# Patient Record
Sex: Female | Born: 1970 | Race: Black or African American | Hispanic: No | Marital: Married | Smoking: Never smoker
Health system: Southern US, Community
[De-identification: ages and names within clinical notes are randomized; demographics above are authoritative.]

## PROBLEM LIST (undated history)

## (undated) DIAGNOSIS — K5909 Other constipation: Secondary | ICD-10-CM

## (undated) DIAGNOSIS — H524 Presbyopia: Secondary | ICD-10-CM

## (undated) DIAGNOSIS — E8881 Metabolic syndrome: Secondary | ICD-10-CM

## (undated) DIAGNOSIS — N6310 Unspecified lump in the right breast, unspecified quadrant: Secondary | ICD-10-CM

## (undated) DIAGNOSIS — R011 Cardiac murmur, unspecified: Secondary | ICD-10-CM

## (undated) DIAGNOSIS — M722 Plantar fascial fibromatosis: Secondary | ICD-10-CM

## (undated) DIAGNOSIS — F329 Major depressive disorder, single episode, unspecified: Secondary | ICD-10-CM

## (undated) DIAGNOSIS — M1990 Unspecified osteoarthritis, unspecified site: Secondary | ICD-10-CM

## (undated) DIAGNOSIS — I10 Essential (primary) hypertension: Secondary | ICD-10-CM

## (undated) DIAGNOSIS — H4010X1 Unspecified open-angle glaucoma, mild stage: Secondary | ICD-10-CM

## (undated) DIAGNOSIS — L732 Hidradenitis suppurativa: Secondary | ICD-10-CM

## (undated) DIAGNOSIS — K436 Other and unspecified ventral hernia with obstruction, without gangrene: Secondary | ICD-10-CM

## (undated) DIAGNOSIS — H52209 Unspecified astigmatism, unspecified eye: Secondary | ICD-10-CM

## (undated) DIAGNOSIS — T7840XA Allergy, unspecified, initial encounter: Secondary | ICD-10-CM

## (undated) DIAGNOSIS — M7120 Synovial cyst of popliteal space [Baker], unspecified knee: Secondary | ICD-10-CM

## (undated) DIAGNOSIS — L680 Hirsutism: Secondary | ICD-10-CM

## (undated) DIAGNOSIS — M797 Fibromyalgia: Secondary | ICD-10-CM

## (undated) DIAGNOSIS — H5210 Myopia, unspecified eye: Secondary | ICD-10-CM

## (undated) DIAGNOSIS — R29818 Other symptoms and signs involving the nervous system: Secondary | ICD-10-CM

## (undated) DIAGNOSIS — R7303 Prediabetes: Secondary | ICD-10-CM

## (undated) DIAGNOSIS — G4733 Obstructive sleep apnea (adult) (pediatric): Secondary | ICD-10-CM

## (undated) DIAGNOSIS — E669 Obesity, unspecified: Secondary | ICD-10-CM

## (undated) DIAGNOSIS — D649 Anemia, unspecified: Secondary | ICD-10-CM

## (undated) DIAGNOSIS — F32A Depression, unspecified: Secondary | ICD-10-CM

## (undated) DIAGNOSIS — H401231 Low-tension glaucoma, bilateral, mild stage: Secondary | ICD-10-CM

## (undated) DIAGNOSIS — I493 Ventricular premature depolarization: Secondary | ICD-10-CM

## (undated) HISTORY — DX: Other constipation: K59.09

## (undated) HISTORY — DX: Plantar fascial fibromatosis: M72.2

## (undated) HISTORY — DX: Major depressive disorder, single episode, unspecified: F32.9

## (undated) HISTORY — DX: Fibromyalgia: M79.7

## (undated) HISTORY — DX: Essential (primary) hypertension: I10

## (undated) HISTORY — DX: Metabolic syndrome: E88.810

## (undated) HISTORY — DX: Other and unspecified ventral hernia with obstruction, without gangrene: K43.6

## (undated) HISTORY — DX: Other symptoms and signs involving the nervous system: R29.818

## (undated) HISTORY — DX: Presbyopia: H52.4

## (undated) HISTORY — DX: Unspecified open-angle glaucoma, mild stage: H40.10X1

## (undated) HISTORY — DX: Ventricular premature depolarization: I49.3

## (undated) HISTORY — DX: Allergy, unspecified, initial encounter: T78.40XA

## (undated) HISTORY — DX: Hidradenitis suppurativa: L73.2

## (undated) HISTORY — DX: Obesity, unspecified: E66.9

## (undated) HISTORY — DX: Cardiac murmur, unspecified: R01.1

## (undated) HISTORY — DX: Depression, unspecified: F32.A

## (undated) HISTORY — DX: Unspecified astigmatism, unspecified eye: H52.209

## (undated) HISTORY — DX: Hirsutism: L68.0

## (undated) HISTORY — DX: Low-tension glaucoma, bilateral, mild stage: H40.1231

## (undated) HISTORY — DX: Metabolic syndrome: E88.81

## (undated) HISTORY — DX: Synovial cyst of popliteal space (Baker), unspecified knee: M71.20

## (undated) HISTORY — DX: Unspecified lump in the right breast, unspecified quadrant: N63.10

## (undated) HISTORY — DX: Myopia, unspecified eye: H52.10

## (undated) HISTORY — DX: Obstructive sleep apnea (adult) (pediatric): G47.33

## (undated) HISTORY — DX: Anemia, unspecified: D64.9

---

## 1991-02-21 HISTORY — PX: TUBAL LIGATION: SHX77

## 2004-11-07 ENCOUNTER — Other Ambulatory Visit: Payer: Self-pay

## 2004-11-07 ENCOUNTER — Emergency Department: Payer: Self-pay | Admitting: Emergency Medicine

## 2004-11-09 ENCOUNTER — Emergency Department: Payer: Self-pay | Admitting: Emergency Medicine

## 2006-11-17 ENCOUNTER — Emergency Department: Payer: Self-pay | Admitting: Emergency Medicine

## 2007-09-12 ENCOUNTER — Ambulatory Visit: Payer: Self-pay

## 2007-09-25 ENCOUNTER — Ambulatory Visit: Payer: Self-pay

## 2008-01-17 ENCOUNTER — Emergency Department: Payer: Self-pay | Admitting: Unknown Physician Specialty

## 2008-04-24 ENCOUNTER — Emergency Department (HOSPITAL_COMMUNITY): Admission: EM | Admit: 2008-04-24 | Discharge: 2008-04-24 | Payer: Self-pay | Admitting: Emergency Medicine

## 2009-05-21 ENCOUNTER — Ambulatory Visit: Payer: Self-pay | Admitting: Gynecologic Oncology

## 2009-05-25 ENCOUNTER — Ambulatory Visit: Payer: Self-pay

## 2009-06-01 ENCOUNTER — Ambulatory Visit: Payer: Self-pay | Admitting: Gynecologic Oncology

## 2009-06-20 ENCOUNTER — Ambulatory Visit: Payer: Self-pay | Admitting: Gynecologic Oncology

## 2009-06-21 ENCOUNTER — Ambulatory Visit: Payer: Self-pay

## 2009-08-18 ENCOUNTER — Ambulatory Visit: Payer: Self-pay | Admitting: Gynecologic Oncology

## 2009-08-24 ENCOUNTER — Ambulatory Visit: Payer: Self-pay | Admitting: Gynecologic Oncology

## 2009-08-24 ENCOUNTER — Inpatient Hospital Stay: Payer: Self-pay

## 2009-08-26 LAB — PATHOLOGY REPORT

## 2009-09-20 ENCOUNTER — Ambulatory Visit: Payer: Self-pay | Admitting: Gynecologic Oncology

## 2009-12-09 DIAGNOSIS — L689 Hypertrichosis, unspecified: Secondary | ICD-10-CM

## 2009-12-09 DIAGNOSIS — I10 Essential (primary) hypertension: Secondary | ICD-10-CM

## 2009-12-09 DIAGNOSIS — E668 Other obesity: Secondary | ICD-10-CM

## 2009-12-09 HISTORY — PX: OOPHORECTOMY: SHX86

## 2010-01-18 ENCOUNTER — Ambulatory Visit: Payer: Self-pay

## 2010-01-18 DIAGNOSIS — N6310 Unspecified lump in the right breast, unspecified quadrant: Secondary | ICD-10-CM

## 2010-01-18 HISTORY — DX: Unspecified lump in the right breast, unspecified quadrant: N63.10

## 2010-10-25 ENCOUNTER — Ambulatory Visit: Payer: Self-pay

## 2011-01-24 ENCOUNTER — Ambulatory Visit: Payer: Self-pay | Admitting: General Practice

## 2011-02-21 ENCOUNTER — Ambulatory Visit: Payer: Self-pay | Admitting: General Practice

## 2011-09-29 DIAGNOSIS — K436 Other and unspecified ventral hernia with obstruction, without gangrene: Secondary | ICD-10-CM

## 2011-09-29 HISTORY — DX: Other and unspecified ventral hernia with obstruction, without gangrene: K43.6

## 2011-10-05 LAB — URINALYSIS, COMPLETE
Bacteria: NONE SEEN
Bilirubin,UR: NEGATIVE
Blood: NEGATIVE
Glucose,UR: 50 mg/dL (ref 0–75)
Ketone: NEGATIVE
Leukocyte Esterase: NEGATIVE
Specific Gravity: 1.019 (ref 1.003–1.030)
Squamous Epithelial: 6
WBC UR: 3 /HPF (ref 0–5)

## 2011-10-05 LAB — LIPASE, BLOOD: Lipase: 106 U/L (ref 73–393)

## 2011-10-05 LAB — CBC
HGB: 11.2 g/dL — ABNORMAL LOW (ref 12.0–16.0)
MCH: 23.7 pg — ABNORMAL LOW (ref 26.0–34.0)
MCHC: 30.7 g/dL — ABNORMAL LOW (ref 32.0–36.0)
MCV: 77 fL — ABNORMAL LOW (ref 80–100)
Platelet: 340 10*3/uL (ref 150–440)
RDW: 17.8 % — ABNORMAL HIGH (ref 11.5–14.5)

## 2011-10-05 LAB — COMPREHENSIVE METABOLIC PANEL
Alkaline Phosphatase: 75 U/L (ref 50–136)
Anion Gap: 6 — ABNORMAL LOW (ref 7–16)
BUN: 10 mg/dL (ref 7–18)
Calcium, Total: 8.6 mg/dL (ref 8.5–10.1)
Co2: 30 mmol/L (ref 21–32)
Creatinine: 0.76 mg/dL (ref 0.60–1.30)
EGFR (African American): >60
EGFR (Non-African Amer.): >60
Glucose: 210 mg/dL — ABNORMAL HIGH (ref 65–99)
Osmolality: 288 (ref 275–301)
Potassium: 3.6 mmol/L (ref 3.5–5.1)
SGPT (ALT): 17 U/L (ref 12–78)

## 2011-10-06 ENCOUNTER — Inpatient Hospital Stay: Payer: Self-pay | Admitting: Surgery

## 2011-10-07 LAB — BASIC METABOLIC PANEL
Anion Gap: 9 (ref 7–16)
BUN: 6 mg/dL — ABNORMAL LOW (ref 7–18)
Co2: 25 mmol/L (ref 21–32)
Creatinine: 0.53 mg/dL — ABNORMAL LOW (ref 0.60–1.30)
EGFR (African American): >60
Glucose: 101 mg/dL — ABNORMAL HIGH (ref 65–99)

## 2011-10-07 LAB — CBC WITH DIFFERENTIAL/PLATELET
Basophil #: 0.1 10*3/uL (ref 0.0–0.1)
Eosinophil %: 3.6 %
Lymphocyte #: 3 10*3/uL (ref 1.0–3.6)
Monocyte %: 7 %
Neutrophil #: 7.6 10*3/uL — ABNORMAL HIGH (ref 1.4–6.5)
Neutrophil %: 63.6 %
RBC: 4.62 10*6/uL (ref 3.80–5.20)
RDW: 17.2 % — ABNORMAL HIGH (ref 11.5–14.5)
WBC: 11.9 10*3/uL — ABNORMAL HIGH (ref 3.6–11.0)

## 2011-10-09 HISTORY — PX: HERNIA REPAIR: SHX51

## 2011-10-09 HISTORY — PX: VENTRAL HERNIA REPAIR: SHX424

## 2011-10-12 LAB — PLATELET COUNT: Platelet: 363 10*3/uL (ref 150–440)

## 2012-07-26 ENCOUNTER — Ambulatory Visit: Payer: Self-pay | Admitting: Hematology and Oncology

## 2012-08-09 ENCOUNTER — Ambulatory Visit: Payer: Self-pay | Admitting: Hematology and Oncology

## 2012-08-09 LAB — CBC CANCER CENTER
Eosinophil #: 0.4 x10 3/mm (ref 0.0–0.7)
Eosinophil %: 3.3 %
HCT: 35.9 % (ref 35.0–47.0)
HGB: 11.7 g/dL — ABNORMAL LOW (ref 12.0–16.0)
MCH: 24.3 pg — ABNORMAL LOW (ref 26.0–34.0)
MCHC: 32.5 g/dL (ref 32.0–36.0)
MCV: 75 fL — ABNORMAL LOW (ref 80–100)
Monocyte #: 0.9 x10 3/mm (ref 0.2–0.9)
Neutrophil #: 6.8 x10 3/mm — ABNORMAL HIGH (ref 1.4–6.5)
Neutrophil %: 58.8 %
Platelet: 373 x10 3/mm (ref 150–440)
RBC: 4.79 10*6/uL (ref 3.80–5.20)

## 2012-08-20 ENCOUNTER — Ambulatory Visit: Payer: Self-pay | Admitting: Hematology and Oncology

## 2012-11-01 ENCOUNTER — Emergency Department: Payer: Self-pay | Admitting: Emergency Medicine

## 2012-11-01 LAB — URINALYSIS, COMPLETE
Glucose,UR: NEGATIVE mg/dL (ref 0–75)
Ketone: NEGATIVE
Nitrite: NEGATIVE
Ph: 5 (ref 4.5–8.0)
Protein: 30
RBC,UR: 2 /HPF (ref 0–5)
Squamous Epithelial: 7
WBC UR: 2 /HPF (ref 0–5)

## 2012-11-01 LAB — COMPREHENSIVE METABOLIC PANEL
Albumin: 3.5 g/dL (ref 3.4–5.0)
Alkaline Phosphatase: 83 U/L (ref 50–136)
Co2: 27 mmol/L (ref 21–32)
Creatinine: 0.62 mg/dL (ref 0.60–1.30)
Osmolality: 273 (ref 275–301)
SGOT(AST): 17 U/L (ref 15–37)
SGPT (ALT): 20 U/L (ref 12–78)
Total Protein: 7.9 g/dL (ref 6.4–8.2)

## 2012-11-01 LAB — CBC
MCH: 24.4 pg — ABNORMAL LOW (ref 26.0–34.0)
MCHC: 31.9 g/dL — ABNORMAL LOW (ref 32.0–36.0)
MCV: 76 fL — ABNORMAL LOW (ref 80–100)
Platelet: 430 10*3/uL (ref 150–440)

## 2012-11-02 LAB — TROPONIN I: Troponin-I: <0.02 ng/mL

## 2013-03-18 ENCOUNTER — Ambulatory Visit: Payer: Self-pay | Admitting: Hematology and Oncology

## 2013-04-14 ENCOUNTER — Ambulatory Visit: Payer: Self-pay | Admitting: Hematology and Oncology

## 2013-11-27 ENCOUNTER — Ambulatory Visit: Payer: Self-pay | Admitting: Family Medicine

## 2013-12-02 ENCOUNTER — Ambulatory Visit: Payer: Self-pay | Admitting: Family Medicine

## 2013-12-02 DIAGNOSIS — G4733 Obstructive sleep apnea (adult) (pediatric): Secondary | ICD-10-CM | POA: Insufficient documentation

## 2014-02-06 LAB — HEPATIC FUNCTION PANEL
ALK PHOS: 63 U/L (ref 25–125)
ALT: 8 U/L (ref 7–35)
AST: 14 U/L (ref 13–35)

## 2014-02-06 LAB — TSH: TSH: 2.07 u[IU]/mL (ref 0.41–5.90)

## 2014-02-06 LAB — BASIC METABOLIC PANEL
BUN: 12 mg/dL (ref 4–21)
Creatinine: 0.7 mg/dL (ref 0.5–1.1)
Glucose: 102 mg/dL
POTASSIUM: 3.8 mmol/L (ref 3.4–5.3)
SODIUM: 142 mmol/L (ref 137–147)

## 2014-02-06 LAB — CBC AND DIFFERENTIAL: Platelets: 327 10*3/uL (ref 150–399)

## 2014-02-27 ENCOUNTER — Ambulatory Visit: Payer: Self-pay | Admitting: Surgery

## 2014-05-14 ENCOUNTER — Ambulatory Visit: Payer: Self-pay | Admitting: Family Medicine

## 2014-05-14 LAB — CBC AND DIFFERENTIAL
HCT: 37 % (ref 36–46)
Hemoglobin: 11.8 g/dL — AB (ref 12.0–16.0)
NEUTROS ABS: 5 /uL
WBC: 8.8 10^3/mL

## 2014-05-15 ENCOUNTER — Ambulatory Visit
Admit: 2014-05-15 | Disposition: A | Discharge status: Home / Self Care | Payer: Self-pay | Attending: Hematology and Oncology | Admitting: Hematology and Oncology

## 2014-06-09 NOTE — H&P (Signed)
Subjective/Chief Complaint abd pain    History of Present Illness lower abd pain last 8 hrs after eating fatty meal. pain not in ruq but lower abd. nausea no emesis no f/c  loose bm earlier today, no flatus    Past History lt ov cyst tubal lig HTN obesity    Past Medical Health Hypertension   Past Med/Surgical Hx:  HYPERTENSION:   OVARIAN MASS:   HTN:   tubal ligation:   ALLERGIES:  No Known Allergies:   Family and Social History:   Family History Non-Contributory    Social History negative tobacco, negative ETOH, LPN    Place of Living Home   Review of Systems:   Fever/Chills No    Cough No    Abdominal Pain Yes    Diarrhea No    Constipation No    Nausea/Vomiting Yes    SOB/DOE No    Chest Pain No    Dysuria No    Tolerating Diet Yes  Nauseated   Physical Exam:   GEN no acute distress, obese    HEENT pink conjunctivae    NECK supple    RESP normal resp effort  no use of accessory muscles    CARD regular rate    ABD positive tenderness  positive hernia  soft  soft abd, min tenderness, partialyy reducible VH with bowel sounds only min tender. no peritoneal signs no overlying erythema    LYMPH negative neck    EXTR negative edema    SKIN normal to palpation    PSYCH alert, A+O to time, place, person, good insight   Lab Results: Hepatic:  15-Aug-13 21:31    Bilirubin, Total 0.2   Alkaline Phosphatase 75   SGPT (ALT) 17   SGOT (AST) 19   Total Protein, Serum 7.5   Albumin, Serum  3.3  Routine Chem:  15-Aug-13 21:31    Glucose, Serum  210   BUN 10   Creatinine (comp) 0.76   Sodium, Serum 142   Potassium, Serum 3.6   Chloride, Serum 106   CO2, Serum 30   Calcium (Total), Serum 8.6   Osmolality (calc) 288   eGFR (African American) >60   eGFR (Non-African American) >60 (eGFR values <9m/min/1.73 m2 may be an indication of chronic kidney disease (CKD). Calculated eGFR is useful in patients with stable renal function. The eGFR  calculation will not be reliable in acutely ill patients when serum creatinine is changing rapidly. It is not useful in  patients on dialysis. The eGFR calculation may not be applicable to patients at the low and high extremes of body sizes, pregnant women, and vegetarians.)   Anion Gap  6   Lipase 106 (Result(s) reported on 05 Oct 2011 at 10:03PM.)  Routine UA:  15-Aug-13 23:15    Color (UA) Yellow   Clarity (UA) Cloudy   Glucose (UA) 50 mg/dL   Bilirubin (UA) Negative   Ketones (UA) Negative   Specific Gravity (UA) 1.019   Blood (UA) Negative   pH (UA) 6.0   Protein (UA) Negative   Nitrite (UA) Negative   Leukocyte Esterase (UA) Negative (Result(s) reported on 05 Oct 2011 at 11:54PM.)   RBC (UA) 2 /HPF   WBC (UA) 3 /HPF   Bacteria (UA) NONE SEEN   Epithelial Cells (UA) 6 /HPF   Mucous (UA) PRESENT (Result(s) reported on 05 Oct 2011 at 11:54PM.)  Routine Hem:  15-Aug-13 21:31    WBC (CBC)  18.8   RBC (CBC)  4.72   Hemoglobin (CBC)  11.2   Hematocrit (CBC) 36.5   Platelet Count (CBC) 340 (Result(s) reported on 05 Oct 2011 at 09:54PM.)   MCV  77   MCH  23.7   MCHC  30.7   RDW  17.8     Assessment/Admission Diagnosis CT rev'd discussed with ED MD and pt and family partially reducible VH with some dilated bowel and full stomach rec admit, NG decompression possible repair of inc VH VTE prophy   Electronic Signatures: Florene Glen (MD)  (Signed 16-Aug-13 01:30)  Authored: CHIEF COMPLAINT and HISTORY, PAST MEDICAL/SURGIAL HISTORY, ALLERGIES, FAMILY AND SOCIAL HISTORY, REVIEW OF SYSTEMS, PHYSICAL EXAM, LABS, ASSESSMENT AND PLAN   Last Updated: 16-Aug-13 01:30 by Florene Glen (MD)

## 2014-06-09 NOTE — Op Note (Signed)
PATIENT NAME:  Kathleen Conley, Kathleen Conley MR#:  [id number] DATE OF BIRTH:  Dec 08, 1970  DATE OF PROCEDURE:  10/09/2011  PREOPERATIVE DIAGNOSIS: Ventral hernia.   POSTOPERATIVE DIAGNOSIS: Ventral hernia.   PROCEDURE: Ventral hernia repair.   SURGEON: Quentin Orealph L. [name], M.D.   ASSISTANLorenda Peck: [employer] student  ANESTHESIA: General.   DESCRIPTION OF PROCEDURE: With the patient in the supine position after the induction of appropriate general anesthesia a right internal jugular IV line was placed under ultrasound guidance. The patient did not have adequate IV access for a procedure of this magnitude. Following successful placement of the central line, the abdomen was prepped with Chlor-Prep and prepped and draped in sterile towels. A midline incision was made just above the umbilicus through the site of the previous incision down to the suprapubic area. The incision was carried down through the subcutaneous tissue with Bovie electrocautery. There was a very large hernia sac with three separate hernia defects identified. The sac was dissected back to its base along the abdominal wall. The sac was then removed in total. There were no significant adhesions to the anterior abdominal wall or to the sac from the abdomen or from the omentum. The edges were cleaned along with the fascia. The defect measured 10 x 8 cm. A piece of 12 x 12 mesh was brought to the table and placed on a angle. A C-Qur mesh was used for the repair. A vertical mattress suture of 0 Prolene allowed for underlapping of the fascia by approximately 3 cm in a circumferential fashion. Once the mesh was satisfactorily in place, the mesh was covered with fascia using interrupted figure-of-eight sutures of 0 Prolene. The repair appeared to be satisfactory. 10 mm flat Jackson-Pratt drains were placed through two separate stab wounds. The area was irrigated copiously. The drain was secured with 4-0 nylon. The umbilicus was replaced along the fascia with 0 Vicryl  and the skin was clipped. A compressive dressing and an     abdominal binder were placed. The patient was then returned to the recovery room having tolerated the procedure well. Sponge, instrument, and needle counts were correct x2 in the Operating Room.   ____________________________ Quentin Orealph L. [name] III, MD rle:slb D: 10/09/2011 15:13:13 ET T: 10/09/2011 17:11:06 ET JOB#: [id number]  cc: Carmie Endalph L. [name] III, MD, <Dictator> Quentin OreALPH L [name] MD ELECTRONICALLY SIGNED 10/11/2011 18:20

## 2014-06-09 NOTE — Discharge Summary (Signed)
PATIENT NAME:  Kathleen Conley, Kathleen Conley MR#:  [id number] DATE OF BIRTH:  01/15/1971  DATE OF ADMISSION:  10/06/2011 DATE OF DISCHARGE:  10/13/2011  BRIEF HISTORY: Ms. Kathleen Roussellizabeth Fuller is a 44 year old woman with a known ventral hernia admitted through the Emergency Room on the evening of 10/06/2011 with a large incarcerated ventral hernia. She had had a previous GYN procedure in the lower midline and multiple known hernia defects and developed an incarceration. The incarceration was reduced in the Emergency Room and there did not appear to be any evidence of strangulation. However, because she had been so symptomatic surgery was recommended. She was taken to surgery on the morning of 10/09/2011 where she underwent a ventral hernia repair. The procedure was uncomplicated. She had no significant intraoperative problems. Mesh repair was accomplished. Drains were placed. She had very slow return of bowel function but has gradually improved over the last 48 hours. Drainage has decreased from her JP drainage. Her wounds look good with no sign of any infection. She is discharged home today to be followed in the office in 3 to 5 days time for drain evaluation and possible removal.   DISCHARGE MEDICATIONS: 1. Metoprolol 50 mg p.o. twice a day. 2. Vicodin 1 to 2 every six hours p.r.n. pain.   FINAL DISCHARGE DIAGNOSIS: Incarcerated ventral hernia.   SURGERY: Ventral hernia repair. ____________________________ Carmie Endalph L. [name] III, MD rle:slb D: 10/13/2011 09:51:56 ET    T: 10/13/2011 11:23:38 ET       JOB#: [id number] cc: Quentin Orealph L. [name] III, MD, <Dictator> Quentin OreALPH L [name] MD ELECTRONICALLY SIGNED 10/15/2011 7:05

## 2014-06-09 NOTE — H&P (Signed)
PATIENT NAME:  Kathleen Conley, Kathleen Conley MR#:  [id number] DATE OF BIRTH:  1970-02-25  DATE OF ADMISSION:  10/06/2011  CHIEF COMPLAINT: Abdominal pain.   HISTORY OF PRESENT ILLNESS: This is a patient who started having abdominal pain approximately eight hours ago. It came on after she ate a fatty meal but she describes no right upper quadrant pain. She points to an area in the periumbilical area only. She has nausea but no vomiting. She had a normal to loose bowel movement earlier today on arrival to the ED. She has passed no flatus since. She has never had an episode like this before, did not know that she had a hernia.   PAST MEDICAL HISTORY: Hypertension.   PAST SURGICAL HISTORY:  1. Left ovarian cystectomy.  2. Tubal ligation.   SOCIAL HISTORY: Patient works as an Public house managerLPN. She does not smoke or drink.   FAMILY HISTORY: Noncontributory.   ALLERGIES: None.   MEDICATIONS: Metoprolol 50 mg p.o. daily.   REVIEW OF SYSTEMS: 10 system review has been performed and is negative with the exception of that mentioned in the history of present illness.   PHYSICAL EXAMINATION:  GENERAL: Morbidly obese female patient, BMI of 45, 246 pounds, 62 inches tall. She appears in no acute distress and appears quite comfortable.   HEENT: No scleral icterus.   NECK: No palpable neck nodes.   CHEST: Clear to auscultation.   CARDIAC: Regular rate and rhythm.   ABDOMEN: Soft, nondistended, minimally tender, mostly tender around a partially reducible periumbilical ventral hernia. When manipulating the hernia bowel sounds are heard and it is partially reducible, soft without overlying erythema.   EXTREMITIES: Without edema. Calves are nontender.   NEUROLOGIC: Grossly intact.   INTEGUMENT: No jaundice.   LABORATORY, DIAGNOSTIC, AND RADIOLOGICAL DATA: CT scan is personally reviewed suggesting an incarcerated ventral hernia with bowel present. Stomach is dilated and fluid filled as are proximal bowel loops.   White  blood cell count 18.8, hemoglobin and hematocrit 11 and 36, electrolytes are within normal limits, glucose elevated at 210.   ASSESSMENT AND PLAN: This is a patient with an incarcerated ventral hernia. It is partially reducible and very minimally tender. Certainly no peritoneal signs and no signs of obvious bowel compromise. My plan would be to place this patient in the hospital, start IV fluids, place a nasogastric tube to decompress the bowel proximally and then discuss surgical intervention in the next day or two based on her symptoms. This is a hernia that will likely require repair but does not require emergency repair this evening due to the fact that it is only minimally tender and shows no sign of compromise.  ____________________________ Adah Salvageichard E. Excell Seltzerooper, MD rec:cms D: 10/06/2011 01:58:39 ET T: 10/06/2011 08:23:27 ET JOB#: [id number]  cc: Adah Salvageichard E. Excell Seltzerooper, MD, <Dictator>  Lattie HawICHARD E Cleo Villamizar MD ELECTRONICALLY SIGNED 10/10/2011 15:34

## 2014-08-17 ENCOUNTER — Encounter: Payer: Self-pay | Admitting: Family Medicine

## 2014-08-17 DIAGNOSIS — F330 Major depressive disorder, recurrent, mild: Secondary | ICD-10-CM | POA: Insufficient documentation

## 2014-08-17 DIAGNOSIS — K5909 Other constipation: Secondary | ICD-10-CM | POA: Insufficient documentation

## 2014-08-17 DIAGNOSIS — R011 Cardiac murmur, unspecified: Secondary | ICD-10-CM | POA: Insufficient documentation

## 2014-08-17 DIAGNOSIS — F325 Major depressive disorder, single episode, in full remission: Secondary | ICD-10-CM | POA: Insufficient documentation

## 2014-08-17 DIAGNOSIS — I493 Ventricular premature depolarization: Secondary | ICD-10-CM | POA: Insufficient documentation

## 2014-08-17 DIAGNOSIS — Z789 Other specified health status: Secondary | ICD-10-CM | POA: Insufficient documentation

## 2014-08-17 DIAGNOSIS — R29818 Other symptoms and signs involving the nervous system: Secondary | ICD-10-CM | POA: Insufficient documentation

## 2014-08-17 DIAGNOSIS — M722 Plantar fascial fibromatosis: Secondary | ICD-10-CM | POA: Insufficient documentation

## 2014-08-17 DIAGNOSIS — E8881 Metabolic syndrome: Secondary | ICD-10-CM | POA: Insufficient documentation

## 2014-08-17 DIAGNOSIS — D509 Iron deficiency anemia, unspecified: Secondary | ICD-10-CM | POA: Insufficient documentation

## 2014-08-17 DIAGNOSIS — N920 Excessive and frequent menstruation with regular cycle: Secondary | ICD-10-CM | POA: Insufficient documentation

## 2014-08-17 DIAGNOSIS — M7120 Synovial cyst of popliteal space [Baker], unspecified knee: Secondary | ICD-10-CM | POA: Insufficient documentation

## 2014-08-17 DIAGNOSIS — L732 Hidradenitis suppurativa: Secondary | ICD-10-CM | POA: Insufficient documentation

## 2014-08-17 DIAGNOSIS — J309 Allergic rhinitis, unspecified: Secondary | ICD-10-CM | POA: Insufficient documentation

## 2014-08-17 DIAGNOSIS — M797 Fibromyalgia: Secondary | ICD-10-CM | POA: Insufficient documentation

## 2014-08-20 ENCOUNTER — Encounter: Payer: Self-pay | Admitting: Family Medicine

## 2014-08-20 ENCOUNTER — Ambulatory Visit (INDEPENDENT_AMBULATORY_CARE_PROVIDER_SITE_OTHER): Payer: Self-pay | Admitting: Family Medicine

## 2014-08-20 VITALS — BP 118/76 | HR 110 | Temp 98.9°F | Resp 16 | Ht 62.0 in | Wt 235.2 lb

## 2014-08-20 DIAGNOSIS — F329 Major depressive disorder, single episode, unspecified: Secondary | ICD-10-CM

## 2014-08-20 DIAGNOSIS — F32A Depression, unspecified: Secondary | ICD-10-CM

## 2014-08-20 DIAGNOSIS — N6452 Nipple discharge: Secondary | ICD-10-CM

## 2014-08-20 DIAGNOSIS — M797 Fibromyalgia: Secondary | ICD-10-CM

## 2014-08-20 MED ORDER — DULOXETINE HCL 60 MG PO CPEP: 60.0000 mg | ORAL_CAPSULE | Freq: Every day | ORAL | Status: DC

## 2014-08-20 NOTE — Progress Notes (Signed)
Name: Kathleen Conley   MRN: [id number]    DOB: 05-04-1970   Date:08/20/2014       Progress Note  Subjective  Chief Complaint  Chief Complaint  Patient presents with  . Follow-up    1 month  . Fibromyalgia    Patient is currently on Cymbalta and this is helping.   . Breast Problem    Bleeding from right nipple last week     HPI  Nippler Discharge: she notice some blood on her bra and shirt and also some dropped in her sink for three days last week. It was also painful. Symptoms resolved by itself. No fever, no redness on her breast, no trauma. She was seen by Dr. [name] about 6 months ago for breast cyst and had a US guided needle aspiration done at [facility]. No personal history of breast cancer, but her paternal grandmother died of breast cancer in her 59's.  FMS: she started Cymbalta one month ago. She is doing well with medication, pain level without pressure is zero. She also noticed increase in energy level and mental fogginess has improved  Depression: She still misses not having her daughter at home, but medication has improved her mood, she has more energy and motivation, no side effects of medication   Patient Active Problem List   Diagnosis Date Noted  . Allergic rhinitis 08/17/2014  . Axillary hidradenitis suppurativa 08/17/2014  . Baker cyst 08/17/2014  . Chronic constipation 08/17/2014  . Clinical depression 08/17/2014  . Engages in travel abroad 08/17/2014  . Fibromyalgia 08/17/2014  . Cardiac murmur 08/17/2014  . Anemia, iron deficiency 08/17/2014  . Excessive and frequent menstruation 08/17/2014  . Dysmetabolic syndrome 08/17/2014  . Plantar fasciitis 08/17/2014  . Beat, premature ventricular 08/17/2014  . 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 Obstructive apnea 12/02/2013  . Essential (primary) hypertension 12/09/2009  . Hairiness 12/09/2009  . Extreme obesity 12/09/2009    Past Surgical History  Procedure  Laterality Date  . Oophorectomy Left 12/09/2009  . Tubal ligation  1993  . Ventral hernia repair  10/09/2011    Family History  Problem Relation Age of Onset  . Breast cancer Paternal Grandmother   . Heart disease Father   . Hypertension Father   . Diabetes Father   . Hypertension Mother   . CVA Mother   . Kidney disease Mother   . Congenital heart disease Mother   . Colon cancer Maternal Grandfather   . Colon cancer Maternal Uncle     History   Social History  . Marital Status: Single    Spouse Name: N/A  . Number of Children: 3  . Years of Education: Associates   Occupational History  . LPN at the [employer]    Social History Main Topics  . Smoking status: Never Smoker   . Smokeless tobacco: Not on file  . Alcohol Use: No  . Drug Use: No  . Sexual Activity: Yes   Other Topics Concern  . Not on file   Social History Narrative     Current outpatient prescriptions:  .  cetirizine (ZYRTEC) 10 MG tablet, Take 1 tablet by mouth daily., Disp: , Rfl:  .  Cholecalciferol (VITAMIN D) 2000 UNITS tablet, Take 1 tablet by mouth daily., Disp: , Rfl:  .  cyclobenzaprine (FLEXERIL) 5 MG tablet, Take 1 tablet by mouth at bedtime as needed., Disp: , Rfl:  .  docusate sodium (COLACE) 100 MG capsule, Take 1 tablet by  mouth as needed., Disp: , Rfl:  .  ferrous sulfate 325 (65 FE) MG tablet, Take 1 tablet by mouth daily., Disp: , Rfl:  .  fluticasone (FLONASE) 50 MCG/ACT nasal spray, Place 2 sprays into the nose daily., Disp: , Rfl:  .  levonorgestrel (MIRENA, 52 MG,) 20 MCG/24HR IUD, 1 each by Intrauterine route., Disp: , Rfl:  .  meloxicam (MOBIC) 15 MG tablet, Take 1 tablet by mouth daily., Disp: , Rfl:  .  montelukast (SINGULAIR) 10 MG tablet, Take 1 tablet by mouth daily., Disp: , Rfl:  .  Olmesartan-Amlodipine-HCTZ (TRIBENZOR) 20-5-12.5 MG TABS, Take 1 tablet by mouth daily., Disp: , Rfl:   No Known Allergies   ROS  Constitutional: Negative for fever or  weight change.  Respiratory: Negative for cough and shortness of breath.   Cardiovascular: Negative for chest pain or palpitations.  Gastrointestinal: Negative for abdominal pain, no bowel changes.  Musculoskeletal: Negative for gait problem or joint swelling.  Skin: Negative for rash.  Neurological: Negative for dizziness or headache.  No other specific complaints in a complete review of systems (except as listed in HPI above).  Objective  Filed Vitals:   08/20/14 1344  BP: 118/76  Pulse: 110  Temp: 98.9 F (37.2 C)  TempSrc: Oral  Resp: 16  Height: 5\' 2"  (1.575 m)  Weight: 235 lb 3.2 oz (106.686 kg)  SpO2: 97%    Body mass index is 43.01 kg/(m^2).  Physical Exam  Constitutional: Patient appears well-developed and well-nourished. No distress.  Eyes:  No scleral icterus. Strabismus Neck: Normal range of motion. Neck supple. Cardiovascular: Normal rate, regular rhythm and normal heart sounds.  No murmur heard. No BLE edema. Breast: no nipple discharge, no masses or tenderness during exam Pulmonary/Chest: Effort normal and breath sounds normal. No respiratory distress. Abdominal: Soft.  There is no tenderness. Psychiatric: Patient has a normal mood and affect. behavior is normal. Judgment and thought content normal.    PHQ2/9: Depression screen PHQ 2/9 08/20/2014  Decreased Interest 1  Down, Depressed, Hopeless 0  PHQ - 2 Score 1     Fall Risk: Fall Risk  08/20/2014  Falls in the past year? No    Assessment & Plan  1. Nipple discharge, bloody  [facility]  2. Fibromyalgia Doing well, she felt too sleep with Cyclobenzaprine, advised to take half pill around 6 pm and see if she can tolerate it better, doing well on Cymbalta  3. Clinical depression Doing better, continue medication

## 2014-08-27 ENCOUNTER — Ambulatory Visit: Payer: Self-pay | Admitting: Surgery

## 2014-09-17 ENCOUNTER — Ambulatory Visit: Payer: Self-pay | Admitting: Surgery

## 2014-10-09 ENCOUNTER — Ambulatory Visit (INDEPENDENT_AMBULATORY_CARE_PROVIDER_SITE_OTHER): Payer: 59 | Admitting: Surgery

## 2014-10-09 ENCOUNTER — Encounter: Payer: Self-pay | Admitting: Surgery

## 2014-10-09 VITALS — BP 143/84 | HR 91 | Temp 98.5°F | Ht 62.0 in | Wt 236.0 lb

## 2014-10-09 DIAGNOSIS — N6452 Nipple discharge: Secondary | ICD-10-CM

## 2014-10-09 NOTE — Progress Notes (Signed)
Surgical Consultation  10/09/2014  Kathleen Conley is an 44 y.o. female.   Chief Complaint  Patient presents with  . Breast Discharge    Right Breast- x 3 months- Serosanginous     HPI: She's had 2 episodes of nipple drainage on the right breast. She noted some spots of blood on her bra and clothing several months ago and then another episode a couple of weeks ago. She's had no other significant symptoms. In January she had 2 cysts aspirated in the right breast but denies any other significant breast history. She's had no further workup since January.  Past Medical History  Diagnosis Date  . Hypertension   . PVC (premature ventricular contraction)   . Allergy   . Obstructive sleep apnea   . Chronic constipation   . Hydradenitis   . Baker cyst   . Fibromyalgia   . Hairiness   . Depression   . Plantar fasciitis   . Cardiac murmur   . Anemia     Iron Deficiency  . Dysmetabolic syndrome   . Obesity   . 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 Incarcerated ventral hernia 09/29/2011  . Breast mass, right 01/18/2010    Past Surgical History  Procedure Laterality Date  . Oophorectomy Left 12/09/2009  . Ventral hernia repair  10/09/2011  . Hernia repair  10/09/11    Ventral Incarcerated- Dr. [name]  . Oophorectomy Left   . Tubal ligation  1993    Family History  Problem Relation Age of Onset  . Breast cancer Paternal Grandmother   . Heart disease Father   . Hypertension Father   . Diabetes Father   . Hypertension Mother   . CVA Mother   . Kidney disease Mother   . Congenital heart disease Mother   . Colon cancer Maternal Grandfather   . Colon cancer Maternal Uncle     Social History:  reports that she has never smoked. She has never used smokeless tobacco. She reports that she does not drink alcohol or use illicit drugs.  Allergies: No Known Allergies  Medications reviewed.     Review of Systems  Constitutional: Negative.    HENT: Negative.   Eyes: Negative.   Respiratory: Negative.   Cardiovascular: Negative.   Gastrointestinal: Negative.   Genitourinary: Negative.   Musculoskeletal: Negative.   Skin: Negative.   Neurological: Negative.   Psychiatric/Behavioral: Negative.        BP 143/84 mmHg  Pulse 91  Temp(Src) 98.5 F (36.9 C) (Oral)  Ht  (1.575 m)  Wt 236 lb (107.049 kg)  BMI 43.15 kg/m2  Physical Exam  Constitutional: She is oriented to person, place, and time and well-developed, well-nourished, and in no distress.  HENT:  Head: Normocephalic and atraumatic.  Eyes: Conjunctivae are normal. Pupils are equal, round, and reactive to light.  Neck: Normal range of motion. Neck supple.  Cardiovascular: Regular rhythm and normal heart sounds.   Pulmonary/Chest: Effort normal and breath sounds normal.  Abdominal: Soft. Bowel sounds are normal.  Musculoskeletal: Normal range of motion. She exhibits no edema.  Neurological: She is alert and oriented to person, place, and time.  Skin: Skin is warm and dry.  Psychiatric: Mood and judgment normal.   on breast exam she has no palpable nodule in either nipple and no palpable dominant nodule in either breast. She has some mild reactive axillary abscesses similar to hidradenitis. She did have some milky drainage from the right nipple but I  cannot identify a duct with bloody drainage.    No results found for this or any previous visit (from the past 48 hour(s)). No results found.  Assessment/Plan: 1. Nipple discharge I have reviewed her most recent mammograms. There is no evidence of a significant ductal abnormality. We would expect her to likely have a papilloma causing the symptoms in view of a negative mammogram. At this point she is due for mammography in January. I think we'll simply follow her along and see how she does over the next several months. I have offered her nipple exploration she seems comfortable with the current plan since the  bleeding has been minimal. We'll see her back first of the year after her updated mammograms if she continues to have symptoms. She is in agreement   Tiney Rouge III dermatitis

## 2014-10-09 NOTE — Patient Instructions (Signed)
Please continue with your yearly mammograms. You will be due next in January 2017.  Please call our office with any questions or concerns.

## 2014-10-27 ENCOUNTER — Encounter: Payer: Self-pay | Admitting: Family Medicine

## 2014-10-27 DIAGNOSIS — Z8719 Personal history of other diseases of the digestive system: Secondary | ICD-10-CM | POA: Insufficient documentation

## 2014-10-27 DIAGNOSIS — Z9889 Other specified postprocedural states: Secondary | ICD-10-CM

## 2014-11-20 ENCOUNTER — Ambulatory Visit (INDEPENDENT_AMBULATORY_CARE_PROVIDER_SITE_OTHER): Payer: Self-pay | Admitting: Family Medicine

## 2014-11-20 ENCOUNTER — Encounter: Payer: Self-pay | Admitting: Family Medicine

## 2014-11-20 VITALS — BP 128/88 | HR 78 | Temp 98.8°F | Resp 16 | Ht 62.0 in | Wt 234.0 lb

## 2014-11-20 DIAGNOSIS — Z13220 Encounter for screening for lipoid disorders: Secondary | ICD-10-CM

## 2014-11-20 DIAGNOSIS — M797 Fibromyalgia: Secondary | ICD-10-CM

## 2014-11-20 DIAGNOSIS — F329 Major depressive disorder, single episode, unspecified: Secondary | ICD-10-CM

## 2014-11-20 DIAGNOSIS — R5383 Other fatigue: Secondary | ICD-10-CM

## 2014-11-20 DIAGNOSIS — F330 Major depressive disorder, recurrent, mild: Secondary | ICD-10-CM

## 2014-11-20 DIAGNOSIS — F32A Depression, unspecified: Secondary | ICD-10-CM

## 2014-11-20 DIAGNOSIS — E8881 Metabolic syndrome: Secondary | ICD-10-CM

## 2014-11-20 DIAGNOSIS — I10 Essential (primary) hypertension: Secondary | ICD-10-CM

## 2014-11-20 DIAGNOSIS — M1712 Unilateral primary osteoarthritis, left knee: Secondary | ICD-10-CM

## 2014-11-20 DIAGNOSIS — G4733 Obstructive sleep apnea (adult) (pediatric): Secondary | ICD-10-CM

## 2014-11-20 DIAGNOSIS — R011 Cardiac murmur, unspecified: Secondary | ICD-10-CM

## 2014-11-20 DIAGNOSIS — Z23 Encounter for immunization: Secondary | ICD-10-CM

## 2014-11-20 DIAGNOSIS — G4726 Circadian rhythm sleep disorder, shift work type: Secondary | ICD-10-CM

## 2014-11-20 DIAGNOSIS — D509 Iron deficiency anemia, unspecified: Secondary | ICD-10-CM

## 2014-11-20 MED ORDER — MELOXICAM 15 MG PO TABS: 15.0000 mg | ORAL_TABLET | Freq: Every day | ORAL | Status: DC

## 2014-11-20 MED ORDER — ARMODAFINIL 150 MG PO TABS: 150.0000 mg | ORAL_TABLET | Freq: Every day | ORAL | Status: DC

## 2014-11-20 MED ORDER — PREGABALIN 50 MG PO CAPS: 50.0000 mg | ORAL_CAPSULE | Freq: Three times a day (TID) | ORAL | Status: DC

## 2014-11-20 MED ORDER — DULOXETINE HCL 60 MG PO CPEP: 60.0000 mg | ORAL_CAPSULE | Freq: Every day | ORAL | Status: DC

## 2014-11-20 MED ORDER — METAXALONE 800 MG PO TABS: 800.0000 mg | ORAL_TABLET | Freq: Three times a day (TID) | ORAL | Status: DC

## 2014-11-20 NOTE — Progress Notes (Signed)
Name: Kathleen Conley   MRN: [id number]    DOB: Jun 17, 1970   Date:11/20/2014       Progress Note  Subjective  Chief Complaint  Chief Complaint  Patient presents with  . Medication Refill    follow-up  . Hypertension  . Fibromyalgia    worsening  . Allergic Rhinitis     HPI  FMS: she has noticed worsening of symptoms over the past couple of weeks. Feeling more tired, and also the pain at the end of her work shift she is very sore. She also has occasional mental fogginess. The pain is described as sore/aching pain worse on shoulder and lower back. Taking Cymbalta, Meloxicam and prn Cyclobenzaprine .   HTN: taking bp as prescribed, bp is at goal. 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 She feels very tired while working 3rd shift and has to drink coffee to stay alert. 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 Denies crying spells. Compliant with medicaiton   Fatigue: she is always feeling tired and sleepy  Patient Active Problem List   Diagnosis Date Noted  . Osteoarthritis of left knee 11/20/2014  . History of ventral hernia repair 10/27/2014  . Allergic rhinitis 08/17/2014  . Axillary hidradenitis suppurativa 08/17/2014  . Baker cyst 08/17/2014  . Chronic constipation 08/17/2014  . Depression, major, recurrent, mild 08/17/2014  . Engages in travel abroad 08/17/2014  . Fibromyalgia 08/17/2014  . Cardiac murmur 08/17/2014  . Anemia, iron deficiency 08/17/2014  . Excessive and frequent menstruation 08/17/2014  . Dysmetabolic syndrome 08/17/2014  . Plantar fasciitis 08/17/2014  . Beat, premature ventricular 08/17/2014  . 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 Obstructive  apnea 12/02/2013  . Essential (primary) hypertension 12/09/2009  . Hypertrichosis 12/09/2009  . Extreme obesity 12/09/2009    Past Surgical History  Procedure Laterality Date  . Oophorectomy Left 12/09/2009  . Ventral hernia repair  10/09/2011  . Hernia repair  10/09/11    Ventral Incarcerated- Dr. [name]  . Oophorectomy Left   . Tubal ligation  1993    Family History  Problem Relation Age of Onset  . Breast cancer Paternal Grandmother   . Heart disease Father   . Hypertension Father   . Diabetes Father   . Hypertension Mother   . CVA Mother   . Kidney disease Mother   . Congenital heart disease Mother   . Colon cancer Maternal Grandfather   . Colon cancer Maternal Uncle     Social History   Social History  . Marital Status: Single    Spouse Name: N/A  . Number of Children: 3  . Years of Education: Associates   Occupational History  . 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 Smoking status: Never Smoker   . Smokeless tobacco: Never Used  . Alcohol Use: No  . Drug Use: No  . Sexual Activity: Yes    Birth Control/ Protection: IUD   Other Topics Concern  . Not on file   Social History Narrative     Current outpatient prescriptions:  .  Armodafinil (NUVIGIL) 150 MG tablet, Take 1 tablet (150 mg total) by mouth daily., Disp: 30 tablet, Rfl:  2 .  cetirizine (ZYRTEC) 10 MG tablet, Take 1 tablet by mouth daily., Disp: , Rfl:  .  Cholecalciferol (VITAMIN D) 2000 UNITS tablet, Take 1 tablet by mouth daily., Disp: , Rfl:  .  cyclobenzaprine (FLEXERIL) 5 MG tablet, Take 1 tablet by mouth at bedtime as needed., Disp: , Rfl:  .  docusate sodium (COLACE) 100 MG capsule, Take 1 tablet by mouth as needed., Disp: , Rfl:  .  DULoxetine (CYMBALTA) 60 MG capsule, Take 1 capsule (60 mg total) by mouth daily., Disp: 90 capsule, Rfl: 1 .  ferrous sulfate 325 (65 FE) MG tablet, Take 1 tablet by mouth daily., Disp: , Rfl:  .  fluticasone (FLONASE) 50 MCG/ACT  nasal spray, Place 2 sprays into the nose daily., Disp: , Rfl:  .  levonorgestrel (MIRENA, 52 MG,) 20 MCG/24HR IUD, 1 each by Intrauterine route., Disp: , Rfl:  .  meloxicam (MOBIC) 15 MG tablet, Take 1 tablet (15 mg total) by mouth daily., Disp: 90 tablet, Rfl: 1 .  montelukast (SINGULAIR) 10 MG tablet, Take 1 tablet by mouth daily., Disp: , Rfl:  .  Olmesartan-Amlodipine-HCTZ (TRIBENZOR) 20-5-12.5 MG TABS, Take 1 tablet by mouth daily., Disp: , Rfl:  .  pregabalin (LYRICA) 50 MG capsule, Take 1 capsule (50 mg total) by mouth 3 (three) times daily. Titrate slowly, either increase the pm dose to 150 mg or try three times daily, Disp: 90 capsule, Rfl: 2  No Known Allergies   ROS  Constitutional: Negative for fever or significant weight change.  Respiratory: Negative for cough and shortness of breath.   Cardiovascular: Negative for chest pain or palpitations.  Gastrointestinal: Negative for abdominal pain, no bowel changes.  Musculoskeletal: Negative for gait problem or joint swelling.  Skin: Negative for rash.  Neurological: Negative for dizziness or headache.  No other specific complaints in a complete review of systems (except as listed in HPI above).  Objective  Filed Vitals:   11/20/14 1340  BP: 128/88  Pulse: 78  Temp: 98.8 F (37.1 C)  TempSrc: Oral  Resp: 16  Height:  (1.575 m)  Weight: 234 lb (106.142 kg)  SpO2: 96%    Body mass index is 42.79 kg/(m^2).  Physical Exam   Constitutional: Patient appears well-developed and well-nourished. Obese  distress.  HEENT: head atraumatic, normocephalic, pupils equal and reactive to light, neck supple, throat within normal limits Cardiovascular: Normal rate, regular rhythm and normal heart sounds.  Systolic ejection  murmur heard. No BLE edema. Pulmonary/Chest: Effort normal and breath sounds normal. No respiratory distress. Abdominal: Soft.  There is no tenderness. Psychiatric: Patient has a normal mood and affect.  behavior is normal. Judgment and thought content normal. Muscular Skeletal: positive trigger points  PHQ2/9: Depression screen [facility] 2/9 11/20/2014 08/20/2014  Decreased Interest 0 1  Down, Depressed, Hopeless 0 0  PHQ - 2 Score 0 1     Fall Risk: Fall Risk  11/20/2014 08/20/2014  Falls in the past year? No No     Functional Status Survey: Is the patient deaf or have difficulty hearing?: No Does the patient have difficulty seeing, even when wearing glasses/contacts?: Yes (glasses) Does the patient have difficulty concentrating, remembering, or making decisions?: No Does the patient have difficulty walking or climbing stairs?: No Does the patient have difficulty dressing or bathing?: No   Assessment & Plan  1. Fibromyalgia  We will add Lyrica, explained side effects - DULoxetine (CYMBALTA) 60 MG capsule; Take 1 capsule (60 mg total) by mouth  daily.  Dispense: 90 capsule; Refill: 1 - pregabalin (LYRICA) 50 MG capsule; Take 1 capsule (50 mg total) by mouth 3 (three) times daily. Titrate slowly, either increase the pm dose to 150 mg or try three times daily  Dispense: 90 capsule; Refill: 2  2. Needs flu shot  - Flu Vaccine QUAD 36+ mos PF IM (Fluarix & Fluzone Quad PF) - she will have it at work   3. Essential (primary) hypertension  - Comprehensive metabolic panel  4. Obstructive apnea  - Armodafinil (NUVIGIL) 150 MG tablet; Take 1 tablet (150 mg total) by mouth daily.  Dispense: 30 tablet; Refill: 2  5. Depression, major, recurrent, mild  Doing well, continue Duloxetine  6. Dysmetabolic syndrome  - Hemoglobin A1c  7. Anemia, iron deficiency  - CBC with Differential/Platelet - Ferritin  8. Clinical depression  - DULoxetine (CYMBALTA) 60 MG capsule; Take 1 capsule (60 mg total) by mouth daily.  Dispense: 90 capsule; Refill: 1  9. Primary osteoarthritis of left knee  - meloxicam (MOBIC) 15 MG tablet; Take 1 tablet (15 mg total) by mouth daily.  Dispense: 90  tablet; Refill: 1  10. Other fatigue  - Vit D  25 hydroxy (rtn osteoporosis monitoring) - Vitamin B12 - TSH  11. 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  Dispense: 30 tablet; Refill: 2  13. Cardiac murmur  - Echocardiogram; Future

## 2014-11-27 LAB — CBC WITH DIFFERENTIAL/PLATELET
BASOS ABS: 0 10*3/uL (ref 0.0–0.2)
Basos: 0 %
EOS (ABSOLUTE): 0.4 10*3/uL (ref 0.0–0.4)
Eos: 4 %
Hematocrit: 38 % (ref 34.0–46.6)
Hemoglobin: 11.9 g/dL (ref 11.1–15.9)
IMMATURE GRANS (ABS): 0 10*3/uL (ref 0.0–0.1)
IMMATURE GRANULOCYTES: 0 %
LYMPHS: 36 %
Lymphocytes Absolute: 4.4 10*3/uL — ABNORMAL HIGH (ref 0.7–3.1)
MCH: 26 pg — ABNORMAL LOW (ref 26.6–33.0)
MCHC: 31.3 g/dL — ABNORMAL LOW (ref 31.5–35.7)
MCV: 83 fL (ref 79–97)
Monocytes Absolute: 1 10*3/uL — ABNORMAL HIGH (ref 0.1–0.9)
Monocytes: 8 %
NEUTROS PCT: 52 %
Neutrophils Absolute: 6.4 10*3/uL (ref 1.4–7.0)
Platelets: 363 10*3/uL (ref 150–379)
RBC: 4.57 x10E6/uL (ref 3.77–5.28)
RDW: 16.9 % — ABNORMAL HIGH (ref 12.3–15.4)
WBC: 12.3 10*3/uL — ABNORMAL HIGH (ref 3.4–10.8)

## 2014-11-27 LAB — COMPREHENSIVE METABOLIC PANEL
ALT: 14 IU/L (ref 0–32)
AST: 14 IU/L (ref 0–40)
Albumin/Globulin Ratio: 1.6 (ref 1.1–2.5)
Albumin: 4.1 g/dL (ref 3.5–5.5)
Alkaline Phosphatase: 75 IU/L (ref 39–117)
BUN / CREAT RATIO: 16 (ref 9–23)
BUN: 9 mg/dL (ref 6–24)
Bilirubin Total: 0.3 mg/dL (ref 0.0–1.2)
CALCIUM: 9.1 mg/dL (ref 8.7–10.2)
CO2: 26 mmol/L (ref 18–29)
CREATININE: 0.55 mg/dL — AB (ref 0.57–1.00)
Chloride: 103 mmol/L (ref 97–108)
GFR, EST AFRICAN AMERICAN: 131 mL/min/{1.73_m2} (ref >59)
GFR, EST NON AFRICAN AMERICAN: 114 mL/min/{1.73_m2} (ref >59)
GLOBULIN, TOTAL: 2.6 g/dL (ref 1.5–4.5)
GLUCOSE: 101 mg/dL — AB (ref 65–99)
Potassium: 4.2 mmol/L (ref 3.5–5.2)
SODIUM: 143 mmol/L (ref 134–144)
TOTAL PROTEIN: 6.7 g/dL (ref 6.0–8.5)

## 2014-11-27 LAB — LIPID PANEL
CHOLESTEROL TOTAL: 183 mg/dL (ref 100–199)
Chol/HDL Ratio: 4.3 ratio units (ref 0.0–4.4)
HDL: 43 mg/dL (ref >39)
LDL Calculated: 115 mg/dL — ABNORMAL HIGH (ref 0–99)
Triglycerides: 127 mg/dL (ref 0–149)
VLDL Cholesterol Cal: 25 mg/dL (ref 5–40)

## 2014-11-27 LAB — VITAMIN D 25 HYDROXY (VIT D DEFICIENCY, FRACTURES): Vit D, 25-Hydroxy: 30.9 ng/mL (ref 30.0–100.0)

## 2014-11-27 LAB — HEMOGLOBIN A1C
Est. average glucose Bld gHb Est-mCnc: 128 mg/dL
Hgb A1c MFr Bld: 6.1 % — ABNORMAL HIGH (ref 4.8–5.6)

## 2014-11-27 LAB — FERRITIN: FERRITIN: 108 ng/mL (ref 15–150)

## 2014-11-27 LAB — TSH: TSH: 2.54 u[IU]/mL (ref 0.450–4.500)

## 2014-11-27 LAB — VITAMIN B12: VITAMIN B 12: 583 pg/mL (ref 211–946)

## 2014-11-29 ENCOUNTER — Encounter: Payer: Self-pay | Admitting: Family Medicine

## 2014-11-29 DIAGNOSIS — D72829 Elevated white blood cell count, unspecified: Secondary | ICD-10-CM | POA: Insufficient documentation

## 2014-11-30 NOTE — Progress Notes (Signed)
Patient notified

## 2014-12-10 ENCOUNTER — Ambulatory Visit (INDEPENDENT_AMBULATORY_CARE_PROVIDER_SITE_OTHER): Payer: Self-pay

## 2014-12-10 DIAGNOSIS — Z23 Encounter for immunization: Secondary | ICD-10-CM

## 2014-12-21 ENCOUNTER — Ambulatory Visit: Payer: Self-pay | Admitting: Family Medicine

## 2015-02-10 ENCOUNTER — Telehealth: Payer: Self-pay

## 2015-02-10 DIAGNOSIS — N6452 Nipple discharge: Secondary | ICD-10-CM

## 2015-02-10 NOTE — Telephone Encounter (Addendum)
Patient seen in clinic on 10/09/14 by [name] who recommended that patient follow-up with diagnostic right mammogram in January and then follow back up in office after mammogram appointment with a surgeon.  Spoke with Dr. Tonita CongWoodham in regards to this. He reviewed note by [name] and has same recommendations. Orders placed.  Called [facility] at this time to schedule mammogram. No answer. Left voicemail for return phone call.

## 2015-02-10 NOTE — Telephone Encounter (Signed)
Jamie from [facility] called back at this time.  Orders were replaced with correct image numbers by instruction.  Patient scheduled for 02/18/15 at 1pm. Arrive 10 minutes early.

## 2015-02-10 NOTE — Telephone Encounter (Signed)
Called patient at this time to give Mammogram scheduling information and to schedule follow-up appointment with Dr. Tonita CongWoodham for after mammogram.  No answer. Unable to leave voicemail at this time.

## 2015-02-11 ENCOUNTER — Telehealth: Payer: Self-pay | Admitting: General Surgery

## 2015-02-11 NOTE — Telephone Encounter (Signed)
Returned phone call to patient once again at this time. No answer. Unable to leave voicemail. Will try once again later.

## 2015-02-11 NOTE — Telephone Encounter (Signed)
Called patient's work number at this time. 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 Left message with just number to call and my name for return phone call.

## 2015-02-11 NOTE — Telephone Encounter (Signed)
Patient is returning your phone call. 

## 2015-02-11 NOTE — Telephone Encounter (Signed)
Returned phone call to patient at this time. Given appointment information and appointment for follow-up scheduled in the office.

## 2015-02-11 NOTE — Telephone Encounter (Signed)
Called patient on home phone once again at this time. No answer. Still unable to leave voicemail as voicemail is full.  Call made to Emergency contact, Adline MangoMarion Graves who took down my name and number and will have patient call our office.   When patient returns phone call, she needs to be given Mammogram information below and a follow-up needs to be scheduled for after 12/29 with Dr. Tonita CongWoodham to go over mammogram results.

## 2015-02-11 NOTE — Telephone Encounter (Signed)
Patient contacted office. Please see other notes in chart.

## 2015-02-18 ENCOUNTER — Ambulatory Visit: Admission: RE | Admit: 2015-02-18 | Payer: Self-pay | Source: Ambulatory Visit

## 2015-02-18 ENCOUNTER — Other Ambulatory Visit: Payer: Self-pay

## 2015-02-19 ENCOUNTER — Other Ambulatory Visit: Payer: Self-pay | Admitting: Family Medicine

## 2015-02-19 NOTE — Telephone Encounter (Signed)
Patient requesting refill. 

## 2015-03-04 ENCOUNTER — Telehealth: Payer: Self-pay | Admitting: Family Medicine

## 2015-03-04 MED ORDER — CEPHALEXIN 500 MG PO CAPS: 500.0000 mg | ORAL_CAPSULE | Freq: Four times a day (QID) | ORAL | Status: DC

## 2015-03-04 NOTE — Telephone Encounter (Signed)
It is not on her medication list. What for?

## 2015-03-04 NOTE — Telephone Encounter (Signed)
Patient states she takes Keflex for her boils under her left axillary and you have seen her for this in the past.

## 2015-03-04 NOTE — Telephone Encounter (Signed)
done

## 2015-03-04 NOTE — Telephone Encounter (Signed)
Pt would like refill on Keflex to be sent to [facility].

## 2015-03-05 ENCOUNTER — Ambulatory Visit: Payer: Self-pay | Admitting: Physician Assistant

## 2015-03-15 ENCOUNTER — Ambulatory Visit: Payer: Self-pay | Admitting: General Surgery

## 2015-03-22 ENCOUNTER — Ambulatory Visit (INDEPENDENT_AMBULATORY_CARE_PROVIDER_SITE_OTHER): Payer: 59 | Admitting: Family Medicine

## 2015-03-22 ENCOUNTER — Encounter: Payer: Self-pay | Admitting: Family Medicine

## 2015-03-22 VITALS — BP 118/72 | HR 81 | Temp 97.8°F | Resp 16 | Ht 62.0 in | Wt 231.4 lb

## 2015-03-22 DIAGNOSIS — G4733 Obstructive sleep apnea (adult) (pediatric): Secondary | ICD-10-CM

## 2015-03-22 DIAGNOSIS — I10 Essential (primary) hypertension: Secondary | ICD-10-CM

## 2015-03-22 DIAGNOSIS — M1712 Unilateral primary osteoarthritis, left knee: Secondary | ICD-10-CM

## 2015-03-22 DIAGNOSIS — M25572 Pain in left ankle and joints of left foot: Secondary | ICD-10-CM

## 2015-03-22 DIAGNOSIS — D72829 Elevated white blood cell count, unspecified: Secondary | ICD-10-CM

## 2015-03-22 DIAGNOSIS — F330 Major depressive disorder, recurrent, mild: Secondary | ICD-10-CM

## 2015-03-22 DIAGNOSIS — M797 Fibromyalgia: Secondary | ICD-10-CM

## 2015-03-22 DIAGNOSIS — L732 Hidradenitis suppurativa: Secondary | ICD-10-CM

## 2015-03-22 MED ORDER — METAXALONE 800 MG PO TABS: 800.0000 mg | ORAL_TABLET | Freq: Three times a day (TID) | ORAL | Status: DC

## 2015-03-22 MED ORDER — PREGABALIN 75 MG PO CAPS: 75.0000 mg | ORAL_CAPSULE | Freq: Two times a day (BID) | ORAL | Status: DC

## 2015-03-22 MED ORDER — OLMESARTAN MEDOXOMIL-HCTZ 20-12.5 MG PO TABS: 1.0000 | ORAL_TABLET | Freq: Every day | ORAL | Status: DC

## 2015-03-22 MED ORDER — MELOXICAM 15 MG PO TABS: 15.0000 mg | ORAL_TABLET | Freq: Every day | ORAL | Status: DC

## 2015-03-22 MED ORDER — DULOXETINE HCL 60 MG PO CPEP: 60.0000 mg | ORAL_CAPSULE | Freq: Every day | ORAL | Status: DC

## 2015-03-22 NOTE — Progress Notes (Signed)
Name: Kathleen FullKaryn [id number]    DOB: 28-May-1970   Date:03/22/2015       Progress Note  Subjective  Chief Complaint  Chief Complaint  Patient presents with  . Medication Refill    4 month F/U  . Shift work sleep disorder    Still having trouble waking up after sleeping from shift-works third shift.  . Hypertension    Needs refill of medication  . Fibromyalgia    Improving but still has some pain  . Depression    Improving but notices she sleeps for long periods of time.  . Allergic Rhinitis     Combination of oral and nasal medication helps symptoms    HPI  FMS: she has noticed an improvement on pain level since she started taking Lyrica, currently on 50 mg twice daily and pain is 4-5/10. Able to function. She also takes Cymbalta, Skelaxin and Meloxicam. She states her mental fogginess is okay.   HTN: out of bp medication for the past three days and bp is at goal. Denies chest pain, SOB or palpitation. We will decrease dose of medication from  Tribenzor 20/12.5/5 mg to Benicar 20/12.5  AR: taking otc medication and is doing well, occasionally uses nasal spray, symptoms are worse in Spring  SWSD and OSA: had sleep study but not severe enough to wear CPAP. She feels very tired while working 3rd shift , she has noticed an improvement with Nuvigil. No side effects   Major Depression mild : she is in partial remission,she has noticed anhedonia and sleeping more on her days off, but denies sadness or  crying spells. Compliant with medicaiton , she does not want to adjust medication   Left ankle pain: she does not recall any injury of her ankle, she noticed that over few months pain on lateral aspect of left ankle.  She states it depends on position, more frequent over the past couple of months. Discussed PT, but she would like to hold off for now.    Patient Active Problem List   Diagnosis Date Noted  . Leukocytosis 11/29/2014  . Osteoarthritis of left knee 11/20/2014  .  History of ventral hernia repair 10/27/2014  . Allergic rhinitis 08/17/2014  . Axillary hidradenitis suppurativa 08/17/2014  . Baker cyst 08/17/2014  . Chronic constipation 08/17/2014  . 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 Engages in travel abroad 08/17/2014  . Fibromyalgia 08/17/2014  . Cardiac murmur 08/17/2014  . Anemia, iron deficiency 08/17/2014  . Excessive and frequent menstruation 08/17/2014  . Dysmetabolic syndrome 08/17/2014  . Plantar fasciitis 08/17/2014  . Beat, premature ventricular 08/17/2014  . 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 Obstructive apnea 12/02/2013  . Essential (primary) hypertension 12/09/2009  . Hypertrichosis 12/09/2009  . Extreme obesity (HCC) 12/09/2009    Past Surgical History  Procedure Laterality Date  . Oophorectomy Left 12/09/2009  . Ventral hernia repair  10/09/2011  . Hernia repair  10/09/11    Ventral Incarcerated- Dr. [name]  . Oophorectomy Left   . Tubal ligation  1993    Family History  Problem Relation Age of Onset  . Breast cancer Paternal Grandmother   . Heart disease Father   . Hypertension Father   . Diabetes Father   . Hypertension Mother   . CVA Mother   . Kidney disease Mother   . Congenital heart disease Mother   . Colon cancer Maternal Grandfather   . Colon cancer Maternal Uncle  Social History   Social History  . Marital Status: Single    Spouse Name: N/A  . Number of Children: 3  . Years of Education: Associates   Occupational History  . 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 Smoking status: Never Smoker   . Smokeless tobacco: Never Used  . Alcohol Use: No  . Drug Use: No  . Sexual Activity: Yes    Birth Control/ Protection: IUD   Other Topics Concern  . Not on file   Social History Narrative     Current outpatient prescriptions:  .  Armodafinil (NUVIGIL) 150 MG tablet, Take 1 tablet (150 mg total)  by mouth daily., Disp: 30 tablet, Rfl: 2 .  cetirizine (ZYRTEC) 10 MG tablet, Take 1 tablet by mouth daily., Disp: , Rfl:  .  Cholecalciferol (VITAMIN D) 2000 UNITS tablet, Take 1 tablet by mouth daily., Disp: , Rfl:  .  docusate sodium (COLACE) 100 MG capsule, Take 1 tablet by mouth as needed., Disp: , Rfl:  .  DULoxetine (CYMBALTA) 60 MG capsule, Take 1 capsule (60 mg total) by mouth daily., Disp: 90 capsule, Rfl: 1 .  ferrous sulfate 325 (65 FE) MG tablet, Take 1 tablet by mouth daily., Disp: , Rfl:  .  fluticasone (FLONASE) 50 MCG/ACT nasal spray, Place 2 sprays into the nose daily., Disp: , Rfl:  .  levonorgestrel (MIRENA, 52 MG,) 20 MCG/24HR IUD, 1 each by Intrauterine route., Disp: , Rfl:  .  meloxicam (MOBIC) 15 MG tablet, Take 1 tablet (15 mg total) by mouth daily., Disp: 90 tablet, Rfl: 1 .  metaxalone (SKELAXIN) 800 MG tablet, Take 1 tablet (800 mg total) by mouth 3 (three) times daily., Disp: 90 tablet, Rfl: 2 .  montelukast (SINGULAIR) 10 MG tablet, Take 1 tablet by mouth daily., Disp: , Rfl:  .  olmesartan-hydrochlorothiazide (BENICAR HCT) 20-12.5 MG tablet, Take 1 tablet by mouth daily., Disp: 30 tablet, Rfl: 2 .  pregabalin (LYRICA) 75 MG capsule, Take 1 capsule (75 mg total) by mouth 2 (two) times daily., Disp: 60 capsule, Rfl: 2  No Known Allergies   ROS  Constitutional: Negative for fever or weight change.  Respiratory: Negative for cough and shortness of breath.   Cardiovascular: Negative for chest pain or palpitations.  Gastrointestinal: Negative for abdominal pain, no bowel changes.  Musculoskeletal: Negative for gait problem or joint swelling.  Skin: Negative for rash.  Neurological: Negative for dizziness or headache.  No other specific complaints in a complete review of systems (except as listed in HPI above).  Objective  Filed Vitals:   03/22/15 1529  BP: 118/72  Pulse: 81  Temp: 97.8 F (36.6 C)  TempSrc: Oral  Resp: 16  Height:  (1.575 m)   Weight: 231 lb 6.4 oz (104.962 kg)  SpO2: 98%    Body mass index is 42.31 kg/(m^2).  Physical Exam  Constitutional: Patient appears well-developed and well-nourished. Obese No distress.  HEENT: head atraumatic, normocephalic, pupils equal and reactive to light,strabismus,  neck supple, throat within normal limits Cardiovascular: Normal rate, regular rhythm and normal heart sounds.  No murmur heard. No BLE edema. Pulmonary/Chest: Effort normal and breath sounds normal. No respiratory distress. Abdominal: Soft.  There is no tenderness. Psychiatric: Patient has a normal mood and affect. behavior is normal. Judgment and thought content normal. Muscular Skeletal: no effusion or redness, mild pain on right lateral malleolus. She has flat feet, currently wearing orthotics, seen by Dr.  Hyatt  PHQ2/9: Depression screen [facility] 2/9 03/22/2015 11/20/2014 08/20/2014  Decreased Interest 0 0 1  Down, Depressed, Hopeless 0 0 0  PHQ - 2 Score 0 0 1    Fall Risk: Fall Risk  03/22/2015 11/20/2014 08/20/2014  Falls in the past year? No No No    Functional Status Survey: Is the patient deaf or have difficulty hearing?: No Does the patient have difficulty seeing, even when wearing glasses/contacts?: No Does the patient have difficulty concentrating, remembering, or making decisions?: No Does the patient have difficulty walking or climbing stairs?: No Does the patient have difficulty dressing or bathing?: No Does the patient have difficulty doing errands alone such as visiting a doctor's office or shopping?: No    Assessment & Plan  1. Leukocytosis  - CBC with Differential/Platelet - she finished antibiotics for hydradenitis - we will recheck it  2. Axillary hidradenitis suppurativa  Just finished antibiotics  3. Essential (primary) hypertension   - olmesartan-hydrochlorothiazide (BENICAR HCT) 20-12.5 MG tablet; Take 1 tablet by mouth daily.  Dispense: 30 tablet; Refill: 2  4. Obstructive  apnea  Doing well at this time  5. Fibromyalgia  Doing well - DULoxetine (CYMBALTA) 60 MG capsule; Take 1 capsule (60 mg total) by mouth daily.  Dispense: 90 capsule; Refill: 1 - metaxalone (SKELAXIN) 800 MG tablet; Take 1 tablet (800 mg total) by mouth 3 (three) times daily.  Dispense: 90 tablet; Refill: 2 - pregabalin (LYRICA) 75 MG capsule; Take 1 capsule (75 mg total) by mouth 2 (two) times daily.  Dispense: 60 capsule; Refill: 2  6. Depression, major, recurrent, mild (HCC)  Continue medication  - DULoxetine (CYMBALTA) 60 MG capsule; Take 1 capsule (60 mg total) by mouth daily.  Dispense: 90 capsule; Refill: 1  8. Primary osteoarthritis of left knee  Doing well on medication, no recent pain  - meloxicam (MOBIC) 15 MG tablet; Take 1 tablet (15 mg total) by mouth daily.  Dispense: 90 tablet; Refill: 1   8. Left ankle pain  May need PT or see Dr. Al Corpus, she wants to hold off for now

## 2015-03-23 ENCOUNTER — Other Ambulatory Visit: Payer: Self-pay | Admitting: Family Medicine

## 2015-03-23 DIAGNOSIS — D72829 Elevated white blood cell count, unspecified: Secondary | ICD-10-CM

## 2015-03-23 LAB — CBC WITH DIFFERENTIAL/PLATELET
BASOS ABS: 0 10*3/uL (ref 0.0–0.2)
BASOS: 0 %
EOS (ABSOLUTE): 0.4 10*3/uL (ref 0.0–0.4)
Eos: 3 %
Hematocrit: 36.8 % (ref 34.0–46.6)
Hemoglobin: 11.7 g/dL (ref 11.1–15.9)
Immature Grans (Abs): 0 10*3/uL (ref 0.0–0.1)
Immature Granulocytes: 0 %
LYMPHS ABS: 3.2 10*3/uL — AB (ref 0.7–3.1)
Lymphs: 25 %
MCH: 25.3 pg — AB (ref 26.6–33.0)
MCHC: 31.8 g/dL (ref 31.5–35.7)
MCV: 80 fL (ref 79–97)
Monocytes Absolute: 1.3 10*3/uL — ABNORMAL HIGH (ref 0.1–0.9)
Monocytes: 11 %
NEUTROS ABS: 7.7 10*3/uL — AB (ref 1.4–7.0)
Neutrophils: 61 %
PLATELETS: 378 10*3/uL (ref 150–379)
RBC: 4.62 x10E6/uL (ref 3.77–5.28)
RDW: 16.2 % — ABNORMAL HIGH (ref 12.3–15.4)
WBC: 12.7 10*3/uL — ABNORMAL HIGH (ref 3.4–10.8)

## 2015-03-25 ENCOUNTER — Telehealth: Payer: Self-pay | Admitting: Family Medicine

## 2015-03-25 NOTE — Telephone Encounter (Signed)
Employee pharmacy did not receive her benicar prescription please resend

## 2015-03-26 NOTE — Telephone Encounter (Signed)
RX for Benicar was called in to [facility] prescriber's line.

## 2015-03-30 ENCOUNTER — Encounter: Payer: Self-pay | Admitting: *Deleted

## 2015-03-30 ENCOUNTER — Inpatient Hospital Stay: Payer: 59 | Admitting: Family Medicine

## 2015-03-30 NOTE — Progress Notes (Signed)
No show in cancer center today with Verlon Au, NP. Message sent to cancer center scheduling to have this apt r/s.

## 2015-04-06 ENCOUNTER — Inpatient Hospital Stay: Payer: 59 | Admitting: Hematology and Oncology

## 2015-04-16 ENCOUNTER — Inpatient Hospital Stay: Payer: 59 | Attending: Hematology and Oncology | Admitting: Hematology and Oncology

## 2015-06-10 DIAGNOSIS — Q6652 Congenital pes planus, left foot: Secondary | ICD-10-CM | POA: Diagnosis not present

## 2015-06-10 DIAGNOSIS — M216X2 Other acquired deformities of left foot: Secondary | ICD-10-CM | POA: Diagnosis not present

## 2015-06-10 DIAGNOSIS — M25572 Pain in left ankle and joints of left foot: Secondary | ICD-10-CM | POA: Diagnosis not present

## 2015-06-18 ENCOUNTER — Encounter: Payer: Self-pay | Admitting: Family Medicine

## 2015-06-18 ENCOUNTER — Ambulatory Visit (INDEPENDENT_AMBULATORY_CARE_PROVIDER_SITE_OTHER): Payer: 59 | Admitting: Family Medicine

## 2015-06-18 VITALS — BP 126/80 | HR 92 | Temp 98.4°F | Resp 16 | Ht 62.0 in | Wt 227.7 lb

## 2015-06-18 DIAGNOSIS — I10 Essential (primary) hypertension: Secondary | ICD-10-CM | POA: Diagnosis not present

## 2015-06-18 DIAGNOSIS — R252 Cramp and spasm: Secondary | ICD-10-CM

## 2015-06-18 DIAGNOSIS — Z8639 Personal history of other endocrine, nutritional and metabolic disease: Secondary | ICD-10-CM

## 2015-06-18 DIAGNOSIS — F330 Major depressive disorder, recurrent, mild: Secondary | ICD-10-CM | POA: Diagnosis not present

## 2015-06-18 DIAGNOSIS — M797 Fibromyalgia: Secondary | ICD-10-CM

## 2015-06-18 DIAGNOSIS — M1712 Unilateral primary osteoarthritis, left knee: Secondary | ICD-10-CM

## 2015-06-18 DIAGNOSIS — D72829 Elevated white blood cell count, unspecified: Secondary | ICD-10-CM | POA: Diagnosis not present

## 2015-06-18 DIAGNOSIS — G4733 Obstructive sleep apnea (adult) (pediatric): Secondary | ICD-10-CM

## 2015-06-18 DIAGNOSIS — G4726 Circadian rhythm sleep disorder, shift work type: Secondary | ICD-10-CM | POA: Diagnosis not present

## 2015-06-18 MED ORDER — ARMODAFINIL 150 MG PO TABS: 150.0000 mg | ORAL_TABLET | Freq: Every day | ORAL | Status: DC

## 2015-06-18 MED ORDER — ACETAMINOPHEN 500 MG PO TABS: 500.0000 mg | ORAL_TABLET | Freq: Four times a day (QID) | ORAL | Status: AC | PRN

## 2015-06-18 MED ORDER — METAXALONE 800 MG PO TABS: 800.0000 mg | ORAL_TABLET | Freq: Three times a day (TID) | ORAL | Status: DC

## 2015-06-18 MED ORDER — OLMESARTAN MEDOXOMIL-HCTZ 20-12.5 MG PO TABS: 1.0000 | ORAL_TABLET | Freq: Every day | ORAL | Status: DC

## 2015-06-18 MED ORDER — PREGABALIN 100 MG PO CAPS: 100.0000 mg | ORAL_CAPSULE | Freq: Two times a day (BID) | ORAL | Status: DC

## 2015-06-18 MED ORDER — DULOXETINE HCL 60 MG PO CPEP: 60.0000 mg | ORAL_CAPSULE | Freq: Every day | ORAL | Status: DC

## 2015-06-18 NOTE — Progress Notes (Signed)
Name: Kathleen Conley   MRN: [id number]    DOB: 1970-11-08   Date:06/18/2015       Progress Note  Subjective  Chief Complaint  Chief Complaint  Patient presents with  . Hypertension    patient is here for her 63-month f/u. patient presents with no negative sx.  Marland Kitchen Apnea    patient stated that her sleep is always out of wack due to 3rd shift  . Fibromyalgia    patient stated that the medicine is helping but some days are worse than others  . Depression    patient stated this comes and goes and she works through it.  . primary osteoarthritis of the left knee    knee is about the same. the meloxicam has helped.  Marland Kitchen Spasms    patient stated that she has been having intermittent leg & feet cramps for about a month. she has tried eating bananas but it hasn't helped.   . Ankle Pain    patient was seen by Dr. Ether Griffins for left anke pain. he told her that it was due to her having flat feet and was given rx for prednisone, she has to f/u in May for a cortisone injection.    HPI  FMS: she has noticed an improvement on pain level since she started taking Lyrica in the Fall of 2016, currently on 75  mg twice daily and pain is 5/10. Able to function, she states pain is debilitant about 4 days of the week - usually the day after she works. She also takes Cymbalta, Skelaxin and Meloxicam. She states her mental fogginess is okay. Discussed increasing dose of Lyrica.   HTN: continue Benicar and bp is at goal, no chest pain or palpitation  AR: taking otc medication and is doing well, occasionally uses nasal spray, symptoms tend to be worse during the Spring, but is doing well on nasal spray and Zyrtec daily. She has not been taking the singulair.  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 She feels very tired while working 3rd shift , she has noticed an improvement with Nuvigil. 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 Compliant with Cymbalta,denies side effects.  She does not want to adjust medication   Left ankle pain: seeing Dr. Ether Griffins, diagnosed with flat feet, and has a strain, she is going back for steroid injection  OA knee: taking Meloxicam, but advised to try weaning self off and try Tylenol instead  Leg Cramps: she states that over the past month she has been waking up with leg cramps - like a Charlie horse, and she has to stretch to make it better. She is taking Skelaxin but is not working   History of anemia: still taking iron, it was secondary to heavy cycles, but now has an IUD and has been doing well  Leukocytosis: she missed appointment with Hematologist but will re-schedule it    Patient Active Problem List   Diagnosis Date Noted  . Leukocytosis 11/29/2014  . Osteoarthritis of left knee 11/20/2014  . History of ventral hernia repair 10/27/2014  . Allergic rhinitis 08/17/2014  . Axillary hidradenitis suppurativa 08/17/2014  . Baker cyst 08/17/2014  . Chronic constipation 08/17/2014  . 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 Engages in travel abroad 08/17/2014  . Fibromyalgia 08/17/2014  . Cardiac murmur 08/17/2014  .  Anemia, iron deficiency 08/17/2014  . Excessive and frequent menstruation 08/17/2014  . Dysmetabolic syndrome 08/17/2014  . Plantar fasciitis 08/17/2014  . Beat, premature ventricular 08/17/2014  . 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 Obstructive apnea 12/02/2013  . Essential (primary) hypertension 12/09/2009  . Hypertrichosis 12/09/2009  . Extreme obesity (HCC) 12/09/2009    Past Surgical History  Procedure Laterality Date  . Oophorectomy Left 12/09/2009  . Ventral hernia repair  10/09/2011  . Hernia repair  10/09/11    Ventral Incarcerated- Dr. [name]  . Oophorectomy Left   . Tubal ligation  1993    Family History  Problem  Relation Age of Onset  . Breast cancer Paternal Grandmother   . Heart disease Father   . Hypertension Father   . Diabetes Father   . Hypertension Mother   . CVA Mother   . Kidney disease Mother   . Congenital heart disease Mother   . Colon cancer Maternal Grandfather   . Colon cancer Maternal Uncle     Social History   Social History  . Marital Status: Single    Spouse Name: N/A  . Number of Children: 3  . Years of Education: Associates   Occupational History  . 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 Smoking status: Never Smoker   . Smokeless tobacco: Never Used  . Alcohol Use: No  . Drug Use: No  . Sexual Activity: Yes    Birth Control/ Protection: IUD   Other Topics Concern  . Not on file   Social History Narrative     Current outpatient prescriptions:  .  Armodafinil (NUVIGIL) 150 MG tablet, Take 1 tablet (150 mg total) by mouth daily., Disp: 30 tablet, Rfl: 2 .  cetirizine (ZYRTEC) 10 MG tablet, Take 1 tablet by mouth daily., Disp: , Rfl:  .  Cholecalciferol (VITAMIN D) 2000 UNITS tablet, Take 1 tablet by mouth daily., Disp: , Rfl:  .  docusate sodium (COLACE) 100 MG capsule, Take 1 tablet by mouth as needed., Disp: , Rfl:  .  DULoxetine (CYMBALTA) 60 MG capsule, Take 1 capsule (60 mg total) by mouth daily., Disp: 90 capsule, Rfl: 1 .  ferrous sulfate 325 (65 FE) MG tablet, Take 1 tablet by mouth daily., Disp: , Rfl:  .  fluticasone (FLONASE) 50 MCG/ACT nasal spray, Place 2 sprays into the nose daily., Disp: , Rfl:  .  levonorgestrel (MIRENA, 52 MG,) 20 MCG/24HR IUD, 1 each by Intrauterine route., Disp: , Rfl:  .  meloxicam (MOBIC) 15 MG tablet, Take 1 tablet (15 mg total) by mouth daily., Disp: 90 tablet, Rfl: 1 .  metaxalone (SKELAXIN) 800 MG tablet, Take 1 tablet (800 mg total) by mouth 3 (three) times daily., Disp: 90 tablet, Rfl: 2 .  olmesartan-hydrochlorothiazide (BENICAR HCT) 20-12.5 MG tablet, Take 1 tablet by mouth daily.,  Disp: 30 tablet, Rfl: 2 .  pregabalin (LYRICA) 100 MG capsule, Take 1 capsule (100 mg total) by mouth 2 (two) times daily., Disp: 60 capsule, Rfl: 2  No Known Allergies   ROS  Constitutional: Negative for fever or weight change.  Respiratory: Negative for cough and shortness of breath.   Cardiovascular: Negative for chest pain or palpitations.  Gastrointestinal: Negative for abdominal pain, no bowel changes.  Musculoskeletal: Positive  for gait problem ( limps sometimes because of left ankle problems ) or joint swelling.  Skin: Negative for rash.  Neurological: Negative for dizziness or headache.  No other specific complaints in a complete review of systems (except as listed in HPI above).  Objective  Filed Vitals:   06/18/15 1027  BP: 126/80  Pulse: 92  Temp: 98.4 F (36.9 C)  TempSrc: Oral  Resp: 16  Height: 5\' 2"  (1.575 m)  Weight: 227 lb 11.2 oz (103.284 kg)  SpO2: 99%    Body mass index is 41.64 kg/(m^2).  Physical Exam  Constitutional: Patient appears well-developed and well-nourished. Obese  No distress.  HEENT: head atraumatic, normocephalic, pupils equal and reactive to light, , neck supple, throat within normal limits Cardiovascular: Normal rate, regular rhythm and normal heart sounds.  No murmur heard. No BLE edema. Pulmonary/Chest: Effort normal and breath sounds normal. No respiratory distress. Abdominal: Soft.  There is no tenderness. Psychiatric: Patient has a normal mood and affect. behavior is normal. Judgment and thought content normal. Muscular Skeletal: pain on left lateral malleolus, mild crepitus on right knee, normal left knee ( even though pain usually on left ) , Trigger points positive but less intense pain  Recent Results (from the past 2160 hour(s))  CBC with Differential/Platelet     Status: Abnormal   Collection Time: 03/22/15  4:20 PM  Result Value Ref Range   WBC 12.7 (H) 3.4 - 10.8 x10E3/uL   RBC 4.62 3.77 - 5.28 x10E6/uL   Hemoglobin  11.7 11.1 - 15.9 g/dL   Hematocrit 16.136.8 09.634.0 - 46.6 %   MCV 80 79 - 97 fL   MCH 25.3 (L) 26.6 - 33.0 pg   MCHC 31.8 31.5 - 35.7 g/dL   RDW 04.516.2 (H) 40.912.3 - 81.115.4 %   Platelets 378 150 - 379 x10E3/uL   Neutrophils 61 %   Lymphs 25 %   Monocytes 11 %   Eos 3 %   Basos 0 %   Neutrophils Absolute 7.7 (H) 1.4 - 7.0 x10E3/uL   Lymphocytes Absolute 3.2 (H) 0.7 - 3.1 x10E3/uL   Monocytes Absolute 1.3 (H) 0.1 - 0.9 x10E3/uL   EOS (ABSOLUTE) 0.4 0.0 - 0.4 x10E3/uL   Basophils Absolute 0.0 0.0 - 0.2 x10E3/uL   Immature Granulocytes 0 %   Immature Grans (Abs) 0.0 0.0 - 0.1 x10E3/uL     PHQ2/9: [facility] 06/18/2015 03/22/2015 11/20/2014 08/20/2014  Decreased Interest 0 0 0 1  Down, Depressed, Hopeless 0 0 0 0  PHQ - 2 Score 0 0 0 1    Fall Risk: Fall Risk  06/18/2015 03/22/2015 11/20/2014 08/20/2014  Falls in the past year? No No No No     Functional Status Survey: Is the patient deaf or have difficulty hearing?: No Does the patient have difficulty seeing, even when wearing glasses/contacts?: No Does the patient have difficulty concentrating, remembering, or making decisions?: No Does the patient have difficulty walking or climbing stairs?: Yes (due to knee and ankle pain) Does the patient have difficulty dressing or bathing?: No Does the patient have difficulty doing errands alone such as visiting a doctor's office or shopping?: No    Assessment & Plan  1. Leukocytosis  - CBC with Differential/Platelet  2. Essential (primary) hypertension  - olmesartan-hydrochlorothiazide (BENICAR HCT) 20-12.5 MG tablet; Take 1 tablet by mouth daily.  Dispense: 30 tablet; Refill: 2 - Comprehensive metabolic panel  3. Obstructive apnea  - Armodafinil (NUVIGIL) 150 MG tablet; Take 1 tablet (150 mg total) by mouth daily.  Dispense: 30 tablet; Refill: 2  4. Fibromyalgia  - DULoxetine (CYMBALTA) 60 MG capsule; Take 1 capsule (60 mg  total) by mouth daily.  Dispense: 90 capsule;  Refill: 1 - metaxalone (SKELAXIN) 800 MG tablet; Take 1 tablet (800 mg total) by mouth 3 (three) times daily.  Dispense: 90 tablet; Refill: 2 - pregabalin (LYRICA) 100 MG capsule; Take 1 capsule (100 mg total) by mouth 2 (two) times daily.  Dispense: 60 capsule; Refill: 2  5. Depression, major, recurrent, mild (HCC)  Continue Cymbalta, stable  6. Shift work sleep disorder  - Armodafinil (NUVIGIL) 150 MG tablet; Take 1 tablet (150 mg total) by mouth daily.  Dispense: 30 tablet; Refill: 2  7. Primary osteoarthritis of left knee  Try Tylenol - up to 3 gm   8. History of iron deficiency  - Ferritin  9. Cramp of both lower extremities  - Comprehensive metabolic panel - Magnesium

## 2015-06-19 LAB — COMPREHENSIVE METABOLIC PANEL
ALK PHOS: 73 IU/L (ref 39–117)
ALT: 13 IU/L (ref 0–32)
AST: 11 IU/L (ref 0–40)
Albumin/Globulin Ratio: 1.6 (ref 1.2–2.2)
Albumin: 4.1 g/dL (ref 3.5–5.5)
BUN/Creatinine Ratio: 18 (ref 9–23)
BUN: 11 mg/dL (ref 6–24)
Bilirubin Total: <0.2 mg/dL (ref 0.0–1.2)
CO2: 25 mmol/L (ref 18–29)
CREATININE: 0.61 mg/dL (ref 0.57–1.00)
Calcium: 9.2 mg/dL (ref 8.7–10.2)
Chloride: 101 mmol/L (ref 96–106)
GFR calc Af Amer: 126 mL/min/{1.73_m2} (ref >59)
GFR calc non Af Amer: 109 mL/min/{1.73_m2} (ref >59)
GLOBULIN, TOTAL: 2.6 g/dL (ref 1.5–4.5)
GLUCOSE: 83 mg/dL (ref 65–99)
Potassium: 4.2 mmol/L (ref 3.5–5.2)
SODIUM: 141 mmol/L (ref 134–144)
Total Protein: 6.7 g/dL (ref 6.0–8.5)

## 2015-06-19 LAB — CBC WITH DIFFERENTIAL/PLATELET
BASOS ABS: 0 10*3/uL (ref 0.0–0.2)
Basos: 0 %
EOS (ABSOLUTE): 0.3 10*3/uL (ref 0.0–0.4)
EOS: 2 %
Hematocrit: 38.8 % (ref 34.0–46.6)
Hemoglobin: 12.6 g/dL (ref 11.1–15.9)
IMMATURE GRANULOCYTES: 0 %
Immature Grans (Abs): 0 10*3/uL (ref 0.0–0.1)
Lymphocytes Absolute: 4.5 10*3/uL — ABNORMAL HIGH (ref 0.7–3.1)
Lymphs: 35 %
MCH: 26.6 pg (ref 26.6–33.0)
MCHC: 32.5 g/dL (ref 31.5–35.7)
MCV: 82 fL (ref 79–97)
MONOS ABS: 1 10*3/uL — AB (ref 0.1–0.9)
Monocytes: 8 %
NEUTROS PCT: 55 %
Neutrophils Absolute: 6.9 10*3/uL (ref 1.4–7.0)
PLATELETS: 343 10*3/uL (ref 150–379)
RBC: 4.73 x10E6/uL (ref 3.77–5.28)
RDW: 16.7 % — AB (ref 12.3–15.4)
WBC: 12.8 10*3/uL — AB (ref 3.4–10.8)

## 2015-06-19 LAB — FERRITIN: FERRITIN: 113 ng/mL (ref 15–150)

## 2015-06-19 LAB — MAGNESIUM: MAGNESIUM: 2.1 mg/dL (ref 1.6–2.3)

## 2015-06-26 ENCOUNTER — Encounter: Payer: Self-pay | Admitting: Emergency Medicine

## 2015-06-26 ENCOUNTER — Emergency Department
Admission: EM | Admit: 2015-06-26 | Discharge: 2015-06-26 | Disposition: A | Discharge status: Home / Self Care | Payer: 59 | Attending: Emergency Medicine | Admitting: Emergency Medicine

## 2015-06-26 DIAGNOSIS — F330 Major depressive disorder, recurrent, mild: Secondary | ICD-10-CM | POA: Diagnosis not present

## 2015-06-26 DIAGNOSIS — Z79899 Other long term (current) drug therapy: Secondary | ICD-10-CM | POA: Insufficient documentation

## 2015-06-26 DIAGNOSIS — Z791 Long term (current) use of non-steroidal anti-inflammatories (NSAID): Secondary | ICD-10-CM | POA: Insufficient documentation

## 2015-06-26 DIAGNOSIS — R42 Dizziness and giddiness: Secondary | ICD-10-CM | POA: Insufficient documentation

## 2015-06-26 DIAGNOSIS — Z7951 Long term (current) use of inhaled steroids: Secondary | ICD-10-CM | POA: Diagnosis not present

## 2015-06-26 DIAGNOSIS — I10 Essential (primary) hypertension: Secondary | ICD-10-CM | POA: Diagnosis not present

## 2015-06-26 DIAGNOSIS — E669 Obesity, unspecified: Secondary | ICD-10-CM | POA: Diagnosis not present

## 2015-06-26 HISTORY — DX: Unspecified osteoarthritis, unspecified site: M19.90

## 2015-06-26 LAB — BASIC METABOLIC PANEL
ANION GAP: 9 (ref 5–15)
BUN: 12 mg/dL (ref 6–20)
CHLORIDE: 108 mmol/L (ref 101–111)
CO2: 24 mmol/L (ref 22–32)
Calcium: 9.7 mg/dL (ref 8.9–10.3)
Creatinine, Ser: 0.69 mg/dL (ref 0.44–1.00)
GFR calc Af Amer: >60 mL/min (ref >60)
GFR calc non Af Amer: >60 mL/min (ref >60)
GLUCOSE: 127 mg/dL — AB (ref 65–99)
POTASSIUM: 3.6 mmol/L (ref 3.5–5.1)
Sodium: 141 mmol/L (ref 135–145)

## 2015-06-26 LAB — CBC
HEMATOCRIT: 38.8 % (ref 35.0–47.0)
HEMOGLOBIN: 12.4 g/dL (ref 12.0–16.0)
MCH: 26.2 pg (ref 26.0–34.0)
MCHC: 31.9 g/dL — AB (ref 32.0–36.0)
MCV: 82 fL (ref 80.0–100.0)
Platelets: 312 10*3/uL (ref 150–440)
RBC: 4.73 MIL/uL (ref 3.80–5.20)
RDW: 17.8 % — AB (ref 11.5–14.5)
WBC: 14.8 10*3/uL — ABNORMAL HIGH (ref 3.6–11.0)

## 2015-06-26 LAB — URINALYSIS COMPLETE WITH MICROSCOPIC (ARMC ONLY)
BILIRUBIN URINE: NEGATIVE
Glucose, UA: NEGATIVE mg/dL
Leukocytes, UA: NEGATIVE
NITRITE: NEGATIVE
PH: 5 (ref 5.0–8.0)
PROTEIN: 100 mg/dL — AB
Specific Gravity, Urine: 1.031 — ABNORMAL HIGH (ref 1.005–1.030)

## 2015-06-26 LAB — TROPONIN I: Troponin I: <0.03 ng/mL (ref <0.031)

## 2015-06-26 LAB — POCT PREGNANCY, URINE: Preg Test, Ur: NEGATIVE

## 2015-06-26 NOTE — ED Notes (Signed)
Pt states was at work when she began to feel dizzy, lightheaded, had right arm "squeezing" and nausea. Pt states "i just didn't feel right". Pt states the feeling lasted "a couple of hours". Pt states sensation has improved. Pt states she occasionally has squeezing in her right arm. Pt denies chest pain, back pain, jaw pain, neck pain. Pt denies shob. Pt denies diaphoresis, fever, chills.

## 2015-06-26 NOTE — ED Notes (Signed)
Pt sleeping. 

## 2015-06-26 NOTE — ED Notes (Signed)
Patient reports that she was at work and started feeling dizzy. Patient states that she checked her blood pressure and it was 159/113. Patient reports that her heart rate was also 114.

## 2015-06-26 NOTE — ED Provider Notes (Signed)
[facility] Emergency Department Provider Note    ____________________________________________  Time seen: ~0445  I have reviewed the triage vital signs and the nursing notes.   HISTORY  Chief Complaint Dizziness and Hypertension   History limited by: Not Limited   HPI Kathleen Conley is a 45 y.o. female who presented to the emergency department today because of an episode of lightheadedness and dizziness. The patient stated that this started when she was at work earlier this night. 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 She felt like she was about to pass out. She did have some associated right arm squeezing. She states this is intermittent and she has had it in the past. She denies this feeling of dizziness and lightheaded ever before. She states the time of my examination she has started to feel better. She denies any recent fevers or illness. Denies any change in oral intake.     Past Medical History  Diagnosis Date  . Hypertension   . PVC (premature ventricular contraction)   . Allergy   . Obstructive sleep apnea   . Chronic constipation   . Hydradenitis   . Baker cyst   . Fibromyalgia   . Hairiness   . Depression   . Plantar fasciitis   . Cardiac murmur   . Anemia     Iron Deficiency  . Dysmetabolic syndrome   . Obesity   . 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 Incarcerated ventral hernia 09/29/2011  . Breast mass, right 01/18/2010  . Arthritis     Patient Active Problem List   Diagnosis Date Noted  . Leukocytosis 11/29/2014  . Osteoarthritis of left knee 11/20/2014  . History of ventral hernia repair 10/27/2014  . Allergic rhinitis 08/17/2014  . Axillary hidradenitis suppurativa 08/17/2014  . Baker cyst 08/17/2014  . Chronic constipation 08/17/2014  . Depression, major, recurrent, mild (HCC) 08/17/2014   . Engages in travel abroad 08/17/2014  . Fibromyalgia 08/17/2014  . Cardiac murmur 08/17/2014  . Anemia, iron deficiency 08/17/2014  . Excessive and frequent menstruation 08/17/2014  . Dysmetabolic syndrome 08/17/2014  . Plantar fasciitis 08/17/2014  . Beat, premature ventricular 08/17/2014  . 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 Obstructive apnea 12/02/2013  . Essential (primary) hypertension 12/09/2009  . Hypertrichosis 12/09/2009  . Extreme obesity (HCC) 12/09/2009    Past Surgical History  Procedure Laterality Date  . Oophorectomy Left 12/09/2009  . Ventral hernia repair  10/09/2011  . Hernia repair  10/09/11    Ventral Incarcerated- Dr. [name]  . Oophorectomy Left   . Tubal ligation  1993    Current Outpatient Rx  Name  Route  Sig  Dispense  Refill  . acetaminophen (TYLENOL) 500 MG tablet   Oral   Take 1 tablet (500 mg total) by mouth every 6 (six) hours as needed. Max of 3 grams daily or 6 pills dialy   30 tablet   0   . Armodafinil (NUVIGIL) 150 MG tablet   Oral   Take 1 tablet (150 mg total) by mouth daily.   30 tablet   2   . cetirizine (ZYRTEC) 10 MG tablet   Oral   Take 1 tablet by mouth daily.         . Cholecalciferol (VITAMIN D) 2000 UNITS tablet   Oral   Take 1 tablet by mouth daily.         Marland Kitchen  docusate sodium (COLACE) 100 MG capsule   Oral   Take 1 tablet by mouth as needed.         . DULoxetine (CYMBALTA) 60 MG capsule   Oral   Take 1 capsule (60 mg total) by mouth daily.   90 capsule   1   . ferrous sulfate 325 (65 FE) MG tablet   Oral   Take 1 tablet by mouth daily.         . fluticasone (FLONASE) 50 MCG/ACT nasal spray   Nasal   Place 2 sprays into the nose daily.         Marland Kitchen. levonorgestrel (MIRENA, 52 MG,) 20 MCG/24HR IUD   Intrauterine   1 each by Intrauterine route.         . meloxicam (MOBIC) 15 MG tablet   Oral   Take 1 tablet (15 mg total) by mouth daily.   90  tablet   1   . metaxalone (SKELAXIN) 800 MG tablet   Oral   Take 1 tablet (800 mg total) by mouth 3 (three) times daily.   90 tablet   2   . olmesartan-hydrochlorothiazide (BENICAR HCT) 20-12.5 MG tablet   Oral   Take 1 tablet by mouth daily.   30 tablet   2   . pregabalin (LYRICA) 100 MG capsule   Oral   Take 1 capsule (100 mg total) by mouth 2 (two) times daily.   60 capsule   2     Allergies Review of patient's allergies indicates no known allergies.  Family History  Problem Relation Age of Onset  . Breast cancer Paternal Grandmother   . Heart disease Father   . Hypertension Father   . Diabetes Father   . Hypertension Mother   . CVA Mother   . Kidney disease Mother   . Congenital heart disease Mother   . Colon cancer Maternal Grandfather   . Colon cancer Maternal Uncle     Social History Social History  Substance Use Topics  . Smoking status: Never Smoker   . Smokeless tobacco: Never Used  . Alcohol Use: No    Review of Systems  Constitutional: Negative for fever. Cardiovascular: Negative for chest pain. Respiratory: Negative for shortness of breath. Gastrointestinal: Negative for abdominal pain, vomiting and diarrhea. Neurological: Negative for headaches, focal weakness or numbness.   10-point ROS otherwise negative.  ____________________________________________   PHYSICAL EXAM:  VITAL SIGNS: ED Triage Vitals  Enc Vitals Group     BP 06/26/15 0208 156/85 mmHg     Pulse Rate 06/26/15 0208 102     Resp 06/26/15 0208 18     Temp 06/26/15 0208 98.1 F (36.7 C)     Temp Source 06/26/15 0208 Oral     SpO2 06/26/15 0208 95 %     Weight 06/26/15 0213 227 lb (102.967 kg)     Height 06/26/15 0213 5\' 2"  (1.575 m)   Constitutional: Alert and oriented. Well appearing and in no distress. Eyes: Conjunctivae are normal. PERRL. Normal extraocular movements. ENT   Head: Normocephalic and atraumatic.   Nose: No congestion/rhinnorhea.    Mouth/Throat: Mucous membranes are moist.   Neck: No stridor. Hematological/Lymphatic/Immunilogical: No cervical lymphadenopathy. Cardiovascular: Normal rate, regular rhythm.  No murmurs, rubs, or gallops. Respiratory: Normal respiratory effort without tachypnea nor retractions. Breath sounds are clear and equal bilaterally. No wheezes/rales/rhonchi. Gastrointestinal: Soft and nontender. No distention.  Genitourinary: Deferred Musculoskeletal: Normal range of motion in all extremities. No joint effusions.  No lower extremity tenderness nor edema. Neurologic:  Normal speech and language. No gross focal neurologic deficits are appreciated.  Skin:  Skin is warm, dry and intact. No rash noted. Psychiatric: Mood and affect are normal. Speech and behavior are normal. Patient exhibits appropriate insight and judgment.  ____________________________________________    LABS (pertinent positives/negatives)  Labs Reviewed  BASIC METABOLIC PANEL - Abnormal; Notable for the following:    Glucose, Bld 127 (*)    All other components within normal limits  CBC - Abnormal; Notable for the following:    WBC 14.8 (*)    MCHC 31.9 (*)    RDW 17.8 (*)    All other components within normal limits  URINALYSIS COMPLETEWITH MICROSCOPIC ([facility] ONLY) - Abnormal; Notable for the following:    Color, Urine YELLOW (*)    APPearance HAZY (*)    Ketones, ur TRACE (*)    Specific Gravity, Urine 1.031 (*)    Hgb urine dipstick 1+ (*)    Protein, ur 100 (*)    Bacteria, UA RARE (*)    Squamous Epithelial / LPF 0-5 (*)    All other components within normal limits  POCT PREGNANCY, URINE  CBG MONITORING, ED     ____________________________________________   EKG  I, Phineas Semen, attending physician, personally viewed and interpreted this EKG  EKG Time: 0214 Rate: 100 Rhythm: sinus tachycardia Axis: normal Intervals: qtc 454 QRS: LVH, narrow ST changes: no st elevation Impression: abnormal  ekg   ____________________________________________    RADIOLOGY  None  ____________________________________________   PROCEDURES  Procedure(s) performed: None  Critical Care performed: No  ____________________________________________   INITIAL IMPRESSION / ASSESSMENT AND PLAN / ED COURSE  Pertinent labs & imaging results that were available during my care of the patient were reviewed by me and considered in my medical decision making (see chart for details).  Patient presented to the emergency department today after an episode of lightheadedness and dizziness. Patient has been having some right sided arm squeezing but it does not sound like this was isolated just to tonight's episode. On my exam there is no concerning focal findings. Initial blood work without concerning findings troponin was added.  ----------------------------------------- 6:56 AM on 06/26/2015 -----------------------------------------  2 sets of troponin now negative. Patient continues to feel better here in the emergency Department. This point unclear etiology of the patient's lightheadedness however does not appear to be ACS. I did have a discussion with the patient. I did recommend she follow up with her primary care pediatrician. She does have a mild leukocytosis but she apparently has baseline leukocytosis. Will discharge home with primary care follow up.   ____________________________________________   FINAL CLINICAL IMPRESSION(S) / ED DIAGNOSES  Final diagnoses:  Dizziness     Phineas Semen, MD 06/26/15 [phone]

## 2015-06-26 NOTE — Discharge Instructions (Signed)
Please seek medical attention for any high fevers, chest pain, shortness of breath, change in behavior, persistent vomiting, bloody stool or any other new or concerning symptoms. ° ° °Dizziness °Dizziness is a common problem. It makes you feel unsteady or lightheaded. You may feel like you are about to pass out (faint). Dizziness can lead to injury if you stumble or fall. Anyone can get dizzy, but dizziness is more common in older adults. This condition can be caused by a number of things, including: °· Medicines. °· Dehydration. °· Illness. °HOME CARE °Following these instructions may help with your condition: °Eating and Drinking °· Drink enough fluid to keep your pee (urine) clear or pale yellow. This helps to keep you from getting dehydrated. Try to drink more clear fluids, such as water. °· Do not drink alcohol. °· Limit how much caffeine you drink or eat if told by your doctor. °· Limit how much salt you drink or eat if told by your doctor. °Activity °· Avoid making quick movements. °¨ When you stand up from sitting in a chair, steady yourself until you feel okay. °¨ In the morning, first sit up on the side of the bed. When you feel okay, stand slowly while you hold onto something. Do this until you know that your balance is fine. °· Move your legs often if you need to stand in one place for a long time. Tighten and relax your muscles in your legs while you are standing. °· Do not drive or use heavy machinery if you feel dizzy. °· Avoid bending down if you feel dizzy. Place items in your home so that they are easy for you to reach without leaning over. °Lifestyle °· Do not use any tobacco products, including cigarettes, chewing tobacco, or electronic cigarettes. If you need help quitting, ask your doctor. °· Try to lower your stress level, such as with yoga or meditation. Talk with your doctor if you need help. °General Instructions °· Watch your dizziness for any changes. °· Take medicines only as told by  your doctor. Talk with your doctor if you think that your dizziness is caused by a medicine that you are taking. °· Tell a friend or a family member that you are feeling dizzy. If he or she notices any changes in your behavior, have this person call your doctor. °· Keep all follow-up visits as told by your doctor. This is important. °GET HELP IF: °· Your dizziness does not go away. °· Your dizziness or light-headedness gets worse. °· You feel sick to your stomach (nauseous). °· You have trouble hearing. °· You have new symptoms. °· You are unsteady on your feet or you feel like the room is spinning. °GET HELP RIGHT AWAY IF: °· You throw up (vomit) or have diarrhea and are unable to eat or drink anything. °· You have trouble: °¨ Talking. °¨ Walking. °¨ Swallowing. °¨ Using your arms, hands, or legs. °· You feel generally weak. °· You are not thinking clearly or you have trouble forming sentences. It may take a friend or family member to notice this. °· You have: °¨ Chest pain. °¨ Pain in your belly (abdomen). °¨ Shortness of breath. °¨ Sweating. °· Your vision changes. °· You are bleeding. °· You have a headache. °· You have neck pain or a stiff neck. °· You have a fever. °  °This information is not intended to replace advice given to you by your health care provider. Make sure you discuss any questions you   have with your health care provider. °  °Document Released: 01/26/2011 Document Revised: 06/23/2014 Document Reviewed: 02/02/2014 °Elsevier Interactive Patient Education ©2016 [employer] ° °

## 2015-06-30 ENCOUNTER — Ambulatory Visit: Payer: 59 | Admitting: Family Medicine

## 2015-07-01 DIAGNOSIS — Q6652 Congenital pes planus, left foot: Secondary | ICD-10-CM | POA: Diagnosis not present

## 2015-07-01 DIAGNOSIS — M25572 Pain in left ankle and joints of left foot: Secondary | ICD-10-CM | POA: Diagnosis not present

## 2015-08-11 ENCOUNTER — Ambulatory Visit: Payer: Self-pay | Admitting: Physician Assistant

## 2015-08-19 ENCOUNTER — Ambulatory Visit: Payer: Self-pay | Admitting: Physician Assistant

## 2015-08-19 ENCOUNTER — Encounter: Payer: Self-pay | Admitting: Physician Assistant

## 2015-08-19 VITALS — BP 110/84 | HR 76 | Temp 98.6°F

## 2015-08-19 DIAGNOSIS — L0390 Cellulitis, unspecified: Principal | ICD-10-CM

## 2015-08-19 DIAGNOSIS — L0291 Cutaneous abscess, unspecified: Secondary | ICD-10-CM

## 2015-08-19 MED ORDER — CLINDAMYCIN HCL 150 MG PO CAPS: 150.0000 mg | ORAL_CAPSULE | Freq: Four times a day (QID) | ORAL | Status: DC

## 2015-08-19 NOTE — Progress Notes (Signed)
S: c/o boils under both arms, hx of same, gets them every summer when she starts sweating more, no fever/chills, no drainage from sites  O: vitals wnl, nad, skin with several boils in axillas b/l, no pus or drainage, areas do not appear "ripe", n/v intact  A: abscess x 2  P: clindamycin 150mg  qid, if worsening return for I and D

## 2015-09-02 DIAGNOSIS — B078 Other viral warts: Secondary | ICD-10-CM | POA: Diagnosis not present

## 2015-09-02 DIAGNOSIS — L300 Nummular dermatitis: Secondary | ICD-10-CM | POA: Diagnosis not present

## 2015-09-20 ENCOUNTER — Ambulatory Visit (INDEPENDENT_AMBULATORY_CARE_PROVIDER_SITE_OTHER): Payer: 59 | Admitting: Family Medicine

## 2015-09-20 ENCOUNTER — Encounter: Payer: Self-pay | Admitting: Family Medicine

## 2015-09-20 VITALS — BP 138/74 | HR 89 | Temp 98.3°F | Resp 18 | Ht 62.0 in | Wt 226.9 lb

## 2015-09-20 DIAGNOSIS — M797 Fibromyalgia: Secondary | ICD-10-CM

## 2015-09-20 DIAGNOSIS — G4726 Circadian rhythm sleep disorder, shift work type: Secondary | ICD-10-CM

## 2015-09-20 DIAGNOSIS — I10 Essential (primary) hypertension: Secondary | ICD-10-CM

## 2015-09-20 DIAGNOSIS — D72829 Elevated white blood cell count, unspecified: Secondary | ICD-10-CM

## 2015-09-20 DIAGNOSIS — F330 Major depressive disorder, recurrent, mild: Secondary | ICD-10-CM | POA: Diagnosis not present

## 2015-09-20 DIAGNOSIS — M545 Low back pain, unspecified: Secondary | ICD-10-CM

## 2015-09-20 DIAGNOSIS — R011 Cardiac murmur, unspecified: Secondary | ICD-10-CM | POA: Diagnosis not present

## 2015-09-20 DIAGNOSIS — G4733 Obstructive sleep apnea (adult) (pediatric): Secondary | ICD-10-CM

## 2015-09-20 MED ORDER — METAXALONE 800 MG PO TABS: 800.0000 mg | ORAL_TABLET | Freq: Three times a day (TID) | ORAL | 2 refills | Status: DC

## 2015-09-20 MED ORDER — DULOXETINE HCL 30 MG PO CPEP: 90.0000 mg | ORAL_CAPSULE | Freq: Every day | ORAL | 1 refills | Status: DC

## 2015-09-20 MED ORDER — OLMESARTAN MEDOXOMIL-HCTZ 20-12.5 MG PO TABS: 1.0000 | ORAL_TABLET | Freq: Every day | ORAL | 1 refills | Status: DC

## 2015-09-20 MED ORDER — PREGABALIN 150 MG PO CAPS: 150.0000 mg | ORAL_CAPSULE | Freq: Two times a day (BID) | ORAL | 2 refills | Status: DC

## 2015-09-20 MED ORDER — ARMODAFINIL 150 MG PO TABS: 150.0000 mg | ORAL_TABLET | Freq: Every day | ORAL | 2 refills | Status: DC

## 2015-09-20 NOTE — Progress Notes (Signed)
Name: Kathleen Conley   MRN: [id number]    DOB: 1970-11-18   Date:09/20/2015       Progress Note  Subjective  Chief Complaint  Chief Complaint  Patient presents with  . Medication Refill    3 month F/U   . Allergic Rhinitis     Well controlled with medications  . Back Pain    starts in middle of back and radiates to tail bone area. Patient states it is more intense at night when at work and pain is constant and does not know if it is her Fibromyalgia or not. Hurts worst when trying to get back up from sitting down.  . Depression    Well controlled   . Fibromyalgia    A little worst with back pain, stiffness, achiness, and soreness.   . Hypertension    Patient states Middle of May she was at work and started feeling bad, co-workers checked BP and it was 170/113 and Pulse was 115. Patient went to [facility] and was told EKG showed PVC's and to follow up with PCP. Patient has not experienced anymore symptoms since.     HPI  FMS: she has noticed an improvement on pain level since she started taking Lyrica in the Fall of 2016, currently on 100  mg twice daily and pain is 5/10, she denies side effects. Able to function, she states pain is debilitant about 3 days of the week - usually the day after she works. She also takes Cymbalta, Skelaxin and Meloxicam. She states her mental fogginess is okay.   HTN: continue Benicar and bp is at goal, no chest pain, SOB or palpitation.  AR: taking otc medication and is doing well, occasionally uses nasal spray, symptoms tend to be worse during the Spring, but is doing well on nasal spray and Zyrtec daily.   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 She feels very tired while working 3rd shift , she has noticed an improvement with Nuvigil. No side effects. Able to stay alert at work.   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 Compliant  with Cymbalta,denies side effects.  She is willing on going up on Cymblata to 90 mg daily   Leukocytosis: she missed appointment with Hematologist but will re-schedule it  Low back pain: she states that about one year ago she helped an elderly man to get up after he fell from his electric scooter. She noticed pain one day later, and pain has been getting progressively worse. No symptoms of radiculitis. She states pain improves when mild spinal flexion, worse with extension.   Patient Active Problem List   Diagnosis Date Noted  . Midline low back pain without sciatica 09/20/2015  . Leukocytosis 11/29/2014  . Osteoarthritis of left knee 11/20/2014  . History of ventral hernia repair 10/27/2014  . Allergic rhinitis 08/17/2014  . Axillary hidradenitis suppurativa 08/17/2014  . Baker cyst 08/17/2014  . Chronic constipation 08/17/2014  . Depression, major, recurrent, mild ([place]) 08/17/2014  . Engages in travel abroad 08/17/2014  . Fibromyalgia 08/17/2014  . Cardiac murmur 08/17/2014  . Anemia, iron deficiency 08/17/2014  . Excessive and frequent menstruation 08/17/2014  . Dysmetabolic syndrome 59/29/2446  . Plantar fasciitis 08/17/2014  . Beat, premature ventricular 08/17/2014  . 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 Obstructive apnea 12/02/2013  . Essential (primary) hypertension 12/09/2009  .  Hypertrichosis 12/09/2009  . Extreme obesity ([place]) 12/09/2009    Past Surgical History:  Procedure Laterality Date  . HERNIA REPAIR  10/09/11   Ventral Incarcerated- Dr. Pat Patrick  . OOPHORECTOMY Left 12/09/2009  . OOPHORECTOMY Left   . TUBAL LIGATION  1993  . VENTRAL HERNIA REPAIR  10/09/2011    Family History  Problem Relation Age of Onset  . Breast cancer Paternal Grandmother   . Heart disease Father   . Hypertension Father   . Diabetes Father   . Hypertension Mother   . CVA Mother   . Kidney disease Mother   . Congenital heart disease  Mother   . Colon cancer Maternal Grandfather   . Colon cancer Maternal Uncle     Social History   Social History  . Marital status: Single    Spouse name: N/A  . Number of children: 3  . Years of education: Associates   Occupational History  . 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 Smoking status: Never Smoker  . Smokeless tobacco: Never Used  . Alcohol use No  . Drug use: No  . Sexual activity: Not on file   Other Topics Concern  . Not on file   Social History Narrative  . No narrative on file     Current Outpatient Prescriptions:  .  acetaminophen (TYLENOL) 500 MG tablet, Take 1 tablet (500 mg total) by mouth every 6 (six) hours as needed. Max of 3 grams daily or 6 pills dialy, Disp: 30 tablet, Rfl: 0 .  Armodafinil (NUVIGIL) 150 MG tablet, Take 1 tablet (150 mg total) by mouth daily., Disp: 30 tablet, Rfl: 2 .  cetirizine (ZYRTEC) 10 MG tablet, Take 1 tablet by mouth daily., Disp: , Rfl:  .  Cholecalciferol (VITAMIN D) 2000 UNITS tablet, Take 1 tablet by mouth daily., Disp: , Rfl:  .  docusate sodium (COLACE) 100 MG capsule, Take 1 tablet by mouth as needed., Disp: , Rfl:  .  DULoxetine (CYMBALTA) 30 MG capsule, Take 3 capsules (90 mg total) by mouth daily., Disp: 270 capsule, Rfl: 1 .  ferrous sulfate 325 (65 FE) MG tablet, Take 1 tablet by mouth daily., Disp: , Rfl:  .  fluticasone (FLONASE) 50 MCG/ACT nasal spray, Place 2 sprays into the nose daily., Disp: , Rfl:  .  levonorgestrel (MIRENA, 52 MG,) 20 MCG/24HR IUD, 1 each by Intrauterine route., Disp: , Rfl:  .  metaxalone (SKELAXIN) 800 MG tablet, Take 1 tablet (800 mg total) by mouth 3 (three) times daily., Disp: 90 tablet, Rfl: 2 .  olmesartan-hydrochlorothiazide (BENICAR HCT) 20-12.5 MG tablet, Take 1 tablet by mouth daily., Disp: 90 tablet, Rfl: 1 .  pregabalin (LYRICA) 150 MG capsule, Take 1 capsule (150 mg total) by mouth 2 (two) times daily., Disp: 60 capsule, Rfl: 2  No Known  Allergies   ROS  Constitutional: Negative for fever or weight change.  Respiratory: Negative for cough and shortness of breath.   Cardiovascular: Negative for chest pain or palpitations.  Gastrointestinal: Negative for abdominal pain, no bowel changes.  Musculoskeletal: Negative for gait problem or joint swelling.  Skin: Positive  for rash. Seen by [facility] dermatologist and is using a cream Neurological: Negative for dizziness or headache.  No other specific complaints in a complete review of systems (except as listed in HPI above).  Objective  Vitals:   09/20/15 1343 09/20/15 1417 09/20/15 1424  BP: (!) 142/76  138/74  Pulse: (!) 110  86 89  Resp: 18    Temp: 98.3 F (36.8 C)    TempSrc: Oral    SpO2: 97%    Weight: 226 lb 14.4 oz (102.9 kg)    Height: '5\' 2"'  (1.575 m)      Body mass index is 41.5 kg/m.  Physical Exam  Constitutional: Patient appears well-developed and well-nourished. Obese No distress.  HEENT: head atraumatic, normocephalic, pupils equal and reactive to light,neck supple, throat within normal limits Cardiovascular: Normal rate, regular rhythm and normal heart sounds.  Heart murmur 2-3/6 on right 2nd intercostal space, musical, holosystolic. No BLE edema. Pulmonary/Chest: Effort normal and breath sounds normal. No respiratory distress. Abdominal: Soft.  There is no tenderness. Psychiatric: Patient has a normal mood and affect. behavior is normal. Judgment and thought content normal. Muscular Skeletal: pain during palpation of lumbar spine, decrease rom, worse on extension, trigger points positive   Recent Results (from the past 2160 hour(s))  Basic metabolic panel     Status: Abnormal   Collection Time: 06/26/15  2:17 AM  Result Value Ref Range   Sodium 141 135 - 145 mmol/L   Potassium 3.6 3.5 - 5.1 mmol/L   Chloride 108 101 - 111 mmol/L   CO2 24 22 - 32 mmol/L   Glucose, Bld 127 (H) 65 - 99 mg/dL   BUN 12 6 - 20 mg/dL   Creatinine, Ser 0.69 0.44 -  1.00 mg/dL   Calcium 9.7 8.9 - 10.3 mg/dL   GFR calc non Af Amer >60 >60 mL/min   GFR calc Af Amer >60 >60 mL/min    Comment: (NOTE) The eGFR has been calculated using the CKD EPI equation. This calculation has not been validated in all clinical situations. eGFR's persistently <60 mL/min signify possible Chronic Kidney Disease.    Anion gap 9 5 - 15  CBC     Status: Abnormal   Collection Time: 06/26/15  2:17 AM  Result Value Ref Range   WBC 14.8 (H) 3.6 - 11.0 K/uL   RBC 4.73 3.80 - 5.20 MIL/uL   Hemoglobin 12.4 12.0 - 16.0 g/dL   HCT 38.8 35.0 - 47.0 %   MCV 82.0 80.0 - 100.0 fL   MCH 26.2 26.0 - 34.0 pg   MCHC 31.9 (L) 32.0 - 36.0 g/dL   RDW 17.8 (H) 11.5 - 14.5 %   Platelets 312 150 - 440 K/uL  Urinalysis complete, with microscopic ([facility] only)     Status: Abnormal   Collection Time: 06/26/15  2:17 AM  Result Value Ref Range   Color, Urine YELLOW (A) YELLOW   APPearance HAZY (A) CLEAR   Glucose, UA NEGATIVE NEGATIVE mg/dL   Bilirubin Urine NEGATIVE NEGATIVE   Ketones, ur TRACE (A) NEGATIVE mg/dL   Specific Gravity, Urine 1.031 (H) 1.005 - 1.030   Hgb urine dipstick 1+ (A) NEGATIVE   pH 5.0 5.0 - 8.0   Protein, ur 100 (A) NEGATIVE mg/dL   Nitrite NEGATIVE NEGATIVE   Leukocytes, UA NEGATIVE NEGATIVE   RBC / HPF 6-30 0 - 5 RBC/hpf   WBC, UA 0-5 0 - 5 WBC/hpf   Bacteria, UA RARE (A) NONE SEEN   Squamous Epithelial / LPF 0-5 (A) NONE SEEN   Mucous PRESENT    Hyaline Casts, UA PRESENT    Ca Oxalate Crys, UA PRESENT   Troponin I     Status: None   Collection Time: 06/26/15  2:17 AM  Result Value Ref Range   Troponin I <0.03 <0.031  ng/mL    Comment:        NO INDICATION OF MYOCARDIAL INJURY.   Pregnancy, urine POC     Status: None   Collection Time: 06/26/15  2:36 AM  Result Value Ref Range   Preg Test, Ur NEGATIVE NEGATIVE    Comment:        THE SENSITIVITY OF THIS METHODOLOGY IS >24 mIU/mL   Troponin I     Status: None   Collection Time: 06/26/15  5:53 AM   Result Value Ref Range   Troponin I <0.03 <0.031 ng/mL    Comment:        NO INDICATION OF MYOCARDIAL INJURY.      PHQ2/9: Depression screen [facility] 2/9 09/20/2015 06/18/2015 03/22/2015 11/20/2014 08/20/2014  Decreased Interest 0 0 0 0 1  Down, Depressed, Hopeless 0 0 0 0 0  PHQ - 2 Score 0 0 0 0 1     Fall Risk: Fall Risk  09/20/2015 06/18/2015 03/22/2015 11/20/2014 08/20/2014  Falls in the past year? No No No No No      Assessment & Plan  1. Essential (primary) hypertension  BP elevated initially but improved before she left - olmesartan-hydrochlorothiazide (BENICAR HCT) 20-12.5 MG tablet; Take 1 tablet by mouth daily.  Dispense: 90 tablet; Refill: 1  2. Obstructive apnea  - Armodafinil (NUVIGIL) 150 MG tablet; Take 1 tablet (150 mg total) by mouth daily.  Dispense: 30 tablet; Refill: 2  3. Fibromyalgia  - pregabalin (LYRICA) 150 MG capsule; Take 1 capsule (150 mg total) by mouth 2 (two) times daily.  Dispense: 60 capsule; Refill: 2 - metaxalone (SKELAXIN) 800 MG tablet; Take 1 tablet (800 mg total) by mouth 3 (three) times daily.  Dispense: 90 tablet; Refill: 2 - Ambulatory referral to Chiropractic - DULoxetine (CYMBALTA) 30 MG capsule; Take 3 capsules (90 mg total) by mouth daily.  Dispense: 270 capsule; Refill: 1  4. Depression, major, recurrent, mild (HCC)  - DULoxetine (CYMBALTA) 30 MG capsule; Take 3 capsules (90 mg total) by mouth daily.  Dispense: 270 capsule; Refill: 1  5. Shift work sleep disorder  - Armodafinil (NUVIGIL) 150 MG tablet; Take 1 tablet (150 mg total) by mouth daily.  Dispense: 30 tablet; Refill: 2  6. Leukocytosis  Explained importance of seeing hematologist  7. Midline low back pain without sciatica  She would like to have X-ray at [place] office to decrease cost. Prefers than seeing PT. - Ambulatory referral to Chiropractic   8. Cardiac murmur  - ECHOCARDIOGRAM LIMITED; Future

## 2015-09-30 DIAGNOSIS — M5416 Radiculopathy, lumbar region: Secondary | ICD-10-CM | POA: Diagnosis not present

## 2015-09-30 DIAGNOSIS — M9903 Segmental and somatic dysfunction of lumbar region: Secondary | ICD-10-CM | POA: Diagnosis not present

## 2015-09-30 DIAGNOSIS — M955 Acquired deformity of pelvis: Secondary | ICD-10-CM | POA: Diagnosis not present

## 2015-09-30 DIAGNOSIS — M9905 Segmental and somatic dysfunction of pelvic region: Secondary | ICD-10-CM | POA: Diagnosis not present

## 2015-10-01 DIAGNOSIS — M955 Acquired deformity of pelvis: Secondary | ICD-10-CM | POA: Diagnosis not present

## 2015-10-01 DIAGNOSIS — M9903 Segmental and somatic dysfunction of lumbar region: Secondary | ICD-10-CM | POA: Diagnosis not present

## 2015-10-01 DIAGNOSIS — M9905 Segmental and somatic dysfunction of pelvic region: Secondary | ICD-10-CM | POA: Diagnosis not present

## 2015-10-01 DIAGNOSIS — M5416 Radiculopathy, lumbar region: Secondary | ICD-10-CM | POA: Diagnosis not present

## 2015-10-05 DIAGNOSIS — M955 Acquired deformity of pelvis: Secondary | ICD-10-CM | POA: Diagnosis not present

## 2015-10-05 DIAGNOSIS — M9905 Segmental and somatic dysfunction of pelvic region: Secondary | ICD-10-CM | POA: Diagnosis not present

## 2015-10-05 DIAGNOSIS — M9903 Segmental and somatic dysfunction of lumbar region: Secondary | ICD-10-CM | POA: Diagnosis not present

## 2015-10-05 DIAGNOSIS — M5416 Radiculopathy, lumbar region: Secondary | ICD-10-CM | POA: Diagnosis not present

## 2015-10-06 DIAGNOSIS — M9903 Segmental and somatic dysfunction of lumbar region: Secondary | ICD-10-CM | POA: Diagnosis not present

## 2015-10-06 DIAGNOSIS — M955 Acquired deformity of pelvis: Secondary | ICD-10-CM | POA: Diagnosis not present

## 2015-10-06 DIAGNOSIS — M5416 Radiculopathy, lumbar region: Secondary | ICD-10-CM | POA: Diagnosis not present

## 2015-10-06 DIAGNOSIS — M9905 Segmental and somatic dysfunction of pelvic region: Secondary | ICD-10-CM | POA: Diagnosis not present

## 2015-10-07 DIAGNOSIS — M9905 Segmental and somatic dysfunction of pelvic region: Secondary | ICD-10-CM | POA: Diagnosis not present

## 2015-10-07 DIAGNOSIS — M9903 Segmental and somatic dysfunction of lumbar region: Secondary | ICD-10-CM | POA: Diagnosis not present

## 2015-10-07 DIAGNOSIS — M955 Acquired deformity of pelvis: Secondary | ICD-10-CM | POA: Diagnosis not present

## 2015-10-07 DIAGNOSIS — M5416 Radiculopathy, lumbar region: Secondary | ICD-10-CM | POA: Diagnosis not present

## 2015-10-08 DIAGNOSIS — Z01419 Encounter for gynecological examination (general) (routine) without abnormal findings: Secondary | ICD-10-CM | POA: Diagnosis not present

## 2015-10-08 DIAGNOSIS — Z6841 Body Mass Index (BMI) 40.0 and over, adult: Secondary | ICD-10-CM | POA: Diagnosis not present

## 2015-10-08 DIAGNOSIS — E669 Obesity, unspecified: Secondary | ICD-10-CM | POA: Diagnosis not present

## 2015-10-12 DIAGNOSIS — M9903 Segmental and somatic dysfunction of lumbar region: Secondary | ICD-10-CM | POA: Diagnosis not present

## 2015-10-12 DIAGNOSIS — M9905 Segmental and somatic dysfunction of pelvic region: Secondary | ICD-10-CM | POA: Diagnosis not present

## 2015-10-12 DIAGNOSIS — M5416 Radiculopathy, lumbar region: Secondary | ICD-10-CM | POA: Diagnosis not present

## 2015-10-12 DIAGNOSIS — M955 Acquired deformity of pelvis: Secondary | ICD-10-CM | POA: Diagnosis not present

## 2015-10-13 ENCOUNTER — Other Ambulatory Visit: Payer: Self-pay

## 2015-10-13 ENCOUNTER — Ambulatory Visit (INDEPENDENT_AMBULATORY_CARE_PROVIDER_SITE_OTHER): Payer: 59

## 2015-10-13 DIAGNOSIS — R011 Cardiac murmur, unspecified: Secondary | ICD-10-CM | POA: Diagnosis not present

## 2015-10-13 DIAGNOSIS — M9905 Segmental and somatic dysfunction of pelvic region: Secondary | ICD-10-CM | POA: Diagnosis not present

## 2015-10-13 DIAGNOSIS — M9903 Segmental and somatic dysfunction of lumbar region: Secondary | ICD-10-CM | POA: Diagnosis not present

## 2015-10-13 DIAGNOSIS — M955 Acquired deformity of pelvis: Secondary | ICD-10-CM | POA: Diagnosis not present

## 2015-10-13 DIAGNOSIS — M5416 Radiculopathy, lumbar region: Secondary | ICD-10-CM | POA: Diagnosis not present

## 2015-10-14 ENCOUNTER — Encounter: Payer: Self-pay | Admitting: Family Medicine

## 2015-10-14 DIAGNOSIS — M5416 Radiculopathy, lumbar region: Secondary | ICD-10-CM | POA: Diagnosis not present

## 2015-10-14 DIAGNOSIS — M9903 Segmental and somatic dysfunction of lumbar region: Secondary | ICD-10-CM | POA: Diagnosis not present

## 2015-10-14 DIAGNOSIS — M9905 Segmental and somatic dysfunction of pelvic region: Secondary | ICD-10-CM | POA: Diagnosis not present

## 2015-10-14 DIAGNOSIS — IMO0001 Reserved for inherently not codable concepts without codable children: Secondary | ICD-10-CM | POA: Insufficient documentation

## 2015-10-14 DIAGNOSIS — M955 Acquired deformity of pelvis: Secondary | ICD-10-CM | POA: Diagnosis not present

## 2015-10-18 DIAGNOSIS — M955 Acquired deformity of pelvis: Secondary | ICD-10-CM | POA: Diagnosis not present

## 2015-10-18 DIAGNOSIS — M9903 Segmental and somatic dysfunction of lumbar region: Secondary | ICD-10-CM | POA: Diagnosis not present

## 2015-10-18 DIAGNOSIS — M5416 Radiculopathy, lumbar region: Secondary | ICD-10-CM | POA: Diagnosis not present

## 2015-10-18 DIAGNOSIS — M9905 Segmental and somatic dysfunction of pelvic region: Secondary | ICD-10-CM | POA: Diagnosis not present

## 2015-10-18 DIAGNOSIS — H524 Presbyopia: Secondary | ICD-10-CM | POA: Diagnosis not present

## 2015-10-19 DIAGNOSIS — M5416 Radiculopathy, lumbar region: Secondary | ICD-10-CM | POA: Diagnosis not present

## 2015-10-19 DIAGNOSIS — M9905 Segmental and somatic dysfunction of pelvic region: Secondary | ICD-10-CM | POA: Diagnosis not present

## 2015-10-19 DIAGNOSIS — M9903 Segmental and somatic dysfunction of lumbar region: Secondary | ICD-10-CM | POA: Diagnosis not present

## 2015-10-19 DIAGNOSIS — M955 Acquired deformity of pelvis: Secondary | ICD-10-CM | POA: Diagnosis not present

## 2015-10-22 DIAGNOSIS — M955 Acquired deformity of pelvis: Secondary | ICD-10-CM | POA: Diagnosis not present

## 2015-10-22 DIAGNOSIS — M9905 Segmental and somatic dysfunction of pelvic region: Secondary | ICD-10-CM | POA: Diagnosis not present

## 2015-10-22 DIAGNOSIS — M9903 Segmental and somatic dysfunction of lumbar region: Secondary | ICD-10-CM | POA: Diagnosis not present

## 2015-10-22 DIAGNOSIS — M5416 Radiculopathy, lumbar region: Secondary | ICD-10-CM | POA: Diagnosis not present

## 2015-10-27 DIAGNOSIS — M955 Acquired deformity of pelvis: Secondary | ICD-10-CM | POA: Diagnosis not present

## 2015-10-27 DIAGNOSIS — M9905 Segmental and somatic dysfunction of pelvic region: Secondary | ICD-10-CM | POA: Diagnosis not present

## 2015-10-27 DIAGNOSIS — M9903 Segmental and somatic dysfunction of lumbar region: Secondary | ICD-10-CM | POA: Diagnosis not present

## 2015-10-27 DIAGNOSIS — M5416 Radiculopathy, lumbar region: Secondary | ICD-10-CM | POA: Diagnosis not present

## 2015-10-28 DIAGNOSIS — M5416 Radiculopathy, lumbar region: Secondary | ICD-10-CM | POA: Diagnosis not present

## 2015-10-28 DIAGNOSIS — M955 Acquired deformity of pelvis: Secondary | ICD-10-CM | POA: Diagnosis not present

## 2015-10-28 DIAGNOSIS — M9905 Segmental and somatic dysfunction of pelvic region: Secondary | ICD-10-CM | POA: Diagnosis not present

## 2015-10-28 DIAGNOSIS — M9903 Segmental and somatic dysfunction of lumbar region: Secondary | ICD-10-CM | POA: Diagnosis not present

## 2015-11-04 DIAGNOSIS — M955 Acquired deformity of pelvis: Secondary | ICD-10-CM | POA: Diagnosis not present

## 2015-11-04 DIAGNOSIS — M9903 Segmental and somatic dysfunction of lumbar region: Secondary | ICD-10-CM | POA: Diagnosis not present

## 2015-11-04 DIAGNOSIS — M9905 Segmental and somatic dysfunction of pelvic region: Secondary | ICD-10-CM | POA: Diagnosis not present

## 2015-11-04 DIAGNOSIS — M5416 Radiculopathy, lumbar region: Secondary | ICD-10-CM | POA: Diagnosis not present

## 2015-11-05 DIAGNOSIS — M5416 Radiculopathy, lumbar region: Secondary | ICD-10-CM | POA: Diagnosis not present

## 2015-11-05 DIAGNOSIS — M9905 Segmental and somatic dysfunction of pelvic region: Secondary | ICD-10-CM | POA: Diagnosis not present

## 2015-11-05 DIAGNOSIS — M955 Acquired deformity of pelvis: Secondary | ICD-10-CM | POA: Diagnosis not present

## 2015-11-05 DIAGNOSIS — M9903 Segmental and somatic dysfunction of lumbar region: Secondary | ICD-10-CM | POA: Diagnosis not present

## 2015-11-09 DIAGNOSIS — M9903 Segmental and somatic dysfunction of lumbar region: Secondary | ICD-10-CM | POA: Diagnosis not present

## 2015-11-09 DIAGNOSIS — M5416 Radiculopathy, lumbar region: Secondary | ICD-10-CM | POA: Diagnosis not present

## 2015-11-09 DIAGNOSIS — M9905 Segmental and somatic dysfunction of pelvic region: Secondary | ICD-10-CM | POA: Diagnosis not present

## 2015-11-09 DIAGNOSIS — M955 Acquired deformity of pelvis: Secondary | ICD-10-CM | POA: Diagnosis not present

## 2015-11-11 DIAGNOSIS — M955 Acquired deformity of pelvis: Secondary | ICD-10-CM | POA: Diagnosis not present

## 2015-11-11 DIAGNOSIS — M5416 Radiculopathy, lumbar region: Secondary | ICD-10-CM | POA: Diagnosis not present

## 2015-11-11 DIAGNOSIS — M9903 Segmental and somatic dysfunction of lumbar region: Secondary | ICD-10-CM | POA: Diagnosis not present

## 2015-11-11 DIAGNOSIS — M9905 Segmental and somatic dysfunction of pelvic region: Secondary | ICD-10-CM | POA: Diagnosis not present

## 2015-11-16 DIAGNOSIS — M5416 Radiculopathy, lumbar region: Secondary | ICD-10-CM | POA: Diagnosis not present

## 2015-11-16 DIAGNOSIS — M9905 Segmental and somatic dysfunction of pelvic region: Secondary | ICD-10-CM | POA: Diagnosis not present

## 2015-11-16 DIAGNOSIS — M9903 Segmental and somatic dysfunction of lumbar region: Secondary | ICD-10-CM | POA: Diagnosis not present

## 2015-11-16 DIAGNOSIS — M955 Acquired deformity of pelvis: Secondary | ICD-10-CM | POA: Diagnosis not present

## 2015-11-19 DIAGNOSIS — M9905 Segmental and somatic dysfunction of pelvic region: Secondary | ICD-10-CM | POA: Diagnosis not present

## 2015-11-19 DIAGNOSIS — M9903 Segmental and somatic dysfunction of lumbar region: Secondary | ICD-10-CM | POA: Diagnosis not present

## 2015-11-19 DIAGNOSIS — M955 Acquired deformity of pelvis: Secondary | ICD-10-CM | POA: Diagnosis not present

## 2015-11-19 DIAGNOSIS — M5416 Radiculopathy, lumbar region: Secondary | ICD-10-CM | POA: Diagnosis not present

## 2015-11-23 DIAGNOSIS — M9903 Segmental and somatic dysfunction of lumbar region: Secondary | ICD-10-CM | POA: Diagnosis not present

## 2015-11-23 DIAGNOSIS — M5416 Radiculopathy, lumbar region: Secondary | ICD-10-CM | POA: Diagnosis not present

## 2015-11-23 DIAGNOSIS — M9905 Segmental and somatic dysfunction of pelvic region: Secondary | ICD-10-CM | POA: Diagnosis not present

## 2015-11-23 DIAGNOSIS — M955 Acquired deformity of pelvis: Secondary | ICD-10-CM | POA: Diagnosis not present

## 2015-11-25 ENCOUNTER — Other Ambulatory Visit: Payer: Self-pay | Admitting: Obstetrics and Gynecology

## 2015-11-25 DIAGNOSIS — M955 Acquired deformity of pelvis: Secondary | ICD-10-CM | POA: Diagnosis not present

## 2015-11-25 DIAGNOSIS — M9905 Segmental and somatic dysfunction of pelvic region: Secondary | ICD-10-CM | POA: Diagnosis not present

## 2015-11-25 DIAGNOSIS — Z1231 Encounter for screening mammogram for malignant neoplasm of breast: Secondary | ICD-10-CM

## 2015-11-25 DIAGNOSIS — M5416 Radiculopathy, lumbar region: Secondary | ICD-10-CM | POA: Diagnosis not present

## 2015-11-25 DIAGNOSIS — M9903 Segmental and somatic dysfunction of lumbar region: Secondary | ICD-10-CM | POA: Diagnosis not present

## 2015-12-02 DIAGNOSIS — M955 Acquired deformity of pelvis: Secondary | ICD-10-CM | POA: Diagnosis not present

## 2015-12-02 DIAGNOSIS — M9903 Segmental and somatic dysfunction of lumbar region: Secondary | ICD-10-CM | POA: Diagnosis not present

## 2015-12-02 DIAGNOSIS — M5416 Radiculopathy, lumbar region: Secondary | ICD-10-CM | POA: Diagnosis not present

## 2015-12-02 DIAGNOSIS — M9905 Segmental and somatic dysfunction of pelvic region: Secondary | ICD-10-CM | POA: Diagnosis not present

## 2015-12-07 DIAGNOSIS — M5416 Radiculopathy, lumbar region: Secondary | ICD-10-CM | POA: Diagnosis not present

## 2015-12-07 DIAGNOSIS — M9903 Segmental and somatic dysfunction of lumbar region: Secondary | ICD-10-CM | POA: Diagnosis not present

## 2015-12-07 DIAGNOSIS — M955 Acquired deformity of pelvis: Secondary | ICD-10-CM | POA: Diagnosis not present

## 2015-12-07 DIAGNOSIS — M9905 Segmental and somatic dysfunction of pelvic region: Secondary | ICD-10-CM | POA: Diagnosis not present

## 2015-12-09 DIAGNOSIS — M955 Acquired deformity of pelvis: Secondary | ICD-10-CM | POA: Diagnosis not present

## 2015-12-09 DIAGNOSIS — M5416 Radiculopathy, lumbar region: Secondary | ICD-10-CM | POA: Diagnosis not present

## 2015-12-09 DIAGNOSIS — M9903 Segmental and somatic dysfunction of lumbar region: Secondary | ICD-10-CM | POA: Diagnosis not present

## 2015-12-09 DIAGNOSIS — M9905 Segmental and somatic dysfunction of pelvic region: Secondary | ICD-10-CM | POA: Diagnosis not present

## 2015-12-16 ENCOUNTER — Encounter: Payer: Self-pay | Admitting: Physician Assistant

## 2015-12-16 ENCOUNTER — Ambulatory Visit: Payer: Self-pay | Admitting: Physician Assistant

## 2015-12-16 VITALS — BP 160/90 | HR 78 | Temp 98.5°F

## 2015-12-16 DIAGNOSIS — L02412 Cutaneous abscess of left axilla: Secondary | ICD-10-CM

## 2015-12-16 MED ORDER — CLINDAMYCIN HCL 300 MG PO CAPS: 300.0000 mg | ORAL_CAPSULE | Freq: Three times a day (TID) | ORAL | 0 refills | Status: DC

## 2015-12-16 NOTE — Progress Notes (Signed)
S: c/o abscess in left axilla, states she gets these often but has been a while since the last, area is red and painful, no fever/chills, no known injury  O: vitals wnl, nad, skin in axilla with quarter sized abscess, nonfluctuant, no drainage, n/v intact  A: abscess  P: clindamycin 300mg  tid x 7d, warm compress, return if worsening

## 2015-12-20 ENCOUNTER — Ambulatory Visit: Payer: 59 | Attending: Obstetrics and Gynecology

## 2015-12-24 ENCOUNTER — Ambulatory Visit: Payer: 59 | Admitting: Family Medicine

## 2016-01-11 DIAGNOSIS — M25571 Pain in right ankle and joints of right foot: Secondary | ICD-10-CM | POA: Diagnosis not present

## 2016-01-11 DIAGNOSIS — M216X2 Other acquired deformities of left foot: Secondary | ICD-10-CM | POA: Diagnosis not present

## 2016-01-11 DIAGNOSIS — Q6652 Congenital pes planus, left foot: Secondary | ICD-10-CM | POA: Diagnosis not present

## 2016-01-13 IMAGING — CR DG CHEST 2V
1 series · 2 of 2 positions shown · non-contrast
Comparison: 10/09/2011

CLINICAL DATA: Nonproductive cough, possible sleep apnea

EXAM:
CHEST  2 VIEW

[Series 1: kdxr chest pa (or ap) and lat · 0.14mm/px · 2 of 2 slices shown]
[im 1/2]
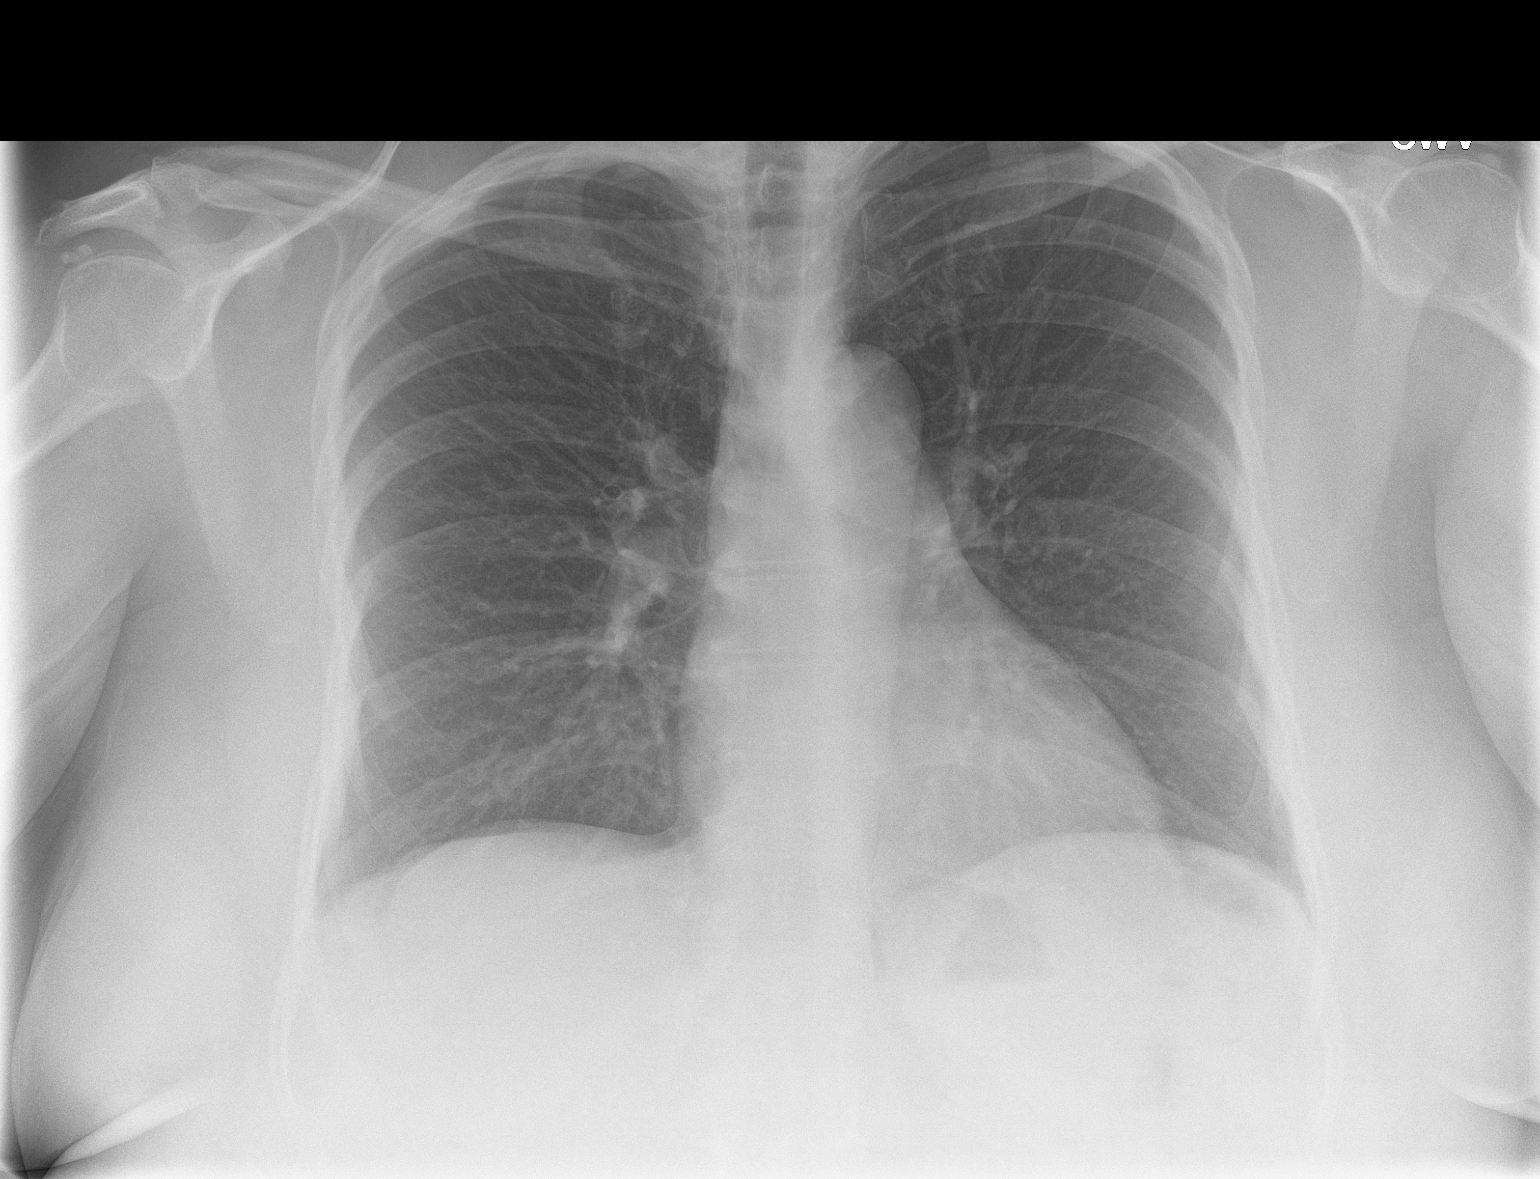
[im 2/2]
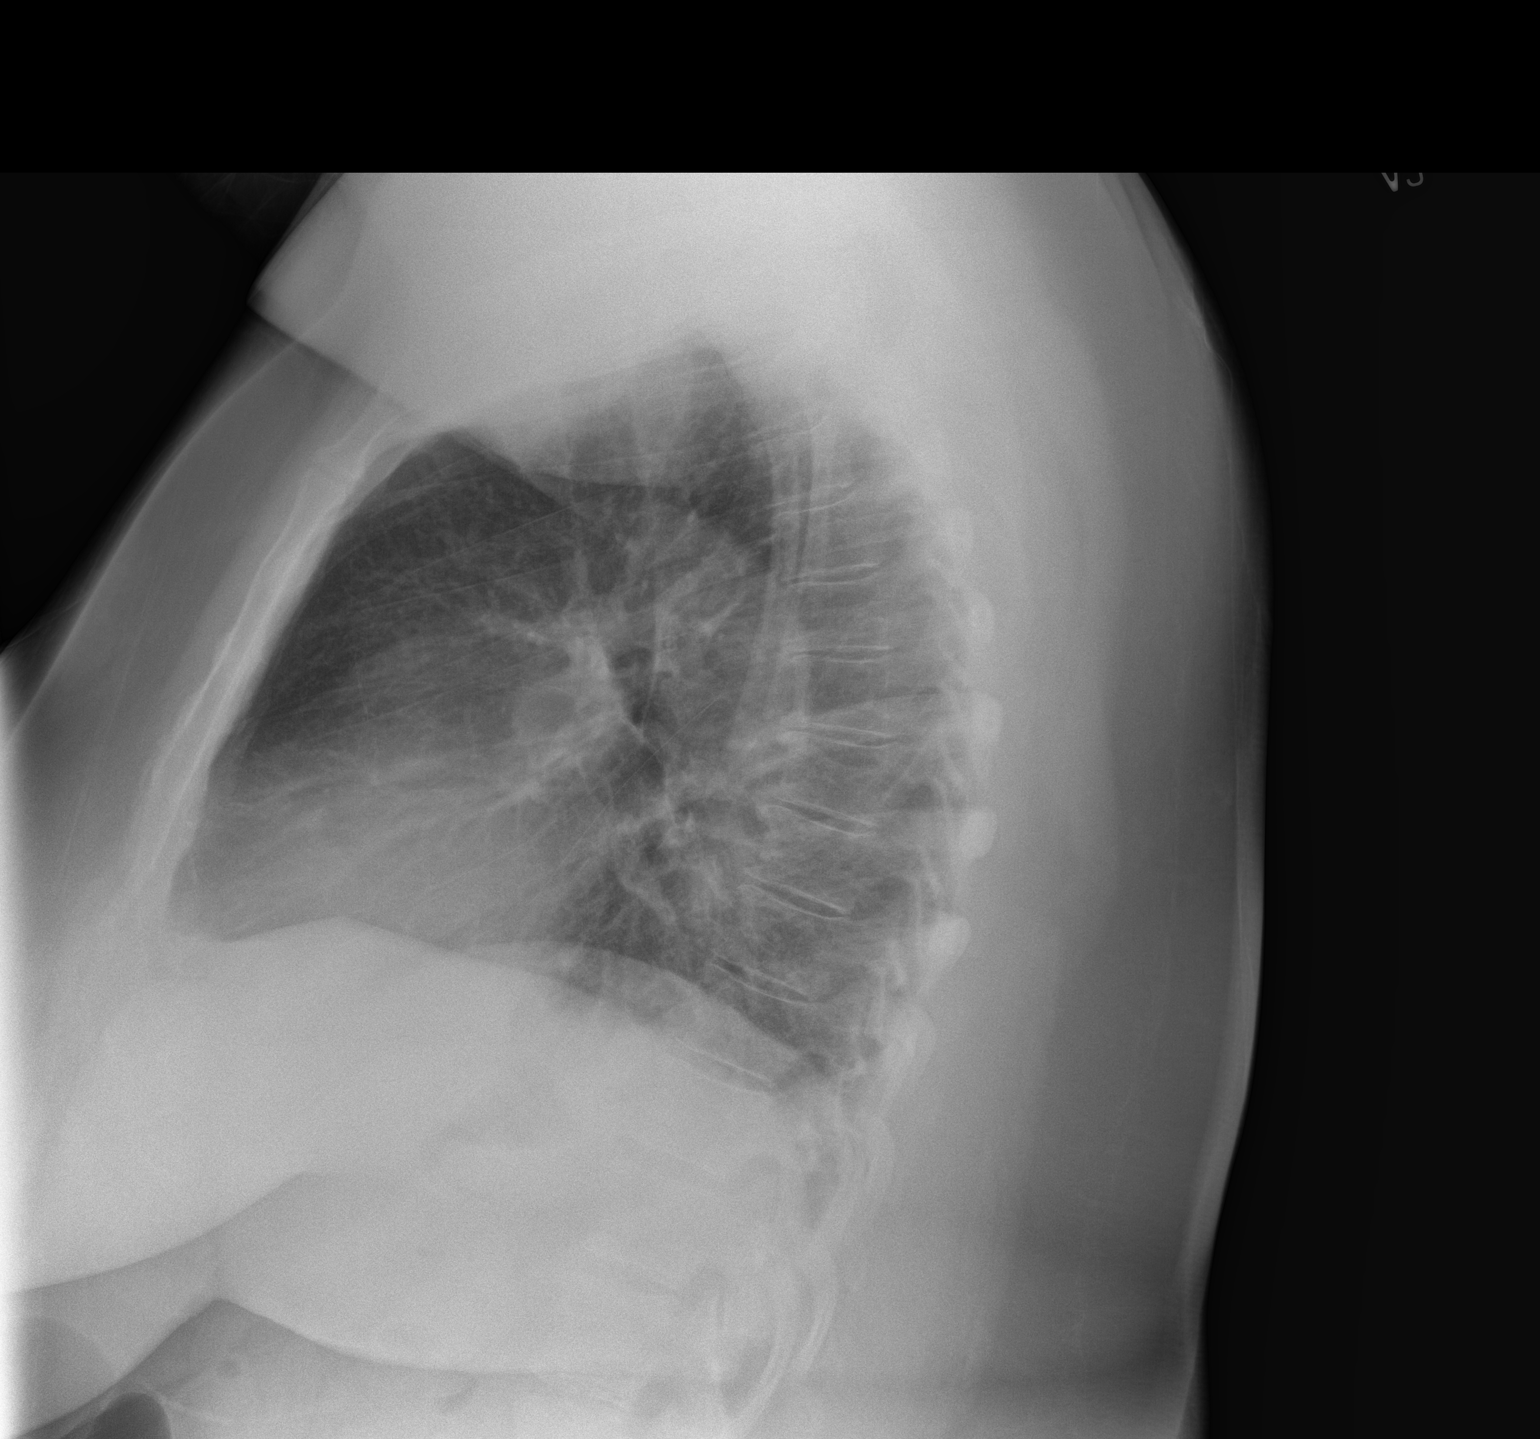

[2 of 2 positions shown; findings below may reference images not displayed]

FINDINGS: Cardiomediastinal silhouette is stable. No acute infiltrate or
pleural effusion. No pulmonary edema. Mild degenerative changes mid
thoracic spine.
IMPRESSION: No active cardiopulmonary disease.

## 2016-01-28 ENCOUNTER — Ambulatory Visit (INDEPENDENT_AMBULATORY_CARE_PROVIDER_SITE_OTHER): Payer: 59 | Admitting: Family Medicine

## 2016-01-28 ENCOUNTER — Encounter: Payer: Self-pay | Admitting: Family Medicine

## 2016-01-28 VITALS — BP 138/88 | HR 95 | Temp 98.8°F | Resp 16 | Ht 62.0 in | Wt 238.8 lb

## 2016-01-28 DIAGNOSIS — Z13220 Encounter for screening for lipoid disorders: Secondary | ICD-10-CM | POA: Diagnosis not present

## 2016-01-28 DIAGNOSIS — L732 Hidradenitis suppurativa: Secondary | ICD-10-CM

## 2016-01-28 DIAGNOSIS — M2142 Flat foot [pes planus] (acquired), left foot: Secondary | ICD-10-CM

## 2016-01-28 DIAGNOSIS — M545 Low back pain, unspecified: Secondary | ICD-10-CM

## 2016-01-28 DIAGNOSIS — G4733 Obstructive sleep apnea (adult) (pediatric): Secondary | ICD-10-CM | POA: Diagnosis not present

## 2016-01-28 DIAGNOSIS — M25571 Pain in right ankle and joints of right foot: Secondary | ICD-10-CM | POA: Diagnosis not present

## 2016-01-28 DIAGNOSIS — E8881 Metabolic syndrome: Secondary | ICD-10-CM

## 2016-01-28 DIAGNOSIS — G4726 Circadian rhythm sleep disorder, shift work type: Secondary | ICD-10-CM | POA: Diagnosis not present

## 2016-01-28 DIAGNOSIS — D72829 Elevated white blood cell count, unspecified: Secondary | ICD-10-CM

## 2016-01-28 DIAGNOSIS — F330 Major depressive disorder, recurrent, mild: Secondary | ICD-10-CM | POA: Diagnosis not present

## 2016-01-28 DIAGNOSIS — M2141 Flat foot [pes planus] (acquired), right foot: Secondary | ICD-10-CM

## 2016-01-28 DIAGNOSIS — M2140 Flat foot [pes planus] (acquired), unspecified foot: Secondary | ICD-10-CM | POA: Insufficient documentation

## 2016-01-28 DIAGNOSIS — Z8639 Personal history of other endocrine, nutritional and metabolic disease: Secondary | ICD-10-CM | POA: Diagnosis not present

## 2016-01-28 DIAGNOSIS — M797 Fibromyalgia: Secondary | ICD-10-CM

## 2016-01-28 DIAGNOSIS — I10 Essential (primary) hypertension: Secondary | ICD-10-CM

## 2016-01-28 MED ORDER — ASPIRIN EC 81 MG PO TBEC: 81.0000 mg | DELAYED_RELEASE_TABLET | Freq: Every day | ORAL | 0 refills | Status: DC

## 2016-01-28 MED ORDER — OLMESARTAN MEDOXOMIL-HCTZ 20-12.5 MG PO TABS: 1.0000 | ORAL_TABLET | Freq: Every day | ORAL | 1 refills | Status: DC

## 2016-01-28 MED ORDER — DULOXETINE HCL 30 MG PO CPEP: 90.0000 mg | ORAL_CAPSULE | Freq: Every day | ORAL | 1 refills | Status: DC

## 2016-01-28 MED ORDER — ARMODAFINIL 150 MG PO TABS: 150.0000 mg | ORAL_TABLET | Freq: Every day | ORAL | 2 refills | Status: DC

## 2016-01-28 NOTE — Progress Notes (Signed)
Name: Kathleen Conley   MRN: [id number]    DOB: [birth year]   Date:01/28/2016       Progress Note  Subjective  Chief Complaint  Chief Complaint  Patient presents with  . Medication Refill    3 month F/U  . Hypertension    Denies any symptoms  . Back Pain    Improving since seeing Chiropractor for 2 months.   . Allergic Rhinitis     Well controlled with medication  . Fibromyalgia    Takes it is better has her good and bad days. Quit taking Lyrica because of horriable nightmares and could not tolerate medcation.  . Depression    Stable    HPI  FMS: she has noticed an improvement on pain level since she started taking Lyrica in the Fall of 2016 however stopped medication a few months ago when she went up to 150 mg she developed horrible nightmares, she wants to stay off medication at this time. She is taking  Cymbalta, Skelaxin and is off Meloxicam now. She states her mental fogginess is okay.   HTN: continue Benicar and bp is at goal, no chest pain, SOB or palpitation.  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 She feels very tired while working 3rd shift , she has noticed an improvement with Nuvigil. No side effects. Able to stay alert at work. She states also sleeping well, and no longer for gasping for air. 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 She denies crying spells or suicidal thoughts.   Metabolic syndrome: she denies polyphagia, polydipsia or polyuria.   Leukocytosis: she missed appointment with Hematologist , she has seen hematologist in the past, she has lymphadenitis suppurativa, we will recheck labs  Low back pain: she is doing well since she had manipulation   Foot problems: seeing Podiatrist, [name], she is in the process of getting  insoles. Had steroid injection with some improvement, she is afraid of having surgery   Patient Active Problem List   Diagnosis Date Noted  . Aortic sclerosis 10/14/2015  . Midline low back pain without sciatica 09/20/2015  . Leukocytosis 11/29/2014  . Osteoarthritis of left knee 11/20/2014  . History of ventral hernia repair 10/27/2014  . Allergic rhinitis 08/17/2014  . Axillary hidradenitis suppurativa 08/17/2014  . Baker cyst 08/17/2014  . Chronic constipation 08/17/2014  . 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 Engages in travel abroad 08/17/2014  . Fibromyalgia 08/17/2014  . Cardiac murmur 08/17/2014  . Anemia, iron deficiency 08/17/2014  . Excessive and frequent menstruation 08/17/2014  . Dysmetabolic syndrome 08/17/2014  . Plantar fasciitis 08/17/2014  . Beat, premature ventricular 08/17/2014  . 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 Obstructive apnea 12/02/2013  . Essential (primary) hypertension 12/09/2009  . Hypertrichosis 12/09/2009  . Extreme obesity (HCC) 12/09/2009    Past Surgical History:  Procedure Laterality Date  . HERNIA REPAIR  10/09/11   Ventral Incarcerated- [name]  . OOPHORECTOMY Left 12/09/2009  . OOPHORECTOMY Left   . TUBAL LIGATION  1993  . VENTRAL HERNIA REPAIR  10/09/2011    Family History  Problem Relation Age of Onset  . Breast cancer Paternal Grandmother   . Heart disease Father   . Hypertension Father   .  Diabetes Father   . Hypertension Mother   . CVA Mother   . Kidney disease Mother   . Congenital heart disease Mother   . Colon cancer Maternal Grandfather   . Colon cancer Maternal Uncle     Social History   Social History  . Marital status: Single    Spouse name: N/A  . Number of children: 3  . Years of education: Associates   Occupational History  . 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 Smoking status: Never Smoker  .  Smokeless tobacco: Never Used  . Alcohol use No  . Drug use: No  . Sexual activity: No   Other Topics Concern  . Not on file   Social History Narrative  . No narrative on file     Current Outpatient Prescriptions:  .  acetaminophen (TYLENOL) 500 MG tablet, Take 1 tablet (500 mg total) by mouth every 6 (six) hours as needed. Max of 3 grams daily or 6 pills dialy, Disp: 30 tablet, Rfl: 0 .  Armodafinil (NUVIGIL) 150 MG tablet, Take 1 tablet (150 mg total) by mouth daily., Disp: 30 tablet, Rfl: 2 .  cetirizine (ZYRTEC) 10 MG tablet, Take 1 tablet by mouth daily., Disp: , Rfl:  .  Cholecalciferol (VITAMIN D) 2000 UNITS tablet, Take 1 tablet by mouth daily., Disp: , Rfl:  .  docusate sodium (COLACE) 100 MG capsule, Take 1 tablet by mouth as needed., Disp: , Rfl:  .  DULoxetine (CYMBALTA) 30 MG capsule, Take 3 capsules (90 mg total) by mouth daily., Disp: 270 capsule, Rfl: 1 .  ferrous sulfate 325 (65 FE) MG tablet, Take 1 tablet by mouth daily., Disp: , Rfl:  .  fluticasone (FLONASE) 50 MCG/ACT nasal spray, Place 2 sprays into the nose daily., Disp: , Rfl:  .  levonorgestrel (MIRENA, 52 MG,) 20 MCG/24HR IUD, 1 each by Intrauterine route., Disp: , Rfl:  .  metaxalone (SKELAXIN) 800 MG tablet, Take 1 tablet (800 mg total) by mouth 3 (three) times daily., Disp: 90 tablet, Rfl: 2 .  olmesartan-hydrochlorothiazide (BENICAR HCT) 20-12.5 MG tablet, Take 1 tablet by mouth daily., Disp: 90 tablet, Rfl: 1  No Known Allergies   ROS  Constitutional: Negative for fever, positive for weight change.  Respiratory: Negative for cough and shortness of breath.   Cardiovascular: Negative for chest pain or palpitations.  Gastrointestinal: Negative for abdominal pain, no bowel changes.  Musculoskeletal: Negative for gait problem or joint swelling.  Skin: Negative for rash.  Neurological: Negative for dizziness or headache.  No other specific complaints in a complete review of systems (except as listed in  HPI above).  Objective  Vitals:   01/28/16 1526  BP: 138/88  Pulse: 95  Resp: 16  Temp: 98.8 F (37.1 C)  TempSrc: Oral  SpO2: 95%  Weight: 238 lb 12.8 oz (108.3 kg)  Height: 5\' 2"  (1.575 m)    Body mass index is 43.68 kg/m.  Physical Exam  Constitutional: Patient appears well-developed and well-nourished. Obese  No distress.  HEENT: head atraumatic, normocephalic, pupils equal and reactive to light,  neck supple, throat within normal limits Cardiovascular: Normal rate, regular rhythm and normal heart sounds.  Positive for SEM. No BLE edema. Pulmonary/Chest: Effort normal and breath sounds normal. No respiratory distress. Abdominal: Soft.  There is no tenderness. Psychiatric: Patient has a normal mood and affect. behavior is normal. Judgment and thought content normal. Muscular Skeletal: trigger point negative  PHQ2/9: [facility] screen [facility] 2/9 01/28/2016 09/20/2015 06/18/2015 03/22/2015 11/20/2014  Decreased Interest 0 0 0 0 0  Down, Depressed, Hopeless 0 0 0 0 0  PHQ - 2 Score 0 0 0 0 0     Fall Risk: Fall Risk  01/28/2016 09/20/2015 06/18/2015 03/22/2015 11/20/2014  Falls in the past year? No No No No No     Functional Status Survey: Is the patient deaf or have difficulty hearing?: No Does the patient have difficulty seeing, even when wearing glasses/contacts?: No Does the patient have difficulty concentrating, remembering, or making decisions?: No Does the patient have difficulty walking or climbing stairs?: No Does the patient have difficulty dressing or bathing?: No Does the patient have difficulty doing errands alone such as visiting a doctor's office or shopping?: No    Assessment & Plan  1. Essential (primary) hypertension  - olmesartan-hydrochlorothiazide (BENICAR HCT) 20-12.5 MG tablet; Take 1 tablet by mouth daily.  Dispense: 90 tablet; Refill: 1 - COMPLETE METABOLIC PANEL WITH GFR  2. Obstructive apnea  - Armodafinil (NUVIGIL) 150 MG tablet; Take 1  tablet (150 mg total) by mouth daily.  Dispense: 30 tablet; Refill: 2 Did not qualify for CPAP sleeping on her side   3. Fibromyalgia  - DULoxetine (CYMBALTA) 30 MG capsule; Take 3 capsules (90 mg total) by mouth daily.  Dispense: 270 capsule; Refill: 1  4. Depression, major, recurrent, mild (HCC)  - DULoxetine (CYMBALTA) 30 MG capsule; Take 3 capsules (90 mg total) by mouth daily.  Dispense: 270 capsule; Refill: 1  5. Shift work sleep disorder  Still working 12 hours shift - Armodafinil (NUVIGIL) 150 MG tablet; Take 1 tablet (150 mg total) by mouth daily.  Dispense: 30 tablet; Refill: 2  6. Midline low back pain without sciatica, unspecified chronicity  Doing better since seen by Dr. Jeannetta EllisBeschel   7. Dysmetabolic syndrome  - Hemoglobin A1c  8. Axillary hidradenitis suppurativa  Recently took antibiotics  9. Leukocytosis, unspecified type  - CBC with Differential/Platelet  10. History of iron deficiency  -CBC  11. Lipid screening  - Lipid panel

## 2016-01-31 ENCOUNTER — Other Ambulatory Visit
Admission: RE | Admit: 2016-01-31 | Discharge: 2016-01-31 | Disposition: A | Discharge status: Home / Self Care | Payer: 59 | Source: Ambulatory Visit | Attending: Family Medicine | Admitting: Family Medicine

## 2016-01-31 DIAGNOSIS — D72829 Elevated white blood cell count, unspecified: Secondary | ICD-10-CM | POA: Diagnosis not present

## 2016-01-31 DIAGNOSIS — Z13220 Encounter for screening for lipoid disorders: Secondary | ICD-10-CM | POA: Insufficient documentation

## 2016-01-31 DIAGNOSIS — E8881 Metabolic syndrome: Secondary | ICD-10-CM | POA: Insufficient documentation

## 2016-01-31 DIAGNOSIS — I10 Essential (primary) hypertension: Secondary | ICD-10-CM | POA: Insufficient documentation

## 2016-01-31 LAB — COMPREHENSIVE METABOLIC PANEL
ALK PHOS: 58 U/L (ref 38–126)
ALT: 15 U/L (ref 14–54)
ANION GAP: 7 (ref 5–15)
AST: 16 U/L (ref 15–41)
Albumin: 3.7 g/dL (ref 3.5–5.0)
BUN: 10 mg/dL (ref 6–20)
CALCIUM: 9 mg/dL (ref 8.9–10.3)
CO2: 27 mmol/L (ref 22–32)
CREATININE: 0.61 mg/dL (ref 0.44–1.00)
Chloride: 104 mmol/L (ref 101–111)
Glucose, Bld: 100 mg/dL — ABNORMAL HIGH (ref 65–99)
Potassium: 3.7 mmol/L (ref 3.5–5.1)
Sodium: 138 mmol/L (ref 135–145)
Total Bilirubin: 0.5 mg/dL (ref 0.3–1.2)
Total Protein: 7.4 g/dL (ref 6.5–8.1)

## 2016-01-31 LAB — CBC WITH DIFFERENTIAL/PLATELET
Basophils Absolute: 0 10*3/uL (ref 0–0.1)
Basophils Relative: 0 %
EOS ABS: 0.3 10*3/uL (ref 0–0.7)
EOS PCT: 3 %
HCT: 37.1 % (ref 35.0–47.0)
HEMOGLOBIN: 12.1 g/dL (ref 12.0–16.0)
LYMPHS ABS: 2.9 10*3/uL (ref 1.0–3.6)
LYMPHS PCT: 25 %
MCH: 26.7 pg (ref 26.0–34.0)
MCHC: 32.7 g/dL (ref 32.0–36.0)
MCV: 81.6 fL (ref 80.0–100.0)
MONOS PCT: 7 %
Monocytes Absolute: 0.8 10*3/uL (ref 0.2–0.9)
Neutro Abs: 7.6 10*3/uL — ABNORMAL HIGH (ref 1.4–6.5)
Neutrophils Relative %: 65 %
PLATELETS: 349 10*3/uL (ref 150–440)
RBC: 4.55 MIL/uL (ref 3.80–5.20)
RDW: 17 % — ABNORMAL HIGH (ref 11.5–14.5)
WBC: 11.7 10*3/uL — AB (ref 3.6–11.0)

## 2016-01-31 LAB — LIPID PANEL
CHOL/HDL RATIO: 3.2 ratio
CHOLESTEROL: 186 mg/dL (ref 0–200)
HDL: 58 mg/dL (ref >40)
LDL CALC: 107 mg/dL — AB (ref 0–99)
TRIGLYCERIDES: 106 mg/dL (ref <150)
VLDL: 21 mg/dL (ref 0–40)

## 2016-02-01 LAB — HEMOGLOBIN A1C
HEMOGLOBIN A1C: 5.7 % — AB (ref 4.8–5.6)
MEAN PLASMA GLUCOSE: 117 mg/dL

## 2016-04-24 ENCOUNTER — Ambulatory Visit: Payer: 59 | Admitting: Family Medicine

## 2016-05-31 ENCOUNTER — Ambulatory Visit (INDEPENDENT_AMBULATORY_CARE_PROVIDER_SITE_OTHER): Payer: 59 | Admitting: Family Medicine

## 2016-05-31 VITALS — BP 132/74 | HR 94 | Temp 97.6°F | Resp 16 | Ht 62.0 in | Wt 241.5 lb

## 2016-05-31 DIAGNOSIS — R5383 Other fatigue: Secondary | ICD-10-CM

## 2016-05-31 DIAGNOSIS — E8881 Metabolic syndrome: Secondary | ICD-10-CM

## 2016-05-31 DIAGNOSIS — Z8639 Personal history of other endocrine, nutritional and metabolic disease: Secondary | ICD-10-CM | POA: Diagnosis not present

## 2016-05-31 DIAGNOSIS — L732 Hidradenitis suppurativa: Secondary | ICD-10-CM

## 2016-05-31 DIAGNOSIS — E669 Obesity, unspecified: Secondary | ICD-10-CM | POA: Diagnosis not present

## 2016-05-31 DIAGNOSIS — G4726 Circadian rhythm sleep disorder, shift work type: Secondary | ICD-10-CM | POA: Diagnosis not present

## 2016-05-31 DIAGNOSIS — G4733 Obstructive sleep apnea (adult) (pediatric): Secondary | ICD-10-CM

## 2016-05-31 DIAGNOSIS — I10 Essential (primary) hypertension: Secondary | ICD-10-CM | POA: Diagnosis not present

## 2016-05-31 DIAGNOSIS — D72829 Elevated white blood cell count, unspecified: Secondary | ICD-10-CM | POA: Diagnosis not present

## 2016-05-31 DIAGNOSIS — M797 Fibromyalgia: Secondary | ICD-10-CM

## 2016-05-31 DIAGNOSIS — M25579 Pain in unspecified ankle and joints of unspecified foot: Secondary | ICD-10-CM | POA: Diagnosis not present

## 2016-05-31 DIAGNOSIS — F330 Major depressive disorder, recurrent, mild: Secondary | ICD-10-CM

## 2016-05-31 LAB — CBC WITH DIFFERENTIAL/PLATELET
BASOS PCT: 0 %
Basophils Absolute: 0 cells/uL (ref 0–200)
EOS PCT: 3 %
Eosinophils Absolute: 357 cells/uL (ref 15–500)
HEMATOCRIT: 39.3 % (ref 35.0–45.0)
HEMOGLOBIN: 12.7 g/dL (ref 11.7–15.5)
LYMPHS ABS: 3451 {cells}/uL (ref 850–3900)
Lymphocytes Relative: 29 %
MCH: 26.8 pg — ABNORMAL LOW (ref 27.0–33.0)
MCHC: 32.3 g/dL (ref 32.0–36.0)
MCV: 82.9 fL (ref 80.0–100.0)
MONO ABS: 952 {cells}/uL — AB (ref 200–950)
MPV: 8.5 fL (ref 7.5–12.5)
Monocytes Relative: 8 %
Neutro Abs: 7140 cells/uL (ref 1500–7800)
Neutrophils Relative %: 60 %
Platelets: 334 10*3/uL (ref 140–400)
RBC: 4.74 MIL/uL (ref 3.80–5.10)
RDW: 15.9 % — AB (ref 11.0–15.0)
WBC: 11.9 10*3/uL — AB (ref 3.8–10.8)

## 2016-05-31 MED ORDER — BUPROPION HCL ER (XL) 150 MG PO TB24: 150.0000 mg | ORAL_TABLET | Freq: Every day | ORAL | 0 refills | Status: DC

## 2016-05-31 MED ORDER — ARMODAFINIL 150 MG PO TABS: 150.0000 mg | ORAL_TABLET | Freq: Every day | ORAL | 2 refills | Status: DC

## 2016-05-31 NOTE — Progress Notes (Signed)
Name: Kathleen Conley   MRN: [id number]    DOB: 01/24/1971   Date:06/01/2016       Progress Note  Subjective  Chief Complaint  Chief Complaint  Patient presents with  . Hypertension    follow up  . Numbness    left foot last 2 toes started when the floors were replaced at work about a week ago  . Depression    has gotten worse past 2 weeks just does not feel like doing anything cymbalta not really helping    HPI  FMS: she has noticed an improvement on pain level since she started taking Lyrica in the Fall of 2016 however stopped medication months ago when she went up to 150 mg she developed horrible nightmares, she wants to stay off medication at this time. She is taking  Cymbalta, Skelaxin and is off Meloxicam now. She states her mental fogginess is getting worse since Depression has gotten worse   HTN: continue Benicar and bp is at goal, no chest pain, SOBor palpitation.  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 She feels very tired while working 3rd shift , she has noticed an improvement with Nuvigil. No side effects. Able to stay alert at work. She states also sleeping well, and no longer for gasping for air. Sleeping on lateral decubitus has also helped  Major Depression moderate : she was doing well on Cymbalta 90 mg daily since Summer 2017 and was  responding well, however over the past few weeks symptoms have been getting worse again, very tired, anhedonia, sleeping all the time, craving comfort foods, she is isolating herself, spending a lot of time in her room. Still able to get up and go to work but otherwise does not want to do anything. She is only showering when necessary but no longer daily, not cooking since Christmas - only eating out. She denies suicidal thoughts or ideation, but would like to feel better  Metabolic syndrome: she denies polyphagia, polydipsia or polyuria.   Leukocytosis: she missed appointment with Hematologist , she has  seen hematologist in the past, she has lymphadenitis suppurativa, we will recheck labs  Low back pain: she is doing well since she had manipulation   Foot problems: seeing Podiatrist, Dr. Ether Griffins, she is in the process of getting insoles. Had steroid injection with some initial  Improvement, however having symptoms on left foot now, she is afraid of having surgery   Patient Active Problem List   Diagnosis Date Noted  . 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 Flat foot 01/28/2016  . Aortic sclerosis 10/14/2015  . Midline low back pain without sciatica 09/20/2015  . Leukocytosis 11/29/2014  . Osteoarthritis of left knee 11/20/2014  . History of ventral hernia repair 10/27/2014  . Allergic rhinitis 08/17/2014  . Axillary hidradenitis suppurativa 08/17/2014  . Baker cyst 08/17/2014  . Chronic constipation 08/17/2014  . 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 Engages in travel abroad 08/17/2014  . Fibromyalgia 08/17/2014  . Cardiac murmur 08/17/2014  . Anemia, iron deficiency 08/17/2014  . Excessive and frequent menstruation 08/17/2014  . Dysmetabolic syndrome 08/17/2014  . Plantar fasciitis 08/17/2014  . Beat, premature ventricular 08/17/2014  . 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 Obstructive apnea 12/02/2013  . Essential (primary) hypertension 12/09/2009  . Hypertrichosis 12/09/2009  . Extreme obesity (HCC) 12/09/2009    Past Surgical History:  Procedure Laterality Date  . HERNIA REPAIR  10/09/11   Ventral Incarcerated- Dr. [name]  . OOPHORECTOMY Left 12/09/2009  . OOPHORECTOMY Left   . TUBAL LIGATION  1993  . VENTRAL HERNIA REPAIR  10/09/2011    Family History  Problem Relation Age of Onset  . Breast cancer Paternal Grandmother   . Heart disease Father   . Hypertension Father   . Diabetes Father   . Hypertension Mother   . CVA Mother   . Kidney disease Mother   . Congenital heart disease Mother    . Colon cancer Maternal Grandfather   . Colon cancer Maternal Uncle     Social History   Social History  . Marital status: Single    Spouse name: N/A  . Number of children: 3  . Years of education: Associates   Occupational History  . 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 Smoking status: Never Smoker  . Smokeless tobacco: Never Used  . Alcohol use No  . Drug use: No  . Sexual activity: No   Other Topics Concern  . Not on file   Social History Narrative  . No narrative on file     Current Outpatient Prescriptions:  .  acetaminophen (TYLENOL) 500 MG tablet, Take 1 tablet (500 mg total) by mouth every 6 (six) hours as needed. Max of 3 grams daily or 6 pills dialy, Disp: 30 tablet, Rfl: 0 .  Armodafinil (NUVIGIL) 150 MG tablet, Take 1 tablet (150 mg total) by mouth daily., Disp: 30 tablet, Rfl: 2 .  aspirin EC 81 MG tablet, Take 1 tablet (81 mg total) by mouth daily., Disp: 30 tablet, Rfl: 0 .  buPROPion (WELLBUTRIN XL) 150 MG 24 hr tablet, Take 1 tablet (150 mg total) by mouth daily., Disp: 90 tablet, Rfl: 0 .  cetirizine (ZYRTEC) 10 MG tablet, Take 1 tablet by mouth daily., Disp: , Rfl:  .  Cholecalciferol (VITAMIN D) 2000 UNITS tablet, Take 1 tablet by mouth daily., Disp: , Rfl:  .  docusate sodium (COLACE) 100 MG capsule, Take 1 tablet by mouth as needed., Disp: , Rfl:  .  DULoxetine (CYMBALTA) 30 MG capsule, Take 3 capsules (90 mg total) by mouth daily., Disp: 270 capsule, Rfl: 1 .  ferrous sulfate 325 (65 FE) MG tablet, Take 1 tablet by mouth daily., Disp: , Rfl:  .  fluticasone (FLONASE) 50 MCG/ACT nasal spray, Place 2 sprays into the nose daily., Disp: , Rfl:  .  levonorgestrel (MIRENA, 52 MG,) 20 MCG/24HR IUD, 1 each by Intrauterine route., Disp: , Rfl:  .  metaxalone (SKELAXIN) 800 MG tablet, Take 1 tablet (800 mg total) by mouth 3 (three) times daily., Disp: 90 tablet, Rfl: 2 .  olmesartan-hydrochlorothiazide (BENICAR HCT) 20-12.5 MG  tablet, Take 1 tablet by mouth daily., Disp: 90 tablet, Rfl: 1  No Known Allergies   ROS  Constitutional: Negative for fever or weight change.  Respiratory: Negative for cough and shortness of breath.   Cardiovascular: Negative for chest pain or palpitations.  Gastrointestinal: Negative for abdominal pain, no bowel changes.  Musculoskeletal: Positive for gait problem but no  joint swelling.  Skin: Negative for rash.  Neurological: Negative for dizziness or headache.  No other specific complaints in a complete review of systems (except as listed in HPI above).  Objective   Vitals:   06/01/16 0845  BP: 132/74  Pulse: 94  Resp: 16  Temp: 97.6 F (36.4 C)  SpO2: 95%  Weight: 241 lb 8 oz (  109.5 kg)  Height:  (1.575 m)    Body mass index is 44.17 kg/m.  Physical Exam  Constitutional: Patient appears well-developed and well-nourished. Obese  No distress.  HEENT: head atraumatic, normocephalic, pupils equal and reactive to light, neck supple, throat within normal limits Cardiovascular: Normal rate, regular rhythm and normal heart sounds.  No murmur heard. No BLE edema. Pulmonary/Chest: Effort normal and breath sounds normal. No respiratory distress. Muscular Skeletal: trigger points positive Abdominal: Soft.  There is no tenderness. Psychiatric: Patient has a normal mood and affect. behavior is normal. Judgment and thought  content normal.  PHQ2/9: Depression screen [facility] 2/9 05/31/2016 01/28/2016 09/20/2015 06/18/2015 03/22/2015  Decreased Interest 1 0 0 0 0  Down, Depressed, Hopeless 1 0 0 0 0  PHQ - 2 Score 2 0 0 0 0  Altered sleeping 1 - - - -  Tired, decreased energy 1 - - - -  Change in appetite 0 - - - -  Feeling bad or failure about yourself  0 - - - -  Trouble concentrating 0 - - - -  Moving slowly or fidgety/restless 0 - - - -  Suicidal thoughts 0 - - - -  PHQ-9 Score 4 - - - -  Difficult doing work/chores Somewhat difficult - - - -     Fall Risk: Fall  Risk  05/31/2016 01/28/2016 09/20/2015 06/18/2015 03/22/2015  Falls in the past year? No No No No No    Assessment & Plan  1. Essential (primary) hypertension  - COMPLETE METABOLIC PANEL WITH GFR  2. Depression, major, recurrent, mild (HCC)  - buPROPion (WELLBUTRIN XL) 150 MG 24 hr tablet; Take 1 tablet (150 mg total) by mouth daily.  Dispense: 90 tablet; Refill: 0  3. Fibromyalgia  Feeling aching all the time  4. Obstructive apnea  - Armodafinil (NUVIGIL) 150 MG tablet; Take 1 tablet (150 mg total) by mouth daily.  Dispense: 30 tablet; Refill: 2  5. Dysmetabolic syndrome  Recheck labs  6. Leukocytosis, unspecified type  - CBC with Differential/Platelet  7. Axillary hidradenitis suppurativa  stable  8. History of iron deficiency  Recheck CBC  9. 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  Dispense: 30 tablet; Refill: 2  11. Sinus tarsi syndrome, unspecified laterality  Likely the cause of numbness of lateral left foot, keep follow up with Dr. Ether Griffins and wearing brace  12. Obesity (BMI 30-39.9)  - Insulin, fasting

## 2016-06-01 ENCOUNTER — Encounter: Payer: Self-pay | Admitting: Family Medicine

## 2016-06-01 LAB — COMPLETE METABOLIC PANEL WITH GFR
ALBUMIN: 4 g/dL (ref 3.6–5.1)
ALT: 13 U/L (ref 6–29)
AST: 13 U/L (ref 10–35)
Alkaline Phosphatase: 74 U/L (ref 33–115)
BUN: 11 mg/dL (ref 7–25)
CALCIUM: 9.4 mg/dL (ref 8.6–10.2)
CHLORIDE: 104 mmol/L (ref 98–110)
CO2: 27 mmol/L (ref 20–31)
CREATININE: 0.63 mg/dL (ref 0.50–1.10)
GFR, Est African American: >89 mL/min (ref >=60)
GFR, Est Non African American: >89 mL/min (ref >=60)
GLUCOSE: 105 mg/dL — AB (ref 65–99)
POTASSIUM: 4.4 mmol/L (ref 3.5–5.3)
SODIUM: 141 mmol/L (ref 135–146)
Total Bilirubin: 0.3 mg/dL (ref 0.2–1.2)
Total Protein: 7.2 g/dL (ref 6.1–8.1)

## 2016-06-01 LAB — TSH: TSH: 3.36 m[IU]/L

## 2016-06-01 LAB — INSULIN, FASTING: INSULIN FASTING, SERUM: 30.4 u[IU]/mL — AB (ref 2.0–19.6)

## 2016-06-01 LAB — VITAMIN B12: Vitamin B-12: 590 pg/mL (ref 200–1100)

## 2016-06-01 LAB — VITAMIN D 25 HYDROXY (VIT D DEFICIENCY, FRACTURES): Vit D, 25-Hydroxy: 38 ng/mL (ref 30–100)

## 2016-06-08 DIAGNOSIS — Q6652 Congenital pes planus, left foot: Secondary | ICD-10-CM | POA: Diagnosis not present

## 2016-06-08 DIAGNOSIS — M216X2 Other acquired deformities of left foot: Secondary | ICD-10-CM | POA: Diagnosis not present

## 2016-06-08 DIAGNOSIS — M76829 Posterior tibial tendinitis, unspecified leg: Secondary | ICD-10-CM | POA: Diagnosis not present

## 2016-06-08 DIAGNOSIS — M25571 Pain in right ankle and joints of right foot: Secondary | ICD-10-CM | POA: Diagnosis not present

## 2016-07-03 ENCOUNTER — Encounter: Payer: Self-pay | Admitting: Family Medicine

## 2016-07-03 ENCOUNTER — Ambulatory Visit (INDEPENDENT_AMBULATORY_CARE_PROVIDER_SITE_OTHER): Payer: 59 | Admitting: Family Medicine

## 2016-07-03 VITALS — BP 122/70 | HR 96 | Temp 98.7°F | Resp 16 | Ht 62.0 in | Wt 238.4 lb

## 2016-07-03 DIAGNOSIS — E8881 Metabolic syndrome: Secondary | ICD-10-CM

## 2016-07-03 DIAGNOSIS — F330 Major depressive disorder, recurrent, mild: Secondary | ICD-10-CM

## 2016-07-03 DIAGNOSIS — E668 Other obesity: Secondary | ICD-10-CM

## 2016-07-03 MED ORDER — LIRAGLUTIDE -WEIGHT MANAGEMENT 18 MG/3ML ~~LOC~~ SOPN: 0.6000 mg | PEN_INJECTOR | Freq: Every day | SUBCUTANEOUS | 1 refills | Status: DC

## 2016-07-03 MED ORDER — DULOXETINE HCL 60 MG PO CPEP: 60.0000 mg | ORAL_CAPSULE | Freq: Every day | ORAL | 0 refills | Status: DC

## 2016-07-03 MED ORDER — INSULIN PEN NEEDLE 30G X 8 MM MISC: 1.0000 | 0 refills | Status: DC | PRN

## 2016-07-03 NOTE — Progress Notes (Signed)
Name: Kathleen Conley   MRN: [id number]    DOB: 15-Jun-1970   Date:07/03/2016       Progress Note  Subjective  Chief Complaint  Chief Complaint  Patient presents with  . Follow-up    1 month F/U  . Depression    Started Wellbutrin last visit and doing better with medication.    HPI  Major Depression moderate : she was doing well on Cymbalta 90 mg daily since Summer 2017 and was  responding well, however since Dec 2017 she noticed worsening of anhedonia, no motivation to even cook ( something she likes), feeling sad and hopeless, she felt like there was no purpose in living. We added Wellbutrin April 2018 and she is feeling much better, more energy , started to cook again, feeling less tired, but third shift and has to sleep during the day. Willing to go out with her family. She however has noticed that she has been chewing on her tongue lately. No other side effects. She is taking Cymbalta 90 and Wellbutrin 150, we will decrease Cymbalta to 60 mg and monitor for now  Metabolic syndrome: she denies polyphagia, polydipsia or polyuria. She has insulin resistance with level above 30, discussed long term risk of DM and she is willing to try medication to help her lose weight.   Obesity: she has a long history of obesity, started after her divorce when she was in her 45's. She tried weight watchers but was unsuccessful. She states at the time she used food to comfort herself. She still does that at times. She has decided to eat better, she is drinking sodas less often and only diet type. Drinking more water, she is not exercising yet.   Patient Active Problem List   Diagnosis Date Noted  . 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 Flat foot 01/28/2016  . Aortic sclerosis 10/14/2015  . Midline low back pain without sciatica 09/20/2015  . Leukocytosis 11/29/2014  . Osteoarthritis of left knee 11/20/2014  . History of ventral hernia repair 10/27/2014  . Allergic rhinitis 08/17/2014  .  Axillary hidradenitis suppurativa 08/17/2014  . Baker cyst 08/17/2014  . Chronic constipation 08/17/2014  . Depression, major, recurrent, mild ([place]) 08/17/2014  . Engages in travel abroad 08/17/2014  . Fibromyalgia 08/17/2014  . Cardiac murmur 08/17/2014  . Anemia, iron deficiency 08/17/2014  . Excessive and frequent menstruation 08/17/2014  . Dysmetabolic syndrome 99/37/1696  . Plantar fasciitis 08/17/2014  . Beat, premature ventricular 08/17/2014  . 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 Obstructive apnea 12/02/2013  . Essential (primary) hypertension 12/09/2009  . Hypertrichosis 12/09/2009  . Extreme obesity 12/09/2009    Past Surgical History:  Procedure Laterality Date  . HERNIA REPAIR  10/09/11   Ventral Incarcerated- Dr. Pat Patrick  . OOPHORECTOMY Left 12/09/2009  . OOPHORECTOMY Left   . TUBAL LIGATION  1993  . VENTRAL HERNIA REPAIR  10/09/2011    Family History  Problem Relation Age of Onset  . Breast cancer Paternal Grandmother   . Heart disease Father   . Hypertension Father   . Diabetes Father   . Hypertension Mother   . CVA Mother   . Kidney disease Mother   . Congenital heart disease Mother   . Colon cancer Maternal Grandfather   . Colon cancer Maternal Uncle     Social History   Social History  . Marital status: Single    Spouse name: N/A  . Number of children: 3  .  Years of education: Associates   Occupational History  . 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 Smoking status: Never Smoker  . Smokeless tobacco: Never Used  . Alcohol use No  . Drug use: No  . Sexual activity: No   Other Topics Concern  . Not on file   Social History Narrative  . No narrative on file     Current Outpatient Prescriptions:  .  acetaminophen (TYLENOL) 500 MG tablet, Take 1 tablet (500 mg total) by mouth every 6 (six) hours as needed. Max of 3 grams daily or 6 pills dialy, Disp: 30 tablet,  Rfl: 0 .  Armodafinil (NUVIGIL) 150 MG tablet, Take 1 tablet (150 mg total) by mouth daily., Disp: 30 tablet, Rfl: 2 .  aspirin EC 81 MG tablet, Take 1 tablet (81 mg total) by mouth daily., Disp: 30 tablet, Rfl: 0 .  buPROPion (WELLBUTRIN XL) 150 MG 24 hr tablet, Take 1 tablet (150 mg total) by mouth daily., Disp: 90 tablet, Rfl: 0 .  cetirizine (ZYRTEC) 10 MG tablet, Take 1 tablet by mouth daily., Disp: , Rfl:  .  Cholecalciferol (VITAMIN D) 2000 UNITS tablet, Take 1 tablet by mouth daily., Disp: , Rfl:  .  docusate sodium (COLACE) 100 MG capsule, Take 1 tablet by mouth as needed., Disp: , Rfl:  .  DULoxetine (CYMBALTA) 30 MG capsule, Take 3 capsules (90 mg total) by mouth daily., Disp: 270 capsule, Rfl: 1 .  ferrous sulfate 325 (65 FE) MG tablet, Take 1 tablet by mouth daily., Disp: , Rfl:  .  fluticasone (FLONASE) 50 MCG/ACT nasal spray, Place 2 sprays into the nose daily., Disp: , Rfl:  .  levonorgestrel (MIRENA, 52 MG,) 20 MCG/24HR IUD, 1 each by Intrauterine route., Disp: , Rfl:  .  metaxalone (SKELAXIN) 800 MG tablet, Take 1 tablet (800 mg total) by mouth 3 (three) times daily., Disp: 90 tablet, Rfl: 2 .  olmesartan-hydrochlorothiazide (BENICAR HCT) 20-12.5 MG tablet, Take 1 tablet by mouth daily., Disp: 90 tablet, Rfl: 1  No Known Allergies   ROS  Constitutional: Negative for fever or significant weight change.  Respiratory: Negative for cough and shortness of breath.   Cardiovascular: Negative for chest pain or palpitations.  Gastrointestinal: Negative for abdominal pain, no bowel changes.  Musculoskeletal: Negative for gait problem or joint swelling.  Skin: Negative for rash.  Neurological: Negative for dizziness or headache.  No other specific complaints in a complete review of systems (except as listed in HPI above).  Objective  Vitals:   07/03/16 1513  BP: 122/70  Pulse: 96  Resp: 16  Temp: 98.7 F (37.1 C)  TempSrc: Oral  SpO2: 94%  Weight: 238 lb 6.4 oz (108.1  kg)  Height: 5' 2" (1.575 m)    Body mass index is 43.6 kg/m.  Physical Exam  Constitutional: Patient appears well-developed and well-nourished. Obese No distress.  HEENT: head atraumatic, normocephalic, pupils equal and reactive to light, strabismus ,  neck supple, throat within normal limits Cardiovascular: Normal rate, regular rhythm and normal heart sounds.  No murmur heard. No BLE edema. Pulmonary/Chest: Effort normal and breath sounds normal. No respiratory distress. Abdominal: Soft.  There is no tenderness. Psychiatric: Patient has a normal mood and affect. behavior is normal. Judgment and thought content normal.  Recent Results (from the past 2160 hour(s))  CBC with Differential/Platelet     Status: Abnormal   Collection Time: 05/31/16  3:46 PM  Result Value  Ref Range   WBC 11.9 (H) 3.8 - 10.8 K/uL   RBC 4.74 3.80 - 5.10 MIL/uL   Hemoglobin 12.7 11.7 - 15.5 g/dL   HCT 39.3 35.0 - 45.0 %   MCV 82.9 80.0 - 100.0 fL   MCH 26.8 (L) 27.0 - 33.0 pg   MCHC 32.3 32.0 - 36.0 g/dL   RDW 15.9 (H) 11.0 - 15.0 %   Platelets 334 140 - 400 K/uL   MPV 8.5 7.5 - 12.5 fL   Neutro Abs 7,140 1,500 - 7,800 cells/uL   Lymphs Abs 3,451 850 - 3,900 cells/uL   Monocytes Absolute 952 (H) 200 - 950 cells/uL   Eosinophils Absolute 357 15 - 500 cells/uL   Basophils Absolute 0 0 - 200 cells/uL   Neutrophils Relative % 60 %   Lymphocytes Relative 29 %   Monocytes Relative 8 %   Eosinophils Relative 3 %   Basophils Relative 0 %   Smear Review Criteria for review not met   COMPLETE METABOLIC PANEL WITH GFR     Status: Abnormal   Collection Time: 05/31/16  3:46 PM  Result Value Ref Range   Sodium 141 135 - 146 mmol/L   Potassium 4.4 3.5 - 5.3 mmol/L   Chloride 104 98 - 110 mmol/L   CO2 27 20 - 31 mmol/L   Glucose, Bld 105 (H) 65 - 99 mg/dL   BUN 11 7 - 25 mg/dL   Creat 0.63 0.50 - 1.10 mg/dL   Total Bilirubin 0.3 0.2 - 1.2 mg/dL   Alkaline Phosphatase 74 33 - 115 U/L   AST 13 10 - 35 U/L    ALT 13 6 - 29 U/L   Total Protein 7.2 6.1 - 8.1 g/dL   Albumin 4.0 3.6 - 5.1 g/dL   Calcium 9.4 8.6 - 10.2 mg/dL   GFR, Est African American >89 >=60 mL/min   GFR, Est Non African American >89 >=60 mL/min  TSH     Status: None   Collection Time: 05/31/16  3:46 PM  Result Value Ref Range   TSH 3.36 mIU/L    Comment:   Reference Range   > or = 20 Years  0.40-4.50   Pregnancy Range First trimester  0.26-2.66 Second trimester 0.55-2.73 Third trimester  0.43-2.91     Vitamin B12     Status: None   Collection Time: 05/31/16  3:46 PM  Result Value Ref Range   Vitamin B-12 590 200 - 1,100 pg/mL  VITAMIN D 25 Hydroxy (Vit-D Deficiency, Fractures)     Status: None   Collection Time: 05/31/16  3:46 PM  Result Value Ref Range   Vit D, 25-Hydroxy 38 30 - 100 ng/mL    Comment: Vitamin D Status           25-OH Vitamin D        Deficiency                <20 ng/mL        Insufficiency         20 - 29 ng/mL        Optimal             > or = 30 ng/mL   For 25-OH Vitamin D testing on patients on D2-supplementation and patients for whom quantitation of D2 and D3 fractions is required, the QuestAssureD 25-OH VIT D, (D2,D3), LC/MS/MS is recommended: order code [id number] (patients > 2 yrs).   Insulin, fasting  Status: Abnormal   Collection Time: 05/31/16  3:46 PM  Result Value Ref Range   Insulin fasting, serum 30.4 (H) 2.0 - 19.6 uIU/mL    Comment:   This insulin assay shows strong cross-reactivity for some insulin analogs (lispro, aspart, and glargine) and much lower cross-reactivity with others (detemir, glulisine).   Stimulated Insulin reference intervals were established using the Siemens Immulite assay. These values are provided for general guidance only.      PHQ2/9: Depression screen [facility] 2/9 07/03/2016 05/31/2016 01/28/2016 09/20/2015 06/18/2015  Decreased Interest 1 1 0 0 0  Down, Depressed, Hopeless 1 1 0 0 0  PHQ - 2 Score 2 2 0 0 0  Altered sleeping 0 1 - - -  Tired,  decreased energy 0 1 - - -  Change in appetite 1 0 - - -  Feeling bad or failure about yourself  1 0 - - -  Trouble concentrating 0 0 - - -  Moving slowly or fidgety/restless 0 0 - - -  Suicidal thoughts 0 0 - - -  PHQ-9 Score 4 4 - - -  Difficult doing work/chores Somewhat difficult Somewhat difficult - - -     Fall Risk: Fall Risk  07/03/2016 05/31/2016 01/28/2016 09/20/2015 06/18/2015  Falls in the past year? _0       Functional Status Survey: Is the patient deaf or have difficulty hearing?: No Does the patient have difficulty seeing, even when wearing glasses/contacts?: No Does the patient have difficulty concentrating, remembering, or making decisions?: No Does the patient have difficulty walking or climbing stairs?: No Does the patient have difficulty dressing or bathing?: No Does the patient have difficulty doing errands alone such as visiting a doctor's office or shopping?: No   Assessment & Plan  1. Depression, major, recurrent, mild ([place])  She has noticed that since added Wellbutrin to Cymbalta 90 mg she has been more energetic and less depressed but seems to be having extra pyramidal effects ( Such as biting her tongue ), chewing without noticing.  We will decrease dose of Duloxetine to 60 mg daily and she will let me know how it works, if symptoms improve but she gets more depressed we will increase Wellbutrin dose to 300 mg  - DULoxetine (CYMBALTA) 60 MG capsule; Take 1 capsule (60 mg total) by mouth daily.  Dispense: 90 capsule; Refill: 0 ( did not print )   2. Extreme obesity  Discussed with the patient the risk posed by an increased BMI. Discussed importance of portion control, calorie counting and at least 150 minutes of physical activity weekly. Avoid sweet beverages and drink more water. Eat at least 6 servings of fruit and vegetables daily  Discussed all optiosns for weight loss medications including Belviq, Qsymia, Saxenda ( GLP-1 agonist )  and  Contrave. Discussed risk and benefits of each of them. She would like to try Saxenda  - Liraglutide -Weight Management (SAXENDA) 18 MG/3ML SOPN; Inject 0.6-3 mg into the skin daily.  Dispense: 9 mL; Refill: 1 - Insulin Pen Needle (NOVOFINE) 30G X 8 MM MISC; Inject 10 each into the skin as needed.  Dispense: 100 each; Refill: 0  3. Dysmetabolic syndrome  High insulin level, discussed Metformin or GLP-1 agonist and she wants to try diet and exercise first Discussed dietician to help her eat healthier.   - Liraglutide -Weight Management (SAXENDA) 18 MG/3ML SOPN; Inject 0.6-3 mg into the skin daily.  Dispense: 9 mL; Refill: 1 - Insulin Pen  Needle (NOVOFINE) 30G X 8 MM MISC; Inject 10 each into the skin as needed.  Dispense: 100 each; Refill: 0

## 2016-08-14 ENCOUNTER — Ambulatory Visit (INDEPENDENT_AMBULATORY_CARE_PROVIDER_SITE_OTHER): Payer: 59 | Admitting: Family Medicine

## 2016-08-14 ENCOUNTER — Encounter: Payer: Self-pay | Admitting: Family Medicine

## 2016-08-14 VITALS — BP 128/64 | HR 106 | Temp 98.5°F | Resp 18 | Wt 236.6 lb

## 2016-08-14 DIAGNOSIS — I10 Essential (primary) hypertension: Secondary | ICD-10-CM | POA: Diagnosis not present

## 2016-08-14 DIAGNOSIS — M797 Fibromyalgia: Secondary | ICD-10-CM

## 2016-08-14 DIAGNOSIS — F330 Major depressive disorder, recurrent, mild: Secondary | ICD-10-CM

## 2016-08-14 DIAGNOSIS — E668 Other obesity: Secondary | ICD-10-CM | POA: Diagnosis not present

## 2016-08-14 DIAGNOSIS — J302 Other seasonal allergic rhinitis: Secondary | ICD-10-CM

## 2016-08-14 MED ORDER — BUPROPION HCL ER (XL) 150 MG PO TB24: 150.0000 mg | ORAL_TABLET | Freq: Every day | ORAL | 0 refills | Status: DC

## 2016-08-14 MED ORDER — FLUTICASONE PROPIONATE 50 MCG/ACT NA SUSP: 2.0000 | Freq: Every day | NASAL | 1 refills | Status: DC

## 2016-08-14 MED ORDER — OLMESARTAN MEDOXOMIL-HCTZ 20-12.5 MG PO TABS: 1.0000 | ORAL_TABLET | Freq: Every day | ORAL | 1 refills | Status: DC

## 2016-08-14 NOTE — Progress Notes (Signed)
Name: Kathleen Conley   MRN: [id number]    DOB: 1970-06-29   Date:08/14/2016       Progress Note  Subjective  Chief Complaint  Chief Complaint  Patient presents with  . Follow-up    6 weeks  . Depression    Last visit decreased dosage due to her chewing on her back tongue, it has improved but still has it.  . Obesity    Was scared to start the Saxenda and never did  . Allergic Reaction    Needs refill of medication    HPI  Major Depression moderate: she was doing well on Cymbalta 90 mg daily since Summer 2017 and was responding well, however since Dec 2017 she noticed worsening of anhedonia, no motivation to even cook ( something she likes), feeling sad and hopeless, she felt like there was no purpose in living. We added Wellbutrin April 2018 and she was feeling much better, more energy , started to cook again, feeling less tired, but had to change to  third shift and has to sleep during the day. She is feeling very tired since we decreased dose of Cymbalta ( she was chewing her tongue), no motivation.  No other side effects. She is taking Cymbalta 69m  Wellbutrin 150. Depression is not as controlled, Discussed importance of seeing psychiatrist and therapist, but she refuses.   Metabolic syndrome: she denies polyphagia, polydipsia or polyuria. She has insulin resistance with level above 30, discussed long term risk of DM and she is willing to try medication to help her lose weight. She was given rx of Saxenda, but afraid to fill it.   Obesity: she has a long history of obesity, started after her divorce when she was in her 89's She tried weight watchers but was unsuccessful. She states at the time she used food to comfort herself. She still does that at times. She has decided to eat better, she is drinking sodas less often and only diet type. Drinking more water, she is not exercising yet, [place]   FMS: she states pain is better with Cymbalta, could not  tolerate Lyrica ( caused nightmares and vivid dreams), pain level on Cymbalta is 4/10. Some days worse than others. Symptoms are worse when she is sedentary for a long time, feels more stiff, she has episodes of mental fogginess.   OSA: she has mild symptoms, not on CPAP, sleeping on her side, she is not waking up with headaches  HTN: well controlled, taking Benicar/hctz and denies side effects, no chest pain but HR goes up while walking, and occasionally has a forceful beat, not associated with chest pain or SOB. Discussed adding beta-blocker but she would like to hold off for now.    Patient Active Problem List   Diagnosis Date Noted  . 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 Flat foot 01/28/2016  . Aortic sclerosis 10/14/2015  . Midline low back pain without sciatica 09/20/2015  . Leukocytosis 11/29/2014  . Osteoarthritis of left knee 11/20/2014  . History of ventral hernia repair 10/27/2014  . Allergic rhinitis 08/17/2014  . Axillary hidradenitis suppurativa 08/17/2014  . Baker cyst 08/17/2014  . Chronic constipation 08/17/2014  . Depression, major, recurrent, mild ([place] 08/17/2014  . Engages in travel abroad 08/17/2014  . Fibromyalgia 08/17/2014  . Cardiac murmur 08/17/2014  . Anemia, iron deficiency 08/17/2014  . Excessive and frequent menstruation 08/17/2014  . Dysmetabolic syndrome 042/[id number] . Plantar fasciitis 08/17/2014  . Beat,  premature ventricular 08/17/2014  . 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 Obstructive apnea 12/02/2013  . Essential (primary) hypertension 12/09/2009  . Hypertrichosis 12/09/2009  . Extreme obesity 12/09/2009    Past Surgical History:  Procedure Laterality Date  . HERNIA REPAIR  10/09/11   Ventral Incarcerated- Dr. Pat Patrick  . OOPHORECTOMY Left 12/09/2009  . OOPHORECTOMY Left   . TUBAL LIGATION  1993  . VENTRAL HERNIA REPAIR  10/09/2011    Family History  Problem Relation Age of  Onset  . Breast cancer Paternal Grandmother   . Heart disease Father   . Hypertension Father   . Diabetes Father   . Hypertension Mother   . CVA Mother   . Kidney disease Mother   . Congenital heart disease Mother   . Colon cancer Maternal Grandfather   . Colon cancer Maternal Uncle     Social History   Social History  . Marital status: Single    Spouse name: N/A  . Number of children: 3  . Years of education: Associates   Occupational History  . LPN at the [employer]  . Smoking status: Never Smoker  . Smokeless tobacco: Never Used  . Alcohol use No  . Drug use: No  . Sexual activity: No   Other Topics Concern  . Not on file   Social History Narrative  . No narrative on file     Current Outpatient Prescriptions:  .  acetaminophen (TYLENOL) 500 MG tablet, Take 1 tablet (500 mg total) by mouth every 6 (six) hours as needed. Max of 3 grams daily or 6 pills dialy, Disp: 30 tablet, Rfl: 0 .  Armodafinil (NUVIGIL) 150 MG tablet, Take 1 tablet (150 mg total) by mouth daily., Disp: 30 tablet, Rfl: 2 .  aspirin EC 81 MG tablet, Take 1 tablet (81 mg total) by mouth daily., Disp: 30 tablet, Rfl: 0 .  buPROPion (WELLBUTRIN XL) 150 MG 24 hr tablet, Take 1 tablet (150 mg total) by mouth daily., Disp: 90 tablet, Rfl: 0 .  cetirizine (ZYRTEC) 10 MG tablet, Take 1 tablet by mouth daily., Disp: , Rfl:  .  Cholecalciferol (VITAMIN D) 2000 UNITS tablet, Take 1 tablet by mouth daily., Disp: , Rfl:  .  docusate sodium (COLACE) 100 MG capsule, Take 1 tablet by mouth as needed., Disp: , Rfl:  .  DULoxetine (CYMBALTA) 60 MG capsule, Take 1 capsule (60 mg total) by mouth daily., Disp: 90 capsule, Rfl: 0 .  ferrous sulfate 325 (65 FE) MG tablet, Take 1 tablet by mouth daily., Disp: , Rfl:  .  fluticasone (FLONASE) 50 MCG/ACT nasal spray, Place 2 sprays into both nostrils daily., Disp: 16 g, Rfl: 1 .  Insulin Pen Needle (NOVOFINE) 30G X 8 MM MISC, Inject  10 each into the skin as needed., Disp: 100 each, Rfl: 0 .  levonorgestrel (MIRENA, 52 MG,) 20 MCG/24HR IUD, 1 each by Intrauterine route., Disp: , Rfl:  .  Liraglutide -Weight Management (SAXENDA) 18 MG/3ML SOPN, Inject 0.6-3 mg into the skin daily., Disp: 9 mL, Rfl: 1 .  metaxalone (SKELAXIN) 800 MG tablet, Take 1 tablet (800 mg total) by mouth 3 (three) times daily., Disp: 90 tablet, Rfl: 2 .  olmesartan-hydrochlorothiazide (BENICAR HCT) 20-12.5 MG tablet, Take 1 tablet by mouth daily., Disp: 90 tablet, Rfl: 1  No Known Allergies   ROS  Constitutional: Negative for fever or weight change.  Respiratory: Negative for cough  and shortness of breath.   Cardiovascular: Negative for chest pain or palpitations.  Gastrointestinal: Negative for abdominal pain, no bowel changes.  Musculoskeletal: Negative for gait problem or joint swelling.  Skin: Negative for rash.  Neurological: Negative for dizziness or headache.  No other specific complaints in a complete review of systems (except as listed in HPI above).  Objective  Vitals:   08/14/16 1544  BP: 128/64  Pulse: (!) 106  Resp: 18  Temp: 98.5 F (36.9 C)  TempSrc: Oral  SpO2: 97%  Weight: 236 lb 9.6 oz (107.3 kg)    Body mass index is 43.27 kg/m.  Physical Exam  Constitutional: Patient appears well-developed and well-nourished. Obese  No distress.  HEENT: head atraumatic, normocephalic, pupils equal and reactive to light, boggy turbinates,neck supple, throat within normal limits Cardiovascular: Normal rate, regular rhythm and normal heart sounds.  No murmur heard. No BLE edema. Pulmonary/Chest: Effort normal and breath sounds normal. No respiratory distress. Abdominal: Soft.  There is no tenderness. Psychiatric: Patient has a normal mood and affect. behavior is normal. Judgment and thought content normal.  Recent Results (from the past 2160 hour(s))  CBC with Differential/Platelet     Status: Abnormal   Collection Time:  05/31/16  3:46 PM  Result Value Ref Range   WBC 11.9 (H) 3.8 - 10.8 K/uL   RBC 4.74 3.80 - 5.10 MIL/uL   Hemoglobin 12.7 11.7 - 15.5 g/dL   HCT 39.3 35.0 - 45.0 %   MCV 82.9 80.0 - 100.0 fL   MCH 26.8 (L) 27.0 - 33.0 pg   MCHC 32.3 32.0 - 36.0 g/dL   RDW 15.9 (H) 11.0 - 15.0 %   Platelets 334 140 - 400 K/uL   MPV 8.5 7.5 - 12.5 fL   Neutro Abs 7,140 1,500 - 7,800 cells/uL   Lymphs Abs 3,451 850 - 3,900 cells/uL   Monocytes Absolute 952 (H) 200 - 950 cells/uL   Eosinophils Absolute 357 15 - 500 cells/uL   Basophils Absolute 0 0 - 200 cells/uL   Neutrophils Relative % 60 %   Lymphocytes Relative 29 %   Monocytes Relative 8 %   Eosinophils Relative 3 %   Basophils Relative 0 %   Smear Review Criteria for review not met   COMPLETE METABOLIC PANEL WITH GFR     Status: Abnormal   Collection Time: 05/31/16  3:46 PM  Result Value Ref Range   Sodium 141 135 - 146 mmol/L   Potassium 4.4 3.5 - 5.3 mmol/L   Chloride 104 98 - 110 mmol/L   CO2 27 20 - 31 mmol/L   Glucose, Bld 105 (H) 65 - 99 mg/dL   BUN 11 7 - 25 mg/dL   Creat 0.63 0.50 - 1.10 mg/dL   Total Bilirubin 0.3 0.2 - 1.2 mg/dL   Alkaline Phosphatase 74 33 - 115 U/L   AST 13 10 - 35 U/L   ALT 13 6 - 29 U/L   Total Protein 7.2 6.1 - 8.1 g/dL   Albumin 4.0 3.6 - 5.1 g/dL   Calcium 9.4 8.6 - 10.2 mg/dL   GFR, Est African American >89 >=60 mL/min   GFR, Est Non African American >89 >=60 mL/min  TSH     Status: None   Collection Time: 05/31/16  3:46 PM  Result Value Ref Range   TSH 3.36 mIU/L    Comment:   Reference Range   > or = 20 Years  0.40-4.50   Pregnancy Range First trimester  0.26-2.66 Second trimester 0.55-2.73 Third trimester  0.43-2.91     Vitamin B12     Status: None   Collection Time: 05/31/16  3:46 PM  Result Value Ref Range   Vitamin B-12 590 200 - 1,100 pg/mL  VITAMIN D 25 Hydroxy (Vit-D Deficiency, Fractures)     Status: None   Collection Time: 05/31/16  3:46 PM  Result Value Ref Range   Vit D,  25-Hydroxy 38 30 - 100 ng/mL    Comment: Vitamin D Status           25-OH Vitamin D        Deficiency                <20 ng/mL        Insufficiency         20 - 29 ng/mL        Optimal             > or = 30 ng/mL   For 25-OH Vitamin D testing on patients on D2-supplementation and patients for whom quantitation of D2 and D3 fractions is required, the QuestAssureD 25-OH VIT D, (D2,D3), LC/MS/MS is recommended: order code [id number] (patients > 2 yrs).   Insulin, fasting     Status: Abnormal   Collection Time: 05/31/16  3:46 PM  Result Value Ref Range   Insulin fasting, serum 30.4 (H) 2.0 - 19.6 uIU/mL    Comment:   This insulin assay shows strong cross-reactivity for some insulin analogs (lispro, aspart, and glargine) and much lower cross-reactivity with others (detemir, glulisine).   Stimulated Insulin reference intervals were established using the Siemens Immulite assay. These values are provided for general guidance only.     PHQ2/9: Depression screen [facility] 2/9 07/03/2016 05/31/2016 01/28/2016 09/20/2015 06/18/2015  Decreased Interest 1 1 0 0 0  Down, Depressed, Hopeless 1 1 0 0 0  PHQ - 2 Score 2 2 0 0 0  Altered sleeping 0 1 - - -  Tired, decreased energy 0 1 - - -  Change in appetite 1 0 - - -  Feeling bad or failure about yourself  1 0 - - -  Trouble concentrating 0 0 - - -  Moving slowly or fidgety/restless 0 0 - - -  Suicidal thoughts 0 0 - - -  PHQ-9 Score 4 4 - - -  Difficult doing work/chores Somewhat difficult Somewhat difficult - - -     Fall Risk: Fall Risk  07/03/2016 05/31/2016 01/28/2016 09/20/2015 06/18/2015  Falls in the past year? No No No No No     Assessment & Plan  1. Depression, major, recurrent, mild ([place])  She is on lower dose of Cymbalta and noticed improvement of tongue chewing, but still has some symptoms. Discussed stopping it , but she is afraid that her pain from [address] will increase, explained that we can leave like it is for now  2. Extreme  obesity  She is afraid of losing weight, worried about not fitting on her clothe and having to buy new clothes. She thinks she is making excuses. Explained that she may not be ready for it.   3. Fibromyalgia  She cannot tolerate Lyrica and afraid to stop Cymbalta.   4. Seasonal allergic rhinitis, unspecified trigger  Resume medication, having more rhinorrhea and nasal congestion lately - fluticasone (FLONASE) 50 MCG/ACT nasal spray; Place 2 sprays into both nostrils daily.  Dispense: 16 g; Refill: 1  5. Essential (primary) hypertension  At goal,  continue Benicar/hctz

## 2016-08-14 NOTE — Patient Instructions (Signed)
Great courses - mindfulness.

## 2016-08-21 ENCOUNTER — Encounter: Payer: Self-pay | Admitting: Family Medicine

## 2016-08-21 ENCOUNTER — Ambulatory Visit (INDEPENDENT_AMBULATORY_CARE_PROVIDER_SITE_OTHER): Payer: 59 | Admitting: Family Medicine

## 2016-08-21 VITALS — BP 124/82 | HR 100 | Temp 98.6°F | Resp 16 | Ht 62.0 in | Wt 232.0 lb

## 2016-08-21 DIAGNOSIS — R059 Cough, unspecified: Secondary | ICD-10-CM

## 2016-08-21 DIAGNOSIS — J029 Acute pharyngitis, unspecified: Secondary | ICD-10-CM

## 2016-08-21 DIAGNOSIS — J9801 Acute bronchospasm: Secondary | ICD-10-CM

## 2016-08-21 DIAGNOSIS — J302 Other seasonal allergic rhinitis: Secondary | ICD-10-CM

## 2016-08-21 DIAGNOSIS — R05 Cough: Secondary | ICD-10-CM | POA: Diagnosis not present

## 2016-08-21 LAB — POCT RAPID STREP A (OFFICE): Rapid Strep A Screen: NEGATIVE

## 2016-08-21 MED ORDER — ALBUTEROL SULFATE HFA 108 (90 BASE) MCG/ACT IN AERS: 2.0000 | INHALATION_SPRAY | Freq: Four times a day (QID) | RESPIRATORY_TRACT | 0 refills | Status: DC | PRN

## 2016-08-21 MED ORDER — LORATADINE 10 MG PO TABS: 10.0000 mg | ORAL_TABLET | Freq: Every day | ORAL | 11 refills | Status: DC

## 2016-08-21 MED ORDER — HYDROCOD POLST-CPM POLST ER 10-8 MG/5ML PO SUER: 5.0000 mL | Freq: Two times a day (BID) | ORAL | 0 refills | Status: DC | PRN

## 2016-08-21 NOTE — Patient Instructions (Addendum)
Cough, Adult A cough helps to clear your throat and lungs. A cough may last only 2-3 weeks (acute), or it may last longer than 8 weeks (chronic). Many different things can cause a cough. A cough may be a sign of an illness or another medical condition. Follow these instructions at home:  Pay attention to any changes in your cough.  Take medicines only as told by your doctor. ? If you were prescribed an antibiotic medicine, take it as told by your doctor. Do not stop taking it even if you start to feel better. ? Talk with your doctor before you try using a cough medicine.  Drink enough fluid to keep your pee (urine) clear or pale yellow.  If the air is dry, use a cold steam vaporizer or humidifier in your home.  Stay away from things that make you cough at work or at home.  If your cough is worse at night, try using extra pillows to raise your head up higher while you sleep.  Do not smoke, and try not to be around smoke. If you need help quitting, ask your doctor.  Do not have caffeine.  Do not drink alcohol.  Rest as needed. Contact a doctor if:  You have new problems (symptoms).  You cough up yellow fluid (pus).  Your cough does not get better after 2-3 weeks, or your cough gets worse.  Medicine does not help your cough and you are not sleeping well.  You have pain that gets worse or pain that is not helped with medicine.  You have a fever.  You are losing weight and you do not know why.  You have night sweats. Get help right away if:  You cough up blood.  You have trouble breathing.  Your heartbeat is very fast. This information is not intended to replace advice given to you by your health care provider. Make sure you discuss any questions you have with your health care provider. Document Released: 10/20/2010 Document Revised: 07/15/2015 Document Reviewed: 04/15/2014 Elsevier Interactive Patient Education  2018 [employer] Upper Respiratory Infection,  Adult Most upper respiratory infections (URIs) are caused by a virus. A URI affects the nose, throat, and upper air passages. The most common type of URI is often called "the common cold." Follow these instructions at home:  Take medicines only as told by your doctor.  Gargle warm saltwater or take cough drops to comfort your throat as told by your doctor.  Use a warm mist humidifier or inhale steam from a shower to increase air moisture. This may make it easier to breathe.  Drink enough fluid to keep your pee (urine) clear or pale yellow.  Eat soups and other clear broths.  Have a healthy diet.  Rest as needed.  Go back to work when your fever is gone or your doctor says it is okay. ? You may need to stay home longer to avoid giving your URI to others. ? You can also wear a face mask and wash your hands often to prevent spread of the virus.  Use your inhaler more if you have asthma.  Do not use any tobacco products, including cigarettes, chewing tobacco, or electronic cigarettes. If you need help quitting, ask your doctor. Contact a doctor if:  You are getting worse, not better.  Your symptoms are not helped by medicine.  You have chills.  You are getting more short of breath.  You have brown or red mucus.  You have yellow or   brown discharge from your nose.  You have pain in your face, especially when you bend forward.  You have a fever.  You have puffy (swollen) neck glands.  You have pain while swallowing.  You have white areas in the back of your throat. Get help right away if:  You have very bad or constant: ? Headache. ? Ear pain. ? Pain in your forehead, behind your eyes, and over your cheekbones (sinus pain). ? Chest pain.  You have long-lasting (chronic) lung disease and any of the following: ? Wheezing. ? Long-lasting cough. ? Coughing up blood. ? A change in your usual mucus.  You have a stiff neck.  You have changes in  your: ? Vision. ? Hearing. ? Thinking. ? Mood. This information is not intended to replace advice given to you by your health care provider. Make sure you discuss any questions you have with your health care provider. Document Released: 07/26/2007 Document Revised: 10/10/2015 Document Reviewed: 05/14/2013 Elsevier Interactive Patient Education  2018 [employer] Inc.  

## 2016-08-21 NOTE — Progress Notes (Addendum)
Name: Kathleen Conley   MRN: [id number]    DOB: Apr 03, 1970   Date:08/21/2016       Progress Note  Subjective  Chief Complaint  Chief Complaint  Patient presents with  . Sore Throat    fever, congested, cough for 3 days    HPI  Pt presents with 3 day history of lowgrade fever, nasal congestion, and cough. She endorses sinus pressure/pain, headache, mild body aches "like I have the flu" No nausea/vomiting/diarrhea, chest pain, shortness of breath, or wheezing, no ear fullness. Has used tessalon in the past and it did not help her at all.    Has also been taking Tylenol Q4-6 hours (discussed proper dosing of Tylenol and only taking <3067m in 24 hours) with some relief of pain and taking Mucinex OTC PRN without relief of cough.  Uses claritin now and flonase both PRN.  Takes Skelaxin PRN - willing to pause while taking Tussionex to avoid SE's of drowsiness.   Patient Active Problem List   Diagnosis Date Noted  . 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 Flat foot 01/28/2016  . Aortic sclerosis 10/14/2015  . Midline low back pain without sciatica 09/20/2015  . Leukocytosis 11/29/2014  . Osteoarthritis of left knee 11/20/2014  . History of ventral hernia repair 10/27/2014  . Allergic rhinitis 08/17/2014  . Axillary hidradenitis suppurativa 08/17/2014  . Baker cyst 08/17/2014  . Chronic constipation 08/17/2014  . [place] major[place] recurrent[place] 08/17/2014  . Engages in travel abroad 08/17/2014  . Fibromyalgia 08/17/2014  . Cardiac murmur 08/17/2014  . Anemia, iron deficiency 08/17/2014  . Excessive and frequent menstruation 08/17/2014  . Dysmetabolic syndrome 085/63/1497 . Plantar fasciitis 08/17/2014  . Beat, premature ventricular 08/17/2014  . 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 Obstructive apnea 12/02/2013  . Essential (primary) hypertension 12/09/2009  . Hypertrichosis 12/09/2009  . Extreme obesity  12/09/2009    Social History  Substance Use Topics  . Smoking status: Never Smoker  . Smokeless tobacco: Never Used  . Alcohol use No     Current Outpatient Prescriptions:  .  acetaminophen (TYLENOL) 500 MG tablet, Take 1 tablet (500 mg total) by mouth every 6 (six) hours as needed. Max of 3 grams daily or 6 pills dialy, Disp: 30 tablet, Rfl: 0 .  Armodafinil (NUVIGIL) 150 MG tablet, Take 1 tablet (150 mg total) by mouth daily., Disp: 30 tablet, Rfl: 2 .  aspirin EC 81 MG tablet, Take 1 tablet (81 mg total) by mouth daily., Disp: 30 tablet, Rfl: 0 .  buPROPion (WELLBUTRIN XL) 150 MG 24 hr tablet, Take 1 tablet (150 mg total) by mouth daily., Disp: 90 tablet, Rfl: 0 .  cetirizine (ZYRTEC) 10 MG tablet, Take 1 tablet by mouth daily., Disp: , Rfl:  .  Cholecalciferol (VITAMIN D) 2000 UNITS tablet, Take 1 tablet by mouth daily., Disp: , Rfl:  .  docusate sodium (COLACE) 100 MG capsule, Take 1 tablet by mouth as needed., Disp: , Rfl:  .  DULoxetine (CYMBALTA) 60 MG capsule, Take 1 capsule (60 mg total) by mouth daily., Disp: 90 capsule, Rfl: 0 .  ferrous sulfate 325 (65 FE) MG tablet, Take 1 tablet by mouth daily., Disp: , Rfl:  .  fluticasone (FLONASE) 50 MCG/ACT nasal spray, Place 2 sprays into both nostrils daily., Disp: 16 g, Rfl: 1 .  Insulin Pen Needle (NOVOFINE) 30G X 8 MM MISC, Inject 10 each into the skin as needed., Disp: 100 each,  Rfl: 0 .  levonorgestrel (MIRENA, 52 MG,) 20 MCG/24HR IUD, 1 each by Intrauterine route., Disp: , Rfl:  .  Liraglutide -Weight Management (SAXENDA) 18 MG/3ML SOPN, Inject 0.6-3 mg into the skin daily., Disp: 9 mL, Rfl: 1 .  metaxalone (SKELAXIN) 800 MG tablet, Take 1 tablet (800 mg total) by mouth 3 (three) times daily., Disp: 90 tablet, Rfl: 2 .  olmesartan-hydrochlorothiazide (BENICAR HCT) 20-12.5 MG tablet, Take 1 tablet by mouth daily., Disp: 90 tablet, Rfl: 1  No Known Allergies  ROS  Ten systems reviewed and is negative except as mentioned in  HPI  Objective  Vitals:   08/21/16 1115  BP: 124/82  Pulse: 100  Resp: 16  Temp: 98.6 F (37 C)  TempSrc: Oral  SpO2: 95%  Weight: 232 lb (105.2 kg)  Height: _0  (1.575 m)    Body mass index is 42.43 kg/m.  Nursing Note and Vital Signs reviewed.  Physical Exam  Constitutional: Patient appears well-developed and well-nourished. Obese No distress.  HEENT: head atraumatic, normocephalic, pupils equal and reactive to light, EOM's intact, TM's without erythema or bulging, mild to moderate maxillary or frontal sinus pain on palpation, neck supple with mild bilateral submandibular lymphadenopathy, oropharynx pink and moist without exudate Cardiovascular: Normal rate, regular rhythm, S1/S2 present.  No murmur or rub heard. No BLE edema. Pulmonary/Chest: Effort normal and breath sounds clear. No respiratory distress or retractions.  Coughing with bronchospasm during exam. Psychiatric: Patient has a normal mood and affect. behavior is normal. Judgment and thought content normal.  Recent Results (from the past 2160 hour(s))  CBC with Differential/Platelet     Status: Abnormal   Collection Time: 05/31/16  3:46 PM  Result Value Ref Range   WBC 11.9 (H) 3.8 - 10.8 K/uL   RBC 4.74 3.80 - 5.10 MIL/uL   Hemoglobin 12.7 11.7 - 15.5 g/dL   HCT 39.3 35.0 - 45.0 %   MCV 82.9 80.0 - 100.0 fL   MCH 26.8 (L) 27.0 - 33.0 pg   MCHC 32.3 32.0 - 36.0 g/dL   RDW 15.9 (H) 11.0 - 15.0 %   Platelets 334 140 - 400 K/uL   MPV 8.5 7.5 - 12.5 fL   Neutro Abs 7,140 1,500 - 7,800 cells/uL   Lymphs Abs 3,451 850 - 3,900 cells/uL   Monocytes Absolute 952 (H) 200 - 950 cells/uL   Eosinophils Absolute 357 15 - 500 cells/uL   Basophils Absolute 0 0 - 200 cells/uL   Neutrophils Relative % 60 %   Lymphocytes Relative 29 %   Monocytes Relative 8 %   Eosinophils Relative 3 %   Basophils Relative 0 %   Smear Review Criteria for review not met   COMPLETE METABOLIC PANEL WITH GFR     Status: Abnormal    Collection Time: 05/31/16  3:46 PM  Result Value Ref Range   Sodium 141 135 - 146 mmol/L   Potassium 4.4 3.5 - 5.3 mmol/L   Chloride 104 98 - 110 mmol/L   CO2 27 20 - 31 mmol/L   Glucose, Bld 105 (H) 65 - 99 mg/dL   BUN 11 7 - 25 mg/dL   Creat 0.63 0.50 - 1.10 mg/dL   Total Bilirubin 0.3 0.2 - 1.2 mg/dL   Alkaline Phosphatase 74 33 - 115 U/L   AST 13 10 - 35 U/L   ALT 13 6 - 29 U/L   Total Protein 7.2 6.1 - 8.1 g/dL   Albumin 4.0 3.6 - 5.1 g/dL  Calcium 9.4 8.6 - 10.2 mg/dL   GFR, Est African American >89 >=60 mL/min   GFR, Est Non African American >89 >=60 mL/min  TSH     Status: None   Collection Time: 05/31/16  3:46 PM  Result Value Ref Range   TSH 3.36 mIU/L    Comment:   Reference Range   > or = 20 Years  0.40-4.50   Pregnancy Range First trimester  0.26-2.66 Second trimester 0.55-2.73 Third trimester  0.43-2.91     Vitamin B12     Status: None   Collection Time: 05/31/16  3:46 PM  Result Value Ref Range   Vitamin B-12 590 200 - 1,100 pg/mL  VITAMIN D 25 Hydroxy (Vit-D Deficiency, Fractures)     Status: None   Collection Time: 05/31/16  3:46 PM  Result Value Ref Range   Vit D, 25-Hydroxy 38 30 - 100 ng/mL    Comment: Vitamin D Status           25-OH Vitamin D        Deficiency                <20 ng/mL        Insufficiency         20 - 29 ng/mL        Optimal             > or = 30 ng/mL   For 25-OH Vitamin D testing on patients on D2-supplementation and patients for whom quantitation of D2 and D3 fractions is required, the QuestAssureD 25-OH VIT D, (D2,D3), LC/MS/MS is recommended: order code [id number] (patients > 2 yrs).   Insulin, fasting     Status: Abnormal   Collection Time: 05/31/16  3:46 PM  Result Value Ref Range   Insulin fasting, serum 30.4 (H) 2.0 - 19.6 uIU/mL    Comment:   This insulin assay shows strong cross-reactivity for some insulin analogs (lispro, aspart, and glargine) and much lower cross-reactivity with others (detemir, glulisine).    Stimulated Insulin reference intervals were established using the Siemens Immulite assay. These values are provided for general guidance only.   POCT rapid strep A     Status: None   Collection Time: 08/21/16 11:18 AM  Result Value Ref Range   Rapid Strep A Screen Negative Negative     Assessment & Plan 1. Cough - chlorpheniramine-HYDROcodone (TUSSIONEX PENNKINETIC ER) 10-8 MG/5ML SUER; Take 5 mLs by mouth every 12 (twelve) hours as needed for cough (for cough or bronchospasm).  Dispense: 115 mL; Refill: 0  2. Sore throat - POCT rapid strep A - Negative   3. Seasonal allergic rhinitis, unspecified trigger - loratadine (CLARITIN) 10 MG tablet; Take 1 tablet (10 mg total) by mouth daily.  Dispense: 30 tablet; Refill: 11  4. Bronchospasm - albuterol (PROVENTIL HFA;VENTOLIN HFA) 108 (90 Base) MCG/ACT inhaler; Inhale 2 puffs into the lungs every 6 (six) hours as needed for wheezing or shortness of breath.  Dispense: 1 Inhaler; Refill: 0 - chlorpheniramine-HYDROcodone (TUSSIONEX PENNKINETIC ER) 10-8 MG/5ML SUER; Take 5 mLs by mouth every 12 (twelve) hours as needed for cough (for cough or bronchospasm).  Dispense: 115 mL; Refill: 0  -Red flags and when to present for emergency care or RTC including fever >101.71F, chest pain, shortness of breath, new/worsening/un-resolving symptoms, eye pain with movements, eye swelling, increased sinus pain reviewed with patient at time of visit. Follow up and care instructions discussed and provided in AVS.  I have reviewed this  encounter including the documentation in this note and/or discussed this patient with the Johney Maine, FNP, NP-C. I am certifying that I agree with the content of this note as supervising physician.  Steele Sizer, MD [facility] 08/28/2016, 7:54 AM

## 2016-09-11 ENCOUNTER — Other Ambulatory Visit: Payer: Self-pay

## 2016-09-11 DIAGNOSIS — F330 Major depressive disorder, recurrent, mild: Secondary | ICD-10-CM

## 2016-09-11 NOTE — Telephone Encounter (Signed)
Patient requesting refill of Cymbalta states she has missed some doses. I called the pharmacy last time she had this prescription filled was January 28, 2016 for Cymbalta 30 mg taking 3 a day. Also would like us to perform a PA on Saxenda.

## 2016-09-13 NOTE — Telephone Encounter (Signed)
Pt called back and stated that she has a cough and that HCTZ caused a cough for her in the past, I explained to pt that it was not on her allergy list so you would have no way of being aware of that. HCTZ has now been added to  Her allergy list and pt would like to be put on another medication

## 2016-09-14 ENCOUNTER — Ambulatory Visit
Admission: RE | Admit: 2016-09-14 | Discharge: 2016-09-14 | Disposition: A | Discharge status: Home / Self Care | Payer: 59 | Source: Ambulatory Visit | Attending: Family Medicine | Admitting: Family Medicine

## 2016-09-14 ENCOUNTER — Ambulatory Visit (INDEPENDENT_AMBULATORY_CARE_PROVIDER_SITE_OTHER): Payer: 59 | Admitting: Family Medicine

## 2016-09-14 ENCOUNTER — Encounter: Payer: Self-pay | Admitting: Family Medicine

## 2016-09-14 VITALS — BP 158/82 | HR 122 | Temp 100.0°F | Resp 18 | Ht 62.0 in | Wt 238.4 lb

## 2016-09-14 DIAGNOSIS — I10 Essential (primary) hypertension: Secondary | ICD-10-CM

## 2016-09-14 DIAGNOSIS — F330 Major depressive disorder, recurrent, mild: Secondary | ICD-10-CM

## 2016-09-14 DIAGNOSIS — R05 Cough: Secondary | ICD-10-CM | POA: Diagnosis not present

## 2016-09-14 DIAGNOSIS — R059 Cough, unspecified: Secondary | ICD-10-CM

## 2016-09-14 MED ORDER — NEBIVOLOL HCL 10 MG PO TABS: 10.0000 mg | ORAL_TABLET | Freq: Every day | ORAL | 0 refills | Status: DC

## 2016-09-14 MED ORDER — DULOXETINE HCL 60 MG PO CPEP: 60.0000 mg | ORAL_CAPSULE | Freq: Every day | ORAL | 0 refills | Status: DC

## 2016-09-14 NOTE — Progress Notes (Signed)
Name: Kathleen Conley   MRN: [id number]    DOB: 03-13-1970   Date:09/14/2016       Progress Note  Subjective  Chief Complaint  Chief Complaint  Patient presents with  . Hypertension    pt has had cough and thinks it is due to the HCTZ in medication and would like to change medication      . Medication Refill    cymbalta     HPI  Cough: she was seen a few weeks ago with URI symptoms, she states she is still cough, dry, waking her up at night, she has episode of wheezing, but no SOB. She states she has a history of recurrent dry cough. She had cough with ace and is concerned that is Benicar HCTZ that is causing symptoms. She stopped taking bp medication two days ago because when she coughs it causes her to have incontinence. No fever, chills or change in appetite. No history of asthma, but has episodes of wheezing in the past. No calves pain. She has noticed some palpitation when having coughing spells. She denies orthopnea or lower extremity edema   Patient Active Problem List   Diagnosis Date Noted  . 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 Flat foot 01/28/2016  . Aortic sclerosis 10/14/2015  . Midline low back pain without sciatica 09/20/2015  . Leukocytosis 11/29/2014  . Osteoarthritis of left knee 11/20/2014  . History of ventral hernia repair 10/27/2014  . Allergic rhinitis 08/17/2014  . Axillary hidradenitis suppurativa 08/17/2014  . Baker cyst 08/17/2014  . Chronic constipation 08/17/2014  . 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 Engages in travel abroad 08/17/2014  . Fibromyalgia 08/17/2014  . Cardiac murmur 08/17/2014  . Anemia, iron deficiency 08/17/2014  . Excessive and frequent menstruation 08/17/2014  . Dysmetabolic syndrome 08/17/2014  . Plantar fasciitis 08/17/2014  . Beat, premature ventricular 08/17/2014  . 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 Obstructive apnea 12/02/2013  .  Essential (primary) hypertension 12/09/2009  . Hypertrichosis 12/09/2009  . Extreme obesity 12/09/2009    Past Surgical History:  Procedure Laterality Date  . HERNIA REPAIR  10/09/11   Ventral Incarcerated- Dr. [name]  . OOPHORECTOMY Left 12/09/2009  . OOPHORECTOMY Left   . TUBAL LIGATION  1993  . VENTRAL HERNIA REPAIR  10/09/2011    Family History  Problem Relation Age of Onset  . Breast cancer Paternal Grandmother   . Heart disease Father   . Hypertension Father   . Diabetes Father   . Hypertension Mother   . CVA Mother   . Kidney disease Mother   . Congenital heart disease Mother   . Colon cancer Maternal Grandfather   . Colon cancer Maternal Uncle     Social History   Social History  . Marital status: Single    Spouse name: N/A  . Number of children: 3  . Years of education: Associates   Occupational History  . 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 Smoking status: Never Smoker  . Smokeless tobacco: Never Used  . Alcohol use No  . Drug use: No  . Sexual activity: No   Other Topics Concern  . Not on file   Social History Narrative  . No narrative on file     Current Outpatient Prescriptions:  .  acetaminophen (TYLENOL) 500 MG tablet, Take 1 tablet (500 mg total) by mouth every 6 (six) hours as needed.  Max of 3 grams daily or 6 pills dialy, Disp: 30 tablet, Rfl: 0 .  albuterol (PROVENTIL HFA;VENTOLIN HFA) 108 (90 Base) MCG/ACT inhaler, Inhale 2 puffs into the lungs every 6 (six) hours as needed for wheezing or shortness of breath., Disp: 1 Inhaler, Rfl: 0 .  Armodafinil (NUVIGIL) 150 MG tablet, Take 1 tablet (150 mg total) by mouth daily., Disp: 30 tablet, Rfl: 2 .  aspirin EC 81 MG tablet, Take 1 tablet (81 mg total) by mouth daily., Disp: 30 tablet, Rfl: 0 .  buPROPion (WELLBUTRIN XL) 150 MG 24 hr tablet, Take 1 tablet (150 mg total) by mouth daily., Disp: 90 tablet, Rfl: 0 .  Cholecalciferol (VITAMIN D) 2000 UNITS tablet, Take 1  tablet by mouth daily., Disp: , Rfl:  .  docusate sodium (COLACE) 100 MG capsule, Take 1 tablet by mouth as needed., Disp: , Rfl:  .  DULoxetine (CYMBALTA) 60 MG capsule, Take 1 capsule (60 mg total) by mouth daily., Disp: 90 capsule, Rfl: 0 .  ferrous sulfate 325 (65 FE) MG tablet, Take 1 tablet by mouth daily., Disp: , Rfl:  .  fluticasone (FLONASE) 50 MCG/ACT nasal spray, Place 2 sprays into both nostrils daily., Disp: 16 g, Rfl: 1 .  Insulin Pen Needle (NOVOFINE) 30G X 8 MM MISC, Inject 10 each into the skin as needed., Disp: 100 each, Rfl: 0 .  levonorgestrel (MIRENA, 52 MG,) 20 MCG/24HR IUD, 1 each by Intrauterine route., Disp: , Rfl:  .  Liraglutide -Weight Management (SAXENDA) 18 MG/3ML SOPN, Inject 0.6-3 mg into the skin daily., Disp: 9 mL, Rfl: 1 .  loratadine (CLARITIN) 10 MG tablet, Take 1 tablet (10 mg total) by mouth daily., Disp: 30 tablet, Rfl: 11 .  metaxalone (SKELAXIN) 800 MG tablet, Take 1 tablet (800 mg total) by mouth 3 (three) times daily., Disp: 90 tablet, Rfl: 2 .  nebivolol (BYSTOLIC) 10 MG tablet, Take 1 tablet (10 mg total) by mouth daily., Disp: 30 tablet, Rfl: 0  Allergies  Allergen Reactions  . Hctz [Hydrochlorothiazide] Cough  . Benicar [Olmesartan]     cough  . Lisinopril     Cough      ROS  Constitutional: Negative for fever or weight change.  Respiratory: Positive for cough but no shortness of breath.   Cardiovascular: Negative for chest pain, but intermittent  palpitations.  Gastrointestinal: Negative for abdominal pain, no bowel changes.  Musculoskeletal: Negative for gait problem or joint swelling.  Skin: Negative for rash.  Neurological: Negative for dizziness or headache.  No other specific complaints in a complete review of systems (except as listed in HPI above).  Objective  Vitals:   09/14/16 1149  BP: (!) 158/82  Pulse: (!) 122  Resp: 18  Temp: 100 F (37.8 C)  SpO2: 94%  Weight: 238 lb 6 oz (108.1 kg)  Height: 5\' 2"  (1.575 m)     Body mass index is 43.6 kg/m.  Physical Exam  Constitutional: Patient appears well-developed and well-nourished. Obese  No distress.  HEENT: head atraumatic, normocephalic, pupils equal and reactive to light, strabismus,  neck supple, throat within normal limits Cardiovascular: Normal rate, regular rhythm and normal heart sounds.  No murmur heard. No BLE edema. Pulmonary/Chest: Effort normal and breath sounds normal. No respiratory distress. Abdominal: Soft.  There is no tenderness. Psychiatric: Patient has a normal mood and affect. behavior is normal. Judgment and thought content normal.  Recent Results (from the past 2160 hour(s))  POCT rapid strep A  Status: None   Collection Time: 08/21/16 11:18 AM  Result Value Ref Range   Rapid Strep A Screen Negative Negative     PHQ2/9: Depression screen [facility] 07/03/2016 05/31/2016 01/28/2016 09/20/2015 06/18/2015  Decreased Interest 1 1 0 0 0  Down, Depressed, Hopeless 1 1 0 0 0  PHQ - 2 Score 2 2 0 0 0  Altered sleeping 0 1 - - -  Tired, decreased energy 0 1 - - -  Change in appetite 1 0 - - -  Feeling bad or failure about yourself  1 0 - - -  Trouble concentrating 0 0 - - -  Moving slowly or fidgety/restless 0 0 - - -  Suicidal thoughts 0 0 - - -  PHQ-9 Score 4 4 - - -  Difficult doing work/chores Somewhat difficult Somewhat difficult - - -     Fall Risk: Fall Risk  07/03/2016 05/31/2016 01/28/2016 09/20/2015 06/18/2015  Falls in the past year? No No No No No      Assessment & Plan  1. Cough  Going on for a few weeks, dry, we will stop ARB but also because wheezing we will check CXR and spirometry  - Spirometry: Pre - normal  - DG Chest 2 View; Future  Explained it may be post-bronchial cough, she does not want to try prednisone po, willing to take a sample of Advair 250/50  2. Essential (primary) hypertension  She is worried about side effects of ARB and HCTZ and wants to change medication, she stopped bp  medications on her own 2 days ago and bp is high today, we will try Bystolic, she wants to avoid Norvasc because of possible edema - nebivolol (BYSTOLIC) 10 MG tablet; Take 1 tablet (10 mg total) by mouth daily.  Dispense: 30 tablet; Refill: 0  3. Depression, major, recurrent, mild (HCC)  - DULoxetine (CYMBALTA) 60 MG capsule; Take 1 capsule (60 mg total) by mouth daily.  Dispense: 90 capsule; Refill: 0  She is doing well, needs a refill

## 2016-09-25 ENCOUNTER — Other Ambulatory Visit: Payer: Self-pay | Admitting: Family Medicine

## 2016-09-25 ENCOUNTER — Telehealth: Payer: Self-pay | Admitting: Family Medicine

## 2016-09-25 MED ORDER — OLMESARTAN MEDOXOMIL 40 MG PO TABS: 40.0000 mg | ORAL_TABLET | Freq: Every day | ORAL | 2 refills | Status: DC

## 2016-09-25 NOTE — Telephone Encounter (Signed)
Changed, but she will need to monitor for dry cough, if it happens we will need to change the class

## 2016-09-25 NOTE — Telephone Encounter (Signed)
Pt was put on Bystolic however it is making her ankle and feet swell and her legs feel heavy. It also makes her feel really tired. Was told that if the Bystolic did not work that she could go back on the [employer]. Pt is [facility] [phone]

## 2016-09-26 NOTE — Telephone Encounter (Signed)
Pt informed

## 2016-10-03 ENCOUNTER — Other Ambulatory Visit: Payer: Self-pay | Admitting: Family Medicine

## 2016-10-03 ENCOUNTER — Telehealth: Payer: Self-pay | Admitting: Family Medicine

## 2016-10-03 MED ORDER — METFORMIN HCL ER 500 MG PO TB24: 500.0000 mg | ORAL_TABLET | Freq: Every day | ORAL | 1 refills | Status: DC

## 2016-10-03 NOTE — Telephone Encounter (Signed)
Called and left message to notify patient of new prescription.

## 2016-10-03 NOTE — Telephone Encounter (Signed)
Pt said that insurance has denied her for the saxenda and told her that she would need to go on a oral medication first to try. Please advise the patient as to what she will be placed on. Learned this from the pharm.

## 2016-10-03 NOTE — Telephone Encounter (Signed)
Sent Metformin 500 mg XR. It can cause indigestion and diarrhea. Hold if having severe diarrhea or dehydration

## 2016-11-06 DIAGNOSIS — M25571 Pain in right ankle and joints of right foot: Secondary | ICD-10-CM | POA: Diagnosis not present

## 2016-11-14 DIAGNOSIS — M216X2 Other acquired deformities of left foot: Secondary | ICD-10-CM | POA: Diagnosis not present

## 2016-11-14 DIAGNOSIS — M79671 Pain in right foot: Secondary | ICD-10-CM | POA: Diagnosis not present

## 2016-11-14 DIAGNOSIS — Q6652 Congenital pes planus, left foot: Secondary | ICD-10-CM | POA: Diagnosis not present

## 2016-11-14 DIAGNOSIS — M76829 Posterior tibial tendinitis, unspecified leg: Secondary | ICD-10-CM | POA: Diagnosis not present

## 2016-11-15 ENCOUNTER — Ambulatory Visit (INDEPENDENT_AMBULATORY_CARE_PROVIDER_SITE_OTHER): Payer: 59 | Admitting: Family Medicine

## 2016-11-15 ENCOUNTER — Encounter: Payer: Self-pay | Admitting: Family Medicine

## 2016-11-15 VITALS — BP 140/90 | HR 95 | Resp 16 | Wt 241.5 lb

## 2016-11-15 DIAGNOSIS — F330 Major depressive disorder, recurrent, mild: Secondary | ICD-10-CM | POA: Diagnosis not present

## 2016-11-15 DIAGNOSIS — M79671 Pain in right foot: Secondary | ICD-10-CM

## 2016-11-15 DIAGNOSIS — Z23 Encounter for immunization: Secondary | ICD-10-CM

## 2016-11-15 DIAGNOSIS — E668 Other obesity: Secondary | ICD-10-CM

## 2016-11-15 DIAGNOSIS — I10 Essential (primary) hypertension: Secondary | ICD-10-CM | POA: Diagnosis not present

## 2016-11-15 DIAGNOSIS — G4733 Obstructive sleep apnea (adult) (pediatric): Secondary | ICD-10-CM

## 2016-11-15 DIAGNOSIS — M1712 Unilateral primary osteoarthritis, left knee: Secondary | ICD-10-CM

## 2016-11-15 DIAGNOSIS — D72829 Elevated white blood cell count, unspecified: Secondary | ICD-10-CM

## 2016-11-15 DIAGNOSIS — G4726 Circadian rhythm sleep disorder, shift work type: Secondary | ICD-10-CM

## 2016-11-15 DIAGNOSIS — E8881 Metabolic syndrome: Secondary | ICD-10-CM | POA: Diagnosis not present

## 2016-11-15 DIAGNOSIS — M797 Fibromyalgia: Secondary | ICD-10-CM

## 2016-11-15 MED ORDER — METAXALONE 800 MG PO TABS: 800.0000 mg | ORAL_TABLET | Freq: Three times a day (TID) | ORAL | 2 refills | Status: DC

## 2016-11-15 MED ORDER — BUPROPION HCL ER (XL) 150 MG PO TB24: 150.0000 mg | ORAL_TABLET | Freq: Every day | ORAL | 0 refills | Status: DC

## 2016-11-15 MED ORDER — DULOXETINE HCL 60 MG PO CPEP: 60.0000 mg | ORAL_CAPSULE | Freq: Every day | ORAL | 0 refills | Status: DC

## 2016-11-15 MED ORDER — LIRAGLUTIDE -WEIGHT MANAGEMENT 18 MG/3ML ~~LOC~~ SOPN: 3.0000 mg | PEN_INJECTOR | Freq: Every day | SUBCUTANEOUS | 2 refills | Status: DC

## 2016-11-15 MED ORDER — ARMODAFINIL 150 MG PO TABS
150.0000 mg | ORAL_TABLET | Freq: Every day | ORAL | 2 refills | Status: DC
Start: 2016-11-15 — End: 2017-02-23

## 2016-11-15 NOTE — Progress Notes (Signed)
Name: Kathleen Conley   MRN: [id number]    DOB: February 18, 1971   Date:11/15/2016       Progress Note  Subjective  Chief Complaint  Chief Complaint  Patient presents with  . Hypertension  . Obesity  . Depression    HPI  Major Depression moderate: she was on  Cymbalta 90 mg daily since Summer 2017 and was responding well, however since Dec 2017 she noticed worsening of anhedonia, no motivation to even cook ( something she likes), feeling sad and hopeless, she felt like there was no purpose in living. We added Wellbutrin April 2018 and she was feeling much better, more energy , started to cook again, feeling less tired. She developed some tardive dyskinesia and we decreased dose of Cymbalta and started to feel tired again, but is doing well now, energy level is back to normal and no anhedonia.  No other side effects. She is taking Cymbalta   Wellbutrin 150.   Metabolic syndrome: she denies polyphagia, polydipsia or polyuria. She has insulin resistance with level above 30, discussed long term risk of DM and she is willing to try medication to help her lose weight. She was given rx of Saxenda, but initially not approved, we gave her metformin but is causing diarrhea, we will try Saxenda again  Obesity: she has a long history of obesity, started after her divorce when she was in her 50's. She tried weight watchers but was unsuccessful. She states at the time she used food to comfort herself. She still does that at times. She has decided to eat better, she is drinking sodas less often, but is drinking fruit juices so advised to try Crystal Light.  Drinking more water, she is not exercising yet, and now has pain on right foot and is not even walking much now  FMS: she states pain is better with Cymbalta, could not tolerate Lyrica ( caused nightmares and vivid dreams), pain level on Cymbalta is 3/10. Some days worse than others. Symptoms are worse when she is sedentary for a long time, feels more  stiff, she has episodes of mental fogginess.   Right foot pain: acute on chronic Constant pain right foot, worse when weight bearing, and walking, seen by Dr. Ether Griffins and is going to have surgery soon  OSA: she has mild symptoms, not on CPAP, sleeping on her side, she is not waking up with headaches  HTN: well controlled, taking Benicar and denies side effects, no  chest pain or SOB. Palpitation resolved   Patient Active Problem List   Diagnosis Date Noted  . 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 Flat foot 01/28/2016  . Aortic sclerosis 10/14/2015  . Midline low back pain without sciatica 09/20/2015  . Leukocytosis 11/29/2014  . Osteoarthritis of left knee 11/20/2014  . History of ventral hernia repair 10/27/2014  . Allergic rhinitis 08/17/2014  . Axillary hidradenitis suppurativa 08/17/2014  . Baker cyst 08/17/2014  . Chronic constipation 08/17/2014  . 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 Engages in travel abroad 08/17/2014  . Fibromyalgia 08/17/2014  . Cardiac murmur 08/17/2014  . Anemia, iron deficiency 08/17/2014  . Excessive and frequent menstruation 08/17/2014  . Dysmetabolic syndrome 08/17/2014  . Plantar fasciitis 08/17/2014  . Beat, premature ventricular 08/17/2014  . 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 Obstructive apnea 12/02/2013  . Essential (primary) hypertension 12/09/2009  . Hypertrichosis 12/09/2009  . Extreme obesity 12/09/2009    Past Surgical History:  Procedure  Laterality Date  . HERNIA REPAIR  10/09/11   Ventral Incarcerated- Dr. [name]  . OOPHORECTOMY Left 12/09/2009  . OOPHORECTOMY Left   . TUBAL LIGATION  1993  . VENTRAL HERNIA REPAIR  10/09/2011    Family History  Problem Relation Age of Onset  . Breast cancer Paternal Grandmother   . Heart disease Father   . Hypertension Father   . Diabetes Father   . Hypertension Mother   . CVA Mother   . Kidney disease  Mother   . Congenital heart disease Mother   . Colon cancer Maternal Grandfather   . Colon cancer Maternal Uncle     Social History   Social History  . Marital status: Single    Spouse name: N/A  . Number of children: 3  . Years of education: Associates   Occupational History  . 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 Smoking status: Never Smoker  . Smokeless tobacco: Never Used  . Alcohol use No  . Drug use: No  . Sexual activity: No   Other Topics Concern  . Not on file   Social History Narrative  . No narrative on file     Current Outpatient Prescriptions:  .  acetaminophen (TYLENOL) 500 MG tablet, Take 1 tablet (500 mg total) by mouth every 6 (six) hours as needed. Max of 3 grams daily or 6 pills dialy, Disp: 30 tablet, Rfl: 0 .  albuterol (PROVENTIL HFA;VENTOLIN HFA) 108 (90 Base) MCG/ACT inhaler, Inhale 2 puffs into the lungs every 6 (six) hours as needed for wheezing or shortness of breath., Disp: 1 Inhaler, Rfl: 0 .  Armodafinil (NUVIGIL) 150 MG tablet, Take 1 tablet (150 mg total) by mouth daily., Disp: 30 tablet, Rfl: 2 .  aspirin EC 81 MG tablet, Take 1 tablet (81 mg total) by mouth daily., Disp: 30 tablet, Rfl: 0 .  buPROPion (WELLBUTRIN XL) 150 MG 24 hr tablet, Take 1 tablet (150 mg total) by mouth daily., Disp: 90 tablet, Rfl: 0 .  Cholecalciferol (VITAMIN D) 2000 UNITS tablet, Take 1 tablet by mouth daily., Disp: , Rfl:  .  docusate sodium (COLACE) 100 MG capsule, Take 1 tablet by mouth as needed., Disp: , Rfl:  .  DULoxetine (CYMBALTA) 60 MG capsule, Take 1 capsule (60 mg total) by mouth daily., Disp: 90 capsule, Rfl: 0 .  ferrous sulfate 325 (65 FE) MG tablet, Take 1 tablet by mouth daily., Disp: , Rfl:  .  fluticasone (FLONASE) 50 MCG/ACT nasal spray, Place 2 sprays into both nostrils daily., Disp: 16 g, Rfl: 1 .  Insulin Pen Needle (NOVOFINE) 30G X 8 MM MISC, Inject 10 each into the skin as needed., Disp: 100 each, Rfl: 0 .   levonorgestrel (MIRENA, 52 MG,) 20 MCG/24HR IUD, 1 each by Intrauterine route., Disp: , Rfl:  .  loratadine (CLARITIN) 10 MG tablet, Take 1 tablet (10 mg total) by mouth daily., Disp: 30 tablet, Rfl: 11 .  metaxalone (SKELAXIN) 800 MG tablet, Take 1 tablet (800 mg total) by mouth 3 (three) times daily., Disp: 90 tablet, Rfl: 2 .  olmesartan (BENICAR) 40 MG tablet, Take 1 tablet (40 mg total) by mouth daily., Disp: 30 tablet, Rfl: 2 .  Liraglutide -Weight Management (SAXENDA) 18 MG/3ML SOPN, Inject 3 mg into the skin daily., Disp: 9 mL, Rfl: 2  Allergies  Allergen Reactions  . Hctz [Hydrochlorothiazide] Cough  . Lisinopril     Cough  ROS  Constitutional: Negative for fever or weight change.  Respiratory: Negative for cough and shortness of breath.   Cardiovascular: Negative for chest pain or palpitations.  Gastrointestinal: Negative for abdominal pain, no bowel changes.  Musculoskeletal: Negative for gait problem or joint swelling.  Skin: Negative for rash.  Neurological: Negative for dizziness or headache.  No other specific complaints in a complete review of systems (except as listed in HPI above).  Objective  Vitals:   11/15/16 1412  BP: 140/90  Pulse: 95  Resp: 16  SpO2: 97%  Weight: 241 lb 8 oz (109.5 kg)    Body mass index is 44.17 kg/m.  Physical Exam  Constitutional: Patient appears well-developed and well-nourished. Obese  No distress.  HEENT: head atraumatic, normocephalic, pupils equal and reactive to light,  neck supple, throat within normal limits Cardiovascular: Normal rate, regular rhythm and normal heart sounds.  No murmur heard. No BLE edema. Pulmonary/Chest: Effort normal and breath sounds normal. No respiratory distress. Abdominal: Soft.  There is no tenderness. Psychiatric: Patient has a normal mood and affect. behavior is normal. Judgment and thought content normal. Muscular Skeletal: she is limping, favoring left side  Recent Results (from  the past 2160 hour(s))  POCT rapid strep A     Status: None   Collection Time: 08/21/16 11:18 AM  Result Value Ref Range   Rapid Strep A Screen Negative Negative      PHQ2/9: Depression screen [facility] 2/9 11/15/2016 07/03/2016 05/31/2016 01/28/2016 09/20/2015  Decreased Interest 0 1 1 0 0  Down, Depressed, Hopeless 0 1 1 0 0  PHQ - 2 Score 0 2 2 0 0  Altered sleeping - 0 1 - -  Tired, decreased energy - 0 1 - -  Change in appetite - 1 0 - -  Feeling bad or failure about yourself  - 1 0 - -  Trouble concentrating - 0 0 - -  Moving slowly or fidgety/restless - 0 0 - -  Suicidal thoughts - 0 0 - -  PHQ-9 Score - 4 4 - -  Difficult doing work/chores - Somewhat difficult Somewhat difficult - -     Fall Risk: Fall Risk  11/15/2016 07/03/2016 05/31/2016 01/28/2016 09/20/2015  Falls in the past year? No No No No No     Assessment & Plan  1. Essential (primary) hypertension  At goal   2. Depression, major, recurrent, mild (HCC)  - DULoxetine (CYMBALTA) 60 MG capsule; Take 1 capsule (60 mg total) by mouth daily.  Dispense: 90 capsule; Refill: 0 - buPROPion (WELLBUTRIN XL) 150 MG 24 hr tablet; Take 1 tablet (150 mg total) by mouth daily.  Dispense: 90 tablet; Refill: 0  3. Flu vaccine need  - Flu Vaccine QUAD 36+ mos IM  4. Dysmetabolic syndrome  - Liraglutide -Weight Management (SAXENDA) 18 MG/3ML SOPN; Inject 3 mg into the skin daily.  Dispense: 9 mL; Refill: 2 She cannot tolerate Metformin , causing diarrhea, gained some weight since last visit we will try PA for Saxenda and if not approved we will send Victoza  5. Obstructive apnea  - Armodafinil (NUVIGIL) 150 MG tablet; Take 1 tablet (150 mg total) by mouth daily.  Dispense: 30 tablet; Refill: 2  6. Leukocytosis, unspecified type  Recheck next visit   7. Extreme obesity  - Liraglutide -Weight Management (SAXENDA) 18 MG/3ML SOPN; Inject 3 mg into the skin daily.  Dispense: 9 mL; Refill: 2  8. Primary osteoarthritis of left  knee  Stable  9.  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  Dispense: 30 tablet; Refill: 2  11. Fibromyalgia  - DULoxetine (CYMBALTA) 60 MG capsule; Take 1 capsule (60 mg total) by mouth daily.  Dispense: 90 capsule; Refill: 0 - metaxalone (SKELAXIN) 800 MG tablet; Take 1 tablet (800 mg total) by mouth 3 (three) times daily.  Dispense: 90 tablet; Refill: 2

## 2016-11-28 ENCOUNTER — Ambulatory Visit (INDEPENDENT_AMBULATORY_CARE_PROVIDER_SITE_OTHER): Payer: 59 | Admitting: Family Medicine

## 2016-11-28 ENCOUNTER — Encounter: Payer: Self-pay | Admitting: Family Medicine

## 2016-11-28 ENCOUNTER — Other Ambulatory Visit: Payer: Self-pay | Admitting: Podiatry

## 2016-11-28 ENCOUNTER — Encounter
Admission: RE | Admit: 2016-11-28 | Discharge: 2016-11-28 | Disposition: A | Discharge status: Home / Self Care | Payer: 59 | Source: Ambulatory Visit | Attending: Podiatry | Admitting: Podiatry

## 2016-11-28 VITALS — BP 138/64 | HR 100 | Temp 97.6°F | Resp 18 | Ht 62.0 in | Wt 245.9 lb

## 2016-11-28 DIAGNOSIS — Z01419 Encounter for gynecological examination (general) (routine) without abnormal findings: Secondary | ICD-10-CM

## 2016-11-28 DIAGNOSIS — Z0000 Encounter for general adult medical examination without abnormal findings: Secondary | ICD-10-CM | POA: Diagnosis not present

## 2016-11-28 DIAGNOSIS — M79671 Pain in right foot: Secondary | ICD-10-CM | POA: Diagnosis not present

## 2016-11-28 DIAGNOSIS — Z01818 Encounter for other preprocedural examination: Secondary | ICD-10-CM | POA: Diagnosis not present

## 2016-11-28 DIAGNOSIS — I10 Essential (primary) hypertension: Secondary | ICD-10-CM | POA: Diagnosis not present

## 2016-11-28 DIAGNOSIS — D72829 Elevated white blood cell count, unspecified: Secondary | ICD-10-CM

## 2016-11-28 DIAGNOSIS — Q666 Other congenital valgus deformities of feet: Secondary | ICD-10-CM | POA: Diagnosis not present

## 2016-11-28 DIAGNOSIS — M1990 Unspecified osteoarthritis, unspecified site: Secondary | ICD-10-CM | POA: Diagnosis not present

## 2016-11-28 DIAGNOSIS — G4733 Obstructive sleep apnea (adult) (pediatric): Secondary | ICD-10-CM | POA: Diagnosis not present

## 2016-11-28 DIAGNOSIS — Z1239 Encounter for other screening for malignant neoplasm of breast: Secondary | ICD-10-CM

## 2016-11-28 DIAGNOSIS — F329 Major depressive disorder, single episode, unspecified: Secondary | ICD-10-CM | POA: Diagnosis not present

## 2016-11-28 DIAGNOSIS — M797 Fibromyalgia: Secondary | ICD-10-CM | POA: Diagnosis not present

## 2016-11-28 DIAGNOSIS — Z124 Encounter for screening for malignant neoplasm of cervix: Secondary | ICD-10-CM

## 2016-11-28 DIAGNOSIS — E8881 Metabolic syndrome: Secondary | ICD-10-CM

## 2016-11-28 DIAGNOSIS — G8929 Other chronic pain: Secondary | ICD-10-CM | POA: Diagnosis not present

## 2016-11-28 DIAGNOSIS — E669 Obesity, unspecified: Secondary | ICD-10-CM | POA: Diagnosis not present

## 2016-11-28 DIAGNOSIS — Z888 Allergy status to other drugs, medicaments and biological substances status: Secondary | ICD-10-CM | POA: Diagnosis not present

## 2016-11-28 DIAGNOSIS — M76829 Posterior tibial tendinitis, unspecified leg: Secondary | ICD-10-CM | POA: Diagnosis not present

## 2016-11-28 DIAGNOSIS — M216X2 Other acquired deformities of left foot: Secondary | ICD-10-CM | POA: Diagnosis not present
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
## 2016-11-28 NOTE — Addendum Note (Signed)
Addended by: Cynda Familia on: 11/28/2016 10:17 AM   Modules accepted: Orders

## 2016-11-28 NOTE — Patient Instructions (Signed)
Your procedure is scheduled on: Friday 12/01/16 Report to Day Surgery. To find out your arrival time please call [phone] between 1PM - 3PM on Thurs. 11/30/16  Remember: Instructions that are not followed completely may result in serious medical risk, up to and including death, or upon the discretion of your surgeon and anesthesiologist your surgery may need to be rescheduled.     _X__ 1. Do not eat food after midnight the night before your procedure.                 No gum chewing or hard candies. You may drink clear liquids up to 2 hours                 before you are scheduled to arrive for your surgery- DO not drink clear                 liquids within 2 hours of the start of your surgery.                 Clear Liquids include:  water, apple juice without pulp, clear carbohydrate                 drink such as Clearfast of [place]art[place]ade, Black Coffee or Tea (Do not add                 anything to coffee or tea).     _X__ 2.  No Alcohol for 24 hours before or after surgery.   __ 3.  Do Not Smoke or use e-cigarettes For 24 Hours Prior to Your Surgery.                 Do not use any chewable tobacco products for at least 6 hours prior to                 surgery.  ____  4.  Bring all medications with you on the day of surgery if instructed.   ____  5.  Notify your doctor if there is any change in your medical condition      (cold, fever, infections).     Do not wear jewelry, make-up, hairpins, clips or nail polish. Do not wear lotions, powders, or perfumes. You may wear deodorant. Do not shave 48 hours prior to surgery. Men may shave face and neck. Do not bring valuables to the hospital.    [facility] is not responsible for any belongings or valuables.  Contacts, dentures or bridgework may not be worn into surgery. Leave your suitcase in the car. After surgery it may be brought to your room. For patients admitted to the hospital, discharge time is determined  by your treatment team.   Patients discharged the day of surgery will not be allowed to drive home.   Please read over the following fact sheets that you were given:    _x___ Take these medicines the morning of surgery with A SIP OF WATER:    1.acetaminophen (TYLENOL) 500 MG tablet if needed  2. albuterol (PROVENTIL HFA;VENTOLIN HFA) 108 (90 Base) MCG/ACT inhaler and bring with you  3. buPROPion (WELLBUTRIN XL) 150 MG 24 hr tablet  4.DULoxetine (CYMBALTA) 60 MG capsule  5.olmesartan (BENICAR) 40 MG tablet  6.  ____ Fleet Enema (as directed)   __x__ Use CHG Soap as directed  __x__ Use inhalers on the day of surgery  ____ Stop metformin 2 days prior to surgery    ____ Take 1/2 of usual  insulin dose the night before surgery. No insulin the morning          of surgery.   __x__ Stop aspirin stopped on 11/24/16   _x___ Stop Anti-inflammatories ibuprofen (ADVIL,MOTRIN) 200 MG tablet today    ____ Stop supplements until after surgery.    ____ Bring C-Pap to the hospital.

## 2016-11-28 NOTE — Patient Instructions (Signed)
Preventive Care 40-64 Years, Female Preventive care refers to lifestyle choices and visits with your health care provider that can promote health and wellness. What does preventive care include?  A yearly physical exam. This is also called an annual well check.  Dental exams once or twice a year.  Routine eye exams. Ask your health care provider how often you should have your eyes checked.  Personal lifestyle choices, including: ? Daily care of your teeth and gums. ? Regular physical activity. ? Eating a healthy diet. ? Avoiding tobacco and drug use. ? Limiting alcohol use. ? Practicing safe sex. ? Taking low-dose aspirin daily starting at age 58. ? Taking vitamin and mineral supplements as recommended by your health care provider. What happens during an annual well check? The services and screenings done by your health care provider during your annual well check will depend on your age, overall health, lifestyle risk factors, and family history of disease. Counseling Your health care provider may ask you questions about your:  Alcohol use.  Tobacco use.  Drug use.  Emotional well-being.  Home and relationship well-being.  Sexual activity.  Eating habits.  Work and work Statistician.  Method of birth control.  Menstrual cycle.  Pregnancy history.  Screening You may have the following tests or measurements:  Height, weight, and BMI.  Blood pressure.  Lipid and cholesterol levels. These may be checked every 5 years, or more frequently if you are over 81 years old.  Skin check.  Lung cancer screening. You may have this screening every year starting at age 78 if you have a 30-pack-year history of smoking and currently smoke or have quit within the past 15 years.  Fecal occult blood test (FOBT) of the stool. You may have this test every year starting at age 65.  Flexible sigmoidoscopy or colonoscopy. You may have a sigmoidoscopy every 5 years or a colonoscopy  every 10 years starting at age 30.  Hepatitis C blood test.  Hepatitis B blood test.  Sexually transmitted disease (STD) testing.  Diabetes screening. This is done by checking your blood sugar (glucose) after you have not eaten for a while (fasting). You may have this done every 1-3 years.  Mammogram. This may be done every 1-2 years. Talk to your health care provider about when you should start having regular mammograms. This may depend on whether you have a family history of breast cancer.  BRCA-related cancer screening. This may be done if you have a family history of breast, ovarian, tubal, or peritoneal cancers.  Pelvic exam and Pap test. This may be done every 3 years starting at age 80. Starting at age 36, this may be done every 5 years if you have a Pap test in combination with an HPV test.  Bone density scan. This is done to screen for osteoporosis. You may have this scan if you are at high risk for osteoporosis.  Discuss your test results, treatment options, and if necessary, the need for more tests with your health care provider. Vaccines Your health care provider may recommend certain vaccines, such as:  Influenza vaccine. This is recommended every year.  Tetanus, diphtheria, and acellular pertussis (Tdap, Td) vaccine. You may need a Td booster every 10 years.  Varicella vaccine. You may need this if you have not been vaccinated.  Zoster vaccine. You may need this after age 5.  Measles, mumps, and rubella (MMR) vaccine. You may need at least one dose of MMR if you were born in  1957 or later. You may also need a second dose.  Pneumococcal 13-valent conjugate (PCV13) vaccine. You may need this if you have certain conditions and were not previously vaccinated.  Pneumococcal polysaccharide (PPSV23) vaccine. You may need one or two doses if you smoke cigarettes or if you have certain conditions.  Meningococcal vaccine. You may need this if you have certain  conditions.  Hepatitis A vaccine. You may need this if you have certain conditions or if you travel or work in places where you may be exposed to hepatitis A.  Hepatitis B vaccine. You may need this if you have certain conditions or if you travel or work in places where you may be exposed to hepatitis B.  Haemophilus influenzae type b (Hib) vaccine. You may need this if you have certain conditions.  Talk to your health care provider about which screenings and vaccines you need and how often you need them. This information is not intended to replace advice given to you by your health care provider. Make sure you discuss any questions you have with your health care provider. Document Released: 03/05/2015 Document Revised: 10/27/2015 Document Reviewed: 12/08/2014 Elsevier Interactive Patient Education  2017 [employer].

## 2016-11-29 DIAGNOSIS — E8881 Metabolic syndrome: Secondary | ICD-10-CM | POA: Diagnosis not present

## 2016-11-29 DIAGNOSIS — Z01419 Encounter for gynecological examination (general) (routine) without abnormal findings: Secondary | ICD-10-CM | POA: Diagnosis not present

## 2016-11-29 DIAGNOSIS — Z124 Encounter for screening for malignant neoplasm of cervix: Secondary | ICD-10-CM | POA: Diagnosis not present

## 2016-11-30 DIAGNOSIS — M216X2 Other acquired deformities of left foot: Secondary | ICD-10-CM | POA: Diagnosis not present

## 2016-11-30 DIAGNOSIS — M76829 Posterior tibial tendinitis, unspecified leg: Secondary | ICD-10-CM | POA: Diagnosis not present

## 2016-11-30 LAB — PAP IG AND HPV HIGH-RISK: HPV DNA HIGH RISK: NOT DETECTED

## 2016-11-30 LAB — CBC WITH DIFFERENTIAL/PLATELET
BASOS ABS: 45 {cells}/uL (ref 0–200)
Basophils Relative: 0.4 %
EOS ABS: 328 {cells}/uL (ref 15–500)
EOS PCT: 2.9 %
HCT: 36.7 % (ref 35.0–45.0)
HEMOGLOBIN: 12 g/dL (ref 11.7–15.5)
Lymphs Abs: 3209 cells/uL (ref 850–3900)
MCH: 26.7 pg — AB (ref 27.0–33.0)
MCHC: 32.7 g/dL (ref 32.0–36.0)
MCV: 81.6 fL (ref 80.0–100.0)
MPV: 8.8 fL (ref 7.5–12.5)
Monocytes Relative: 6.4 %
NEUTROS ABS: 6995 {cells}/uL (ref 1500–7800)
Neutrophils Relative %: 61.9 %
PLATELETS: 399 10*3/uL (ref 140–400)
RBC: 4.5 10*6/uL (ref 3.80–5.10)
RDW: 15.4 % — AB (ref 11.0–15.0)
TOTAL LYMPHOCYTE: 28.4 %
WBC mixed population: 723 cells/uL (ref 200–950)
WBC: 11.3 10*3/uL — ABNORMAL HIGH (ref 3.8–10.8)

## 2016-11-30 LAB — HEMOGLOBIN A1C
EAG (MMOL/L): 6.5 (calc)
HEMOGLOBIN A1C: 5.7 %{Hb} — AB (ref <5.7)
MEAN PLASMA GLUCOSE: 117 (calc)

## 2016-11-30 LAB — COMPLETE METABOLIC PANEL WITH GFR
AG Ratio: 1.4 (calc) (ref 1.0–2.5)
ALKALINE PHOSPHATASE (APISO): 76 U/L (ref 33–115)
ALT: 12 U/L (ref 6–29)
AST: 12 U/L (ref 10–35)
Albumin: 4.1 g/dL (ref 3.6–5.1)
BILIRUBIN TOTAL: 0.3 mg/dL (ref 0.2–1.2)
BUN: 12 mg/dL (ref 7–25)
CHLORIDE: 106 mmol/L (ref 98–110)
CO2: 31 mmol/L (ref 20–32)
CREATININE: 0.6 mg/dL (ref 0.50–1.10)
Calcium: 9.2 mg/dL (ref 8.6–10.2)
GFR, Est African American: 126 mL/min/{1.73_m2} (ref >=60)
GFR, Est Non African American: 109 mL/min/{1.73_m2} (ref >=60)
GLUCOSE: 83 mg/dL (ref 65–99)
Globulin: 3 g/dL (calc) (ref 1.9–3.7)
Potassium: 4.3 mmol/L (ref 3.5–5.3)
Sodium: 142 mmol/L (ref 135–146)
Total Protein: 7.1 g/dL (ref 6.1–8.1)

## 2016-11-30 LAB — LIPID PANEL
CHOL/HDL RATIO: 4 (calc) (ref <5.0)
CHOLESTEROL: 220 mg/dL — AB (ref <200)
HDL: 55 mg/dL (ref >50)
LDL CHOLESTEROL (CALC): 138 mg/dL — AB
NON-HDL CHOLESTEROL (CALC): 165 mg/dL — AB (ref <130)
Triglycerides: 138 mg/dL (ref <150)

## 2016-11-30 LAB — INSULIN, RANDOM: INSULIN: 28.7 u[IU]/mL — AB (ref 2.0–19.6)

## 2016-12-01 ENCOUNTER — Ambulatory Visit: Payer: 59

## 2016-12-01 ENCOUNTER — Encounter: Payer: Self-pay | Admitting: Anesthesiology

## 2016-12-01 ENCOUNTER — Encounter
Admission: RE | Disposition: A | Discharge status: Home / Self Care | Payer: Self-pay | Source: Ambulatory Visit | Attending: Podiatry

## 2016-12-01 ENCOUNTER — Ambulatory Visit: Payer: 59 | Admitting: Anesthesiology

## 2016-12-01 ENCOUNTER — Observation Stay
Admission: RE | Admit: 2016-12-01 | Discharge: 2016-12-03 | Disposition: A | Discharge status: Home / Self Care | Payer: 59 | Source: Ambulatory Visit | Attending: Podiatry | Admitting: Podiatry

## 2016-12-01 DIAGNOSIS — M1990 Unspecified osteoarthritis, unspecified site: Secondary | ICD-10-CM | POA: Insufficient documentation

## 2016-12-01 DIAGNOSIS — M7989 Other specified soft tissue disorders: Secondary | ICD-10-CM | POA: Diagnosis not present

## 2016-12-01 DIAGNOSIS — Z888 Allergy status to other drugs, medicaments and biological substances status: Secondary | ICD-10-CM | POA: Insufficient documentation

## 2016-12-01 DIAGNOSIS — Z419 Encounter for procedure for purposes other than remedying health state, unspecified: Secondary | ICD-10-CM

## 2016-12-01 DIAGNOSIS — M67971 Unspecified disorder of synovium and tendon, right ankle and foot: Secondary | ICD-10-CM | POA: Diagnosis not present

## 2016-12-01 DIAGNOSIS — G4733 Obstructive sleep apnea (adult) (pediatric): Secondary | ICD-10-CM | POA: Diagnosis not present

## 2016-12-01 DIAGNOSIS — M216X1 Other acquired deformities of right foot: Secondary | ICD-10-CM | POA: Diagnosis not present

## 2016-12-01 DIAGNOSIS — F329 Major depressive disorder, single episode, unspecified: Secondary | ICD-10-CM | POA: Insufficient documentation

## 2016-12-01 DIAGNOSIS — E669 Obesity, unspecified: Secondary | ICD-10-CM | POA: Diagnosis not present

## 2016-12-01 DIAGNOSIS — I10 Essential (primary) hypertension: Secondary | ICD-10-CM | POA: Diagnosis not present

## 2016-12-01 DIAGNOSIS — G4730 Sleep apnea, unspecified: Secondary | ICD-10-CM | POA: Diagnosis not present

## 2016-12-01 DIAGNOSIS — M2141 Flat foot [pes planus] (acquired), right foot: Secondary | ICD-10-CM | POA: Diagnosis not present

## 2016-12-01 DIAGNOSIS — G8918 Other acute postprocedural pain: Secondary | ICD-10-CM | POA: Diagnosis present

## 2016-12-01 DIAGNOSIS — M797 Fibromyalgia: Secondary | ICD-10-CM | POA: Insufficient documentation

## 2016-12-01 DIAGNOSIS — M79671 Pain in right foot: Secondary | ICD-10-CM | POA: Diagnosis not present

## 2016-12-01 DIAGNOSIS — Q666 Other congenital valgus deformities of feet: Secondary | ICD-10-CM | POA: Diagnosis not present

## 2016-12-01 DIAGNOSIS — M25571 Pain in right ankle and joints of right foot: Secondary | ICD-10-CM | POA: Diagnosis not present

## 2016-12-01 DIAGNOSIS — M76821 Posterior tibial tendinitis, right leg: Secondary | ICD-10-CM | POA: Diagnosis not present

## 2016-12-01 HISTORY — PX: FOOT ARTHRODESIS: SHX1655

## 2016-12-01 LAB — CBC
HCT: 35.2 % (ref 35.0–47.0)
Hemoglobin: 11.3 g/dL — ABNORMAL LOW (ref 12.0–16.0)
MCH: 26.8 pg (ref 26.0–34.0)
MCHC: 32.1 g/dL (ref 32.0–36.0)
MCV: 83.5 fL (ref 80.0–100.0)
Platelets: 382 10*3/uL (ref 150–440)
RBC: 4.21 MIL/uL (ref 3.80–5.20)
RDW: 16.8 % — ABNORMAL HIGH (ref 11.5–14.5)
WBC: 14.1 10*3/uL — ABNORMAL HIGH (ref 3.6–11.0)

## 2016-12-01 LAB — CREATININE, SERUM
Creatinine, Ser: 0.6 mg/dL (ref 0.44–1.00)
GFR calc Af Amer: >60 mL/min (ref >60)
GFR calc non Af Amer: >60 mL/min (ref >60)

## 2016-12-01 SURGERY — LENGTHENING, TENDON
Anesthesia: General | Site: Leg Lower | Laterality: Right | Wound class: Clean

## 2016-12-01 MED ORDER — VITAMIN D 1000 UNITS PO TABS
2000.0000 [IU] | ORAL_TABLET | Freq: Every day | ORAL | Status: DC
  Administered 2016-12-02 – 2016-12-03 (×2): 2000 [IU] via ORAL
  Filled 2016-12-01 (×2): qty 2

## 2016-12-01 MED ORDER — ROCURONIUM BROMIDE 100 MG/10ML IV SOLN
INTRAVENOUS | Status: DC | PRN
  Administered 2016-12-01 (×4): 10 mg via INTRAVENOUS
  Administered 2016-12-01: 40 mg via INTRAVENOUS

## 2016-12-01 MED ORDER — MODAFINIL 100 MG PO TABS
100.0000 mg | ORAL_TABLET | Freq: Every day | ORAL | Status: DC
  Administered 2016-12-02 – 2016-12-03 (×2): 100 mg via ORAL
  Filled 2016-12-01 (×2): qty 1

## 2016-12-01 MED ORDER — FENTANYL CITRATE (PF) 100 MCG/2ML IJ SOLN: 25.0000 ug | INTRAMUSCULAR | Status: DC | PRN

## 2016-12-01 MED ORDER — BUPIVACAINE HCL (PF) 0.25 % IJ SOLN
INTRAMUSCULAR | Status: DC | PRN
  Administered 2016-12-01: 20 mL

## 2016-12-01 MED ORDER — PROPOFOL 10 MG/ML IV BOLUS
INTRAVENOUS | Status: AC
  Filled 2016-12-01: qty 20

## 2016-12-01 MED ORDER — DULOXETINE HCL 60 MG PO CPEP
60.0000 mg | ORAL_CAPSULE | Freq: Every day | ORAL | Status: DC
  Filled 2016-12-01 (×2): qty 1

## 2016-12-01 MED ORDER — MIDAZOLAM HCL 2 MG/2ML IJ SOLN
INTRAMUSCULAR | Status: DC | PRN
  Administered 2016-12-01: 2 mg via INTRAVENOUS

## 2016-12-01 MED ORDER — ENOXAPARIN SODIUM 40 MG/0.4ML ~~LOC~~ SOLN
40.0000 mg | Freq: Two times a day (BID) | SUBCUTANEOUS | Status: DC
Start: 2016-12-01 — End: 2016-12-03
  Administered 2016-12-01 – 2016-12-03 (×4): 40 mg via SUBCUTANEOUS
  Filled 2016-12-01 (×4): qty 0.4

## 2016-12-01 MED ORDER — FAMOTIDINE 20 MG PO TABS
20.0000 mg | ORAL_TABLET | Freq: Once | ORAL | Status: AC
  Administered 2016-12-01: 20 mg via ORAL

## 2016-12-01 MED ORDER — LEVONORGESTREL 20 MCG/24HR IU IUD: 1.0000 | INTRAUTERINE_SYSTEM | Freq: Once | INTRAUTERINE | Status: DC

## 2016-12-01 MED ORDER — LIDOCAINE-EPINEPHRINE (PF) 1 %-1:200000 IJ SOLN
INTRAMUSCULAR | Status: AC
  Filled 2016-12-01: qty 30

## 2016-12-01 MED ORDER — BUPIVACAINE LIPOSOME 1.3 % IJ SUSP
INTRAMUSCULAR | Status: DC | PRN
  Administered 2016-12-01: 20 mL

## 2016-12-01 MED ORDER — ROPIVACAINE HCL 5 MG/ML IJ SOLN
INTRAMUSCULAR | Status: AC
  Filled 2016-12-01: qty 30

## 2016-12-01 MED ORDER — FENTANYL CITRATE (PF) 100 MCG/2ML IJ SOLN
25.0000 ug | INTRAMUSCULAR | Status: DC | PRN
  Administered 2016-12-01 (×3): 25 ug via INTRAVENOUS

## 2016-12-01 MED ORDER — ASPIRIN EC 81 MG PO TBEC
81.0000 mg | DELAYED_RELEASE_TABLET | Freq: Every day | ORAL | Status: DC
  Administered 2016-12-03: 81 mg via ORAL
  Filled 2016-12-01 (×2): qty 1

## 2016-12-01 MED ORDER — SUCCINYLCHOLINE CHLORIDE 20 MG/ML IJ SOLN
INTRAMUSCULAR | Status: DC | PRN
  Administered 2016-12-01: 100 mg via INTRAVENOUS

## 2016-12-01 MED ORDER — MIDAZOLAM HCL 2 MG/2ML IJ SOLN
1.0000 mg | Freq: Once | INTRAMUSCULAR | Status: AC
  Administered 2016-12-01: 1 mg via INTRAVENOUS

## 2016-12-01 MED ORDER — BUPIVACAINE HCL (PF) 0.25 % IJ SOLN
INTRAMUSCULAR | Status: AC
  Filled 2016-12-01: qty 10

## 2016-12-01 MED ORDER — ACETAMINOPHEN 10 MG/ML IV SOLN
INTRAVENOUS | Status: DC | PRN
  Administered 2016-12-01: 1000 mg via INTRAVENOUS

## 2016-12-01 MED ORDER — ONDANSETRON HCL 4 MG/2ML IJ SOLN
INTRAMUSCULAR | Status: DC | PRN
  Administered 2016-12-01: 4 mg via INTRAVENOUS

## 2016-12-01 MED ORDER — GLYCOPYRROLATE 0.2 MG/ML IJ SOLN
INTRAMUSCULAR | Status: DC | PRN
  Administered 2016-12-01: 0.2 mg via INTRAVENOUS

## 2016-12-01 MED ORDER — HYDROMORPHONE HCL 1 MG/ML IJ SOLN
1.0000 mg | INTRAMUSCULAR | Status: DC | PRN
  Administered 2016-12-01 – 2016-12-02 (×4): 1 mg via INTRAVENOUS
  Filled 2016-12-01 (×4): qty 1

## 2016-12-01 MED ORDER — FENTANYL CITRATE (PF) 100 MCG/2ML IJ SOLN
INTRAMUSCULAR | Status: AC
  Filled 2016-12-01: qty 2

## 2016-12-01 MED ORDER — FENTANYL CITRATE (PF) 100 MCG/2ML IJ SOLN
INTRAMUSCULAR | Status: AC
Start: 2016-12-01 — End: 2016-12-02
  Filled 2016-12-01: qty 2

## 2016-12-01 MED ORDER — LIRAGLUTIDE -WEIGHT MANAGEMENT 18 MG/3ML ~~LOC~~ SOPN: 3.0000 mg | PEN_INJECTOR | Freq: Every day | SUBCUTANEOUS | Status: DC

## 2016-12-01 MED ORDER — FAMOTIDINE 20 MG PO TABS
ORAL_TABLET | ORAL | Status: AC
  Administered 2016-12-01: 20 mg via ORAL
  Filled 2016-12-01: qty 1

## 2016-12-01 MED ORDER — DOCUSATE SODIUM 100 MG PO CAPS
100.0000 mg | ORAL_CAPSULE | Freq: Every day | ORAL | Status: DC | PRN
  Administered 2016-12-03: 100 mg via ORAL
  Filled 2016-12-01: qty 1

## 2016-12-01 MED ORDER — ROCURONIUM BROMIDE 50 MG/5ML IV SOLN
INTRAVENOUS | Status: AC
Start: 2016-12-01 — End: 2016-12-01
  Filled 2016-12-01: qty 1

## 2016-12-01 MED ORDER — METAXALONE 800 MG PO TABS
800.0000 mg | ORAL_TABLET | Freq: Three times a day (TID) | ORAL | Status: DC | PRN
  Filled 2016-12-01: qty 1

## 2016-12-01 MED ORDER — HYDROMORPHONE HCL 1 MG/ML IJ SOLN
INTRAMUSCULAR | Status: AC
  Administered 2016-12-01: 1 mg
  Filled 2016-12-01: qty 1

## 2016-12-01 MED ORDER — ROCURONIUM BROMIDE 50 MG/5ML IV SOLN
INTRAVENOUS | Status: AC
  Filled 2016-12-01: qty 1

## 2016-12-01 MED ORDER — IRBESARTAN 150 MG PO TABS
300.0000 mg | ORAL_TABLET | Freq: Every day | ORAL | Status: DC
  Administered 2016-12-02 – 2016-12-03 (×2): 300 mg via ORAL
  Filled 2016-12-01 (×2): qty 2

## 2016-12-01 MED ORDER — SUGAMMADEX SODIUM 500 MG/5ML IV SOLN
INTRAVENOUS | Status: AC
  Filled 2016-12-01: qty 5

## 2016-12-01 MED ORDER — FENTANYL CITRATE (PF) 250 MCG/5ML IJ SOLN
INTRAMUSCULAR | Status: AC
  Filled 2016-12-01: qty 5

## 2016-12-01 MED ORDER — CEFAZOLIN SODIUM-DEXTROSE 2-4 GM/100ML-% IV SOLN: 2.0000 g | INTRAVENOUS | Status: DC

## 2016-12-01 MED ORDER — SEVOFLURANE IN SOLN
RESPIRATORY_TRACT | Status: AC
Start: 2016-12-01 — End: 2016-12-01
  Filled 2016-12-01: qty 250

## 2016-12-01 MED ORDER — LACTATED RINGERS IV SOLN
INTRAVENOUS | Status: DC
  Administered 2016-12-01 (×2): via INTRAVENOUS

## 2016-12-01 MED ORDER — BUPIVACAINE LIPOSOME 1.3 % IJ SUSP
INTRAMUSCULAR | Status: AC
Start: 2016-12-01 — End: 2016-12-01
  Filled 2016-12-01: qty 20

## 2016-12-01 MED ORDER — FLUTICASONE PROPIONATE 50 MCG/ACT NA SUSP
2.0000 | Freq: Every day | NASAL | Status: DC | PRN
  Filled 2016-12-01: qty 16

## 2016-12-01 MED ORDER — ALBUTEROL SULFATE (2.5 MG/3ML) 0.083% IN NEBU
2.5000 mg | INHALATION_SOLUTION | Freq: Four times a day (QID) | RESPIRATORY_TRACT | Status: DC | PRN
Start: 2016-12-01 — End: 2016-12-03

## 2016-12-01 MED ORDER — FENTANYL CITRATE (PF) 100 MCG/2ML IJ SOLN
INTRAMUSCULAR | Status: AC
  Administered 2016-12-01: 50 ug via INTRAVENOUS
  Filled 2016-12-01: qty 2

## 2016-12-01 MED ORDER — SUGAMMADEX SODIUM 200 MG/2ML IV SOLN
INTRAVENOUS | Status: DC | PRN
  Administered 2016-12-01: 250 mg via INTRAVENOUS

## 2016-12-01 MED ORDER — PHENYLEPHRINE HCL 10 MG/ML IJ SOLN
INTRAMUSCULAR | Status: DC | PRN
  Administered 2016-12-01: 75 ug via INTRAVENOUS
  Administered 2016-12-01: 25 ug via INTRAVENOUS

## 2016-12-01 MED ORDER — BUPROPION HCL ER (XL) 150 MG PO TB24
150.0000 mg | ORAL_TABLET | Freq: Every day | ORAL | Status: DC
  Administered 2016-12-02 – 2016-12-03 (×2): 150 mg via ORAL
  Filled 2016-12-01 (×2): qty 1

## 2016-12-01 MED ORDER — LIDOCAINE HCL (PF) 1 % IJ SOLN
INTRAMUSCULAR | Status: AC
  Filled 2016-12-01: qty 5

## 2016-12-01 MED ORDER — POVIDONE-IODINE 7.5 % EX SOLN
Freq: Once | CUTANEOUS | Status: DC
  Filled 2016-12-01: qty 118

## 2016-12-01 MED ORDER — ACETAMINOPHEN 10 MG/ML IV SOLN
INTRAVENOUS | Status: AC
  Filled 2016-12-01: qty 100

## 2016-12-01 MED ORDER — FENTANYL CITRATE (PF) 100 MCG/2ML IJ SOLN
INTRAMUSCULAR | Status: DC | PRN
  Administered 2016-12-01 (×3): 50 ug via INTRAVENOUS
  Administered 2016-12-01: 25 ug via INTRAVENOUS
  Administered 2016-12-01 (×3): 50 ug via INTRAVENOUS

## 2016-12-01 MED ORDER — LIDOCAINE HCL (CARDIAC) 20 MG/ML IV SOLN
INTRAVENOUS | Status: DC | PRN
  Administered 2016-12-01: 60 mg via INTRAVENOUS

## 2016-12-01 MED ORDER — ROPIVACAINE HCL 5 MG/ML IJ SOLN
INTRAMUSCULAR | Status: DC | PRN
  Administered 2016-12-01: 30 mL via EPIDURAL

## 2016-12-01 MED ORDER — FENTANYL CITRATE (PF) 100 MCG/2ML IJ SOLN
50.0000 ug | Freq: Once | INTRAMUSCULAR | Status: AC
  Administered 2016-12-01: 50 ug via INTRAVENOUS

## 2016-12-01 MED ORDER — LIDOCAINE HCL (PF) 1 % IJ SOLN
INTRAMUSCULAR | Status: AC
  Filled 2016-12-01: qty 30

## 2016-12-01 MED ORDER — FERROUS SULFATE 325 (65 FE) MG PO TABS
325.0000 mg | ORAL_TABLET | Freq: Every day | ORAL | Status: DC
  Administered 2016-12-02: 325 mg via ORAL
  Filled 2016-12-01 (×2): qty 1

## 2016-12-01 MED ORDER — LIDOCAINE HCL (PF) 2 % IJ SOLN
INTRAMUSCULAR | Status: AC
  Filled 2016-12-01: qty 10

## 2016-12-01 MED ORDER — SUCCINYLCHOLINE CHLORIDE 20 MG/ML IJ SOLN
INTRAMUSCULAR | Status: AC
  Filled 2016-12-01: qty 1

## 2016-12-01 MED ORDER — PROPOFOL 10 MG/ML IV BOLUS
INTRAVENOUS | Status: DC | PRN
  Administered 2016-12-01: 50 mg via INTRAVENOUS
  Administered 2016-12-01: 150 mg via INTRAVENOUS

## 2016-12-01 MED ORDER — MIDAZOLAM HCL 2 MG/2ML IJ SOLN
INTRAMUSCULAR | Status: AC
  Administered 2016-12-01: 1 mg via INTRAVENOUS
  Filled 2016-12-01: qty 2

## 2016-12-01 MED ORDER — LORATADINE 10 MG PO TABS
10.0000 mg | ORAL_TABLET | Freq: Every day | ORAL | Status: DC | PRN
Start: 2016-12-01 — End: 2016-12-03

## 2016-12-01 MED ORDER — CEFAZOLIN SODIUM-DEXTROSE 2-4 GM/100ML-% IV SOLN
INTRAVENOUS | Status: AC
  Filled 2016-12-01: qty 100

## 2016-12-01 MED ORDER — ONDANSETRON HCL 4 MG/2ML IJ SOLN: 4.0000 mg | Freq: Once | INTRAMUSCULAR | Status: DC | PRN

## 2016-12-01 MED ORDER — MIDAZOLAM HCL 2 MG/2ML IJ SOLN
INTRAMUSCULAR | Status: AC
  Filled 2016-12-01: qty 2

## 2016-12-01 SURGICAL SUPPLY — 72 items
ASMB FX 20X17 STPL INSRTR (Staple) ×4 IMPLANT
BANDAGE ELASTIC 4 LF NS (GAUZE/BANDAGES/DRESSINGS) ×6 IMPLANT
BANDAGE STRETCH 3X4.1 STRL (GAUZE/BANDAGES/DRESSINGS) ×3 IMPLANT
BIT DRILL 2 FENESTRATED (MISCELLANEOUS) ×1 IMPLANT
BIT DRILL CANNULATED 4.6 (BIT) ×1 IMPLANT
BIT DRILLL 2 FENESTRATED (MISCELLANEOUS) ×1
BLADE SURG 15 STRL LF DISP TIS (BLADE) ×4 IMPLANT
BLADE SURG 15 STRL SS (BLADE) ×6
BNDG CMPR 75X21 PLY HI ABS (MISCELLANEOUS) ×2
BNDG CMPR MED 5X4 ELC HKLP NS (GAUZE/BANDAGES/DRESSINGS) ×4
BNDG COHESIVE 4X5 TAN STRL (GAUZE/BANDAGES/DRESSINGS) ×4 IMPLANT
BNDG ESMARK 4X12 TAN STRL LF (GAUZE/BANDAGES/DRESSINGS) ×3 IMPLANT
BNDG GAUZE 4.5X4.1 6PLY STRL (MISCELLANEOUS) ×3 IMPLANT
BUR 4X45 EGG (BURR) ×3 IMPLANT
CANISTER SUCT 1200ML W/VALVE (MISCELLANEOUS) ×3 IMPLANT
COUNTERSINK 7.0 (MISCELLANEOUS) ×3
COVER PIN YLW 0.028-062 (MISCELLANEOUS) ×6 IMPLANT
CUFF TOURN 18 STER (MISCELLANEOUS) ×3 IMPLANT
CUFF TOURN SGL QUICK 24 (TOURNIQUET CUFF) ×3
CUFF TRNQT CYL 24X4X40X1 (TOURNIQUET CUFF) ×2 IMPLANT
DRAPE C-ARM XRAY 36X54 (DRAPES) ×3 IMPLANT
DRAPE C-ARMOR (DRAPES) ×3 IMPLANT
DURAPREP 26ML APPLICATOR (WOUND CARE) ×3 IMPLANT
ELECT REM PT RETURN 9FT ADLT (ELECTROSURGICAL) ×3
ELECTRODE REM PT RTRN 9FT ADLT (ELECTROSURGICAL) ×2 IMPLANT
GAUZE PETRO XEROFOAM 1X8 (MISCELLANEOUS) ×3 IMPLANT
GAUZE SPONGE 4X4 12PLY STRL (GAUZE/BANDAGES/DRESSINGS) ×3 IMPLANT
GAUZE STRETCH 2X75IN STRL (MISCELLANEOUS) ×3 IMPLANT
GLOVE BIO SURGEON STRL SZ7.5 (GLOVE) ×3 IMPLANT
GLOVE INDICATOR 8.0 STRL GRN (GLOVE) ×3 IMPLANT
GOWN STRL REUS W/ TWL LRG LVL3 (GOWN DISPOSABLE) ×4 IMPLANT
GOWN STRL REUS W/TWL LRG LVL3 (GOWN DISPOSABLE) ×6
GRAFT TRIN ELITE MED MUSC TRAN (Graft) ×2 IMPLANT
K-WIRE SINGLE TROCAR 2.3X230 (WIRE) ×3
KIT ARCUS SIZING TEMPLATE STRL (KITS) ×1 IMPLANT
KIT RM TURNOVER STRD PROC AR (KITS) ×3 IMPLANT
KIT STAPLE ARCUS 20X17 STRL (Staple) ×6 IMPLANT
KWIRE SINGLE TROCAR 2.3X230 (WIRE) IMPLANT
NDL FILTER BLUNT 18X1 1/2 (NEEDLE) ×2 IMPLANT
NDL HYPO 25X1 1.5 SAFETY (NEEDLE) ×4 IMPLANT
NEEDLE FILTER BLUNT 18X 1/2SAF (NEEDLE) ×1
NEEDLE FILTER BLUNT 18X1 1/2 (NEEDLE) ×2 IMPLANT
NEEDLE HYPO 25X1 1.5 SAFETY (NEEDLE) ×6 IMPLANT
NS IRRIG 1000ML POUR BTL (IV SOLUTION) ×3 IMPLANT
PACK EXTREMITY ARMC (MISCELLANEOUS) ×3 IMPLANT
PADDING CAST BLEND 4X4 NS (MISCELLANEOUS) ×3 IMPLANT
SCREW 7.0X60 ST (Screw) ×1 IMPLANT
SCREW CAN SHORT THREAD 7.0X65 (Screw) ×1 IMPLANT
SCREW COUNTERSINK 7.0 (MISCELLANEOUS) IMPLANT
SLEEVE PROTECTION STRL DISP (MISCELLANEOUS) ×1 IMPLANT
SPLINT CAST 1 STEP 4X30 (MISCELLANEOUS) ×3 IMPLANT
SPLINT FAST PLASTER 5X30 (CAST SUPPLIES) ×1
SPLINT PLASTER CAST FAST 5X30 (CAST SUPPLIES) ×2 IMPLANT
SPONGE XRAY 4X4 16PLY STRL (MISCELLANEOUS) ×1 IMPLANT
STAPLE ASSEMBLY ARCUS 20X17 (Staple) ×4 IMPLANT
STIMULATOR BONE (ORTHOPEDIC SUPPLIES) ×1
STIMULATOR BONE GROWTH EMG EXT (ORTHOPEDIC SUPPLIES) IMPLANT
STOCKINETTE M/LG 89821 (MISCELLANEOUS) ×3 IMPLANT
STRIP CLOSURE SKIN 1/4X4 (GAUZE/BANDAGES/DRESSINGS) ×3 IMPLANT
SUT ETHILON 3-0 FS-10 30 BLK (SUTURE) ×6
SUT ETHILON 4-0 (SUTURE) ×3
SUT ETHILON 4-0 FS2 18XMFL BLK (SUTURE) ×2
SUT VIC AB 2-0 SH 27 (SUTURE) ×6
SUT VIC AB 2-0 SH 27XBRD (SUTURE) ×2 IMPLANT
SUT VIC AB 3-0 SH 27 (SUTURE) ×6
SUT VIC AB 3-0 SH 27X BRD (SUTURE) ×3 IMPLANT
SUT VIC AB 4-0 FS2 27 (SUTURE) ×3 IMPLANT
SUTURE EHLN 3-0 FS-10 30 BLK (SUTURE) ×3 IMPLANT
SUTURE ETHLN 4-0 FS2 18XMF BLK (SUTURE) ×2 IMPLANT
SYRINGE 10CC LL (SYRINGE) ×3 IMPLANT
TOWEL OR 17X26 4PK STRL BLUE (TOWEL DISPOSABLE) ×3 IMPLANT
WIRE Z .062 C-WIRE SPADE TIP (WIRE) ×9 IMPLANT

## 2016-12-01 NOTE — Progress Notes (Signed)
Bone stimulator on right foot

## 2016-12-01 NOTE — Op Note (Signed)
Operative note   Surgeon:Amayrany Cafaro [employer] logistics/support/administrative officer: Dr. Linus Galas    Preop diagnosis: 1. 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 Equinus deformity right lower leg    Postop diagnosis: Same    Procedure:1. Triple arthrodesis right lower extremity 2. Percutaneous tendo Achilles lengthening right leg    EBL: 50 ML's    Anesthesia:regional and general patient underwent a popliteal block of the right lower leg preoperatively.    Hemostasis: Pneumatic thigh tourniquet inflated to 250 mmHg for 120 minutes    Specimen: Inflammatory tissue right subtalar joint    Complications: None    Operative indications:Kathleen Conley is an 46 y.o. that presents today for surgical intervention.  The risks/benefits/alternatives/complications have been discussed and consent has been given.    Procedure:  Patient was brought into the OR and placed on the operating table in thesupine position. After anesthesia was obtained theright lower extremity was prepped and draped in usual sterile fashion.  Attention was directed to the posterior aspect of the Achilles at 13 and 5 cm small hemisections were performed of the Achilles tendon. The foot was placed in a neutral position and dorsiflexed and good excursion of the Achilles tendon was palpated and audible. The incision sites were then closed with a 3-0 nylon. The tourniquet was then inflated and a Ollier incision was made overlying the subtalar joint and calcaneocuboid joint. Sharp and blunt dissection was carried down to the extensor digitorum muscle belly. This was reflected distally. The subtalar joint was entered and the calcaneocuboid joint was exposed. At this time all articular cartilage was removed with a combination of a bur and curet and osteotome. This was taken down to good healthy bleeding bone from the subtalar and calcaneocuboid joints. Attention was then directed to the dorsal aspect on the anterior aspect of the ankle where a  longitudinal incision was made just lateral to the anterior tibial tendon. Sharp and blunt dissection down to the subcutaneous tissue and subcutaneous also dissection was undertaken exposing the talonavicular joint. Care was taken to retract the neurovascular structures in this region. The head of the talus and base of the navicular was exposed and all articular cartilage was removed with a combination of a curette osteotome and bur. All areas were then penetrated with a 2 mm punch drill bit. This was in preparation for the final fusion. The posterior facet was initially fused the area was initially packed with a small amount of Trinity bone graft. 2 large 7 mm screws were placed from the posterior calcaneus crossing the subtalar joint into the talus. These were noted be very compressed and the calcaneus was held in a neutral position during compression. Next the talonavicular joint was fused with 3 large 2 cm staples. The calcaneocuboid joint was fused with 2 large compression staples. All joints were filled with Trinity bone graft prior to final fusion. At this time all areas were noted to be very stable and solid. Good alignment was noted in all planes. Wounds were flushed with copious amounts of irrigation. Layered closure was then performed with 203 0 Vicryl and 3-0 nylon to all areas. All incision sites were infiltrated with a 50-50 mixture of 0.25% bupivacaine and Exparel long-acting Marcaine. A total of 20 cc of each were used. Patient was then placed in a compressive sterile dressing. Posterior splint was then fabricated. She will be admitted to 23 hour observations.    Patient tolerated the procedure and anesthesia well.  Was transported from the  OR to the PACU with all vital signs stable and vascular status intact. To be discharged per routine protocol.  Will follow up in approximately 1 week in the outpatient clinic.

## 2016-12-01 NOTE — Anesthesia Procedure Notes (Signed)
Anesthesia Regional Block: Popliteal block   Pre-Anesthetic Checklist: ,, timeout performed, Correct Patient, Correct Site, Correct Laterality, Correct Procedure, Correct Position, site marked, Risks and benefits discussed,  Surgical consent,  Pre-op evaluation,  At surgeon's request and post-op pain management  Laterality: Lower  Prep: chloraprep       Needles:  Injection technique: Single-shot  Needle Type: Echogenic Needle     Needle Length: 9cm  Needle Gauge: 21     Additional Needles:   Procedures:,,,, ultrasound used (permanent image in chart),,,,  Narrative:  Injection made incrementally with aspirations every 5 mL.  Performed by: Personally  Anesthesiologist: Margorie John K  Additional Notes: Functioning IV was confirmed and monitors were applied.  A echogenic needle was used. Sterile prep,hand hygiene and sterile gloves were used. Minimal sedation used for procedure.   No paresthesia endorsed by patient during the procedure.  Negative aspiration and negative test dose prior to incremental administration of local anesthetic. The patient tolerated the procedure well with no immediate complications.

## 2016-12-01 NOTE — Progress Notes (Signed)
Pharmacy Anticoagulation Note  45 y/o F ordered Lovenox 40 mg daily for DVT prophylaxis.   Wt= 111 kg, Ht= 62 in, BMI= 44  Estimated Creatinine Clearance: 102.2 mL/min (by C-G formula based on SCr of 0.6 mg/dL).  There were no vitals filed for this visit.  There is no height or weight on file to calculate BMI.  Will increase Lovenox dosing to 40 mg bid for weight > 100 kg and BMI > 40.   Luisa Hart, PharmD Clinical Pharmacist

## 2016-12-01 NOTE — OR Nursing (Signed)
Urine pregnancy test ordered. Patient unable to provide urine sample. Discussed with Dr Maisie Fus. Patient states she cannot possibly be pregnant. Is not sexually active and has an IUD in place.

## 2016-12-01 NOTE — Anesthesia Postprocedure Evaluation (Signed)
Anesthesia Post Note  Patient: Kathleen Conley  Procedure(s) Performed: TENDON LENGTHENING-PTAL (Right Leg Lower) ARTHRODESIS FOOT; TRIPLE (Right Foot)  Patient location during evaluation: PACU Anesthesia Type: General Level of consciousness: awake and alert Pain management: pain level controlled Vital Signs Assessment: post-procedure vital signs reviewed and stable Respiratory status: spontaneous breathing, nonlabored ventilation, respiratory function stable and patient connected to nasal cannula oxygen Cardiovascular status: blood pressure returned to baseline and stable Postop Assessment: no apparent nausea or vomiting Anesthetic complications: no     Last Vitals:  Vitals:   12/01/16 1423 12/01/16 1458  BP: (!) 141/83   Pulse: 89   Resp: 15 18  Temp:  36.4 C  SpO2: 97% 98%    Last Pain:  Vitals:   12/01/16 1458  TempSrc: Axillary  PainSc: Asleep                 Daylani Deblois S

## 2016-12-01 NOTE — Anesthesia Postprocedure Evaluation (Signed)
Anesthesia Post Note  Patient: Tujuana Heyden  Procedure(s) Performed: TENDON LENGTHENING-PTAL (Right Leg Lower) ARTHRODESIS FOOT; TRIPLE (Right Foot)  Patient location during evaluation: PACU Anesthesia Type: General Level of consciousness: awake and alert Pain management: pain level controlled Vital Signs Assessment: post-procedure vital signs reviewed and stable Respiratory status: spontaneous breathing, nonlabored ventilation, respiratory function stable and patient connected to nasal cannula oxygen Cardiovascular status: blood pressure returned to baseline and stable Postop Assessment: no apparent nausea or vomiting Anesthetic complications: no     Last Vitals:  Vitals:   12/01/16 1423 12/01/16 1458  BP: (!) 141/83   Pulse: 89   Resp: 15 18  Temp:  36.4 C  SpO2: 97% 98%    Last Pain:  Vitals:   12/01/16 1458  TempSrc: Axillary  PainSc: Asleep                 Levonia Wolfley S     

## 2016-12-01 NOTE — Anesthesia Post-op Follow-up Note (Signed)
Anesthesia QCDR form completed.        

## 2016-12-01 NOTE — Transfer of Care (Signed)
Immediate Anesthesia Transfer of Care Note  Patient: Kathleen Conley  Procedure(s) Performed: TENDON LENGTHENING-PTAL (Right Leg Lower) ARTHRODESIS FOOT; TRIPLE (Right Foot)  Patient Location: PACU  Anesthesia Type:General  Level of Consciousness: sedated  Airway & Oxygen Therapy: Patient Spontanous Breathing and Patient connected to face mask oxygen  Post-op Assessment: Report given to RN and Post -op Vital signs reviewed and stable  Post vital signs: Reviewed and stable  Last Vitals:  Vitals:   12/01/16 0916 12/01/16 0921  BP: (!) 153/98   Pulse: 90 89  Resp: 20 18  Temp:    SpO2: 99% 98%    Last Pain:  Vitals:   12/01/16 0851  TempSrc:   PainSc: 0-No pain         Complications: No apparent anesthesia complications

## 2016-12-01 NOTE — Anesthesia Preprocedure Evaluation (Addendum)
Anesthesia Evaluation  Patient identified by MRN, date of birth, ID band Patient awake    Reviewed: Allergy & Precautions, NPO status , Patient's Chart, lab work & pertinent test results, reviewed documented beta blocker date and time   Airway Mallampati: III  TM Distance: >3 FB     Dental  (+) Chipped   Pulmonary sleep apnea ,           Cardiovascular hypertension, Pt. on medications      Neuro/Psych PSYCHIATRIC DISORDERS Depression  Neuromuscular disease    GI/Hepatic   Endo/Other    Renal/GU      Musculoskeletal  (+) Arthritis , Fibromyalgia -  Abdominal   Peds  Hematology  (+) anemia ,   Anesthesia Other Findings Hx of PVCs. Echo in 2017 normal. Does not use CPAP. She cannot give [place] a urine sample for U-preg. She states she is not sexually active, with an IUD. She states she cannot be pregnant.   Reproductive/Obstetrics                           Anesthesia Physical Anesthesia Plan  ASA: III  Anesthesia Plan: General   Post-op Pain Management:    Induction: Intravenous  PONV Risk Score and Plan:   Airway Management Planned: Oral ETT and LMA  Additional Equipment:   Intra-op Plan:   Post-operative Plan:   Informed Consent: I have reviewed the patients History and Physical, chart, labs and discussed the procedure including the risks, benefits and alternatives for the proposed anesthesia with the patient or authorized representative who has indicated his/her understanding and acceptance.     Plan Discussed with: CRNA  Anesthesia Plan Comments:         Anesthesia Quick Evaluation

## 2016-12-01 NOTE — Addendum Note (Signed)
Addendum  created 12/01/16 1527 by Berdine Addison, MD   Anesthesia Intra Blocks edited, Anesthesia Intra Meds edited, Child order released for a procedure order, Sign clinical note

## 2016-12-01 NOTE — Progress Notes (Signed)
Called patient left vm of lab results.

## 2016-12-01 NOTE — Anesthesia Procedure Notes (Addendum)
Procedure Name: Intubation Date/Time: 12/01/2016 9:50 AM Performed by: Allean Found Pre-anesthesia Checklist: Patient identified, Emergency Drugs available, Suction available, Patient being monitored and Timeout performed Patient Re-evaluated:Patient Re-evaluated prior to induction Oxygen Delivery Method: Circle system utilized Preoxygenation: Pre-oxygenation with 100% oxygen Induction Type: IV induction Ventilation: Mask ventilation without difficulty Laryngoscope Size: Mac, 3 and McGraph Grade View: Grade III Tube type: Oral Tube size: 7.5 mm Number of attempts: 2 Airway Equipment and Method: Stylet Placement Confirmation: ETT inserted through vocal cords under direct vision,  positive ETCO2 and breath sounds checked- equal and bilateral Secured at: 21 cm Tube secured with: Tape Dental Injury: Teeth and Oropharynx as per pre-operative assessment  Difficulty Due To: Difficulty was anticipated, Difficult Airway- due to reduced neck mobility, Difficult Airway- due to large tongue and Difficult Airway- due to anterior larynx Comments: DL x 1 with Mac 3, visualized epiglottis only, elevated head, lifted midback.  Immediately switched to [place] #3.  Success

## 2016-12-01 NOTE — H&P (Signed)
HISTORY AND PHYSICAL INTERVAL NOTE:  12/01/2016  8:49 AM  Kathleen Conley  has presented today for surgery, with the diagnosis of Foot pain-Right Tibialis posterior dysfunction.  The various methods of treatment have been discussed with the patient.  No guarantees were given.  After consideration of risks, benefits and other options for treatment, the patient has consented to surgery.  I have reviewed the patients' chart and labs.    Patient Vitals for the past 24 hrs:  BP Temp Temp src Pulse Resp SpO2  12/01/16 0841 - - - (!) 104 (!) 39 97 %  12/01/16 0836 (!) 159/104 - - (!) 101 17 100 %  12/01/16 0831 - - - (!) 101 (!) 22 100 %  12/01/16 0826 (!) 140/102 - - (!) 112 (!) 27 100 %  12/01/16 0821 (!) 153/98 - - (!) 103 (!) 21 98 %  12/01/16 0748 (!) 178/98 98.2 F (36.8 C) Oral (!) 106 20 97 %    A history and physical examination was performed in my office.  The patient was reexamined.  There have been no changes to this history and physical examination.  [name] A

## 2016-12-02 DIAGNOSIS — M797 Fibromyalgia: Secondary | ICD-10-CM | POA: Diagnosis not present

## 2016-12-02 DIAGNOSIS — I10 Essential (primary) hypertension: Secondary | ICD-10-CM | POA: Diagnosis not present

## 2016-12-02 DIAGNOSIS — Z888 Allergy status to other drugs, medicaments and biological substances status: Secondary | ICD-10-CM | POA: Diagnosis not present

## 2016-12-02 DIAGNOSIS — M1990 Unspecified osteoarthritis, unspecified site: Secondary | ICD-10-CM | POA: Diagnosis not present

## 2016-12-02 DIAGNOSIS — G4733 Obstructive sleep apnea (adult) (pediatric): Secondary | ICD-10-CM | POA: Diagnosis not present

## 2016-12-02 DIAGNOSIS — F329 Major depressive disorder, single episode, unspecified: Secondary | ICD-10-CM | POA: Diagnosis not present

## 2016-12-02 DIAGNOSIS — E669 Obesity, unspecified: Secondary | ICD-10-CM | POA: Diagnosis not present

## 2016-12-02 DIAGNOSIS — Q666 Other congenital valgus deformities of feet: Secondary | ICD-10-CM | POA: Diagnosis not present

## 2016-12-02 LAB — HIV ANTIBODY (ROUTINE TESTING W REFLEX): HIV Screen 4th Generation wRfx: NONREACTIVE

## 2016-12-02 MED ORDER — OXYCODONE-ACETAMINOPHEN 7.5-325 MG PO TABS: ORAL_TABLET | ORAL | 0 refills | Status: DC

## 2016-12-02 MED ORDER — KETOROLAC TROMETHAMINE 30 MG/ML IJ SOLN
30.0000 mg | Freq: Three times a day (TID) | INTRAMUSCULAR | Status: AC | PRN
  Administered 2016-12-02 (×2): 30 mg via INTRAVENOUS
  Filled 2016-12-02 (×2): qty 1

## 2016-12-02 MED ORDER — HYDROMORPHONE HCL 1 MG/ML IJ SOLN
1.0000 mg | INTRAMUSCULAR | Status: DC | PRN
Start: 2016-12-02 — End: 2016-12-03

## 2016-12-02 MED ORDER — ASPIRIN EC 81 MG PO TBEC: 81.0000 mg | DELAYED_RELEASE_TABLET | Freq: Every day | ORAL | Status: DC

## 2016-12-02 MED ORDER — OXYCODONE-ACETAMINOPHEN 5-325 MG PO TABS
2.0000 | ORAL_TABLET | ORAL | Status: DC | PRN
  Administered 2016-12-02 – 2016-12-03 (×5): 2 via ORAL
  Filled 2016-12-02 (×5): qty 2

## 2016-12-02 NOTE — Discharge Summary (Signed)
Physician Discharge Summary  Patient ID: Kathleen Conley MRN: [id number] DOB/AGE: Dec 25, 1970 46 y.o.  Admit date: 12/01/2016 Discharge date: 12/02/2016  Admission Diagnoses:  Post operative pain Discharge Diagnoses:  Active Problems:   Post-op pain   Past Medical History:  Diagnosis Date  . 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 Allergy   . Anemia    Iron Deficiency  . Arthritis   . Baker cyst   . Breast mass, right 01/18/2010  . Cardiac murmur   . Chronic constipation   . Depression   . Dysmetabolic syndrome   . Fibromyalgia   . Hairiness   . Hydradenitis   . Hypertension   . Incarcerated ventral hernia 09/29/2011  . Obesity   . Obstructive sleep apnea    no CPAP  . Plantar fasciitis   . PVC (premature ventricular contraction)     Surgeries: Procedure(s): TENDON LENGTHENING-PTAL ARTHRODESIS FOOT; TRIPLE on 12/01/2016   Consultants (if any):   Discharged Condition: Improved  Hospital Course: Kathleen Conley is an 46 y.o. female who was admitted 12/01/2016 with a diagnosis of <principal problem not specified> and went to the operating room on 12/01/2016 and underwent the above named procedures.    She was given perioperative antibiotics:  Anti-infectives    Start     Dose/Rate Route Frequency Ordered Stop   12/01/16 0749  ceFAZolin (ANCEF) 2-4 GM/100ML-% IVPB    Comments:  [place]   : cabinet override      12/01/16 0749 12/01/16 1959   12/01/16 0124  ceFAZolin (ANCEF) IVPB 2g/100 mL premix  Status:  Discontinued     2 g 200 mL/hr over 30 Minutes Intravenous On call to O.R. 12/01/16 0124 12/01/16 1432    .  She was given sequential compression devices, early ambulation, and lovenox for DVT prophylaxis.  She benefited maximally from the hospital stay and there were no complications.    Recent vital signs:  Vitals:   12/01/16 2357 12/02/16 0442  BP: 128/68 (!) 146/73  Pulse: 95 92  Resp: 20 20  Temp:  97.7 F (36.5 C) 98.6 F (37 C)  SpO2: 96%     Recent laboratory studies:  Lab Results  Component Value Date   HGB 11.3 (L) 12/01/2016   HGB 12.0 11/29/2016   HGB 12.7 05/31/2016   Lab Results  Component Value Date   WBC 14.1 (H) 12/01/2016   PLT 382 12/01/2016   No results found for: INR Lab Results  Component Value Date   NA 142 11/29/2016   K 4.3 11/29/2016   CL 106 11/29/2016   CO2 31 11/29/2016   BUN 12 11/29/2016   CREATININE 0.60 12/01/2016   GLUCOSE 83 11/29/2016    Discharge Medications:   Allergies as of 12/02/2016      Reactions   Hctz [hydrochlorothiazide] Cough   Lisinopril Cough         Medication List    TAKE these medications   acetaminophen 500 MG tablet Commonly known as:  TYLENOL Take 1 tablet (500 mg total) by mouth every 6 (six) hours as needed. Max of 3 grams daily or 6 pills dialy What changed:  how much to take  reasons to take this  additional instructions   albuterol 108 (90 Base) MCG/ACT inhaler Commonly known as:  PROVENTIL HFA;VENTOLIN HFA Inhale 2 puffs into the lungs every 6 (six) hours as needed for wheezing or shortness of breath.   Armodafinil 150 MG tablet Commonly known as:  NUVIGIL Take 1 tablet (150 mg total) by mouth daily.   aspirin EC 81 MG tablet Take 1 tablet (81 mg total) by mouth daily. What changed:  Another medication with the same name was added. Make sure you understand how and when to take each.   aspirin EC 81 MG tablet Take 1 tablet (81 mg total) by mouth daily. What changed:  You were already taking a medication with the same name, and this prescription was added. Make sure you understand how and when to take each.   buPROPion 150 MG 24 hr tablet Commonly known as:  WELLBUTRIN XL Take 1 tablet (150 mg total) by mouth daily.   COLACE 100 MG capsule Generic drug:  docusate sodium Take 100 mg by mouth daily as needed for moderate constipation.   DULoxetine 60 MG capsule Commonly known as:   CYMBALTA Take 1 capsule (60 mg total) by mouth daily.   ferrous sulfate 325 (65 FE) MG tablet Take 325 mg by mouth daily.   fluticasone 50 MCG/ACT nasal spray Commonly known as:  FLONASE Place 2 sprays into both nostrils daily. What changed:  when to take this  reasons to take this   ibuprofen 200 MG tablet Commonly known as:  ADVIL,MOTRIN Take 400 mg by mouth every 6 (six) hours as needed for headache or moderate pain.   Insulin Pen Needle 30G X 8 MM Misc Commonly known as:  NOVOFINE Inject 10 each into the skin as needed.   Liraglutide -Weight Management 18 MG/3ML Sopn Commonly known as:  SAXENDA Inject 3 mg into the skin daily.   loratadine 10 MG tablet Commonly known as:  CLARITIN Take 1 tablet (10 mg total) by mouth daily. What changed:  when to take this  reasons to take this   metaxalone 800 MG tablet Commonly known as:  SKELAXIN Take 1 tablet (800 mg total) by mouth 3 (three) times daily. What changed:  when to take this  reasons to take this   MIRENA (52 MG) 20 MCG/24HR IUD Generic drug:  levonorgestrel 1 each by Intrauterine route once.   MULTIVITAMIN PO Take 1 tablet by mouth daily.   olmesartan 40 MG tablet Commonly known as:  BENICAR Take 1 tablet (40 mg total) by mouth daily.   oxyCODONE-acetaminophen 7.5-325 MG tablet Commonly known as:  PERCOCET 1-2 tablets q 6 hours prn pain   Vitamin D 2000 units tablet Take 2,000 Units by mouth daily.       Diagnostic Studies: Dg Foot 2 Views Right  Result Date: 12/01/2016 CLINICAL DATA:  Right foot surgery. EXAM: DG C-ARM GT 120 MIN; RIGHT FOOT - 2 VIEW CONTRAST:  No prior. FLUOROSCOPY TIME:  Fluoroscopy Time:  0 minutes 46 seconds. Number of Acquired Spot Images: 11 COMPARISON:  No recent prior. FINDINGS: Postsurgical changes noted of the calcaneus and forefoot. Hardware intact. Anatomic alignment. IMPRESSION: Postsurgical changes right calcaneus and forefoot. Electronically Signed   By: Maisie Fus   Register   On: 12/01/2016 14:29   Dg C-arm Gt 120 Min  Result Date: 12/01/2016 CLINICAL DATA:  Right foot surgery. EXAM: DG C-ARM GT 120 MIN; RIGHT FOOT - 2 VIEW CONTRAST:  No prior. FLUOROSCOPY TIME:  Fluoroscopy Time:  0 minutes 46 seconds. Number of Acquired Spot Images: 11 COMPARISON:  No recent prior. FINDINGS: Postsurgical changes noted of the calcaneus and forefoot. Hardware intact. Anatomic alignment. IMPRESSION: Postsurgical changes right calcaneus and forefoot. Electronically Signed   By: Maisie Fus  Register   On: 12/01/2016 14:29  Disposition: 01-Home or Self Care  Discharge Instructions    Diet - low sodium heart healthy    Complete by:  As directed    Discharge instructions    Complete by:  As directed    [facility]  POST OPERATIVE INSTRUCTIONS FOR DR. TROXLER AND DR[facility]   Take your medication as prescribed.  Pain medication should be taken only as needed.  Keep the dressing clean, dry and intact.  Keep your foot elevated above the heart level for the first 48 hours.  You have been instructed to be non-weight bearing.  You should remain sedentary only to get up to move as necessary.    Keep your foot elevated above the level of your heart when not ambulating.  Always wear your post-op shoe when walking.  Always use your crutches if you are to be non-weight bearing.  Do not take a shower. Baths are permissible as long as the foot is kept out of the water.   Every hour you are awake:  Bend your knee 15 times.   Call [facility] [phone]) if any of the following problems occur: You develop a temperature or fever. The bandage becomes saturated with blood. Medication does not stop your pain. Injury of the foot occurs. Any symptoms of infection including redness, odor, or red streaks running from wound.   Increase activity slowly    Complete by:  As directed    You  should remain non weight bearing at all times. Use your crutched, walker, scooter or wheelchair as necessary to keep all weight off of your surgical foot.         Signed: [name] A 12/02/2016, 8:28 AM

## 2016-12-02 NOTE — Plan of Care (Signed)
Problem: Acute Rehab PT Goals(only PT should resolve) Goal: Pt Will Ambulate With Knee Scooter Goal: Pt Will Go Up/Down Stairs Pt will verbalize understanding of stair training via scooting up on buttocks and give instructions to persons assisting with standing transfer at top of steps.

## 2016-12-02 NOTE — Progress Notes (Signed)
Pt unable to tolerate pain on PO meds alone. Discharge for today cancelled, Dr. Ether Griffins to reassess tomorrow AM.

## 2016-12-02 NOTE — Progress Notes (Signed)
Physical Therapy Treatment Patient Details Name: Kathleen Conley MRN: [id number] DOB: 1970/08/29 Today's Date: 12/02/2016    History of Present Illness 1. 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 Equinus deformity right lower leg; s/p :1. Triple arthrodesis right lower extremity 2. Percutaneous tendo Achilles lengthening right leg    PT Comments    Pt continues to struggle with ambulation using RW and with safety during sit<>stand transfers with frequent cues for hand placement and sequencing.  Pt becomes anxious and self limiting, stating she is unable to complete task.  Pt required extensive stair training with +2 assist from PT's and +2 assist from family for encouragement and safety.  Pt needed rest break prior to attempting steps due to anxiety.  Pt was able to complete scooting up one step and standing up from steps with assist.  Pt has 14 steps to enter apartment and will have minimum 2 sons present to assist with this task.  Educated pt and family on safety techniques for steps including use of belt and positioning to prevent falls and injury.  Pt reassured at end of session and wants to continue pursuing home discharge.     Follow Up Recommendations  Home health PT     Equipment Recommendations  None recommended by PT    Recommendations for Other Services       Precautions / Restrictions Precautions Precautions: Fall Required Braces or Orthoses: Other Brace/Splint Other Brace/Splint: soft splint R ankle Restrictions Weight Bearing Restrictions: Yes RLE Weight Bearing: Non weight bearing Other Position/Activity Restrictions: elevate    Mobility  Bed Mobility Overal bed mobility: Modified Independent             General bed mobility comments: Supine<>sit wiht Mod I.  Transfers Overall transfer level: Needs assistance Equipment used: Rolling walker (2 wheeled) Transfers: Sit to/from Stand Sit to Stand: Supervision         General transfer  comment: Continues to need cues for hand placement on seated surface to push up and to back up to bed/chair (touching with leg) prior to sitting.  Ambulation/Gait Ambulation/Gait assistance: Min guard Ambulation Distance (Feet): 5 Feet Assistive device: Rolling walker (2 wheeled) Gait Pattern/deviations: Step-to pattern;Shuffle     General Gait Details: Pt amb about 3-5 feet and pivoted from bed<>BSC with RW, improved balance from AM session, but still cannot tolerate distances greater than 5 feet.   Stairs Stairs: Yes   Stair Management: Backwards (seated) Number of Stairs: 4 General stair comments: Pt asssited into seated position on steps and instructed on scooting up/down steps using UE's with +1 person holding R LE to ensure NWB'ing and for pain relief.  Mod A to rise from 3rd step using rail and hand held assist.  Wheelchair Mobility    Modified Rankin (Stroke Patients Only)       Balance                                            Cognition Arousal/Alertness: Awake/alert Behavior During Therapy: Anxious Overall Cognitive Status: Within Functional Limits for tasks assessed                                 General Comments: Increased anxiety as PT session progressed, especially when challenged by stair training.      Exercises  General Comments        Pertinent Vitals/Pain Pain Assessment: 0-10 Pain Score: 8  Pain Location: R ankle Pain Descriptors / Indicators: Aching;Throbbing Pain Intervention(s): Limited activity within patient's tolerance;Monitored during session    Home Living                      Prior Function            PT Goals (current goals can now be found in the care plan section) Acute Rehab PT Goals Patient Stated Goal: To go home. PT Goal Formulation: With patient Time For Goal Achievement: 12/09/16 Potential to Achieve Goals: Good Progress towards PT goals: Progressing toward goals     Frequency    BID      PT Plan Current plan remains appropriate    Co-evaluation              AM-PAC PT "6 Clicks" Daily Activity  Outcome Measure  Difficulty turning over in bed (including adjusting bedclothes, sheets and blankets)?: None Difficulty moving from lying on back to sitting on the side of the bed? : None Difficulty sitting down on and standing up from a chair with arms (e.g., wheelchair, bedside commode, etc,.)?: A Little Help needed moving to and from a bed to chair (including a wheelchair)?: A Little Help needed walking in hospital room?: A Lot Help needed climbing 3-5 steps with a railing? : A Lot 6 Click Score: 18    End of Session Equipment Utilized During Treatment: Gait belt Activity Tolerance: Patient limited by pain Patient left: in bed;with call bell/phone within reach;with SCD's reapplied Nurse Communication: Mobility status PT Visit Diagnosis: Other abnormalities of gait and mobility (R26.89);Pain Pain - Right/Left: Right Pain - part of body: Ankle and joints of foot     Time: 9604-5409 PT Time Calculation (min) (ACUTE ONLY): 50 min  Charges:  $Gait Training: 23-37 mins $Therapeutic Activity: 8-22 mins                    G Codes:       Anjela Cassara A Reyn Faivre, PT 12/02/2016, 4:54 PM

## 2016-12-02 NOTE — Progress Notes (Signed)
Daily Progress Note   Subjective  - 1 Day Post-Op  POD #1 s/p triple arthrodesis.  Still c/o pain.  Given po percocet and IV dilaudid at this time.  Will add toradol if needed.  Objective Vitals:   12/01/16 1802 12/01/16 2054 12/01/16 2357 12/02/16 0442  BP: 135/89 136/71 128/68 (!) 146/73  Pulse: 90 91 95 92  Resp:  Temp:   97.7 F (36.5 C) 98.6 F (37 C)  TempSrc:   Oral Oral  SpO2: 94% 98% 96%   Weight:      Height:        Physical Exam: Dressing intact.   Pt alert.  No distress C/O 10/10 pain. Sensation intact to digits.  Laboratory CBC    Component Value Date/Time   WBC 14.1 (H) 12/01/2016 1533   HGB 11.3 (L) 12/01/2016 1533   HGB 12.6 06/18/2015 1122   HCT 35.2 12/01/2016 1533   HCT 38.8 06/18/2015 1122   PLT 382 12/01/2016 1533   PLT 343 06/18/2015 1122    BMET    Component Value Date/Time   NA 142 11/29/2016 0948   NA 141 06/18/2015 1122   NA 138 11/01/2012 1833   K 4.3 11/29/2016 0948   K 4.0 11/01/2012 1833   CL 106 11/29/2016 0948   CL 104 11/01/2012 1833   CO2 31 11/29/2016 0948   CO2 27 11/01/2012 1833   GLUCOSE 83 11/29/2016 0948   GLUCOSE 92 11/01/2012 1833   BUN 12 11/29/2016 0948   BUN 11 06/18/2015 1122   BUN 7 11/01/2012 1833   CREATININE 0.60 12/01/2016 1533   CREATININE 0.60 11/29/2016 0948   CALCIUM 9.2 11/29/2016 0948   CALCIUM 10.2 (H) 11/01/2012 1833   GFRNONAA >60 12/01/2016 1533   GFRNONAA 109 11/29/2016 0948   GFRAA >60 12/01/2016 1533   GFRAA 126 11/29/2016 0948    Assessment/Planning: S/P triple arthrodesis.   Still c/o pain.  Will add toradol prn.  Plan to d/c this pm if pain is controlled with po meds.  PT to see and evaluate.  If pain not controlled will keep till tomorrow.    [name] A  12/02/2016, 8:17 AM

## 2016-12-02 NOTE — Care Management Note (Signed)
Case Management Note  Patient Details  Name: Kathleen Conley MRN: [id number] Date of Birth: [birth year]  Subjective/Objective:    Discharge planning was discussed with [name] who chose [facility] to be her Home Health Services provider. A referral for HH=PT was called to Shaune Leeks at [facility]. [name] was notified that someone from [facility] would be in touch with her either tomorrow or Monday to schedule an appointment.                 Action/Plan:   Expected Discharge Date:  12/02/16               Expected Discharge Plan:  Home w Home Health Services  In-House Referral:  NA  Discharge planning Services  CM Consult  Post Acute Care Choice:  NA Choice offered to:  Patient  DME Arranged:  N/A DME Agency:  NA  HH Arranged:  PT HH Agency:  [facility]  Status of Service:  Completed, signed off  If discussed at Long Length of Stay Meetings, dates discussed:    Additional Comments:  [name], RN 12/02/2016, 12:50 PM

## 2016-12-02 NOTE — Evaluation (Signed)
Physical Therapy Evaluation Patient Details Name: Kathleen Conley MRN: [id number] DOB: [birth year] Today's Date: 12/02/2016   History of Present Illness  1. 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 Equinus deformity right lower leg; s/p :1. Triple arthrodesis right lower extremity 2. Percutaneous tendo Achilles lengthening right leg  Clinical Impression  Pt presents to PT with R ankle pain, diminished dynamic standing balance, and difficulty walking and would benefit from acute PT services to address objective findings.  Pt able to move to EOB fairly easily, but begins to have increased difficulty with standing and ambulation due to pain and fear.  Pt generally deconditioned and obese adding to her difficulty with mobilization post surgery. Will continue with transfers and gait training to ensure pt safety upon return home.    Follow Up Recommendations Home health PT    Equipment Recommendations  None recommended by PT    Recommendations for Other Services       Precautions / Restrictions Precautions Precautions: Fall Required Braces or Orthoses: Other Brace/Splint Other Brace/Splint: soft splint R ankle Restrictions Weight Bearing Restrictions: Yes RLE Weight Bearing: Non weight bearing Other Position/Activity Restrictions: elevate      Mobility  Bed Mobility Overal bed mobility: Modified Independent             General bed mobility comments: Extra effort to elevate trunk and get elbows under shoulders.  Transfers Overall transfer level: Needs assistance Equipment used: Rolling walker (2 wheeled) Transfers: Sit to/from Stand Sit to Stand: Min guard         General transfer comment: Difficulty rising using L hand pushing on bed and R on RW but able to complete with repeated trials; pt preferring to using B UE's on RW with a downward push; instructed pt not to grab front bar of walker.  Ambulation/Gait Ambulation/Gait assistance: Min assist Ambulation  Distance (Feet): 3 Feet Assistive device: Rolling walker (2 wheeled) Gait Pattern/deviations: Step-to pattern;Shuffle     General Gait Details: Poor balance with RW for forward ambulation 3 feet with LOB and needing Min A to maintain; increased pain and poor strength contributing to difficulty; Pt then transfered onto knee walker with Min A from standing position and ambulated 200' with CGA.  Pt instructed on braking and safety with turns.  Stairs Stairs: Yes       General stair comments: Verbally reviewed stair training options for pt's 14 steps; pt wanting to scoot up on buttocks with assist from her sons.  Will continue to educate pt on options.  Wheelchair Mobility    Modified Rankin (Stroke Patients Only)       Balance Overall balance assessment: Needs assistance Sitting-balance support: Bilateral upper extremity supported;Single extremity supported   Sitting balance - Comments: Good, maintains midline with no lean   Standing balance support: Bilateral upper extremity supported;During functional activity Standing balance-Leahy Scale: Fair Standing balance comment: LOB with ambulation using RW needing MinA to correct.                             Pertinent Vitals/Pain Pain Assessment: 0-10 Pain Score: 8  Pain Location: "The whole foot." Pain Descriptors / Indicators: Aching;Throbbing Pain Intervention(s): Limited activity within patient's tolerance;Monitored during session;Premedicated before session    Home Living Family/patient expects to be discharged to:: Private residence Living Arrangements: Children (2 sons) Available Help at Discharge: Family;Available PRN/intermittently Type of Home: Apartment Home Access: Stairs to enter Entrance Stairs-Rails: Right;Left;Can reach both Entrance [employer]  of Steps: 14 Home Layout: One level Home Equipment: Crutches;Grab bars - tub/shower (knee scooter)      Prior Function Level of Independence:  Independent         Comments: Works as Charity fundraiser at [employer]     Higher education careers adviser   Dominant Hand: Right    Extremity/Trunk Assessment   Upper Extremity Assessment Upper Extremity Assessment: Overall WFL for tasks assessed    Lower Extremity Assessment Lower Extremity Assessment: RLE deficits/detail RLE Deficits / Details: Pt able to lift leg off bed RLE: Unable to fully assess due to pain;Unable to fully assess due to immobilization    Cervical / Trunk Assessment Cervical / Trunk Assessment: Normal  Communication   Communication: No difficulties  Cognition Arousal/Alertness: Awake/alert Behavior During Therapy: WFL for tasks assessed/performed Overall Cognitive Status: Within Functional Limits for tasks assessed                                        General Comments General comments (skin integrity, edema, etc.): surgical dressing intact    Exercises Other Exercises Other Exercises: Education: stair training, use of assistive device; elevation of R LE when resting and avoiding dependent positions for Antonisha Waskey periods of time. Other Exercises: Assisted pt on/off BSC with CGA for balance; pt independent with sittign hygeine.   Assessment/Plan    PT Assessment Patient needs continued PT services  PT Problem List Decreased strength;Decreased activity tolerance;Decreased balance;Decreased knowledge of use of DME;Decreased safety awareness;Obesity;Pain       PT Treatment Interventions DME instruction;Gait training;Stair training;Functional mobility training;Therapeutic activities;Therapeutic exercise;Balance training;Patient/family education    PT Goals (Current goals can be found in the Care Plan section)  Acute Rehab PT Goals Patient Stated Goal: To go home. PT Goal Formulation: With patient Time For Goal Achievement: 12/09/16 Potential to Achieve Goals: Good    Frequency BID   Barriers to discharge Inaccessible home environment 14 steps to  enter apartment    Co-evaluation               AM-PAC PT "6 Clicks" Daily Activity  Outcome Measure Difficulty turning over in bed (including adjusting bedclothes, sheets and blankets)?: A Little Difficulty moving from lying on back to sitting on the side of the bed? : A Little Difficulty sitting down on and standing up from a chair with arms (e.g., wheelchair, bedside commode, etc,.)?: A Little Help needed moving to and from a bed to chair (including a wheelchair)?: A Little Help needed walking in hospital room?: A Lot Help needed climbing 3-5 steps with a railing? : A Lot 6 Click Score: 16    End of Session Equipment Utilized During Treatment: Gait belt Activity Tolerance: Patient limited by pain Patient left: in bed;with call bell/phone within reach;with SCD's reapplied Nurse Communication: Mobility status PT Visit Diagnosis: Other abnormalities of gait and mobility (R26.89);Pain Pain - Right/Left: Right Pain - part of body: Ankle and joints of foot    Time: 1015-1055 PT Time Calculation (min) (ACUTE ONLY): 40 min   Charges:   PT Evaluation $PT Eval Moderate Complexity: 1 Mod PT Treatments $Gait Training: 8-22 mins   PT G Codes:   PT G-Codes **NOT FOR INPATIENT CLASS** Functional Assessment Tool Used: AM-PAC 6 Clicks Basic Mobility Functional Limitation: Mobility: Walking and moving around Mobility: Walking and Moving Around Current Status ([id number]): At least 40 percent but less than 60 percent impaired, limited  or restricted Mobility: Walking and Moving Around Goal Status [id number]): At least 20 percent but less than 40 percent impaired, limited or restricted    [employer], PT 12/02/2016, 12:08 PM

## 2016-12-03 DIAGNOSIS — I10 Essential (primary) hypertension: Secondary | ICD-10-CM | POA: Diagnosis not present

## 2016-12-03 DIAGNOSIS — M1990 Unspecified osteoarthritis, unspecified site: Secondary | ICD-10-CM | POA: Diagnosis not present

## 2016-12-03 DIAGNOSIS — Q666 Other congenital valgus deformities of feet: Secondary | ICD-10-CM | POA: Diagnosis not present

## 2016-12-03 DIAGNOSIS — Z888 Allergy status to other drugs, medicaments and biological substances status: Secondary | ICD-10-CM | POA: Diagnosis not present

## 2016-12-03 DIAGNOSIS — G4733 Obstructive sleep apnea (adult) (pediatric): Secondary | ICD-10-CM | POA: Diagnosis not present

## 2016-12-03 DIAGNOSIS — F329 Major depressive disorder, single episode, unspecified: Secondary | ICD-10-CM | POA: Diagnosis not present

## 2016-12-03 DIAGNOSIS — E669 Obesity, unspecified: Secondary | ICD-10-CM | POA: Diagnosis not present

## 2016-12-03 DIAGNOSIS — M797 Fibromyalgia: Secondary | ICD-10-CM | POA: Diagnosis not present

## 2016-12-03 NOTE — Progress Notes (Signed)
Daily Progress Note   Subjective  - 2 Days Post-Op  F/u POD #2.  Doing much better.  Worked with PT today.  Home health PT recommended.  Objective Vitals:   12/01/16 2357 12/02/16 0442 12/02/16 1442 12/02/16 1958  BP: 128/68 (!) 146/73 (!) 160/84 (!) 166/83  Pulse: 95 92 93 100  Resp: Temp: 97.7 F (36.5 C) 98.6 F (37 C) 98.2 F (36.8 C) 98.8 F (37.1 C)  TempSrc: Oral Oral Oral Oral  SpO2: 96%  92% 94%  Weight:      Height:        Physical Exam: Dressing intact.  Godd sensation to digits.  Good CFT  Laboratory CBC    Component Value Date/Time   WBC 14.1 (H) 12/01/2016 1533   HGB 11.3 (L) 12/01/2016 1533   HGB 12.6 06/18/2015 1122   HCT 35.2 12/01/2016 1533   HCT 38.8 06/18/2015 1122   PLT 382 12/01/2016 1533   PLT 343 06/18/2015 1122    BMET    Component Value Date/Time   NA 142 11/29/2016 0948   NA 141 06/18/2015 1122   NA 138 11/01/2012 1833   K 4.3 11/29/2016 0948   K 4.0 11/01/2012 1833   CL 106 11/29/2016 0948   CL 104 11/01/2012 1833   CO2 31 11/29/2016 0948   CO2 27 11/01/2012 1833   GLUCOSE 83 11/29/2016 0948   GLUCOSE 92 11/01/2012 1833   BUN 12 11/29/2016 0948   BUN 11 06/18/2015 1122   BUN 7 11/01/2012 1833   CREATININE 0.60 12/01/2016 1533   CREATININE 0.60 11/29/2016 0948   CALCIUM 9.2 11/29/2016 0948   CALCIUM 10.2 (H) 11/01/2012 1833   GFRNONAA >60 12/01/2016 1533   GFRNONAA 109 11/29/2016 0948   GFRAA >60 12/01/2016 1533   GFRAA 126 11/29/2016 0948    Assessment/Planning: S/p right foot triple arthrodesis   Ok to d/c home today with home health.  NWB right foot.  Keep elevated.  Daily aspirin  Rx for Percocet 7.5/325 provided.  F/U scheduled for Thursday.    [name] A  12/03/2016, 9:57 AM

## 2016-12-03 NOTE — Progress Notes (Signed)
Physical Therapy Treatment Patient Details Name: Kathleen Conley MRN: [id number] DOB: [birth year] Today's Date: 12/03/2016    History of Present Illness 1. 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 Equinus deformity right lower leg; s/p :1. Triple arthrodesis right lower extremity 2. Percutaneous tendo Achilles lengthening right leg    PT Comments    Pt in bed, anticipating discharge home today.  Discussed mobility barriers.  Pt stated she continues to fatigue quickly with NWB RW and is limited in distance.  Initially she stated she planned to use RW to get from car to stairs but after discussion encouraged her to use knee walker.  She was able to transition to knee walker with min a x 2 for safety.  Once up, she was able to ambulate 120' in hallways with good control with emphasis on turns.  While they still are challenging for her, she is able to do them without assist going forward and backwards to eventually turn walker around.  Upon return to room, she used commode to void.  Transitions still remain challenging due to anxiety/fear but does well with +1-2 assist for safety.  Pt has 2 sons that will assist her into home and up/down stairs in a seated position.    Pt will benefit from a 3in 1 commode upon discharge along with knee walker.   Follow Up Recommendations  Home health PT     Equipment Recommendations  3in1 (PT)    Recommendations for Other Services       Precautions / Restrictions Precautions Precautions: Fall Required Braces or Orthoses: Other Brace/Splint Other Brace/Splint: soft splint R ankle Restrictions Weight Bearing Restrictions: Yes RLE Weight Bearing: Non weight bearing Other Position/Activity Restrictions: elevate    Mobility  Bed Mobility Overal bed mobility: Modified Independent             General bed mobility comments: Supine<>sit wiht Mod I.  Transfers Overall transfer level: Needs assistance Equipment used: Rolling walker (2  wheeled) Transfers: Sit to/from Stand Sit to Stand: Min assist;+2 safety/equipment         General transfer comment: to knee walker  Ambulation/Gait Ambulation/Gait assistance: Min guard Ambulation Distance (Feet): 120 Feet   Gait Pattern/deviations: Step-to pattern   Gait velocity interpretation: <1.8 ft/sec, indicative of risk for recurrent falls General Gait Details: knee walker used this session for gait training   Stairs         General stair comments: verbal review of seated stair management  Wheelchair Mobility    Modified Rankin (Stroke Patients Only)       Balance Overall balance assessment: Needs assistance Sitting-balance support: Bilateral upper extremity supported;Single extremity supported Sitting balance-Leahy Scale: Good Sitting balance - Comments: Good, maintains midline with no lean   Standing balance support: Bilateral upper extremity supported;During functional activity Standing balance-Leahy Scale: Fair Standing balance comment: Does well with knee walker                            Cognition Arousal/Alertness: Awake/alert Behavior During Therapy: WFL for tasks assessed/performed;Anxious Overall Cognitive Status: Within Functional Limits for tasks assessed                                 General Comments: hesitant with transitions and transfers      Exercises      General Comments        Pertinent Vitals/Pain Pain  Assessment: 0-10 Pain Score: 5  Pain Location: R ankle Pain Descriptors / Indicators: Aching;Throbbing Pain Intervention(s): Limited activity within patient's tolerance    Home Living                      Prior Function            PT Goals (current goals can now be found in the care plan section) Progress towards PT goals: Progressing toward goals    Frequency    BID      PT Plan Current plan remains appropriate    Co-evaluation              AM-PAC PT "6  Clicks" Daily Activity  Outcome Measure  Difficulty turning over in bed (including adjusting bedclothes, sheets and blankets)?: None Difficulty moving from lying on back to sitting on the side of the bed? : None   Help needed moving to and from a bed to chair (including a wheelchair)?: A Little Help needed walking in hospital room?: A Lot Help needed climbing 3-5 steps with a railing? : A Lot 6 Click Score: 15    End of Session Equipment Utilized During Treatment: Gait belt Activity Tolerance: Patient tolerated treatment well Patient left: in bed;with bed alarm set;with call bell/phone within reach   Pain - Right/Left: Right Pain - part of body: Ankle and joints of foot     Time: 4098-1191 PT Time Calculation (min) (ACUTE ONLY): 30 min  Charges:  $Gait Training: 8-22 mins $Therapeutic Activity: 8-22 mins                    G Codes:       Danielle Dess, PTA 12/03/16, 8:56 AM

## 2016-12-04 DIAGNOSIS — K5904 Chronic idiopathic constipation: Secondary | ICD-10-CM | POA: Diagnosis not present

## 2016-12-04 DIAGNOSIS — I10 Essential (primary) hypertension: Secondary | ICD-10-CM | POA: Diagnosis not present

## 2016-12-04 DIAGNOSIS — R011 Cardiac murmur, unspecified: Secondary | ICD-10-CM | POA: Diagnosis not present

## 2016-12-04 DIAGNOSIS — M2141 Flat foot [pes planus] (acquired), right foot: Secondary | ICD-10-CM | POA: Diagnosis not present

## 2016-12-04 DIAGNOSIS — G4733 Obstructive sleep apnea (adult) (pediatric): Secondary | ICD-10-CM | POA: Diagnosis not present

## 2016-12-04 DIAGNOSIS — Z4789 Encounter for other orthopedic aftercare: Secondary | ICD-10-CM | POA: Diagnosis not present

## 2016-12-04 DIAGNOSIS — M797 Fibromyalgia: Secondary | ICD-10-CM | POA: Diagnosis not present

## 2016-12-04 DIAGNOSIS — M21961 Unspecified acquired deformity of right lower leg: Secondary | ICD-10-CM | POA: Diagnosis not present

## 2016-12-04 DIAGNOSIS — D509 Iron deficiency anemia, unspecified: Secondary | ICD-10-CM | POA: Diagnosis not present

## 2016-12-05 LAB — SURGICAL PATHOLOGY

## 2016-12-06 DIAGNOSIS — G4733 Obstructive sleep apnea (adult) (pediatric): Secondary | ICD-10-CM | POA: Diagnosis not present

## 2016-12-06 DIAGNOSIS — Z4789 Encounter for other orthopedic aftercare: Secondary | ICD-10-CM | POA: Diagnosis not present

## 2016-12-06 DIAGNOSIS — R011 Cardiac murmur, unspecified: Secondary | ICD-10-CM | POA: Diagnosis not present

## 2016-12-06 DIAGNOSIS — K5904 Chronic idiopathic constipation: Secondary | ICD-10-CM | POA: Diagnosis not present

## 2016-12-06 DIAGNOSIS — M2141 Flat foot [pes planus] (acquired), right foot: Secondary | ICD-10-CM | POA: Diagnosis not present

## 2016-12-06 DIAGNOSIS — M21961 Unspecified acquired deformity of right lower leg: Secondary | ICD-10-CM | POA: Diagnosis not present

## 2016-12-06 DIAGNOSIS — M797 Fibromyalgia: Secondary | ICD-10-CM | POA: Diagnosis not present

## 2016-12-06 DIAGNOSIS — D509 Iron deficiency anemia, unspecified: Secondary | ICD-10-CM | POA: Diagnosis not present

## 2016-12-06 DIAGNOSIS — I10 Essential (primary) hypertension: Secondary | ICD-10-CM | POA: Diagnosis not present

## 2016-12-07 DIAGNOSIS — M79671 Pain in right foot: Secondary | ICD-10-CM | POA: Diagnosis not present

## 2016-12-07 DIAGNOSIS — M76829 Posterior tibial tendinitis, unspecified leg: Secondary | ICD-10-CM | POA: Diagnosis not present

## 2016-12-21 DIAGNOSIS — M79671 Pain in right foot: Secondary | ICD-10-CM | POA: Diagnosis not present

## 2016-12-21 DIAGNOSIS — M76829 Posterior tibial tendinitis, unspecified leg: Secondary | ICD-10-CM | POA: Diagnosis not present

## 2017-01-09 DIAGNOSIS — M79671 Pain in right foot: Secondary | ICD-10-CM | POA: Diagnosis not present

## 2017-01-09 DIAGNOSIS — M76829 Posterior tibial tendinitis, unspecified leg: Secondary | ICD-10-CM | POA: Diagnosis not present

## 2017-01-23 DIAGNOSIS — M79671 Pain in right foot: Secondary | ICD-10-CM | POA: Diagnosis not present

## 2017-01-31 ENCOUNTER — Ambulatory Visit: Payer: 59 | Admitting: Physical Therapy

## 2017-02-06 ENCOUNTER — Encounter: Payer: 59 | Admitting: Physical Therapy

## 2017-02-06 ENCOUNTER — Encounter: Payer: Self-pay | Admitting: Physical Therapy

## 2017-02-06 ENCOUNTER — Other Ambulatory Visit: Payer: Self-pay

## 2017-02-06 ENCOUNTER — Ambulatory Visit: Payer: 59 | Attending: Podiatry | Admitting: Physical Therapy

## 2017-02-06 DIAGNOSIS — R2681 Unsteadiness on feet: Secondary | ICD-10-CM | POA: Insufficient documentation

## 2017-02-06 DIAGNOSIS — R2689 Other abnormalities of gait and mobility: Secondary | ICD-10-CM | POA: Insufficient documentation

## 2017-02-06 DIAGNOSIS — M25571 Pain in right ankle and joints of right foot: Secondary | ICD-10-CM | POA: Insufficient documentation

## 2017-02-06 DIAGNOSIS — M6281 Muscle weakness (generalized): Secondary | ICD-10-CM | POA: Diagnosis not present

## 2017-02-06 NOTE — Therapy (Signed)
[place] [facility] [address] [place], [place], [address] Phone: [phone]   Fax:  [phone]  Physical Therapy Evaluation  Patient Details  Name: Kathleen Conley MRN: [id number] Date of Birth: Sep 20, 1970 Referring Provider: [name]   Encounter Date: 02/06/2017  PT End of Session - 02/06/17 1214    Visit Number  1    Number of Visits  13    Date for PT Re-Evaluation  03/20/17    PT Start Time  1101    PT Stop Time  1204    PT Time Calculation (min)  63 min    Equipment Utilized During Treatment  Other (comment) R boot, crutches    Activity Tolerance  Patient tolerated treatment well    Behavior During Therapy  [facility] for tasks assessed/performed       Past Medical History:  Diagnosis Date  . 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 Allergy   . Anemia    Iron Deficiency  . Arthritis   . Baker cyst   . Breast mass, right 01/18/2010  . Cardiac murmur   . Chronic constipation   . Depression   . Dysmetabolic syndrome   . Fibromyalgia   . Hairiness   . Hydradenitis   . Hypertension   . Incarcerated ventral hernia 09/29/2011  . Obesity   . Obstructive sleep apnea    no CPAP  . Plantar fasciitis   . PVC (premature ventricular contraction)     Past Surgical History:  Procedure Laterality Date  . FOOT ARTHRODESIS Right 12/01/2016   Procedure: ARTHRODESIS FOOT; TRIPLE;  Surgeon: Gwyneth RevelsFowler, Justin, DPM;  Location: [facility] ORS;  Service: Podiatry;  Laterality: Right;  . HERNIA REPAIR  10/09/11   Ventral Incarcerated- [name]  . OOPHORECTOMY Left 12/09/2009  . TUBAL LIGATION  1993  . VENTRAL HERNIA REPAIR  10/09/2011    There were no vitals filed for this visit.   Subjective Assessment - 02/06/17 1115    Subjective  Pt is s/p reconstruction of R foot and ankle as documented below.     Pertinent History  Pt is a 46 y/o F s/p reconstruction of R foot and ankle. 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 On 12/01/16 pt underwent triple arthrodesis RLE, percutaneous teno achilles lengthening RLE. Pt with soft splint R ankle following surgery and was NWB. Pt using knee walker in hospital due to poor balance with RW. Pt had appointment with [name] on 12/4 who instructed pt to begin [facility] starting with 25% and increasing thereafter. Pt reports she was instructed to WBAT with RW or crutches. Pt used to walk 1-2 miles prior to surgery for exercise before pain became too severe.  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  Pt has been off work since end of September 2018, [name] suspects potentially going back to work mid February depending on progress with PT.  Pt uses crutches in community and RW in her apartment.  Easing factors: sitting down/lying down, tylenol.  Aggravating factors: WBing activities.  Pt has not been using ice or heat.  Pt's therapy goals: return to work, walking for exercise to lose weight, be able to move more quickly with ADLs and IADLs.  Pt independent with dressing, showering, driving.  MD gave permission to pt to return to driving with tennis shoe.  Pt  reports mild pain when driving when transitioning from brake to gas pedal.   Pt denies any falls since the surgery.  Pt denies any change in weight, night sweats.  Pt able to ambulate up to 50 yards max before needing to sit due to significant pain. Pt is anticipating having same surgery on L foot in the future. Pt has custom orthotic in L shoe.     Limitations  Walking;House hold activities;Lifting;Standing    How long can you sit comfortably?  Unlimited    How long can you stand comfortably?  5 minutes    How long can you walk comfortably?  ~50 yards before significant pain    Diagnostic tests  X-ray post op, see chart     Patient Stated Goals  return to work, return to walking for exercise, be able to complete her  ADLs and IADLs more quickly.    Currently in Pain?  Yes    Pain Score  6     Pain Location  Ankle    Pain Orientation  Right    Pain Descriptors / Indicators  Aching    Pain Type  Surgical pain    Pain Onset  More than a month ago    Pain Frequency  Constant    Aggravating Factors   WBing activities    Pain Relieving Factors  Tylenol, sitting, lying down    Effect of Pain on Daily Activities  Limited ambulatory distance, slower with ADLs and IADLs, not able to work    Multiple Pain Sites  No         [facility] PT Assessment - 02/06/17 1117      Assessment   Medical Diagnosis  s/p reconstruction R foot and ankle    Referring Provider  [name]    Onset Date/Surgical Date  12/01/16    Hand Dominance  Right    Next MD Visit  January 15th    Prior Therapy  No      Precautions   Precautions  Fall    Required Braces or Orthoses  Other Brace/Splint    Other Brace/Splint  R boot when in WBing positions      Restrictions   Weight Bearing Restrictions  Yes    RLE Weight Bearing  Partial weight bearing    RLE Partial Weight Bearing Percentage or Pounds  25%, transitioning to WBAT      Balance Screen   Has the patient fallen in the past 6 months  No    Has the patient had a decrease in activity level because of a fear of falling?   Yes    Is the patient reluctant to leave their home because of a fear of falling?   Yes      Home Environment   Living Environment  Private residence    Living Arrangements  Children 2 sons    Available Help at Discharge  Family    Type of Home  Apartment    Home Access  Stairs to enter    Entrance Stairs-Number of Steps  14    Entrance Stairs-Rails  Left;Right    Home Layout  One level    Home Equipment  Walker - 2 wheels;Crutches      Prior Function   Level of Independence  Independent with household mobility with device;Independent with basic ADLs    Vocation  Full time employment    Vocation Requirements  LPN    Leisure  Movies, spending  time with family  Cognition   Overall Cognitive Status  Within Functional Limits for tasks assessed      ROM / Strength   AROM / PROM / Strength  Strength      Strength   Overall Strength  Deficits    Strength Assessment Site  Hip;Knee    Right/Left Hip  Right;Left    Right Hip Flexion  5/5    Right Hip External Rotation   5/5    Right Hip Internal Rotation  4/5    Right Hip ABduction  3-/5    Right Hip ADduction  5/5    Left Hip Flexion  5/5    Left Hip External Rotation  5/5    Left Hip Internal Rotation  5/5    Left Hip ABduction  3-/5    Left Hip ADduction  5/5    Right/Left Knee  Right;Left    Right Knee Flexion  5/5    Right Knee Extension  5/5    Left Knee Flexion  5/5    Left Knee Extension  4/5 Mild pain ant knee-h/o ruptured baker's cyst      EXAMINATION   Outcome measures were completed and results explained to the patient:  LEFS: 29/80  : 0.32 m/s   Palpation: tender lateral R ankle and distal achilles   Sensation: WNL RLE   Observation: well healing scar along lateral ankle, anterior dorsal foot, achilles   Gait Analysis: 3 point gait pattern leading with crutches, then L foot, then R foot. Significantly flexed posture and heavy lean on crutches Bil.   Ankle AROM in deg (L, R):  DF: 12, 1 (pain in lateral ankle)  PF: 45, 15  Eversion: 13, 0  Inversion: 30, 0   Knee AROM in deg (L, R):  F: 126, 128  E: -3, 0    Objective measurements completed on examination: See above findings.     TREATMENT  Crutches adjusted up one notch. Ambulating in gym with crutches with cues for 3 point gait pattern leading with crutches, then R foot, then L foot. Pt demonstrated upright posture and reported decreased pain in R foot.   Encouraged pt to purchase ice pack for R foot/ankle to use after WBing activity and to ice her R foot and ankle when she gets home from session.   Instructed pt to always wear a shoe on LLE with an equal height as the boot to  prevent any hip or back pain in the future (pt had not been wearing L shoe at home with boot on)   Retrograde massage to R foot/ankle for decreased swelling (9 minutes)   AROM R ankle x10 each direction: DF, PF, Inversion, Eversion (added to HEP). Specific instructions to not push past mild pain and specifically with DF AROM she is not to push past mild stretch to protect achilles.            PT Education - 02/06/17 1213    Education provided  Yes    Education Details  POC, role of PT, examination findings, encouraged pt to perform self massage to R foot/ankle, encouraged pt to invest in ice pack for R foot/ankle and to begin icing after activity/3x/day, gait training, HEP initiated and handout provided    Person(s) Educated  Patient    Methods  Explanation;Demonstration;Verbal cues;Handout;Tactile cues    Comprehension  Verbalized understanding;Returned demonstration;Verbal cues required;Tactile cues required;Need further instruction       PT Short Term Goals - 02/06/17 1251  PT SHORT TERM GOAL #1   Title  Pt will be independent with HEP for improved carryover between sessions    Time  2    Period  Weeks    Status  New        PT Long Term Goals - 02/06/17 1251      PT LONG TERM GOAL #1   Title  Pt's LEFS score will improve to at least 50/80 to demonstrate improved functional use of RLE    Baseline  29/80    Time  6    Period  Weeks    Status  New      PT LONG TERM GOAL #2   Title  Pt will be able to ambulate with minimal>no gait abnormalities to return to walking for exercise    Baseline  See Evaluation    Time  6    Period  Weeks    Status  New      PT LONG TERM GOAL #3   Title  Pt will be able to stand for at least 30 minutes without pain for return to work    Baseline  5 minutes    Time  5    Period  Weeks    Status  New      PT LONG TERM GOAL #4   Title  Pt's R ankle AROM will improve to [facility] for improved functional use of RLE    Baseline  See  Evaluation    Time  5    Period  Weeks    Status  New      PT LONG TERM GOAL #5   Title  Pt's will improve to at least 1.0 m/s to return to community ambulation    Baseline  0.32 m/s    Time  4    Period  Weeks    Status  New             Plan - 02/06/17 1215    Clinical Impression Statement  Pt is a 46 y/o F who presents s/p surgery documented above with limitations in R foot and ankle ROM.  Pt with significant swelling R foot and anke which was addressed with AROM exercises and manual massage therapy.  Pt additionally encouraged to purchase ice pack for R foot/ankle to decrease swelling.  Pt presents with impaired gait mechanics which improved with adjustment to crutches and verbal cues with demonstration for proper technique and posture.  Pt demonstrates decreased gait speed as evidence by her . Pt with impaired functional use of RLE as evidenced by LEFS.  Significant glute weakness BLE.  Pt will benefit from skilled PT for improved ROM and strength, gait mechanics, decreased swelling, for improved QOL and return to work.     History and Personal Factors relevant to plan of care:  (+) pt very motivated to return to work and active lifestyle (-) LLE with same presentation as R foot/ankle before surgery, pt overweight    Clinical Presentation  Stable    Clinical Presentation due to:  Pt progressing as expected s/p surgery documented above without comorbidities or complications    Clinical Decision Making  Low    Rehab Potential  Good    PT Frequency  2x / week    PT Duration  6 weeks    PT Treatment/Interventions  ADLs/Self Care Home Management;Aquatic Therapy;Biofeedback;Cryotherapy;Iontophoresis 4mg /ml Dexamethasone;Electrical Stimulation;Moist Heat;Traction;Ultrasound;Parrafin;Fluidtherapy;Contrast Bath;DME Instruction;Gait training;Stair training;Functional mobility training;Therapeutic activities;Therapeutic exercise;Balance training;Neuromuscular  re-education;Patient/family education;Orthotic Fit/Training;Manual techniques;Compression bandaging;Scar mobilization;Passive  range of motion;Dry needling;Energy conservation;Taping    PT Next Visit Plan  Progress therapeutic exercise as appropriate; stair assessment; review gait mechanics; initiate manual therapy for improved DF/PF    PT Home Exercise Plan  R ankle AROM DF/PF/Inv/Ev, icing, ambulating with proper gait mechanics as documented above, massage R foot/ankle for decreased swelling    Recommended Other Services  None at this time    Consulted and Agree with Plan of Care  Patient       Patient will benefit from skilled therapeutic intervention in order to improve the following deficits and impairments:  Abnormal gait, Decreased activity tolerance, Decreased balance, Decreased endurance, Decreased knowledge of precautions, Decreased knowledge of use of DME, Decreased mobility, Decreased range of motion, Decreased safety awareness, Decreased scar mobility, Decreased strength, Difficulty walking, Hypomobility, Increased edema, Increased fascial restricitons, Increased muscle spasms, Impaired perceived functional ability, Impaired flexibility, Improper body mechanics, Postural dysfunction, Obesity, Pain  Visit Diagnosis: Muscle weakness (generalized)  Pain in right ankle and joints of right foot  Unsteadiness on feet  Other abnormalities of gait and mobility     Problem List Patient Active Problem List   Diagnosis Date Noted  . Post-op pain 12/01/2016  . 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 Flat foot 01/28/2016  . Aortic sclerosis 10/14/2015  . Midline low back pain without sciatica 09/20/2015  . Leukocytosis 11/29/2014  . Osteoarthritis of left knee 11/20/2014  . History of ventral hernia repair 10/27/2014  . Allergic rhinitis 08/17/2014  . Axillary hidradenitis suppurativa 08/17/2014  . Baker cyst 08/17/2014  . Chronic constipation 08/17/2014  . Depression,  major, recurrent, mild (HCC) 08/17/2014  . Engages in travel abroad 08/17/2014  . Fibromyalgia 08/17/2014  . Cardiac murmur 08/17/2014  . Anemia, iron deficiency 08/17/2014  . Excessive and frequent menstruation 08/17/2014  . Dysmetabolic syndrome 08/17/2014  . Plantar fasciitis 08/17/2014  . Beat, premature ventricular 08/17/2014  . 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 Obstructive apnea 12/02/2013  . Essential (primary) hypertension 12/09/2009  . Hypertrichosis 12/09/2009  . Extreme obesity 12/09/2009    Encarnacion ChuAshley Keatyn Jawad PT, DPT 02/06/2017, 12:56 PM  [place] [facility] [address] [place], [place], [address] Phone: [phone]   Fax:  [phone]  Name: Kathleen Conley MRN: [id number] Date of Birth: 09-07-1970

## 2017-02-08 ENCOUNTER — Ambulatory Visit: Payer: 59 | Admitting: Physical Therapy

## 2017-02-08 ENCOUNTER — Encounter: Payer: Self-pay | Admitting: Physical Therapy

## 2017-02-08 ENCOUNTER — Other Ambulatory Visit: Payer: Self-pay

## 2017-02-08 DIAGNOSIS — R2689 Other abnormalities of gait and mobility: Secondary | ICD-10-CM | POA: Diagnosis not present

## 2017-02-08 DIAGNOSIS — R2681 Unsteadiness on feet: Secondary | ICD-10-CM

## 2017-02-08 DIAGNOSIS — M25571 Pain in right ankle and joints of right foot: Secondary | ICD-10-CM | POA: Diagnosis not present

## 2017-02-08 DIAGNOSIS — M6281 Muscle weakness (generalized): Secondary | ICD-10-CM | POA: Diagnosis not present

## 2017-02-08 NOTE — Therapy (Signed)
[facility] [address] [place], [place], [address] Phone: [phone]   Fax:  [phone]  Physical Therapy Treatment  Patient Details  Name: Kathleen Conley MRN: [id number] Date of Birth: 02-27-1970 Referring Provider: [name]   Encounter Date: 02/08/2017  PT End of Session - 02/08/17 0829    Visit Number  2    Number of Visits  13    Date for PT Re-Evaluation  03/20/17    PT Start Time  0829    PT Stop Time  0914    PT Time Calculation (min)  45 min    Equipment Utilized During Treatment  Other (comment) R boot, crutches    Activity Tolerance  Patient tolerated treatment well    Behavior During Therapy  [facility] for tasks assessed/performed       Past Medical History:  Diagnosis Date  . 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 Allergy   . Anemia    Iron Deficiency  . Arthritis   . Baker cyst   . Breast mass, right 01/18/2010  . Cardiac murmur   . Chronic constipation   . Depression   . Dysmetabolic syndrome   . Fibromyalgia   . Hairiness   . Hydradenitis   . Hypertension   . Incarcerated ventral hernia 09/29/2011  . Obesity   . Obstructive sleep apnea    no CPAP  . Plantar fasciitis   . PVC (premature ventricular contraction)     Past Surgical History:  Procedure Laterality Date  . FOOT ARTHRODESIS Right 12/01/2016   Procedure: ARTHRODESIS FOOT; TRIPLE;  Surgeon: Gwyneth RevelsFowler, Justin, DPM;  Location: [facility] ORS;  Service: Podiatry;  Laterality: Right;  . HERNIA REPAIR  10/09/11   Ventral Incarcerated- [name]  . OOPHORECTOMY Left 12/09/2009  . TUBAL LIGATION  1993  . VENTRAL HERNIA REPAIR  10/09/2011    There were no vitals filed for this visit.  Subjective Assessment - 02/08/17 0836    Subjective  Pt has ordered a elasto gel ice pack for R ankle/foot which should be arriving within the next week.  She is doing well.  Has been completing her HEP without  any questions.      Pertinent History  Pt is a 46 y/o F s/p reconstruction of R foot and ankle. 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 On 12/01/16 pt underwent triple arthrodesis RLE, percutaneous teno achilles lengthening RLE. Pt with soft splint R ankle following surgery and was NWB. Pt using knee walker in hospital due to poor balance with RW. Pt had appointment with [name] on 12/4 who instructed pt to [facility] starting with 25% and increasing thereafter. Pt reports she was instructed to WBAT with RW or crutches. Pt used to walk 1-2 miles prior to surgery for exercise before pain became too severe.  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  Pt has been off work since end of September 2018, [name] suspects potentially going back to work mid February depending on progress with PT.  Pt uses crutches in community and RW in her apartment.  Easing factors: sitting down/lying down, tylenol.  Aggravating factors: WBing activities.  Pt has not been using ice or heat.  Pt's therapy goals: return to work, walking for exercise to lose weight, be able to move more quickly with ADLs and IADLs.  Pt independent with dressing, showering, driving.  MD gave permission to pt to return to driving with tennis shoe.  Pt reports mild pain when driving when transitioning from brake to gas pedal.   Pt denies any falls since the surgery.  Pt denies any change in weight, night sweats.  Pt able to ambulate up to 50 yards max before needing to sit due to significant pain. Pt is anticipating having same surgery on L foot in the future. Pt has custom orthotic in L shoe.     Limitations  Walking;House hold activities;Lifting;Standing    How long can you sit comfortably?  Unlimited    How long can you stand comfortably?  5 minutes    How long can you walk comfortably?  ~50 yards before significant pain    Diagnostic tests  X-ray post op, see  chart     Patient Stated Goals  return to work, return to walking for exercise, be able to complete her ADLs and IADLs more quickly.    Currently in Pain?  Yes    Pain Score  4     Pain Location  Ankle    Pain Orientation  Right    Pain Descriptors / Indicators  Aching    Pain Onset  More than a month ago    Multiple Pain Sites  No       TREATMENT   Stair assessment: Pt leads with R foot during ascent and leads with L foot during descent. Pt instructed to reverse this and pt performed in clinic correctly with instruction for carryover at home and in community.   Ambulating in gym with crutches with cues for 3 point gait pattern leading with min verbal cues for upright posture. Pt with proper sequencing with crutches and upright posture.   PROM R ankle into eversion/inversion/DF/PF in painfree range x20 each direction   AROM R ankle into eversion/inversion/DF/PF x20 each direction   R foot subtalar AP and PA joint mobilizations grade . 5x30 second bouts each direction   Retrograde massage to R foot/ankle for decreased swelling   Isometrics in inversion/eversion/DF/PF with cues to not push past mild pain. 5x10 seconds each direction (added to HEP)   Ice to R ankle/foot following session (unbilled)           PT Education - 02/08/17 0829    Education provided  Yes    Education Details  Exercise technique; isometrics added to HEP and handout provided    Person(s) Educated  Patient    Methods  Explanation;Demonstration;Verbal cues;Tactile cues    Comprehension  Verbalized understanding;Returned demonstration;Verbal cues required;Tactile cues required;Need further instruction       PT Short Term Goals - 02/06/17 1251      PT SHORT TERM GOAL #1   Title  Pt will be independent with HEP for improved carryover between sessions    Time  2    Period  Weeks    Status  New        PT Long Term Goals - 02/06/17 1251      PT LONG TERM GOAL #1   Title  Pt's LEFS score will  improve to at least 50/80 to demonstrate improved functional use of RLE    Baseline  29/80    Time  6    Period  Weeks    Status  New      PT LONG TERM GOAL #2   Title  Pt will be able to ambulate with minimal>no gait  abnormalities to return to walking for exercise    Baseline  See Evaluation    Time  6    Period  Weeks    Status  New      PT LONG TERM GOAL #3   Title  Pt will be able to stand for at least 30 minutes without pain for return to work    Baseline  5 minutes    Time  5    Period  Weeks    Status  New      PT LONG TERM GOAL #4   Title  Pt's R ankle AROM will improve to [facility] for improved functional use of RLE    Baseline  See Evaluation    Time  5    Period  Weeks    Status  New      PT LONG TERM GOAL #5   Title  Pt's will improve to at least 1.0 m/s to return to community ambulation    Baseline  0.32 m/s    Time  4    Period  Weeks    Status  New            Plan - 02/08/17 1610    Clinical Impression Statement  Pt with greatest deficits in inversion and eversion which improved slightly with PROM and AROM exercises and joint mobilizations.  Introduced isometric exercises for pain control and strengthening which pt performed well, added to HEP and provided pt with handout.  Stair assessment completed and pt instructed in proper technique.  Pt will benefit from continued skilled PT interventions for improved ROM, gait mechanics, decreased pain, and improved QOL.     Rehab Potential  Good    PT Frequency  2x / week    PT Duration  6 weeks    PT Treatment/Interventions  ADLs/Self Care Home Management;Aquatic Therapy;Biofeedback;Cryotherapy;Iontophoresis 4mg /ml Dexamethasone;Electrical Stimulation;Moist Heat;Traction;Ultrasound;Parrafin;Fluidtherapy;Contrast Bath;DME Instruction;Gait training;Stair training;Functional mobility training;Therapeutic activities;Therapeutic exercise;Balance training;Neuromuscular re-education;Patient/family education;Orthotic  Fit/Training;Manual techniques;Compression bandaging;Scar mobilization;Passive range of motion;Dry needling;Energy conservation;Taping    PT Next Visit Plan  Progress therapeutic exercise as appropriate; stair assessment; review gait mechanics; initiate manual therapy for improved DF/PF    PT Home Exercise Plan  R ankle AROM DF/PF/Inv/Ev, icing, ambulating with proper gait mechanics as documented above, massage R foot/ankle for decreased swelling    Consulted and Agree with Plan of Care  Patient       Patient will benefit from skilled therapeutic intervention in order to improve the following deficits and impairments:  Abnormal gait, Decreased activity tolerance, Decreased balance, Decreased endurance, Decreased knowledge of precautions, Decreased knowledge of use of DME, Decreased mobility, Decreased range of motion, Decreased safety awareness, Decreased scar mobility, Decreased strength, Difficulty walking, Hypomobility, Increased edema, Increased fascial restricitons, Increased muscle spasms, Impaired perceived functional ability, Impaired flexibility, Improper body mechanics, Postural dysfunction, Obesity, Pain  Visit Diagnosis: Muscle weakness (generalized)  Pain in right ankle and joints of right foot  Unsteadiness on feet  Other abnormalities of gait and mobility     Problem List Patient Active Problem List   Diagnosis Date Noted  . Post-op pain 12/01/2016  . 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 Flat foot 01/28/2016  . Aortic sclerosis 10/14/2015  . Midline low back pain without sciatica 09/20/2015  . Leukocytosis 11/29/2014  . Osteoarthritis of left knee 11/20/2014  . History of ventral hernia repair 10/27/2014  . Allergic rhinitis 08/17/2014  . Axillary hidradenitis suppurativa 08/17/2014  . Baker cyst 08/17/2014  .  Chronic constipation 08/17/2014  . 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 Engages in travel abroad 08/17/2014  . Fibromyalgia  08/17/2014  . Cardiac murmur 08/17/2014  . Anemia, iron deficiency 08/17/2014  . Excessive and frequent menstruation 08/17/2014  . Dysmetabolic syndrome 08/17/2014  . Plantar fasciitis 08/17/2014  . Beat, premature ventricular 08/17/2014  . 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 Obstructive apnea 12/02/2013  . Essential (primary) hypertension 12/09/2009  . Hypertrichosis 12/09/2009  . Extreme obesity 12/09/2009    Encarnacion ChuAshley Lashawnna Lambrecht PT, DPT 02/08/2017, 9:15 AM  [facility] [address] [place], [place], [address] Phone: [phone]   Fax:  [phone]  Name: Kathleen Conley MRN: [id number] Date of Birth: 11/06/1970

## 2017-02-14 ENCOUNTER — Ambulatory Visit: Payer: 59 | Admitting: Physical Therapy

## 2017-02-14 DIAGNOSIS — M6281 Muscle weakness (generalized): Secondary | ICD-10-CM | POA: Diagnosis not present

## 2017-02-14 DIAGNOSIS — R2689 Other abnormalities of gait and mobility: Secondary | ICD-10-CM

## 2017-02-14 DIAGNOSIS — M25571 Pain in right ankle and joints of right foot: Secondary | ICD-10-CM

## 2017-02-14 DIAGNOSIS — R2681 Unsteadiness on feet: Secondary | ICD-10-CM | POA: Diagnosis not present

## 2017-02-14 NOTE — Therapy (Signed)
[facility] [address], [place], [address] Phone: [phone]   Fax:  [phone]  Physical Therapy Treatment  Patient Details  Name: Joniah Bednarski MRN: [id number] Date of Birth: 08-28-1970 Referring Provider: [name]   Encounter Date: 02/14/2017  PT End of Session - 02/14/17 1433    Visit Number  3    Number of Visits  13    Date for PT Re-Evaluation  03/20/17    PT Start Time  1351    PT Stop Time  1430    PT Time Calculation (min)  39 min    Equipment Utilized During Treatment  Other (comment) R boot, crutches    Activity Tolerance  Patient tolerated treatment well    Behavior During Therapy  [facility] for tasks assessed/performed       Past Medical History:  Diagnosis Date  . 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 Allergy   . Anemia    Iron Deficiency  . Arthritis   . Baker cyst   . Breast mass, right 01/18/2010  . Cardiac murmur   . Chronic constipation   . Depression   . Dysmetabolic syndrome   . Fibromyalgia   . Hairiness   . Hydradenitis   . Hypertension   . Incarcerated ventral hernia 09/29/2011  . Obesity   . Obstructive sleep apnea    no CPAP  . Plantar fasciitis   . PVC (premature ventricular contraction)     Past Surgical History:  Procedure Laterality Date  . FOOT ARTHRODESIS Right 12/01/2016   Procedure: ARTHRODESIS FOOT; TRIPLE;  Surgeon: [name], DPM;  Location: [facility] ORS;  Service: Podiatry;  Laterality: Right;  . HERNIA REPAIR  10/09/11   Ventral Incarcerated- Dr. [name]  . OOPHORECTOMY Left 12/09/2009  . TUBAL LIGATION  1993  . VENTRAL HERNIA REPAIR  10/09/2011    There were no vitals filed for this visit.  Subjective Assessment - 02/14/17 1354    Subjective  Patient reports her lateral ankle got quite sore last night from standing and cooking for a while for the holidays. She has been alternating between using her  crutches and a RW. Denies any recent falls.     Pertinent History  Pt is a 46 y/o F s/p reconstruction of R foot and ankle. 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 On 12/01/16 pt underwent triple arthrodesis RLE, percutaneous teno achilles lengthening RLE. Pt with soft splint R ankle following surgery and was NWB. Pt using knee walker in hospital due to poor balance with RW. Pt had appointment with Dr. Ether Griffins on 12/4 who instructed pt to begin [facility] starting with 25% and increasing thereafter. Pt reports she was instructed to WBAT with RW or crutches. Pt used to walk 1-2 miles prior to surgery for exercise before pain became too severe.  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  Pt has been off work since end of September 2018, Dr. Ether Griffins suspects potentially going back to work mid February depending on progress with PT.  Pt uses crutches in community and RW in her apartment.  Easing factors: sitting down/lying down, tylenol.  Aggravating factors: WBing activities.  Pt has not been using ice or heat.  Pt's therapy goals: return to work, walking for exercise to lose weight, be able to move more quickly with ADLs and IADLs.  Pt independent with dressing, showering, driving.  MD gave permission to pt to return to driving with tennis shoe.  Pt reports mild pain when driving when transitioning from brake to gas pedal.   Pt denies any falls since the surgery.  Pt denies any change in weight, night sweats.  Pt able to ambulate up to 50 yards max before needing to sit due to significant pain. Pt is anticipating having same surgery on L foot in the future. Pt has custom orthotic in L shoe.     Limitations  Walking;House hold activities;Lifting;Standing    How long can you sit comfortably?  Unlimited    How long can you stand comfortably?  5 minutes    How long can you walk comfortably?  ~50 yards before significant pain    Diagnostic  tests  X-ray post op, see chart     Patient Stated Goals  return to work, return to walking for exercise, be able to complete her ADLs and IADLs more quickly.    Currently in Pain?  Yes    Pain Score  4     Pain Location  Ankle    Pain Orientation  Right    Pain Descriptors / Indicators  Aching    Pain Type  Chronic pain;Surgical pain    Pain Onset  More than a month ago    Pain Frequency  Intermittent    Aggravating Factors   Increased or prolonged weightbearing       Observed gait mechanics with 2 crutches, noted to have step to pattern, patient reported only mild pain with ambulation. Cued and educated patient on how to complete step through pattern, and while some lateral weight shift was noted to L side, she was able to complete without increase in pain or significant loss of balance. Performed ~100' of ambulation with 1 crutch and step through pattern with cuing for proper technique/patterning.   Observed incision sites, all appear to be healing well, no drainage or signs of infection noted.   Performed grade IV++ mobilizations into inversion, eversion, DF, and PF x 5 bouts in each direction (2 separate times completing 5 bouts info inversion and DF given how limited the ROM was) each bout 30-45"   Great toe mobilizations on RLE (notable for significant loss of range of motion into extension) -- 4 bouts x 45" for grade IV++ mobilizations.   AROM against minimal manual resistance into DF, PF, eversion, inversion-- 3 sets x 12 repetitions in each direction on RLE.   Sit to stands with TRX and use of boot on RLE x 10 for 2 sets, progressed to 4x sit to stand without use of UEs, though notable for weight shifting onto her LLE.                        PT Education - 02/14/17 1433    Education provided  Yes    Education Details  Will progress HEP in follow up session, need to improve her ROM and strength for optimal recovery.     Person(s) Educated  Patient     Methods  Explanation;Demonstration;Verbal cues    Comprehension  Verbalized understanding;Returned demonstration;Verbal cues required       PT Short Term Goals - 02/06/17 1251      PT SHORT TERM GOAL #1   Title  Pt will be independent with HEP for improved carryover between sessions    Time  2    Period  Weeks  Status  New        PT Long Term Goals - 02/06/17 1251      PT LONG TERM GOAL #1   Title  Pt's LEFS score will improve to at least 50/80 to demonstrate improved functional use of RLE    Baseline  29/80    Time  6    Period  Weeks    Status  New      PT LONG TERM GOAL #2   Title  Pt will be able to ambulate with minimal>no gait abnormalities to return to walking for exercise    Baseline  See Evaluation    Time  6    Period  Weeks    Status  New      PT LONG TERM GOAL #3   Title  Pt will be able to stand for at least 30 minutes without pain for return to work    Baseline  5 minutes    Time  5    Period  Weeks    Status  New      PT LONG TERM GOAL #4   Title  Pt's R ankle AROM will improve to [facility] for improved functional use of RLE    Baseline  See Evaluation    Time  5    Period  Weeks    Status  New      PT LONG TERM GOAL #5   Title  Pt's will improve to at least 1.0 m/s to return to community ambulation    Baseline  0.32 m/s    Time  4    Period  Weeks    Status  New            Plan - 02/14/17 1434    Clinical Impression Statement  Patient continues to demonstrate severe ROM limitations in eversion and dorsiflexion with weakness in all cardinal directions/planes in her R ankle. She is able to improve gait pattern with cuing for step through pattern with 1 crutch, though she is still more stable with 2 crutches at this time. She is able to perform sit to stands without use of her UEs, indicative of good quadriceps strength, though she noted shaking/significant difficulty with her RLE still. She tolerated manual therapy well this date, and will  likely continue to benefit from manual therapy and exercise to improve her ROM and gait mechanics.     Clinical Presentation  Stable    Clinical Decision Making  Moderate    Rehab Potential  Good    PT Frequency  2x / week    PT Duration  6 weeks    PT Treatment/Interventions  ADLs/Self Care Home Management;Aquatic Therapy;Biofeedback;Cryotherapy;Iontophoresis 4mg /ml Dexamethasone;Electrical Stimulation;Moist Heat;Traction;Ultrasound;Parrafin;Fluidtherapy;Contrast Bath;DME Instruction;Gait training;Stair training;Functional mobility training;Therapeutic activities;Therapeutic exercise;Balance training;Neuromuscular re-education;Patient/family education;Orthotic Fit/Training;Manual techniques;Compression bandaging;Scar mobilization;Passive range of motion;Dry needling;Energy conservation;Taping    PT Next Visit Plan  Progress therapeutic exercise as appropriate; stair assessment; review gait mechanics; initiate manual therapy for improved DF/PF    PT Home Exercise Plan  R ankle AROM DF/PF/Inv/Ev, icing, ambulating with proper gait mechanics as documented above, massage R foot/ankle for decreased swelling    Consulted and Agree with Plan of Care  Patient       Patient will benefit from skilled therapeutic intervention in order to improve the following deficits and impairments:  Abnormal gait, Decreased activity tolerance, Decreased balance, Decreased endurance, Decreased knowledge of precautions, Decreased knowledge of use of DME, Decreased mobility, Decreased range of motion, Decreased safety  awareness, Decreased scar mobility, Decreased strength, Difficulty walking, Hypomobility, Increased edema, Increased fascial restricitons, Increased muscle spasms, Impaired perceived functional ability, Impaired flexibility, Improper body mechanics, Postural dysfunction, Obesity, Pain  Visit Diagnosis: Muscle weakness (generalized)  Pain in right ankle and joints of right foot  Unsteadiness on feet  Other  abnormalities of gait and mobility     Problem List Patient Active Problem List   Diagnosis Date Noted  . Post-op pain 12/01/2016  . 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 Flat foot 01/28/2016  . Aortic sclerosis 10/14/2015  . Midline low back pain without sciatica 09/20/2015  . Leukocytosis 11/29/2014  . Osteoarthritis of left knee 11/20/2014  . History of ventral hernia repair 10/27/2014  . Allergic rhinitis 08/17/2014  . Axillary hidradenitis suppurativa 08/17/2014  . Baker cyst 08/17/2014  . Chronic constipation 08/17/2014  . 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 Engages in travel abroad 08/17/2014  . Fibromyalgia 08/17/2014  . Cardiac murmur 08/17/2014  . Anemia, iron deficiency 08/17/2014  . Excessive and frequent menstruation 08/17/2014  . Dysmetabolic syndrome 08/17/2014  . Plantar fasciitis 08/17/2014  . Beat, premature ventricular 08/17/2014  . 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 Obstructive apnea 12/02/2013  . Essential (primary) hypertension 12/09/2009  . Hypertrichosis 12/09/2009  . Extreme obesity 12/09/2009   Alva GarnetPatrick McNamara PT, DPT, CSCS    02/14/2017, 3:37 PM  [facility] [address] [place], [place], [address] Phone: [phone]   Fax:  [phone]  Name: Elvera Marializabeth Kleine MRN: [id number] Date of Birth: 19-Nov-1970

## 2017-02-16 ENCOUNTER — Ambulatory Visit: Payer: 59 | Admitting: Physical Therapy

## 2017-02-16 DIAGNOSIS — R2681 Unsteadiness on feet: Secondary | ICD-10-CM | POA: Diagnosis not present

## 2017-02-16 DIAGNOSIS — M25571 Pain in right ankle and joints of right foot: Secondary | ICD-10-CM

## 2017-02-16 DIAGNOSIS — R2689 Other abnormalities of gait and mobility: Secondary | ICD-10-CM | POA: Diagnosis not present

## 2017-02-16 DIAGNOSIS — M6281 Muscle weakness (generalized): Secondary | ICD-10-CM | POA: Diagnosis not present

## 2017-02-16 NOTE — Patient Instructions (Signed)
Great toe extension mobs  DF mobs  PF mobs  Inversion Mobs  Eversion ROM -- close to WNL   Supine bridging x 10 (mild discomfort noted, but appropriate for this stage of rehab)   HEP including Bridging Toe Curls Isometrics in all 4 cardinal directions (performed in clinic as well)  Sidelying clamshells Sidelying hip abductions  Towel calf stretch

## 2017-02-17 NOTE — Therapy (Signed)
[place] [facility] PHYSICAL AND SPORTS MEDICINE [address] [place], [place], [address] Phone: [phone]   Fax:  [phone]  Physical Therapy Treatment  Patient Details  Name: Kathleen Conley MRN: [id number] Date of Birth: Jul 01, 1970 Referring Provider: [name]   Encounter Date: 02/16/2017  PT End of Session - 02/17/17 0910    Visit Number  4    Number of Visits  13    Date for PT Re-Evaluation  03/20/17    PT Start Time  0830    PT Stop Time  0915    PT Time Calculation (min)  45 min    Equipment Utilized During Treatment  Other (comment) R boot, crutches    Activity Tolerance  Patient tolerated treatment well    Behavior During Therapy  [facility] for tasks assessed/performed       Past Medical History:  Diagnosis Date  . 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 Allergy   . Anemia    Iron Deficiency  . Arthritis   . Baker cyst   . Breast mass, right 01/18/2010  . Cardiac murmur   . Chronic constipation   . Depression   . Dysmetabolic syndrome   . Fibromyalgia   . Hairiness   . Hydradenitis   . Hypertension   . Incarcerated ventral hernia 09/29/2011  . Obesity   . Obstructive sleep apnea    no CPAP  . Plantar fasciitis   . PVC (premature ventricular contraction)     Past Surgical History:  Procedure Laterality Date  . FOOT ARTHRODESIS Right 12/01/2016   Procedure: ARTHRODESIS FOOT; TRIPLE;  Surgeon: Gwyneth RevelsFowler, Justin, DPM;  Location: [facility] ORS;  Service: Podiatry;  Laterality: Right;  . HERNIA REPAIR  10/09/11   Ventral Incarcerated- [name]  . OOPHORECTOMY Left 12/09/2009  . TUBAL LIGATION  1993  . VENTRAL HERNIA REPAIR  10/09/2011    There were no vitals filed for this visit.  Subjective Assessment - 02/16/17 0833    Subjective  Patient reports she had some soreness for a short while after previous PT session, but this has dissipated. She has tried ambulating with 1 crutch and her boot  on for the last 1-2  days with no significant increase in pain.     Pertinent History  Pt is a 46 y/o F s/p reconstruction of R foot and ankle. 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 On 12/01/16 pt underwent triple arthrodesis RLE, percutaneous teno achilles lengthening RLE. Pt with soft splint R ankle following surgery and was NWB. Pt using knee walker in hospital due to poor balance with RW. Pt had appointment with [name] on 12/4 who instructed pt to begin [facility] starting with 25% and increasing thereafter. Pt reports she was instructed to WBAT with RW or crutches. Pt used to walk 1-2 miles prior to surgery for exercise before pain became too severe.  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  Pt has been off work since end of September 2018, [name] suspects potentially going back to work mid February depending on progress with PT.  Pt uses crutches in community and RW in her apartment.  Easing factors: sitting down/lying down, tylenol.  Aggravating factors: WBing activities.  Pt has not been using ice or heat.  Pt's therapy goals: return to work, walking for exercise to lose weight, be able to move  more quickly with ADLs and IADLs.  Pt independent with dressing, showering, driving.  MD gave permission to pt to return to driving with tennis shoe.  Pt reports mild pain when driving when transitioning from brake to gas pedal.   Pt denies any falls since the surgery.  Pt denies any change in weight, night sweats.  Pt able to ambulate up to 50 yards max before needing to sit due to significant pain. Pt is anticipating having same surgery on L foot in the future. Pt has custom orthotic in L shoe.     Limitations  Walking;House hold activities;Lifting;Standing    How long can you sit comfortably?  Unlimited    How long can you stand comfortably?  5 minutes    How long can you walk comfortably?  ~50 yards before significant  pain    Diagnostic tests  X-ray post op, see chart     Patient Stated Goals  return to work, return to walking for exercise, be able to complete her ADLs and IADLs more quickly.    Currently in Pain?  Yes    Pain Score  2     Pain Location  Ankle    Pain Orientation  Right;Lateral    Pain Descriptors / Indicators  Aching    Pain Type  Chronic pain;Surgical pain    Pain Onset  More than a month ago    Pain Frequency  Constant       Grade III-IV mobilizations through tolerated ROM into inversion, dorsiflexion, plantarflexion, eversion for 5 bouts in each direction x 30-45" per bout, PT noted great toe extension was still limited, thus performed grade IV++ mobilizations into great toe extensions x 5 bouts x 30-45" per bout  Educated patient on HEP including sidelying clamshells with yellow t-band (x 2 bouts x 12 repetitions) Sidelying hip abductions x 12 for 2 sets   Observed gait pattern - appropriate use of crutch on LUE, Trendelenburg pattern noted on R side with boot still on.   Supine bridging with bilateral LEs x 10 for 2 sets with boot on, appropriate mechanics.                        PT Education - 02/16/17 0915    Education provided  Yes    Education Details  Progression of HEP to address mobility and strength deficits as appropriate for this stage in her rehab.     Person(s) Educated  Patient    Methods  Explanation;Demonstration;Handout;Verbal cues    Comprehension  Verbalized understanding;Returned demonstration;Verbal cues required       PT Short Term Goals - 02/06/17 1251      PT SHORT TERM GOAL #1   Title  Pt will be independent with HEP for improved carryover between sessions    Time  2    Period  Weeks    Status  New        PT Long Term Goals - 02/06/17 1251      PT LONG TERM GOAL #1   Title  Pt's LEFS score will improve to at least 50/80 to demonstrate improved functional use of RLE    Baseline  29/80    Time  6    Period  Weeks     Status  New      PT LONG TERM GOAL #2   Title  Pt will be able to ambulate with minimal>no gait abnormalities to return to walking for exercise  Baseline  See Evaluation    Time  6    Period  Weeks    Status  New      PT LONG TERM GOAL #3   Title  Pt will be able to stand for at least 30 minutes without pain for return to work    Baseline  5 minutes    Time  5    Period  Weeks    Status  New      PT LONG TERM GOAL #4   Title  Pt's R ankle AROM will improve to [facility] for improved functional use of RLE    Baseline  See Evaluation    Time  5    Period  Weeks    Status  New      PT LONG TERM GOAL #5   Title  Pt's 10mWT will improve to at least 1.0 m/s to return to community ambulation    Baseline  0.32 m/s    Time  4    Period  Weeks    Status  New            Plan - 02/17/17 0910    Clinical Impression Statement  Patient demonstrates significant improvement in dorsiflexion ROM with stretching and joint mobilizations this date. She is quite limited with plantarflexion ROM, and had mild pain increase with isometrics of inversion and dorsiflexion. She has been able to ambulate with decreased assistance, though will require continued LE strengthening to improve her gait pattern.     Clinical Presentation  Stable    Clinical Decision Making  Moderate    Rehab Potential  Good    PT Frequency  2x / week    PT Duration  6 weeks    PT Treatment/Interventions  ADLs/Self Care Home Management;Aquatic Therapy;Biofeedback;Cryotherapy;Iontophoresis 4mg /ml Dexamethasone;Electrical Stimulation;Moist Heat;Traction;Ultrasound;Parrafin;Fluidtherapy;Contrast Bath;DME Instruction;Gait training;Stair training;Functional mobility training;Therapeutic activities;Therapeutic exercise;Balance training;Neuromuscular re-education;Patient/family education;Orthotic Fit/Training;Manual techniques;Compression bandaging;Scar mobilization;Passive range of motion;Dry needling;Energy conservation;Taping    PT  Next Visit Plan  Progress therapeutic exercise as appropriate; stair assessment; review gait mechanics; initiate manual therapy for improved DF/PF    PT Home Exercise Plan  R ankle AROM DF/PF/Inv/Ev, icing, ambulating with proper gait mechanics as documented above, massage R foot/ankle for decreased swelling    Consulted and Agree with Plan of Care  Patient       Patient will benefit from skilled therapeutic intervention in order to improve the following deficits and impairments:  Abnormal gait, Decreased activity tolerance, Decreased balance, Decreased endurance, Decreased knowledge of precautions, Decreased knowledge of use of DME, Decreased mobility, Decreased range of motion, Decreased safety awareness, Decreased scar mobility, Decreased strength, Difficulty walking, Hypomobility, Increased edema, Increased fascial restricitons, Increased muscle spasms, Impaired perceived functional ability, Impaired flexibility, Improper body mechanics, Postural dysfunction, Obesity, Pain  Visit Diagnosis: Muscle weakness (generalized)  Pain in right ankle and joints of right foot  Unsteadiness on feet  Other abnormalities of gait and mobility     Problem List Patient Active Problem List   Diagnosis Date Noted  . Post-op pain 12/01/2016  . 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 Flat foot 01/28/2016  . Aortic sclerosis 10/14/2015  . Midline low back pain without sciatica 09/20/2015  . Leukocytosis 11/29/2014  . Osteoarthritis of left knee 11/20/2014  . History of ventral hernia repair 10/27/2014  . Allergic rhinitis 08/17/2014  . Axillary hidradenitis suppurativa 08/17/2014  . Baker cyst 08/17/2014  . Chronic constipation 08/17/2014  . 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  Engages in travel abroad 08/17/2014  . Fibromyalgia 08/17/2014  . Cardiac murmur 08/17/2014  . Anemia, iron deficiency 08/17/2014  . Excessive and frequent menstruation 08/17/2014  .  Dysmetabolic syndrome 08/17/2014  . Plantar fasciitis 08/17/2014  . Beat, premature ventricular 08/17/2014  . 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 Obstructive apnea 12/02/2013  . Essential (primary) hypertension 12/09/2009  . Hypertrichosis 12/09/2009  . Extreme obesity 12/09/2009   Alva Garnet PT, DPT, CSCS    02/17/2017, 9:14 AM  [place] [facility] PHYSICAL AND SPORTS MEDICINE [address] [place], [address] Phone: [phone]   Fax:  [phone]  Name: Kathleen Conley MRN: [id number] Date of Birth: [birth year]

## 2017-02-19 ENCOUNTER — Ambulatory Visit: Payer: 59 | Admitting: Physical Therapy

## 2017-02-22 ENCOUNTER — Ambulatory Visit: Payer: 59 | Attending: Podiatry | Admitting: Physical Therapy

## 2017-02-22 DIAGNOSIS — M6281 Muscle weakness (generalized): Secondary | ICD-10-CM | POA: Insufficient documentation

## 2017-02-22 DIAGNOSIS — R2689 Other abnormalities of gait and mobility: Secondary | ICD-10-CM | POA: Insufficient documentation

## 2017-02-22 DIAGNOSIS — M25571 Pain in right ankle and joints of right foot: Secondary | ICD-10-CM | POA: Insufficient documentation

## 2017-02-22 DIAGNOSIS — R2681 Unsteadiness on feet: Secondary | ICD-10-CM | POA: Diagnosis not present

## 2017-02-22 NOTE — Patient Instructions (Signed)
BP - 155/66  HR-101 bpm  SpO2 - 97 %  PF/DF/Inversion/Eversion grade III++ mobilizations   FGA without AD 1.3 2. 3 3.  3 4.3 5. 3 6. 1 7. 0 8. 2 9. 2 10. 1  21/30

## 2017-02-23 ENCOUNTER — Encounter: Payer: Self-pay | Admitting: Family Medicine

## 2017-02-23 ENCOUNTER — Ambulatory Visit: Payer: 59 | Admitting: Family Medicine

## 2017-02-23 VITALS — BP 136/84 | HR 100 | Temp 98.2°F | Resp 18 | Ht 62.0 in | Wt 246.0 lb

## 2017-02-23 DIAGNOSIS — M797 Fibromyalgia: Secondary | ICD-10-CM

## 2017-02-23 DIAGNOSIS — E8881 Metabolic syndrome: Secondary | ICD-10-CM | POA: Diagnosis not present

## 2017-02-23 DIAGNOSIS — I10 Essential (primary) hypertension: Secondary | ICD-10-CM | POA: Diagnosis not present

## 2017-02-23 DIAGNOSIS — G4700 Insomnia, unspecified: Secondary | ICD-10-CM | POA: Diagnosis not present

## 2017-02-23 DIAGNOSIS — G4726 Circadian rhythm sleep disorder, shift work type: Secondary | ICD-10-CM

## 2017-02-23 DIAGNOSIS — G4733 Obstructive sleep apnea (adult) (pediatric): Secondary | ICD-10-CM

## 2017-02-23 DIAGNOSIS — Z9889 Other specified postprocedural states: Secondary | ICD-10-CM | POA: Diagnosis not present

## 2017-02-23 DIAGNOSIS — F330 Major depressive disorder, recurrent, mild: Secondary | ICD-10-CM | POA: Diagnosis not present

## 2017-02-23 MED ORDER — SEMAGLUTIDE(0.25 OR 0.5MG/DOS) 2 MG/1.5ML ~~LOC~~ SOPN: 0.2500 mg | PEN_INJECTOR | SUBCUTANEOUS | 1 refills | Status: DC

## 2017-02-23 MED ORDER — BUPROPION HCL ER (XL) 150 MG PO TB24: 150.0000 mg | ORAL_TABLET | Freq: Every day | ORAL | 0 refills | Status: DC

## 2017-02-23 MED ORDER — ARMODAFINIL 150 MG PO TABS: 150.0000 mg | ORAL_TABLET | Freq: Every day | ORAL | 2 refills | Status: DC

## 2017-02-23 MED ORDER — OLMESARTAN MEDOXOMIL 40 MG PO TABS: 40.0000 mg | ORAL_TABLET | Freq: Every day | ORAL | 2 refills | Status: DC

## 2017-02-23 MED ORDER — DULAGLUTIDE 1.5 MG/0.5ML ~~LOC~~ SOAJ: 1.5000 mg | SUBCUTANEOUS | 0 refills | Status: DC

## 2017-02-23 MED ORDER — DULOXETINE HCL 60 MG PO CPEP: 60.0000 mg | ORAL_CAPSULE | Freq: Every day | ORAL | 0 refills | Status: DC

## 2017-02-23 NOTE — Progress Notes (Signed)
Name: Kathleen Conley   MRN: [id number]    DOB: [birth year]   Date:02/23/2017       Progress Note  Subjective  Chief Complaint  Chief Complaint  Patient presents with  . Medication Refill    3 month F/U  . Hypertension    Denies any symptoms  . Obesity    Patient Insurance did not cover Saxenda and has stopped Metformin  . Depression  . Fibromyalgia    Stable  . Sleep Apnea    HPI  Chronic right foot pain: had arthrodesis and tendon repair done of right foot on 12/01/2016, just now resumed driving and is having PT. Pain is under control, but still causes discomfort when standing for a prolonged period of time.  Major Depression moderate: she was on  Cymbalta 90 mg daily since Summer 2017 and was responding well, however since Dec 2017 she noticed worsening of anhedonia, no motivation to even cook ( something she likes), feeling sad and hopeless, she felt like there was no purpose in living. We added Wellbutrin April 2018 and she wasfeeling much better, more energy , started to cook again, feeling less tired. She developed some tardive dyskinesia and we decreased dose of Cymbalta and started to feel tired again, but is doing well now, energy level is back to normal and no anhedonia. No other side effects. She is taking Cymbalta 55m Wellbutrin 150, she had surgery 11/2016 and was not driving so started to feel down again, but is driving again and getting back to normal. She noticed problems sleeping since surgery, but responded to melatonin but made her groggy during the day, advised to go down from 10 mg to no more than 2 mg at night.   Metabolic syndrome: she denies polyphagia, polydipsia or polyuria. She has insulin resistance with level above 30, discussed long term risk of DM and she is willing to try medication to help her lose weight. She was given rx of Saxenda, but initially not approved, we gave her metformin but is causing diarrhea. SKirke Shaggyhas been denied, we will try  another GLP-1 agonist  Obesity: she has a long history of obesity, started after her divorce when she was in her 89's She tried weight watchers but was unsuccessful. She states at the time she used food to comfort herself. She still does that at times. She has decided to eat better, she is drinking sodas less often, is cutting down on fruit juices, drinking more water, not exercising because of recent surgery, willing to try a GLP-1 agonist  FMS: she states pain is better with Cymbalta, could not tolerate Lyrica ( caused nightmares and vivid dreams), pain level on Cymbalta is 0/10, states only pain she notices at this time is the foot pain. Symptoms are worse when she is sedentary for a long time, she has episodes of mental fogginess.   OSA: she has mild symptoms, not on CPAP, sleeping on her side, she is not waking up with headaches  HTN: well controlled, taking Benicar and denies side effects, no  chest pain or SOB. Palpitation resolved   Patient Active Problem List   Diagnosis Date Noted  . Post-op pain 12/01/2016  . 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 Flat foot 01/28/2016  . Aortic sclerosis 10/14/2015  . Midline low back pain without sciatica 09/20/2015  . Leukocytosis 11/29/2014  . Osteoarthritis of left knee 11/20/2014  . History of ventral hernia repair 10/27/2014  . Allergic rhinitis 08/17/2014  . Axillary  hidradenitis suppurativa 08/17/2014  . Baker cyst 08/17/2014  . Chronic constipation 08/17/2014  . Depression, major, recurrent, mild ([place]) 08/17/2014  . Engages in travel abroad 08/17/2014  . Fibromyalgia 08/17/2014  . Cardiac murmur 08/17/2014  . Anemia, iron deficiency 08/17/2014  . Excessive and frequent menstruation 08/17/2014  . Dysmetabolic syndrome 10/03/4816  . Plantar fasciitis 08/17/2014  . Beat, premature ventricular 08/17/2014  . 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 Obstructive apnea  12/02/2013  . Essential (primary) hypertension 12/09/2009  . Hypertrichosis 12/09/2009  . Extreme obesity 12/09/2009    Past Surgical History:  Procedure Laterality Date  . FOOT ARTHRODESIS Right 12/01/2016   Procedure: ARTHRODESIS FOOT; TRIPLE;  Surgeon: Samara Deist, DPM;  Location: [facility] ORS;  Service: Podiatry;  Laterality: Right;  . HERNIA REPAIR  10/09/11   [facility]- Dr. Pat Patrick  . OOPHORECTOMY Left 12/09/2009  . TUBAL LIGATION  1993  . VENTRAL HERNIA REPAIR  10/09/2011    Family History  Problem Relation Age of Onset  . Hypertension Mother   . CVA Mother   . Kidney disease Mother   . Congenital heart disease Mother   . Heart disease Father   . Hypertension Father   . Diabetes Father   . Breast cancer Paternal Grandmother        Bilateral  . Colon cancer Maternal Grandfather   . Colon cancer Maternal Uncle     Social History   Socioeconomic History  . Marital status: Single    Spouse name: Not on file  . Number of children: 3  . Years of education: Associates  . Highest education level: Not on file  Social Needs  . Financial resource strain: Not on file  . Food insecurity - worry: Not on file  . Food insecurity - inability: Not on file  . Transportation needs - medical: Not on file  . Transportation needs - non-medical: Not on file  Occupational History  . Occupation: LPN at the [employer]  . Smoking status: Never Smoker  . Smokeless tobacco: Never Used  Substance and Sexual Activity  . Alcohol use: No    Alcohol/week: 0.0 oz  . Drug use: No  . Sexual activity: No    Birth control/protection: IUD  Other Topics Concern  . Not on file  Social History Narrative  . Not on file     Current Outpatient Medications:  .  acetaminophen (TYLENOL) 500 MG tablet, Take 1 tablet (500 mg total) by mouth every 6 (six) hours as needed. Max of 3 grams daily or 6 pills dialy (Patient taking differently: Take 1,000 mg by mouth every 6  (six) hours as needed for moderate pain or headache. ), Disp: 30 tablet, Rfl: 0 .  Armodafinil (NUVIGIL) 150 MG tablet, Take 1 tablet (150 mg total) by mouth daily., Disp: 30 tablet, Rfl: 2 .  aspirin EC 81 MG tablet, Take 1 tablet (81 mg total) by mouth daily., Disp: 30 tablet, Rfl: 0 .  buPROPion (WELLBUTRIN XL) 150 MG 24 hr tablet, Take 1 tablet (150 mg total) by mouth daily., Disp: 90 tablet, Rfl: 0 .  Cholecalciferol (VITAMIN D) 2000 UNITS tablet, Take 2,000 Units by mouth daily. , Disp: , Rfl:  .  docusate sodium (COLACE) 100 MG capsule, Take 100 mg by mouth daily as needed for moderate constipation. , Disp: , Rfl:  .  DULoxetine (CYMBALTA) 60 MG capsule, Take 1 capsule (60 mg total) by mouth daily., Disp: 90  capsule, Rfl: 0 .  ferrous sulfate 325 (65 FE) MG tablet, Take 325 mg by mouth daily. , Disp: , Rfl:  .  fluticasone (FLONASE) 50 MCG/ACT nasal spray, Place 2 sprays into both nostrils daily. (Patient taking differently: Place 2 sprays into both nostrils daily as needed for allergies. ), Disp: 16 g, Rfl: 1 .  ibuprofen (ADVIL,MOTRIN) 200 MG tablet, Take 400 mg by mouth every 6 (six) hours as needed for headache or moderate pain., Disp: , Rfl:  .  levonorgestrel (MIRENA, 52 MG,) 20 MCG/24HR IUD, 1 each by Intrauterine route once. , Disp: , Rfl:  .  loratadine (CLARITIN) 10 MG tablet, Take 1 tablet (10 mg total) by mouth daily. (Patient taking differently: Take 10 mg by mouth daily as needed for allergies. ), Disp: 30 tablet, Rfl: 11 .  metaxalone (SKELAXIN) 800 MG tablet, Take 1 tablet (800 mg total) by mouth 3 (three) times daily. (Patient taking differently: Take 800 mg by mouth 3 (three) times daily as needed for muscle spasms. ), Disp: 90 tablet, Rfl: 2 .  Multiple Vitamins-Minerals (MULTIVITAMIN PO), Take 1 tablet by mouth daily., Disp: , Rfl:  .  olmesartan (BENICAR) 40 MG tablet, Take 1 tablet (40 mg total) by mouth daily., Disp: 30 tablet, Rfl: 2 .  Dulaglutide (TRULICITY) 1.5  XL/2.4MW SOPN, Inject 1.5 mg into the skin once a week., Disp: 4 pen, Rfl: 0  Allergies  Allergen Reactions  . Hctz [Hydrochlorothiazide] Cough  . Lisinopril Cough          ROS  Constitutional: Negative for fever or weight change.  Respiratory: Negative for cough and shortness of breath.   Cardiovascular: Negative for chest pain or palpitations.  Gastrointestinal: Negative for abdominal pain, no bowel changes.  Musculoskeletal: Positive for gait problem but no  joint swelling.  Skin: Negative for rash.  Neurological: Negative for dizziness or headache.  No other specific complaints in a complete review of systems (except as listed in HPI above).  Objective  Vitals:   02/23/17 1331  BP: 136/84  Pulse: 100  Resp: 18  Temp: 98.2 F (36.8 C)  TempSrc: Oral  SpO2: 98%  Weight: 246 lb (111.6 kg)  Height: _0  (1.575 m)    Body mass index is 44.99 kg/m.  Physical Exam  Constitutional: Patient appears well-developed and well-nourished. Obese  No distress.  HEENT: head atraumatic, normocephalic, pupils equal and reactive to light, strabismus  neck supple, throat within normal limits Cardiovascular: Normal rate, regular rhythm and normal heart sounds.  No murmur heard. No BLE edema. Pulmonary/Chest: Effort normal and breath sounds normal. No respiratory distress. Abdominal: Soft.  There is no tenderness. Psychiatric: Patient has a normal mood and affect. behavior is normal. Judgment and thought content normal. Muscular Skeletal: right foot surgery and wearing an ortho boot  Recent Results (from the past 2160 hour(s))  Pap IG and HPV (high risk) DNA detection     Status: None   Collection Time: 11/28/16 10:53 AM  Result Value Ref Range   Clinical Information:      Comment: Other high risk factor, specify NA    LMP:      Comment: N/A   PREV. PAP:      Comment: N/A   PREV. BX:      Comment: N/A   HPV DNA Probe-Source      Comment: None given   STATEMENT OF  ADEQUACY:      Comment: Satisfactory for evaluation. Endocervical/transformation zone component present. Age and/or  menstrual status not provided    INTERPRETATION/RESULT:      Comment: Negative for intraepithelial lesion or malignancy.   Comment:      Comment: This Pap test has been evaluated with computer assisted technology.    CYTOTECHNOLOGIST:      Comment: JLT, CT(ASCP) CT screening location: [address], [address], [place],  [address]    HPV DNA High Risk Not Detected Not Detect    Comment: This test was performed using the APTIMA HPV Assay ([employer] . This assay detects E6/E7 viral messenger RNA (mRNA) from 14 high-risk HPV types (16,18,31,33,35,39,45,51,52,56,58,59,66,68). . The analytical performance characteristics of this assay have been determined by [facility]. The modifications have not been cleared or approved by the FDA. This assay has been validated pursuant to the CLIA regulations and is used for clinical purposes. EXPLANATORY NOTE:  . The Pap is a screening test for cervical cancer. It is  not a diagnostic test and is subject to false negative  and false positive results. It is most reliable when a  satisfactory sample, regularly obtained, is submitted  with relevant clinical findings and history, and when  the Pap result is evaluated along with historic and  current clinical information. .   Lipid panel     Status: Abnormal   Collection Time: 11/29/16  9:48 AM  Result Value Ref Range   Cholesterol 220 (H) <200 mg/dL   HDL 55 >50 mg/dL   Triglycerides 138 <150 mg/dL   LDL Cholesterol (Calc) 138 (H) mg/dL (calc)    Comment: Reference range: <100 . Desirable range <100 mg/dL for primary prevention;   <70 mg/dL for patients with CHD or diabetic patients  with > or = 2 CHD risk factors. Marland Kitchen LDL-C is now calculated using the Martin-Hopkins  calculation, which is a validated novel method providing  better accuracy than the  Friedewald equation in the  estimation of LDL-C.  Cresenciano Genre et al. Annamaria Helling. 6269;485(46): 2061-2068  (http://education.[employer].com/faq/FAQ164)    Total CHOL/HDL Ratio 4.0 <5.0 (calc)   Non-HDL Cholesterol (Calc) 165 (H) <130 mg/dL (calc)    Comment: For patients with diabetes plus 1 major ASCVD risk  factor, treating to a non-HDL-C goal of <100 mg/dL  (LDL-C of <70 mg/dL) is considered a therapeutic  option.   CBC with Differential/Platelet     Status: Abnormal   Collection Time: 11/29/16  9:48 AM  Result Value Ref Range   WBC 11.3 (H) 3.8 - 10.8 Thousand/uL   RBC 4.50 3.80 - 5.10 Million/uL   Hemoglobin 12.0 11.7 - 15.5 g/dL   HCT 36.7 35.0 - 45.0 %   MCV 81.6 80.0 - 100.0 fL   MCH 26.7 (L) 27.0 - 33.0 pg   MCHC 32.7 32.0 - 36.0 g/dL   RDW 15.4 (H) 11.0 - 15.0 %   Platelets 399 140 - 400 Thousand/uL   MPV 8.8 7.5 - 12.5 fL   Neutro Abs 6,995 1,500 - 7,800 cells/uL   Lymphs Abs 3,209 850 - 3,900 cells/uL   WBC mixed population 723 200 - 950 cells/uL   Eosinophils Absolute 328 15 - 500 cells/uL   Basophils Absolute 45 0 - 200 cells/uL   Neutrophils Relative % 61.9 %   Total Lymphocyte 28.4 %   Monocytes Relative 6.4 %   Eosinophils Relative 2.9 %   Basophils Relative 0.4 %  COMPLETE METABOLIC PANEL WITH GFR     Status: None   Collection Time: 11/29/16  9:48 AM  Result Value Ref  Range   Glucose, Bld 83 65 - 99 mg/dL    Comment: .            Fasting reference interval .    BUN 12 7 - 25 mg/dL   Creat 0.60 0.50 - 1.10 mg/dL   GFR, Est Non African American 109 > OR = 60 mL/min/1.34m   GFR, Est African American 126 > OR = 60 mL/min/1.726m  BUN/Creatinine Ratio NOT APPLICABLE 6 - 22 (calc)   Sodium 142 135 - 146 mmol/L   Potassium 4.3 3.5 - 5.3 mmol/L   Chloride 106 98 - 110 mmol/L   CO2 31 20 - 32 mmol/L   Calcium 9.2 8.6 - 10.2 mg/dL   Total Protein 7.1 6.1 - 8.1 g/dL   Albumin 4.1 3.6 - 5.1 g/dL   Globulin 3.0 1.9 - 3.7 g/dL (calc)   AG Ratio 1.4 1.0 - 2.5  (calc)   Total Bilirubin 0.3 0.2 - 1.2 mg/dL   Alkaline phosphatase (APISO) 76 33 - 115 U/L   AST 12 10 - 35 U/L   ALT 12 6 - 29 U/L  Hemoglobin A1c     Status: Abnormal   Collection Time: 11/29/16  9:48 AM  Result Value Ref Range   Hgb A1c MFr Bld 5.7 (H) <5.7 % of total Hgb    Comment: For someone without known diabetes, a hemoglobin  A1c value between 5.7% and 6.4% is consistent with prediabetes and should be confirmed with a  follow-up test. . For someone with known diabetes, a value <7% indicates that their diabetes is well controlled. A1c targets should be individualized based on duration of diabetes, age, comorbid conditions, and other considerations. . This assay result is consistent with an increased risk of diabetes. . Currently, no consensus exists regarding use of hemoglobin A1c for diagnosis of diabetes for children. .    Mean Plasma Glucose 117 (calc)   eAG (mmol/L) 6.5 (calc)  Insulin, random     Status: Abnormal   Collection Time: 11/29/16  9:48 AM  Result Value Ref Range   Insulin 28.7 (H) 2.0 - 19.6 uIU/mL    Comment: This insulin assay shows strong cross-reactivity for some insulin analogs (lispro, aspart, and glargine) and much lower cross-reactivity with others (detemir, glulisine).   Surgical pathology     Status: None   Collection Time: 12/01/16 10:02 AM  Result Value Ref Range   SURGICAL PATHOLOGY      Surgical Pathology CASE: [id number]: ELGaynelle Arabianurgical Pathology Report     SPECIMEN SUBMITTED: A. Inflammatory tissue, right subtalar joint  CLINICAL HISTORY: None provided  PRE-OPERATIVE DIAGNOSIS: Foot pain - right, tibialis posterior dysfunction  POST-OPERATIVE DIAGNOSIS: Same as pre-op     DIAGNOSIS: A. RIGHT SUBTALAR JOINT TISSUE; ARTHRODESIS: - FIBRINOUS AND MUCOID MATERIAL, AND SCANT SYNOVIAL LINING CELLS. - NEGATIVE FOR ACUTE INFLAMMATION.   GROSS DESCRIPTION: A. Labeled: inflammatory tissue  right subtalar joint Tissue fragment(s): 1 Size: 1.5 x 1.0 x 0.2 cm Description: tan to grey fibrous fragment  Entirely submitted in 1 cassette(s).  Final Diagnosis performed by MaBryan LemmaMD.  Electronically signed 12/05/2016 4:05:29PM    The electronic signature indicates that the named Attending Pathologist has evaluated the specimen  Technical component performed at [facility] [id number]: [phone] Dir: WiDarrick PennaHaEvette DoffingMD  Professional component performed at [facility] [address]: [phone]: TaDellia NimsRuReuel DerbyMD  HIV antibody (Routine Testing)     Status: None   Collection Time: 12/01/16  3:33 PM  Result Value Ref Range   HIV Screen 4th Generation wRfx Non Reactive Non Reactive    Comment: (NOTE) Performed At: [facility] [place], [place] [id number] Lindon Romp MD KG:[id number]   CBC     Status: Abnormal   Collection Time: 12/01/16  3:33 PM  Result Value Ref Range   WBC 14.1 (H) 3.6 - 11.0 K/uL   RBC 4.21 3.80 - 5.20 MIL/uL   Hemoglobin 11.3 (L) 12.0 - 16.0 g/dL   HCT 35.2 35.0 - 47.0 %   MCV 83.5 80.0 - 100.0 fL   MCH 26.8 26.0 - 34.0 pg   MCHC 32.1 32.0 - 36.0 g/dL   RDW 16.8 (H) 11.5 - 14.5 %   Platelets 382 150 - 440 K/uL  Creatinine, serum     Status: None   Collection Time: 12/01/16  3:33 PM  Result Value Ref Range   Creatinine, Ser 0.60 0.44 - 1.00 mg/dL   GFR calc non Af Amer >60 >60 mL/min   GFR calc Af Amer >60 >60 mL/min    Comment: (NOTE) The eGFR has been calculated using the CKD EPI equation. This calculation has not been validated in all clinical situations. eGFR's persistently <60 mL/min signify possible Chronic Kidney Disease.       PHQ2/9: Depression screen [facility] 2/9 02/23/2017 11/28/2016 11/15/2016 07/03/2016 05/31/2016  Decreased Interest 0 0 0 1 1  Down, Depressed, Hopeless 0 0 0 1 1  PHQ - 2  Score 0 0 0 2 2  Altered sleeping - - - 0 1  Tired, decreased energy - - - 0 1  Change in appetite - - - 1 0  Feeling bad or failure about yourself  - - - 1 0  Trouble concentrating - - - 0 0  Moving slowly or fidgety/restless - - - 0 0  Suicidal thoughts - - - 0 0  PHQ-9 Score - - - 4 4  Difficult doing work/chores - - - Somewhat difficult Somewhat difficult     Fall Risk: Fall Risk  02/23/2017 11/28/2016 11/15/2016 07/03/2016 05/31/2016  Falls in the past year? _0     Functional Status Survey: Is the patient deaf or have difficulty hearing?: No Does the patient have difficulty seeing, even when wearing glasses/contacts?: No Does the patient have difficulty concentrating, remembering, or making decisions?: No Does the patient have difficulty walking or climbing stairs?: Yes Does the patient have difficulty dressing or bathing?: No Does the patient have difficulty doing errands alone such as visiting a doctor's office or shopping?: No    Assessment & Plan  1. Depression, major, recurrent, mild (HCC)  - buPROPion (WELLBUTRIN XL) 150 MG 24 hr tablet; Take 1 tablet (150 mg total) by mouth daily.  Dispense: 90 tablet; Refill: 0 - DULoxetine (CYMBALTA) 60 MG capsule; Take 1 capsule (60 mg total) by mouth daily.  Dispense: 90 capsule; Refill: 0  2. Obstructive apnea  - Armodafinil (NUVIGIL) 150 MG tablet; Take 1 tablet (150 mg total) by mouth daily.  Dispense: 30 tablet; Refill: 2  3. Shift work sleep disorder  - Armodafinil (NUVIGIL) 150 MG tablet; Take 1 tablet (150 mg total) by mouth daily.  Dispense: 30 tablet; Refill: 2  4. Fibromyalgia  - DULoxetine (CYMBALTA) 60 MG capsule; Take 1 capsule (60 mg total) by mouth daily.  Dispense: 90 capsule; Refill: 0  5. Essential (primary) hypertension  Continue Benicar  6. Dysmetabolic syndrome  Discussed medication She denies personal history of pancreatitis or family history of thyroid cancer We will start Trulicity    7. S/P foot surgery, right  Recent surgery

## 2017-02-23 NOTE — Therapy (Signed)
[facility] MEDICINE [address], [place], [address] Phone: [phone]   Fax:  [phone]  Physical Therapy Treatment  Patient Details  Name: Kathleen Conley MRN: [id number] Date of Birth: 1970-03-22 Referring Provider: [name]   Encounter Date: 02/22/2017  PT End of Session - 02/23/17 0842    Visit Number  5    Number of Visits  13    Date for PT Re-Evaluation  03/20/17    PT Start Time  1345    PT Stop Time  1430    PT Time Calculation (min)  45 min    Equipment Utilized During Treatment  Other (comment) R boot, crutches    Activity Tolerance  Patient tolerated treatment well    Behavior During Therapy  [facility] for tasks assessed/performed       Past Medical History:  Diagnosis Date  . 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 Allergy   . Anemia    Iron Deficiency  . Arthritis   . Baker cyst   . Breast mass, right 01/18/2010  . Cardiac murmur   . Chronic constipation   . Depression   . Dysmetabolic syndrome   . Fibromyalgia   . Hairiness   . Hydradenitis   . Hypertension   . Incarcerated ventral hernia 09/29/2011  . Obesity   . Obstructive sleep apnea    no CPAP  . Plantar fasciitis   . PVC (premature ventricular contraction)     Past Surgical History:  Procedure Laterality Date  . FOOT ARTHRODESIS Right 12/01/2016   Procedure: ARTHRODESIS FOOT; TRIPLE;  Surgeon: [name], DPM;  Location: [facility] ORS;  Service: Podiatry;  Laterality: Right;  . HERNIA REPAIR  10/09/11   Ventral Incarcerated- Dr. [name]  . OOPHORECTOMY Left 12/09/2009  . TUBAL LIGATION  1993  . VENTRAL HERNIA REPAIR  10/09/2011    There were no vitals filed for this visit.  Subjective Assessment - 02/23/17 0841    Subjective  Patient reports she has not had any pain in her foot/ankle recently and that she is getting more confident in ambulating with a boot on but no crutch for  support.     Pertinent History  Pt is a 47 y/o F s/p reconstruction of R foot and ankle. 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 On 12/01/16 pt underwent triple arthrodesis RLE, percutaneous teno achilles lengthening RLE. Pt with soft splint R ankle following surgery and was NWB. Pt using knee walker in hospital due to poor balance with RW. Pt had appointment with Dr. Ether Griffins on 12/4 who instructed pt to begin [facility] starting with 25% and increasing thereafter. Pt reports she was instructed to WBAT with RW or crutches. Pt used to walk 1-2 miles prior to surgery for exercise before pain became too severe.  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  Pt has been off work since end of September 2018, Dr. Ether Griffins suspects potentially going back to work mid February depending on progress with PT.  Pt uses crutches in community and RW in her apartment.  Easing factors: sitting down/lying down, tylenol.  Aggravating factors: WBing activities.  Pt has not been using ice or heat.  Pt's therapy goals: return to work, walking for exercise to lose weight, be able to move more quickly with ADLs and IADLs.  Pt independent with dressing,  showering, driving.  MD gave permission to pt to return to driving with tennis shoe.  Pt reports mild pain when driving when transitioning from brake to gas pedal.   Pt denies any falls since the surgery.  Pt denies any change in weight, night sweats.  Pt able to ambulate up to 50 yards max before needing to sit due to significant pain. Pt is anticipating having same surgery on L foot in the future. Pt has custom orthotic in L shoe.     Limitations  Walking;House hold activities;Lifting;Standing    How long can you sit comfortably?  Unlimited    How long can you stand comfortably?  5 minutes    How long can you walk comfortably?  ~50 yards before significant pain    Diagnostic tests  X-ray post op, see chart      Patient Stated Goals  return to work, return to walking for exercise, be able to complete her ADLs and IADLs more quickly.    Currently in Pain?  No/denies       BP - 155/66  HR-101 bpm  SpO2 - 97 %  PF/DF/Inversion/Eversion grade III++ mobilizations through tolerated ROM in each direction x 3 bouts x 30-45" per bout with no increased discomfort reported by patient (keeping in cardinal planes at this time secondary to continued significant decrease in ROM)  FGA without AD 1.3 2. 3 3.  3 4.3 5. 3 6. 1 7. 0 8. 2 9. 2 10. 1  21/30 indicative of slightly increased falls risk  Attempted single leg stance with bilateral UE support on railing with blue foam pad underneath her R foot, unable to tolerate even with boot on. Performed soft tissue mobilization over 5th metatarsal area which she reports helped decrease her pain after attempt at single leg stance.                      PT Education - 02/23/17 0841    Education provided  Yes    Education Details  Not ready yet for single leg activities, will continue to progress her LE tolerance for loading to return to more active lifestyle.     Person(s) Educated  Patient    Methods  Explanation    Comprehension  Verbalized understanding       PT Short Term Goals - 02/06/17 1251      PT SHORT TERM GOAL #1   Title  Pt will be independent with HEP for improved carryover between sessions    Time  2    Period  Weeks    Status  New        PT Long Term Goals - 02/06/17 1251      PT LONG TERM GOAL #1   Title  Pt's LEFS score will improve to at least 50/80 to demonstrate improved functional use of RLE    Baseline  49/80    Time  6    Period  Weeks    Status  New      PT LONG TERM GOAL #2   Title  Pt will be able to ambulate with minimal>no gait abnormalities to return to walking for exercise    Baseline  See Evaluation    Time  6    Period  Weeks    Status  New      PT LONG TERM GOAL #3   Title  Pt will  be able to stand for at least 30 minutes without pain  for return to work    Baseline  5 minutes    Time  5    Period  Weeks    Status  New      PT LONG TERM GOAL #4   Title  Pt's [facility] improved functional use of [facility]LE    Baseline  See Evaluation    Time  5    Period  Weeks    Status  New      PT LONG TERM GOAL #5   Title  Pt's 10mWT will improve to at least 1.0 m/s to return to community ambulation    Baseline  0.32 m/s    Time  4    Period  Weeks    Status  New            Plan - 02/23/17 [id number]    Clinical Impression Statement  Patient scored quite well with FGA (21/30) indicative of decreased falls risk without AD. She is still unable to tolerate single leg weight bearing exercises on foam pad in boot with use of bilateral UEs. Will continue to work on her range of motion and loading tolerance for her RLE, however significant progress is noted in both areas.     Clinical Presentation  Stable    Clinical Decision Making  Moderate    Rehab Potential  Good    PT Frequency  2x / week    PT Duration  6 weeks    PT Treatment/Interventions  ADLs/Self Care Home Management;Aquatic Therapy;Biofeedback;Cryotherapy;Iontophoresis 4mg /ml Dexamethasone;Electrical Stimulation;Moist Heat;Traction;Ultrasound;Parrafin;Fluidtherapy;Contrast Bath;DME Instruction;Gait training;Stair training;Functional mobility training;Therapeutic activities;Therapeutic exercise;Balance training;Neuromuscular re-education;Patient/family education;Orthotic Fit/Training;Manual techniques;Compression bandaging;Scar mobilization;Passive range of motion;Dry needling;Energy conservation;Taping    PT Next Visit Plan  Progress therapeutic exercise as appropriate; stair assessment; review gait mechanics; initiate manual therapy for improved DF/PF    PT Home Exercise Plan  R ankle AROM DF/PF/Inv/Ev, icing, ambulating with proper gait mechanics as documented above, massage R foot/ankle for decreased  swelling    Consulted and Agree with Plan of Care  Patient       Patient will benefit from skilled therapeutic intervention in order to improve the following deficits and impairments:  Abnormal gait, Decreased activity tolerance, Decreased balance, Decreased endurance, Decreased knowledge of precautions, Decreased knowledge of use of DME, Decreased mobility, Decreased range of motion, Decreased safety awareness, Decreased scar mobility, Decreased strength, Difficulty walking, Hypomobility, Increased edema, Increased fascial restricitons, Increased muscle spasms, Impaired perceived functional ability, Impaired flexibility, Improper body mechanics, Postural dysfunction, Obesity, Pain  Visit Diagnosis: Muscle weakness (generalized)  Pain in right ankle and joints of right foot  Unsteadiness on feet  Other abnormalities of gait and mobility     Problem List Patient Active Problem List   Diagnosis Date Noted  . Post-op pain 12/01/2016  . 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 Flat foot 01/28/2016  . Aortic sclerosis 10/14/2015  . Midline low back pain without sciatica 09/20/2015  . Leukocytosis 11/29/2014  . Osteoarthritis of left knee 11/20/2014  . History of ventral hernia repair 10/27/2014  . Allergic rhinitis 08/17/2014  . Axillary hidradenitis suppurativa 08/17/2014  . Baker cyst 08/17/2014  . Chronic constipation 08/17/2014  . 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 Engages in travel abroad 08/17/2014  . Fibromyalgia 08/17/2014  . Cardiac murmur 08/17/2014  . Anemia, iron deficiency 08/17/2014  . Excessive and frequent menstruation 08/17/2014  . Dysmetabolic syndrome 08/17/2014  . Plantar fasciitis 08/17/2014  . Beat, premature  ventricular 08/17/2014  . 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 Obstructive apnea 12/02/2013  . Essential (primary) hypertension 12/09/2009  . Hypertrichosis 12/09/2009  .  Extreme obesity 12/09/2009   Alva Garnet PT, DPT, CSCS    02/23/2017, 8:46 AM  [facility] SPORTS MEDICINE [address] [place], [address] Phone: [phone]   Fax:  [phone]  Name: Kathleen Conley MRN: [id number] Date of Birth: May 21, 1970

## 2017-02-26 ENCOUNTER — Other Ambulatory Visit: Payer: Self-pay

## 2017-02-26 ENCOUNTER — Ambulatory Visit: Payer: 59

## 2017-02-26 DIAGNOSIS — M25571 Pain in right ankle and joints of right foot: Secondary | ICD-10-CM | POA: Diagnosis not present

## 2017-02-26 DIAGNOSIS — R2689 Other abnormalities of gait and mobility: Secondary | ICD-10-CM | POA: Diagnosis not present

## 2017-02-26 DIAGNOSIS — M6281 Muscle weakness (generalized): Secondary | ICD-10-CM | POA: Diagnosis not present

## 2017-02-26 DIAGNOSIS — R2681 Unsteadiness on feet: Secondary | ICD-10-CM | POA: Diagnosis not present

## 2017-02-26 NOTE — Therapy (Signed)
[facility] MEDICINE [address], [place], [address] Phone: [phone]   Fax:  [phone]  Physical Therapy Treatment  Patient Details  Name: Kathleen Conley MRN: [id number] Date of Birth: 03-06-1970 Referring Provider: [name]   Encounter Date: 02/26/2017  PT End of Session - 02/26/17 1350    Visit Number  6    Number of Visits  13    Date for PT Re-Evaluation  03/20/17    PT Start Time  1352    PT Stop Time  1430    PT Time Calculation (min)  38 min    Equipment Utilized During Treatment  Other (comment) R boot, crutches    Activity Tolerance  Patient tolerated treatment well    Behavior During Therapy  [facility] for tasks assessed/performed       Past Medical History:  Diagnosis Date  . 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 Allergy   . Anemia    Iron Deficiency  . Arthritis   . Baker cyst   . Breast mass, right 01/18/2010  . Cardiac murmur   . Chronic constipation   . Depression   . Dysmetabolic syndrome   . Fibromyalgia   . Hairiness   . Hydradenitis   . Hypertension   . Incarcerated ventral hernia 09/29/2011  . Obesity   . Obstructive sleep apnea    no CPAP  . Plantar fasciitis   . PVC (premature ventricular contraction)     Past Surgical History:  Procedure Laterality Date  . FOOT ARTHRODESIS Right 12/01/2016   Procedure: ARTHRODESIS FOOT; TRIPLE;  Surgeon: [name], DPM;  Location: [facility] ORS;  Service: Podiatry;  Laterality: Right;  . HERNIA REPAIR  10/09/11   Ventral Incarcerated- Dr. [name]  . OOPHORECTOMY Left 12/09/2009  . TUBAL LIGATION  1993  . VENTRAL HERNIA REPAIR  10/09/2011    There were no vitals filed for this visit.  Subjective Assessment - 02/26/17 1349    Subjective  Patient reports she has not had any pain in her foot/ankle recently. Pt is gaining confidence in ambulating with a boot but is still limited in the distance she  can walk. No specific questions or concerns at this time.     Pertinent History  Pt is a 47 y/o F s/p reconstruction of R foot and ankle. 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 On 12/01/16 pt underwent triple arthrodesis RLE, percutaneous teno achilles lengthening RLE. Pt with soft splint R ankle following surgery and was NWB. Pt using knee walker in hospital due to poor balance with RW. Pt had appointment with Dr. Ether Griffins on 12/4 who instructed pt to begin [facility] starting with 25% and increasing thereafter. Pt reports she was instructed to WBAT with RW or crutches. Pt used to walk 1-2 miles prior to surgery for exercise before pain became too severe.  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  Pt has been off work since end of September 2018, Dr. Ether Griffins suspects potentially going back to work mid February depending on progress with PT.  Pt uses crutches in community and RW in her apartment.  Easing factors: sitting down/lying down, tylenol.  Aggravating factors: WBing activities.  Pt has not been using ice or heat.  Pt's therapy goals: return to work, walking for exercise to lose weight, be able to move more quickly  with ADLs and IADLs.  Pt independent with dressing, showering, driving.  MD gave permission to pt to return to driving with tennis shoe.  Pt reports mild pain when driving when transitioning from brake to gas pedal.   Pt denies any falls since the surgery.  Pt denies any change in weight, night sweats.  Pt able to ambulate up to 50 yards max before needing to sit due to significant pain. Pt is anticipating having same surgery on L foot in the future. Pt has custom orthotic in L shoe.     Limitations  Walking;House hold activities;Lifting;Standing    How long can you sit comfortably?  Unlimited    How long can you stand comfortably?  5 minutes    How long can you walk comfortably?  ~50 yards before significant pain     Diagnostic tests  X-ray post op, see chart     Patient Stated Goals  return to work, return to walking for exercise, be able to complete her ADLs and IADLs more quickly.    Currently in Pain?  No/denies           TREATMENT  Manual Therapy   Long sitting dorsiflexion stretch with manual pressure from therapist 30s hold x 3; Grade II-III talocrural AP mobilizations at end range DF 30s/bout x 4 bouts; Grade IV PA mobilizations into great toe extension x 4 bouts, 30" per bout Scar massage performed to scar on lateral and dorsal aspects of foot, pt educated about how to perform at home;  Ther-ex  Long sitting R ankle isometrics 5s hold x 10 for DF, PF, inversion, and eversion, pt denies pain with all isometrics; R ankle AROM against light resistance with yellow theraband 2 x 10 each direction for DF, PF, inversion, and eversion, pt denies pain with all strengthening; Issued yellow tband exercises for HEP, written handout provided top patient;                  PT Education - 02/26/17 1352    Education provided  Yes    Education Details  HEP progression, exercise form/technique    Person(s) Educated  Patient    Methods  Explanation    Comprehension  Verbalized understanding       PT Short Term Goals - 02/06/17 1251      PT SHORT TERM GOAL #1   Title  Pt will be independent with HEP for improved carryover between sessions    Time  2    Period  Weeks    Status  New        PT Long Term Goals - 02/06/17 1251      PT LONG TERM GOAL #1   Title  Pt's LEFS score will improve to at least 50/80 to demonstrate improved functional use of RLE    Baseline  29/80    Time  6    Period  Weeks    Status  New      PT LONG TERM GOAL #2   Title  Pt will be able to ambulate with minimal>no gait abnormalities to return to walking for exercise    Baseline  See Evaluation    Time  6    Period  Weeks    Status  New      PT LONG TERM GOAL #3   Title  Pt will be able to  stand for at least 30 minutes without pain for return to work    Baseline  5 minutes  Time  5    Period  Weeks    Status  New      PT LONG TERM GOAL #4   Title  Pt's R ankle AROM will improve to [facility] for improved functional use of RLE    Baseline  See Evaluation    Time  5    Period  Weeks    Status  New      PT LONG TERM GOAL #5   Title  Pt's will improve to at least 1.0 m/s to return to community ambulation    Baseline  0.32 m/s    Time  4    Period  Weeks    Status  New            Plan - 02/26/17 1350    Clinical Impression Statement  Pt denies pain throughout session today. She is able to progress to some lightly resisted ankle strengthening during session. Progressed HEP to 4 way ankle strengthening with yellow theraband. Improving ankle DF ROM with stretching and manual therapy. Pt encouraged to continue additional HEP and follow-up as scheduled.     Clinical Presentation  Stable    Rehab Potential  Good    PT Frequency  2x / week    PT Duration  6 weeks    PT Treatment/Interventions  ADLs/Self Care Home Management;Aquatic Therapy;Biofeedback;Cryotherapy;Iontophoresis 4mg /ml Dexamethasone;Electrical Stimulation;Moist Heat;Traction;Ultrasound;Parrafin;Fluidtherapy;Contrast Bath;DME Instruction;Gait training;Stair training;Functional mobility training;Therapeutic activities;Therapeutic exercise;Balance training;Neuromuscular re-education;Patient/family education;Orthotic Fit/Training;Manual techniques;Compression bandaging;Scar mobilization;Passive range of motion;Dry needling;Energy conservation;Taping    PT Next Visit Plan  Progress therapeutic exercise as appropriate; stair and gait training; manual therapy for improved DF/PF and great toe extension    PT Home Exercise Plan  R ankle AROM DF/PF/Inv/Ev with yellow tband, icing, ambulating with proper gait mechanics as documented above, massage R foot/ankle for decreased swelling    Consulted and Agree with Plan of Care   Patient       Patient will benefit from skilled therapeutic intervention in order to improve the following deficits and impairments:  Abnormal gait, Decreased activity tolerance, Decreased balance, Decreased endurance, Decreased knowledge of precautions, Decreased knowledge of use of DME, Decreased mobility, Decreased range of motion, Decreased safety awareness, Decreased scar mobility, Decreased strength, Difficulty walking, Hypomobility, Increased edema, Increased fascial restricitons, Increased muscle spasms, Impaired perceived functional ability, Impaired flexibility, Improper body mechanics, Postural dysfunction, Obesity, Pain  Visit Diagnosis: Muscle weakness (generalized)     Problem List Patient Active Problem List   Diagnosis Date Noted  . Post-op pain 12/01/2016  . 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 Flat foot 01/28/2016  . Aortic sclerosis 10/14/2015  . Midline low back pain without sciatica 09/20/2015  . Leukocytosis 11/29/2014  . Osteoarthritis of left knee 11/20/2014  . History of ventral hernia repair 10/27/2014  . Allergic rhinitis 08/17/2014  . Axillary hidradenitis suppurativa 08/17/2014  . Baker cyst 08/17/2014  . Chronic constipation 08/17/2014  . 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 Engages in travel abroad 08/17/2014  . Fibromyalgia 08/17/2014  . Cardiac murmur 08/17/2014  . Anemia, iron deficiency 08/17/2014  . Excessive and frequent menstruation 08/17/2014  . Dysmetabolic syndrome 08/17/2014  . Plantar fasciitis 08/17/2014  . Beat, premature ventricular 08/17/2014  . 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 Obstructive apnea 12/02/2013  . Essential (primary) hypertension 12/09/2009  . Hypertrichosis 12/09/2009  . Extreme obesity 12/09/2009   Lynnea Maizes PT, DPT   Raianna Slight 02/26/2017, 5:26 PM  [facility] PHYS[facility] SPORTS  MEDICINE [address] [place], [place], [address] Phone: [phone]   Fax:  [phone]  Name: Kathleen Conley MRN: [id number] Date of Birth: 10/17/1970

## 2017-03-02 ENCOUNTER — Ambulatory Visit: Payer: 59

## 2017-03-02 DIAGNOSIS — M6281 Muscle weakness (generalized): Secondary | ICD-10-CM | POA: Diagnosis not present

## 2017-03-02 DIAGNOSIS — R2681 Unsteadiness on feet: Secondary | ICD-10-CM | POA: Diagnosis not present

## 2017-03-02 DIAGNOSIS — R2689 Other abnormalities of gait and mobility: Secondary | ICD-10-CM | POA: Diagnosis not present

## 2017-03-02 DIAGNOSIS — M25571 Pain in right ankle and joints of right foot: Secondary | ICD-10-CM

## 2017-03-02 NOTE — Therapy (Signed)
[facility] MEDICINE [address] [place], [address] Phone: [phone]   Fax:  [phone]  Physical Therapy Treatment  Patient Details  Name: Kathleen Conley MRN: [id number] Date of Birth: Dec 19, 1970 Referring Provider: [name]   Encounter Date: 03/02/2017  PT End of Session - 03/02/17 1055    Visit Number  7    Number of Visits  13    Date for PT Re-Evaluation  03/20/17    PT Start Time  1050    PT Stop Time  1130    PT Time Calculation (min)  40 min    Equipment Utilized During Treatment  Other (comment) R boot, crutches    Activity Tolerance  Patient tolerated treatment well    Behavior During Therapy  [facility] for tasks assessed/performed       Past Medical History:  Diagnosis Date  . 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 Allergy   . Anemia    Iron Deficiency  . Arthritis   . Baker cyst   . Breast mass, right 01/18/2010  . Cardiac murmur   . Chronic constipation   . Depression   . Dysmetabolic syndrome   . Fibromyalgia   . Hairiness   . Hydradenitis   . Hypertension   . Incarcerated ventral hernia 09/29/2011  . Obesity   . Obstructive sleep apnea    no CPAP  . Plantar fasciitis   . PVC (premature ventricular contraction)     Past Surgical History:  Procedure Laterality Date  . FOOT ARTHRODESIS Right 12/01/2016   Procedure: ARTHRODESIS FOOT; TRIPLE;  Surgeon: [name], DPM;  Location: [facility] ORS;  Service: Podiatry;  Laterality: Right;  . HERNIA REPAIR  10/09/11   Ventral Incarcerated- Dr. [name]  . OOPHORECTOMY Left 12/09/2009  . TUBAL LIGATION  1993  . VENTRAL HERNIA REPAIR  10/09/2011    There were no vitals filed for this visit.  Subjective Assessment - 03/02/17 1047    Subjective  Pt states that she is doing well today. Denies pain upon arrival. She reports that she initiated the home exercises with the theraband without issue. She  reported some mild aching with exercises but no sharp pain. No specific questions or concerns at this time.     Pertinent History  Pt is a 47 y/o F s/p reconstruction of R foot and ankle. 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 On 12/01/16 pt underwent triple arthrodesis RLE, percutaneous teno achilles lengthening RLE. Pt with soft splint R ankle following surgery and was NWB. Pt using knee walker in hospital due to poor balance with RW. Pt had appointment with Dr. Ether Griffins on 12/4 who instructed pt to begin [facility] starting with 25% and increasing thereafter. Pt reports she was instructed to WBAT with RW or crutches. Pt used to walk 1-2 miles prior to surgery for exercise before pain became too severe.  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  Pt has been off work since end of September 2018, Dr. Ether Griffins suspects potentially going back to work mid February depending on progress with PT.  Pt uses crutches in community and RW in her apartment.  Easing factors: sitting down/lying down, tylenol.  Aggravating factors: WBing activities.  Pt has not been using ice or heat.  Pt's therapy goals: return to work, walking for exercise to lose weight, be  able to move more quickly with ADLs and IADLs.  Pt independent with dressing, showering, driving.  MD gave permission to pt to return to driving with tennis shoe.  Pt reports mild pain when driving when transitioning from brake to gas pedal.   Pt denies any falls since the surgery.  Pt denies any change in weight, night sweats.  Pt able to ambulate up to 50 yards max before needing to sit due to significant pain. Pt is anticipating having same surgery on L foot in the future. Pt has custom orthotic in L shoe.     Limitations  Walking;House hold activities;Lifting;Standing    How long can you sit comfortably?  Unlimited    How long can you stand comfortably?  5 minutes    How long can you walk  comfortably?  ~50 yards before significant pain    Diagnostic tests  X-ray post op, see chart     Patient Stated Goals  return to work, return to walking for exercise, be able to complete her ADLs and IADLs more quickly.    Currently in Pain?  No/denies          TREATMENT  Manual Therapy   Moist heat pack applied to R foot/ankle during history and HEP review x 5 minutes; Long sitting dorsiflexion stretch with manual pressure from therapist 30s hold x 3; Grade II-III talocrural AP and PA mobilizations at end range DF and PF respectively, 30s/bout x 3 bouts each; Grade IV PA mobilizations proximal phalange on first metatarsal into great toe extension x 4 bouts, 30" per bout; Grade IV AP mobilizations  first metatarsal on proximal phalange into great toe extension x 4 bouts, 30" per bout; Scar massage performed to scar on lateral and dorsal aspects of foot;  Ther-ex  R ankle AROM against light resistance with yellow theraband 2 x 10 each direction for DF, PF, inversion, and eversion. R ankle short foot exercise in sitting with cues and instruction about how to complete. Pt denies pain with all strengthening on this date. Written handout provided to patient with short foot exercise.                     PT Education - 03/02/17 1055    Education provided  Yes    Education Details  Reinforced HEP and issued short foot exercise    Person(s) Educated  Patient    Methods  Explanation    Comprehension  Verbalized understanding       PT Short Term Goals - 02/06/17 1251      PT SHORT TERM GOAL #1   Title  Pt will be independent with HEP for improved carryover between sessions    Time  2    Period  Weeks    Status  New        PT Long Term Goals - 02/06/17 1251      PT LONG TERM GOAL #1   Title  Pt's LEFS score will improve to at least 50/80 to demonstrate improved functional use of RLE    Baseline  29/80    Time  6    Period  Weeks    Status  New      PT  LONG TERM GOAL #2   Title  Pt will be able to ambulate with minimal>no gait abnormalities to return to walking for exercise    Baseline  See Evaluation    Time  6    Period  Weeks  Status  New      PT LONG TERM GOAL #3   Title  Pt will be able to stand for at least 30 minutes without pain for return to work    Baseline  5 minutes    Time  5    Period  Weeks    Status  New      PT LONG TERM GOAL #4   Title  Pt's R ankle AROM will improve to [facility] for improved functional use of RLE    Baseline  See Evaluation    Time  5    Period  Weeks    Status  New      PT LONG TERM GOAL #5   Title  Pt's will improve to at least 1.0 m/s to return to community ambulation    Baseline  0.32 m/s    Time  4    Period  Weeks    Status  New            Plan - 03/02/17 1056    Clinical Impression Statement  Pt does very will with all exercises today demonstrate improved strength/endurance with resisted ankle 4 ways. She actually does surprisingly well with short foot exercise and this is added to her HEP. No complaints of pain during session. She continues to notably lack extension in her R great toe. Dorsiflexion is improving compared to LLE but she is also limited in plantarflexion which was addressed today with mobilizations. Pt encouraged to follow-up as scheduled.     Clinical Presentation  Stable    Clinical Decision Making  Moderate    Rehab Potential  Good    PT Frequency  2x / week    PT Duration  6 weeks    PT Treatment/Interventions  ADLs/Self Care Home Management;Aquatic Therapy;Biofeedback;Cryotherapy;Iontophoresis 4mg /ml Dexamethasone;Electrical Stimulation;Moist Heat;Traction;Ultrasound;Parrafin;Fluidtherapy;Contrast Bath;DME Instruction;Gait training;Stair training;Functional mobility training;Therapeutic activities;Therapeutic exercise;Balance training;Neuromuscular re-education;Patient/family education;Orthotic Fit/Training;Manual techniques;Compression bandaging;Scar  mobilization;Passive range of motion;Dry needling;Energy conservation;Taping    PT Next Visit Plan  Progress therapeutic exercise as appropriate; stair and gait training; manual therapy for improved DF/PF and great toe extension    PT Home Exercise Plan  R ankle AROM DF/PF/Inv/Ev with yellow tband, icing, ambulating with proper gait mechanics as documented above, massage R foot/ankle for decreased swelling    Consulted and Agree with Plan of Care  Patient       Patient will benefit from skilled therapeutic intervention in order to improve the following deficits and impairments:  Abnormal gait, Decreased activity tolerance, Decreased balance, Decreased endurance, Decreased knowledge of precautions, Decreased knowledge of use of DME, Decreased mobility, Decreased range of motion, Decreased safety awareness, Decreased scar mobility, Decreased strength, Difficulty walking, Hypomobility, Increased edema, Increased fascial restricitons, Increased muscle spasms, Impaired perceived functional ability, Impaired flexibility, Improper body mechanics, Postural dysfunction, Obesity, Pain  Visit Diagnosis: Muscle weakness (generalized)  Pain in right ankle and joints of right foot     Problem List Patient Active Problem List   Diagnosis Date Noted  . Post-op pain 12/01/2016  . 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 Flat foot 01/28/2016  . Aortic sclerosis 10/14/2015  . Midline low back pain without sciatica 09/20/2015  . Leukocytosis 11/29/2014  . Osteoarthritis of left knee 11/20/2014  . History of ventral hernia repair 10/27/2014  . Allergic rhinitis 08/17/2014  . Axillary hidradenitis suppurativa 08/17/2014  . Baker cyst 08/17/2014  . Chronic constipation 08/17/2014  . 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 Engages  in travel abroad 08/17/2014  . Fibromyalgia 08/17/2014  . Cardiac murmur 08/17/2014  . Anemia, iron deficiency 08/17/2014  . Excessive and frequent  menstruation 08/17/2014  . Dysmetabolic syndrome 08/17/2014  . Plantar fasciitis 08/17/2014  . Beat, premature ventricular 08/17/2014  . 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 Obstructive apnea 12/02/2013  . Essential (primary) hypertension 12/09/2009  . Hypertrichosis 12/09/2009  . Extreme obesity 12/09/2009   Lynnea MaizesJason D Huprich PT, DPT   Huprich,Jason 03/02/2017, 11:54 AM  [facility] [address] [place], [place], [id number] Phone: [phone]   Fax:  [phone]  Name: Kathleen Conley MRN: [id number] Date of Birth: Feb 18, 1971

## 2017-03-05 ENCOUNTER — Ambulatory Visit: Payer: 59 | Admitting: Physical Therapy

## 2017-03-05 DIAGNOSIS — M25571 Pain in right ankle and joints of right foot: Secondary | ICD-10-CM

## 2017-03-05 DIAGNOSIS — R2681 Unsteadiness on feet: Secondary | ICD-10-CM | POA: Diagnosis not present

## 2017-03-05 DIAGNOSIS — M6281 Muscle weakness (generalized): Secondary | ICD-10-CM | POA: Diagnosis not present

## 2017-03-05 DIAGNOSIS — R2689 Other abnormalities of gait and mobility: Secondary | ICD-10-CM | POA: Diagnosis not present

## 2017-03-05 NOTE — Therapy (Signed)
[facility] PH[facility]ORTS MEDICINE [address], [place], [address] Phone: [phone]   Fax:  [phone]  Physical Therapy Treatment  Patient Details  Name: Kathleen Conley MRN: [id number] Date of Birth: [birth year] Referring Provider: [name]   Encounter Date: 03/05/2017  PT End of Session - 03/05/17 1310    Visit Number  8    Number of Visits  13    Date for PT Re-Evaluation  03/20/17    PT Start Time  1305    PT Stop Time  1343    PT Time Calculation (min)  38 min    Equipment Utilized During Treatment  Other (comment) right cam rocker, 1 crutch     Activity Tolerance  Patient tolerated treatment well;No increased pain    Behavior During Therapy  WFL for tasks assessed/performed       Past Medical History:  Diagnosis Date  . 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 Allergy   . Anemia    Iron Deficiency  . Arthritis   . Baker cyst   . Breast mass, right 01/18/2010  . Cardiac murmur   . Chronic constipation   . Depression   . Dysmetabolic syndrome   . Fibromyalgia   . Hairiness   . Hydradenitis   . Hypertension   . Incarcerated ventral hernia 09/29/2011  . Obesity   . Obstructive sleep apnea    no CPAP  . Plantar fasciitis   . PVC (premature ventricular contraction)     Past Surgical History:  Procedure Laterality Date  . FOOT ARTHRODESIS Right 12/01/2016   Procedure: ARTHRODESIS FOOT; TRIPLE;  Surgeon: [name], DPM;  Location: [facility] ORS;  Service: Podiatry;  Laterality: Right;  . HERNIA REPAIR  10/09/11   Ventral Incarcerated- Dr. [name]  . OOPHORECTOMY Left 12/09/2009  . TUBAL LIGATION  1993  . VENTRAL HERNIA REPAIR  10/09/2011    There were no vitals filed for this visit.  Subjective Assessment - 03/05/17 1309    Subjective  Pt reports she is doing ok today. She is still working on [employer] diligently. She began he rnew HEP items over the weekend without  complaint. She stil has pain in the lateral incision.     Pertinent History  Pt is a 47 y/o F s/p reconstruction of R foot and ankle. 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 On 12/01/16 pt underwent triple arthrodesis RLE, percutaneous teno achilles lengthening RLE. Pt with soft splint R ankle following surgery and was NWB. Pt using knee walker in hospital due to poor balance with RW. Pt had appointment with Dr. Ether Griffins on 12/4 who instructed pt to begin [facility] starting with 25% and increasing thereafter. Pt reports she was instructed to WBAT with RW or crutches. Pt used to walk 1-2 miles prior to surgery for exercise before pain became too severe.  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  Pt has been off work since end of September 2018, Dr. Ether Griffins suspects potentially going back to work mid February depending on progress with PT.  Pt uses crutches in community and RW in her apartment.  Easing factors: sitting down/lying down, tylenol.  Aggravating factors: WBing activities.  Pt has not been using ice or heat.  Pt's therapy goals: return to work, walking for exercise to lose weight, be able to move more quickly with  ADLs and IADLs.  Pt independent with dressing, showering, driving.  MD gave permission to pt to return to driving with tennis shoe.  Pt reports mild pain when driving when transitioning from brake to gas pedal.   Pt denies any falls since the surgery.  Pt denies any change in weight, night sweats.  Pt able to ambulate up to 50 yards max before needing to sit due to significant pain. Pt is anticipating having same surgery on L foot in the future. Pt has custom orthotic in L shoe.     Currently in Pain?  Yes    Pain Score  5     Pain Location  -- lateral right foot incision    Pain Orientation  Right    Pain Descriptors / Indicators  Throbbing    Pain Type  Chronic pain         Intervention this  date: Therapeutic Exercise -Seated heel slides, 25x; AA/ROM PF/DF 3sH stretch on each end, VC for flat foot -Seated toe-in/toe-out slides on towel, 25x each direction -Seated Dorsiflexion (starting at end-range PF): 10x3sH  -Seated Plantar Flexion, knee flexed 1x10 (requires modA for AA/ROM)  -Long sitting Plantar flexion Right 1x10 c YTB, VC for full available range Manual Therapy  -long axis ankle distraction x 3 minutes, Right  -Rt Hallux MTP dorsal glide 3x30sec in dorsiflexon -Rt ankle PA talocrural mobilization 3x30sec (to improve plantar flexion) Grade IV Education Offered 2 alternatives for hallux extension stretch to avoid overextension of IP joint. One in [facility] and the other in [facility].             PT Short Term Goals - 02/06/17 1251      PT SHORT TERM GOAL #1   Title  Pt will be independent with HEP for improved carryover between sessions    Time  2    Period  Weeks    Status  New        PT Long Term Goals - 02/06/17 1251      PT LONG TERM GOAL #1   Title  Pt's LEFS score will improve to at least 50/80 to demonstrate improved functional use of RLE    Baseline  29/80    Time  6    Period  Weeks    Status  New      PT LONG TERM GOAL #2   Title  Pt will be able to ambulate with minimal>no gait abnormalities to return to walking for exercise    Baseline  See Evaluation    Time  6    Period  Weeks    Status  New      PT LONG TERM GOAL #3   Title  Pt will be able to stand for at least 30 minutes without pain for return to work    Baseline  5 minutes    Time  5    Period  Weeks    Status  New      PT LONG TERM GOAL #4   Title  Pt's R ankle AROM will improve to [facility] for improved functional use of RLE    Baseline  See Evaluation    Time  5    Period  Weeks    Status  New      PT LONG TERM GOAL #5   Title  Pt's will improve to at least 1.0 m/s to return to community ambulation    Baseline  0.32 m/s    Time  4    Period  Weeks    Status  New             Plan - 03/05/17 1311    Clinical Impression Statement  Continued with currentprogram focus on gentle progression of ROM, improving activattion of ankle and foot muscles. Pt tolerates session well with minimal aggravation of pain, albeit the lateral incision site remain sore. Pt requires minimal VC for performance, but does require high levels of focus to perform correct movements d/t gross motor deficits. Pt making good progress toward goals overall. Plantar flexors remain very limited in strength. Careful with Hallux extension stretch as IP joint is likely beginning to hyperextend with pain in the area. Hallux mobillity appears to be mor emediated by tightness in long-flexor tissue, rather than  joint mobility.     Rehab Potential  Good    PT Frequency  2x / week    PT Duration  6 weeks    PT Treatment/Interventions  ADLs/Self Care Home Management;Aquatic Therapy;Biofeedback;Cryotherapy;Iontophoresis 4mg /ml Dexamethasone;Electrical Stimulation;Moist Heat;Traction;Ultrasound;Parrafin;Fluidtherapy;Contrast Bath;DME Instruction;Gait training;Stair training;Functional mobility training;Therapeutic activities;Therapeutic exercise;Balance training;Neuromuscular re-education;Patient/family education;Orthotic Fit/Training;Manual techniques;Compression bandaging;Scar mobilization;Passive range of motion;Dry needling;Energy conservation;Taping    PT Next Visit Plan  Progress therapeutic exercise as appropriate; stair and gait training; manual therapy for improved DF/PF and great toe extension    PT Home Exercise Plan  R ankle AROM DF/PF/Inv/Ev with yellow tband, icing, ambulating with proper gait mechanics as documented above, massage R foot/ankle for decreased swelling    Consulted and Agree with Plan of Care  Patient       Patient will benefit from skilled therapeutic intervention in order to improve the following deficits and impairments:  Abnormal gait, Decreased activity tolerance, Decreased  balance, Decreased endurance, Decreased knowledge of precautions, Decreased knowledge of use of DME, Decreased mobility, Decreased range of motion, Decreased safety awareness, Decreased scar mobility, Decreased strength, Difficulty walking, Hypomobility, Increased edema, Increased fascial restricitons, Increased muscle spasms, Impaired perceived functional ability, Impaired flexibility, Improper body mechanics, Postural dysfunction, Obesity, Pain  Visit Diagnosis: Muscle weakness (generalized)  Pain in right ankle and joints of right foot  Unsteadiness on feet  Other abnormalities of gait and mobility     Problem List Patient Active Problem List   Diagnosis Date Noted  . Post-op pain 12/01/2016  . 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 Flat foot 01/28/2016  . Aortic sclerosis 10/14/2015  . Midline low back pain without sciatica 09/20/2015  . Leukocytosis 11/29/2014  . Osteoarthritis of left knee 11/20/2014  . History of ventral hernia repair 10/27/2014  . Allergic rhinitis 08/17/2014  . Axillary hidradenitis suppurativa 08/17/2014  . Baker cyst 08/17/2014  . Chronic constipation 08/17/2014  . 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 Engages in travel abroad 08/17/2014  . Fibromyalgia 08/17/2014  . Cardiac murmur 08/17/2014  . Anemia, iron deficiency 08/17/2014  . Excessive and frequent menstruation 08/17/2014  . Dysmetabolic syndrome 08/17/2014  . Plantar fasciitis 08/17/2014  . Beat, premature ventricular 08/17/2014  . 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 Obstructive apnea 12/02/2013  . Essential (primary) hypertension 12/09/2009  . Hypertrichosis 12/09/2009  . Extreme obesity 12/09/2009    Briggs Edelen C 03/05/2017, 1:48 PM  1:52 PM, 03/05/17 Rosamaria Lints, PT, DPT [facility] Physical Therapist - [place] [phone] (Office)  [facility] PHYSICAL AND  SPORTS MEDICINE [address] [place], [address] Phone: [phone]   Fax:  [phone]  Name: Lanora Manis  [place] MRN: [id number] Date of Birth: 1970/07/13

## 2017-03-06 DIAGNOSIS — M79671 Pain in right foot: Secondary | ICD-10-CM | POA: Diagnosis not present

## 2017-03-06 DIAGNOSIS — M76829 Posterior tibial tendinitis, unspecified leg: Secondary | ICD-10-CM | POA: Diagnosis not present

## 2017-03-09 ENCOUNTER — Ambulatory Visit: Payer: 59

## 2017-03-09 DIAGNOSIS — M25571 Pain in right ankle and joints of right foot: Secondary | ICD-10-CM | POA: Diagnosis not present

## 2017-03-09 DIAGNOSIS — M6281 Muscle weakness (generalized): Secondary | ICD-10-CM

## 2017-03-09 DIAGNOSIS — R2689 Other abnormalities of gait and mobility: Secondary | ICD-10-CM | POA: Diagnosis not present

## 2017-03-09 DIAGNOSIS — R2681 Unsteadiness on feet: Secondary | ICD-10-CM | POA: Diagnosis not present

## 2017-03-09 NOTE — Therapy (Signed)
[facility] [address], [place], [address] Phone: [phone]   Fax:  [phone]  Physical Therapy Treatment  Patient Details  Name: Kathleen Conley MRN: [id number] Date of Birth: 04-Jun-1970 Referring Provider: [name]   Encounter Date: 03/09/2017  PT End of Session - 03/09/17 1056    Visit Number  9    Number of Visits  13    Date for PT Re-Evaluation  03/20/17    PT Start Time  1050    PT Stop Time  1130    PT Time Calculation (min)  40 min    Equipment Utilized During Treatment  Other (comment) right cam rocker, 1 crutch     Activity Tolerance  Patient tolerated treatment well;No increased pain    Behavior During Therapy  WFL for tasks assessed/performed       Past Medical History:  Diagnosis Date  . 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 Allergy   . Anemia    Iron Deficiency  . Arthritis   . Baker cyst   . Breast mass, right 01/18/2010  . Cardiac murmur   . Chronic constipation   . Depression   . Dysmetabolic syndrome   . Fibromyalgia   . Hairiness   . Hydradenitis   . Hypertension   . Incarcerated ventral hernia 09/29/2011  . Obesity   . Obstructive sleep apnea    no CPAP  . Plantar fasciitis   . PVC (premature ventricular contraction)     Past Surgical History:  Procedure Laterality Date  . FOOT ARTHRODESIS Right 12/01/2016   Procedure: ARTHRODESIS FOOT; TRIPLE;  Surgeon: [name], DPM;  Location: [facility] ORS;  Service: Podiatry;  Laterality: Right;  . HERNIA REPAIR  10/09/11   Ventral Incarcerated- Dr. [name]  . OOPHORECTOMY Left 12/09/2009  . TUBAL LIGATION  1993  . VENTRAL HERNIA REPAIR  10/09/2011    There were no vitals filed for this visit.  Subjective Assessment - 03/09/17 1054    Subjective  Pt states that she is doing well on this date. She saw Dr. Ether Griffins and he discontinued her walking boot entirely. She states that  she has no restrictions but she was encouraged to utilize the single axillary crutch until her balance improves. She will follow-up with him next on February 26th. Pt states that Dr. Ether Griffins would like for her to continue therapy for an additional 6 weeks. He would like for her to work on improving her plantarflexion ROM.     Pertinent History  Pt is a 47 y/o F s/p reconstruction of R foot and ankle. 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 On 12/01/16 pt underwent triple arthrodesis RLE, percutaneous teno achilles lengthening RLE. Pt with soft splint R ankle following surgery and was NWB. Pt using knee walker in hospital due to poor balance with RW. Pt had appointment with Dr. Ether Griffins on 12/4 who instructed pt to begin [facility] starting with 25% and increasing thereafter. Pt reports she was instructed to WBAT with RW or crutches. Pt used to walk 1-2 miles prior to surgery for exercise before pain became too severe.  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  Pt has been off work since end of September 2018, Dr. Ether Griffins suspects potentially going back to work mid February depending on progress with PT.  Pt uses crutches in  community and RW in her apartment.  Easing factors: sitting down/lying down, tylenol.  Aggravating factors: WBing activities.  Pt has not been using ice or heat.  Pt's therapy goals: return to work, walking for exercise to lose weight, be able to move more quickly with ADLs and IADLs.  Pt independent with dressing, showering, driving.  MD gave permission to pt to return to driving with tennis shoe.  Pt reports mild pain when driving when transitioning from brake to gas pedal.   Pt denies any falls since the surgery.  Pt denies any change in weight, night sweats.  Pt able to ambulate up to 50 yards max before needing to sit due to significant pain. Pt is anticipating having same surgery on L foot in the future. Pt has  custom orthotic in L shoe.     Currently in Pain?  No/denies        TREATMENT  Manual Therapy  Moist heat pack applied to R foot/ankle during history and HEP review and while pt completing LEFS x 5 minutes; LEFS scored (45/80) and results discussed with patient. Updated goals with patient and obtained ankle ROM measures; Long sitting dorsiflexion stretch with manual pressure from therapist 30s hold x 3; GradeII-IIItalocruralAP and PAmobilizationsat end range DF and PF respectively, 30s/bout x3bouts each; Grade IV dorsalmobilizations proximal phalange on first metatarsal into great toe extension x3bouts,30" per bout;  Ther-ex R ankle AROM against red theraband x 10 each direction for DF, PF, inversion, and eversion. BAPS board level 1 for PF/DF/Inv/Ev followed by clockwise/counterclockwise ankle circumduction; Attempted standing heel raises but pt unable to perform due to weakness; Seated heel raises with manual resistance provided by patient to appropriate challenge 2 x 10 (added to HEP); Standing single leg balance, pt only able to perform for 1-2s at a time on RLE. Pt performed for total of 30s (added to HEP) Pt denies pain with all strengthening on this date Written handout provided to patient with seated heel raises and single leg balance                   PT Education - 03/09/17 1056    Education provided  Yes    Education Details  HEP progressed    Person(s) Educated  Patient    Methods  Explanation    Comprehension  Verbalized understanding       PT Short Term Goals - 03/09/17 1056      PT SHORT TERM GOAL #1   Title  Pt will be independent with HEP for improved carryover between sessions    Time  2    Period  Weeks    Status  On-going    Target Date  04/06/17        PT Long Term Goals - 03/09/17 1056      PT LONG TERM GOAL #1   Title  Pt's LEFS score will improve to at least 50/80 to demonstrate improved functional use of RLE     Baseline  29/80, 03/09/17: 45/80    Time  6    Period  Weeks    Status  On-going    Target Date  05/04/17      PT LONG TERM GOAL #2   Title  Pt will be able to ambulate with minimal>no gait abnormalities to return to walking for exercise    Baseline  See Evaluation; 03/09/17: Pt still ambulates with R antalgic gait, decrease forefoot loading and limited DF on RLE    Time  6    Period  Weeks    Status  On-going    Target Date  05/04/17      PT LONG TERM GOAL #3   Title  Pt will be able to stand for at least 30 minutes without pain for return to work    Baseline  5 minutes, 03/09/17: 10 minutes    Time  5    Period  Weeks    Status  On-going    Target Date  05/04/17      PT LONG TERM GOAL #4   Title  Pt's R ankle AROM will improve to [facility] for improved functional use of RLE    Baseline  See Evaluation, 03/09/17: DF: 5, PF: 45, Inv/Ev: measures not saved/to be rechecked    Time  5    Period  Weeks    Status  New    Target Date  05/04/17      PT LONG TERM GOAL #5   Title  Pt's will improve to at least 1.0 m/s to return to community ambulation    Baseline  0.32 m/s;    Time  4    Period  Weeks    Status  Deferred    Target Date  05/04/17            Plan - 03/09/17 1056    Clinical Impression Statement  Pt does well with therapy today. She is able to progress the resistance with her 4 way ankles to a red theraband. She struggles with proprioceptive control on the BAPS board when introduced this session. She also has exceedingly weak plantarflexion on the RLE and is unable to perform a bilateral heel raise in standing. Added seated heel raises with manual resistance to HEP. Pt also furnished with a red tband. ROM measures are improving but are still not symmetrical to LLE. Pt encouraged to continue additional HEP and follow-up as scheduled.     Rehab Potential  Good    PT Frequency  2x / week    PT Duration  6 weeks    PT Treatment/Interventions  ADLs/Self Care Home  Management;Aquatic Therapy;Biofeedback;Cryotherapy;Iontophoresis 4mg /ml Dexamethasone;Electrical Stimulation;Moist Heat;Traction;Ultrasound;Parrafin;Fluidtherapy;Contrast Bath;DME Instruction;Gait training;Stair training;Functional mobility training;Therapeutic activities;Therapeutic exercise;Balance training;Neuromuscular re-education;Patient/family education;Orthotic Fit/Training;Manual techniques;Compression bandaging;Scar mobilization;Passive range of motion;Dry needling;Energy conservation;Taping    PT Next Visit Plan  Progress therapeutic exercise as appropriate especially ins tanding now that she is out of the boot; stair and gait training; manual therapy for improved DF/PF and great toe extension    PT Home Exercise Plan  R ankle AROM DF/PF/Inv/Ev with red tband, seated heel raises with manual resistance, R single leg balance, icing, ambulating with proper gait mechanics as documented, massage R foot/ankle for decreased swelling    Consulted and Agree with Plan of Care  Patient       Patient will benefit from skilled therapeutic intervention in order to improve the following deficits and impairments:  Abnormal gait, Decreased activity tolerance, Decreased balance, Decreased endurance, Decreased knowledge of precautions, Decreased knowledge of use of DME, Decreased mobility, Decreased range of motion, Decreased safety awareness, Decreased scar mobility, Decreased strength, Difficulty walking, Hypomobility, Increased edema, Increased fascial restricitons, Increased muscle spasms, Impaired perceived functional ability, Impaired flexibility, Improper body mechanics, Postural dysfunction, Obesity, Pain  Visit Diagnosis: Muscle weakness (generalized)  Pain in right ankle and joints of right foot     Problem List Patient Active Problem List   Diagnosis Date Noted  . Post-op pain 12/01/2016  . Sinus tarsi  syndrome of right foot 01/28/2016  . Flat foot 01/28/2016  . Aortic sclerosis 10/14/2015   . Midline low back pain without sciatica 09/20/2015  . Leukocytosis 11/29/2014  . Osteoarthritis of left knee 11/20/2014  . History of ventral hernia repair 10/27/2014  . Allergic rhinitis 08/17/2014  . Axillary hidradenitis suppurativa 08/17/2014  . Baker cyst 08/17/2014  . Chronic constipation 08/17/2014  . 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 Engages in travel abroad 08/17/2014  . Fibromyalgia 08/17/2014  . Cardiac murmur 08/17/2014  . Anemia, iron deficiency 08/17/2014  . Excessive and frequent menstruation 08/17/2014  . Dysmetabolic syndrome 08/17/2014  . Plantar fasciitis 08/17/2014  . Beat, premature ventricular 08/17/2014  . 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 Obstructive apnea 12/02/2013  . Essential (primary) hypertension 12/09/2009  . Hypertrichosis 12/09/2009  . Extreme obesity 12/09/2009   Lynnea MaizesJason D Sevan Mcbroom PT, DPT   Julianne Chamberlin 03/09/2017, 1:35 PM  [facility] [address] [place], [place], [address] Phone: [phone]   Fax:  [phone]  Name: Elvera Marializabeth Slutsky MRN: [id number] Date of Birth: 1970-02-24

## 2017-03-12 ENCOUNTER — Ambulatory Visit: Payer: 59 | Admitting: Physical Therapy

## 2017-03-12 DIAGNOSIS — R2689 Other abnormalities of gait and mobility: Secondary | ICD-10-CM | POA: Diagnosis not present

## 2017-03-12 DIAGNOSIS — M25571 Pain in right ankle and joints of right foot: Secondary | ICD-10-CM

## 2017-03-12 DIAGNOSIS — M6281 Muscle weakness (generalized): Secondary | ICD-10-CM | POA: Diagnosis not present

## 2017-03-12 DIAGNOSIS — R2681 Unsteadiness on feet: Secondary | ICD-10-CM

## 2017-03-12 NOTE — Therapy (Signed)
[facility] [address], [place], [address] Phone: [phone]   Fax:  [phone]  Physical Therapy Treatment  Patient Details  Name: Kathleen Conley MRN: [id number] Date of Birth: 1970-12-13 Referring Provider: [name]   Encounter Date: 03/12/2017  PT End of Session - 03/12/17 1544    Visit Number  10    Number of Visits  13    Date for PT Re-Evaluation  03/20/17    PT Start Time  1516    PT Stop Time  1557    PT Time Calculation (min)  41 min       Past Medical History:  Diagnosis Date  . 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 Allergy   . Anemia    Iron Deficiency  . Arthritis   . Baker cyst   . Breast mass, right 01/18/2010  . Cardiac murmur   . Chronic constipation   . Depression   . Dysmetabolic syndrome   . Fibromyalgia   . Hairiness   . Hydradenitis   . Hypertension   . Incarcerated ventral hernia 09/29/2011  . Obesity   . Obstructive sleep apnea    no CPAP  . Plantar fasciitis   . PVC (premature ventricular contraction)     Past Surgical History:  Procedure Laterality Date  . FOOT ARTHRODESIS Right 12/01/2016   Procedure: ARTHRODESIS FOOT; TRIPLE;  Surgeon: [name], DPM;  Location: [facility] ORS;  Service: Podiatry;  Laterality: Right;  . HERNIA REPAIR  10/09/11   Ventral Incarcerated- Dr. [name]  . OOPHORECTOMY Left 12/09/2009  . TUBAL LIGATION  1993  . VENTRAL HERNIA REPAIR  10/09/2011    There were no vitals filed for this visit.  Subjective Assessment - 03/12/17 1521    Subjective  Pt reports she is doing well thus far. She reports no pain today. She is getting through her HEP with success. Her referring provider is pleased with pogress but reports concerns with balance.     Pertinent History  Pt is a 47 y/o F s/p reconstruction of R foot and ankle. 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 On  12/01/16 pt underwent triple arthrodesis RLE, percutaneous teno achilles lengthening RLE. Pt with soft splint R ankle following surgery and was NWB. Pt using knee walker in hospital due to poor balance with RW. Pt had appointment with Dr. Ether Griffins on 12/4 who instructed pt to begin [facility] starting with 25% and increasing thereafter. Pt reports she was instructed to WBAT with RW or crutches. Pt used to walk 1-2 miles prior to surgery for exercise before pain became too severe.  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  Pt has been off work since end of September 2018, Dr. Ether Griffins suspects potentially going back to work mid February depending on progress with PT.  Pt uses crutches in community and RW in her apartment.  Easing factors: sitting down/lying down, tylenol.  Aggravating factors: WBing activities.  Pt has not been using ice or heat.  Pt's therapy goals: return to work, walking for exercise to lose weight, be able to move more quickly with ADLs and IADLs.  Pt independent with dressing, showering, driving.  MD gave permission to pt to return to driving with tennis shoe.  Pt reports mild pain when driving when transitioning from brake to gas pedal.  Pt denies any falls since the surgery.  Pt denies any change in weight, night sweats.  Pt able to ambulate up to 50 yards max before needing to sit due to significant pain. Pt is anticipating having same surgery on L foot in the future. Pt has custom orthotic in L shoe.     Currently in Pain?  No/denies                Interventions today:  -Seated heel slides x 2 minutes, 3 sec stretch in DF and PF  -Wide and normal stance lateral weight shift, chair for balance PRN -seated PF in closed chain 2x15, 2nd set starting in DF, balls of feet on stool -seated Ankle DF in closed chain 2x15   Neuromuscular Reeducation:  -Normal stance on airex pad: 1x30sec (increased ankle intrinsics, but not  challenging to stability)  -Narrow stance on Airex 10x10sec -normal stance on banked flat surface, 2x15sec right/left; 2x15sec incline/decline -Turning in place: 5x360 degrees bilat alternating directions -retro AMB 2x28ft            PT Short Term Goals - 03/09/17 1056      PT SHORT TERM GOAL #1   Title  Pt will be independent with HEP for improved carryover between sessions    Time  2    Period  Weeks    Status  On-going    Target Date  04/06/17        PT Long Term Goals - 03/09/17 1056      PT LONG TERM GOAL #1   Title  Pt's LEFS score will improve to at least 50/80 to demonstrate improved functional use of RLE    Baseline  29/80, 03/09/17: 45/80    Time  6    Period  Weeks    Status  On-going    Target Date  05/04/17      PT LONG TERM GOAL #2   Title  Pt will be able to ambulate with minimal>no gait abnormalities to return to walking for exercise    Baseline  See Evaluation; 03/09/17: Pt still ambulates with R antalgic gait, decrease forefoot loading and limited DF on RLE    Time  6    Period  Weeks    Status  On-going    Target Date  05/04/17      PT LONG TERM GOAL #3   Title  Pt will be able to stand for at least 30 minutes without pain for return to work    Baseline  5 minutes, 03/09/17: 10 minutes    Time  5    Period  Weeks    Status  On-going    Target Date  05/04/17      PT LONG TERM GOAL #4   Title  Pt's R ankle AROM will improve to [facility] for improved functional use of RLE    Baseline  See Evaluation, 03/09/17: DF: 5, PF: 45, Inv/Ev: measures not saved/to be rechecked    Time  5    Period  Weeks    Status  New    Target Date  05/04/17      PT LONG TERM GOAL #5   Title  Pt's will improve to at least 1.0 m/s to return to community ambulation    Baseline  0.32 m/s;    Time  4    Period  Weeks    Status  Deferred    Target Date  05/04/17  Plan - 03/12/17 1546    Clinical Impression Statement  Pt tolerating session well  today. Progressed balance to unstable surfaces, as well as slanted surfaces. Ankle remains more limited by weakness and less by stiffness, which limits ability to perform single leg activity. Pt making good progress toward goals overall,     Rehab Potential  Good    PT Frequency  2x / week    PT Duration  6 weeks    PT Treatment/Interventions  ADLs/Self Care Home Management;Aquatic Therapy;Biofeedback;Cryotherapy;Iontophoresis 4mg /ml Dexamethasone;Electrical Stimulation;Moist Heat;Traction;Ultrasound;Parrafin;Fluidtherapy;Contrast Bath;DME Instruction;Gait training;Stair training;Functional mobility training;Therapeutic activities;Therapeutic exercise;Balance training;Neuromuscular re-education;Patient/family education;Orthotic Fit/Training;Manual techniques;Compression bandaging;Scar mobilization;Passive range of motion;Dry needling;Energy conservation;Taping    PT Next Visit Plan  Progress therapeutic exercise as appropriate especially ins tanding now that she is out of the boot; stair and gait training; manual therapy for improved DF/PF and great toe extension    PT Home Exercise Plan  R ankle AROM DF/PF/Inv/Ev with red tband, seated heel raises with manual resistance, R single leg balance, icing, ambulating with proper gait mechanics as documented, massage R foot/ankle for decreased swelling       Patient will benefit from skilled therapeutic intervention in order to improve the following deficits and impairments:  Abnormal gait, Decreased activity tolerance, Decreased balance, Decreased endurance, Decreased knowledge of precautions, Decreased knowledge of use of DME, Decreased mobility, Decreased range of motion, Decreased safety awareness, Decreased scar mobility, Decreased strength, Difficulty walking, Hypomobility, Increased edema, Increased fascial restricitons, Increased muscle spasms, Impaired perceived functional ability, Impaired flexibility, Improper body mechanics, Postural dysfunction,  Obesity, Pain  Visit Diagnosis: Muscle weakness (generalized)  Pain in right ankle and joints of right foot  Unsteadiness on feet  Other abnormalities of gait and mobility     Problem List Patient Active Problem List   Diagnosis Date Noted  . Post-op pain 12/01/2016  . 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 Flat foot 01/28/2016  . Aortic sclerosis 10/14/2015  . Midline low back pain without sciatica 09/20/2015  . Leukocytosis 11/29/2014  . Osteoarthritis of left knee 11/20/2014  . History of ventral hernia repair 10/27/2014  . Allergic rhinitis 08/17/2014  . Axillary hidradenitis suppurativa 08/17/2014  . Baker cyst 08/17/2014  . Chronic constipation 08/17/2014  . 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 Engages in travel abroad 08/17/2014  . Fibromyalgia 08/17/2014  . Cardiac murmur 08/17/2014  . Anemia, iron deficiency 08/17/2014  . Excessive and frequent menstruation 08/17/2014  . Dysmetabolic syndrome 08/17/2014  . Plantar fasciitis 08/17/2014  . Beat, premature ventricular 08/17/2014  . 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 Obstructive apnea 12/02/2013  . Essential (primary) hypertension 12/09/2009  . Hypertrichosis 12/09/2009  . Extreme obesity 12/09/2009   3:57 PM, 03/12/17 Rosamaria LintsAllan C Buccola, PT, DPT [facility] - [place] [phone] (Office)   Buccola,Allan C 03/12/2017, 3:56 PM   [facility] [address] [place], [place], [address] Phone: [phone]   Fax:  [phone]  Name: Kathleen Conley MRN: [id number] Date of Birth: 1970/08/30

## 2017-03-14 ENCOUNTER — Encounter: Payer: Self-pay | Admitting: Physical Therapy

## 2017-03-14 ENCOUNTER — Ambulatory Visit: Payer: 59 | Admitting: Physical Therapy

## 2017-03-14 DIAGNOSIS — M25571 Pain in right ankle and joints of right foot: Secondary | ICD-10-CM | POA: Diagnosis not present

## 2017-03-14 DIAGNOSIS — R2681 Unsteadiness on feet: Secondary | ICD-10-CM | POA: Diagnosis not present

## 2017-03-14 DIAGNOSIS — R2689 Other abnormalities of gait and mobility: Secondary | ICD-10-CM | POA: Diagnosis not present

## 2017-03-14 DIAGNOSIS — M6281 Muscle weakness (generalized): Secondary | ICD-10-CM | POA: Diagnosis not present

## 2017-03-14 NOTE — Therapy (Addendum)
[place] [facility] PH[facility]ORTS MEDICINE [address], [place], [address] Phone: [phone]   Fax:  [phone]  Physical Therapy Treatment  Patient Details  Name: Kathleen Conley MRN: [id number] Date of Birth: 12/31/1970 Referring Provider: [name]   Encounter Date: 03/14/2017    Past Medical History:  Diagnosis Date  . 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 Allergy   . Anemia    Iron Deficiency  . Arthritis   . Baker cyst   . Breast mass, right 01/18/2010  . Cardiac murmur   . Chronic constipation   . Depression   . Dysmetabolic syndrome   . Fibromyalgia   . Hairiness   . Hydradenitis   . Hypertension   . Incarcerated ventral hernia 09/29/2011  . Obesity   . Obstructive sleep apnea    no CPAP  . Plantar fasciitis   . PVC (premature ventricular contraction)     Past Surgical History:  Procedure Laterality Date  . FOOT ARTHRODESIS Right 12/01/2016   Procedure: ARTHRODESIS FOOT; TRIPLE;  Surgeon: [name], DPM;  Location: [facility] ORS;  Service: Podiatry;  Laterality: Right;  . HERNIA REPAIR  10/09/11   Ventral Incarcerated- Dr. [name]  . OOPHORECTOMY Left 12/09/2009  . TUBAL LIGATION  1993  . VENTRAL HERNIA REPAIR  10/09/2011    There were no vitals filed for this visit.  Subjective Assessment - 03/14/17 1620    Subjective  Patient reports no pain since last session. She reports compliance with her HEP and that she feels as though her balance exercise is getting a "tiny bit" easier    Pertinent History  Pt is a 47 y/o F s/p reconstruction of R foot and ankle. 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 On 12/01/16 pt underwent triple arthrodesis RLE, percutaneous teno achilles lengthening RLE. Pt with soft splint R ankle following surgery and was NWB. Pt using knee walker in hospital due to poor balance with RW. Pt had appointment with Dr. Ether Griffins on  12/4 who instructed pt to begin [facility] starting with 25% and increasing thereafter. Pt reports she was instructed to WBAT with RW or crutches. Pt used to walk 1-2 miles prior to surgery for exercise before pain became too severe.  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  Pt has been off work since end of September 2018, Dr. Ether Griffins suspects potentially going back to work mid February depending on progress with PT.  Pt uses crutches in community and RW in her apartment.  Easing factors: sitting down/lying down, tylenol.  Aggravating factors: WBing activities.  Pt has not been using ice or heat.  Pt's therapy goals: return to work, walking for exercise to lose weight, be able to move more quickly with ADLs and IADLs.  Pt independent with dressing, showering, driving.  MD gave permission to pt to return to driving with tennis shoe.  Pt reports mild pain when driving when transitioning from brake to gas pedal.   Pt denies any falls since the surgery.  Pt denies any change in weight, night sweats.  Pt able to ambulate up to 50 yards max before needing to sit due to significant pain. Pt is anticipating having same surgery on L foot in the future. Pt has custom orthotic in L shoe.     Limitations  Walking;House hold activities;Lifting;Standing    How long  can you sit comfortably?  Unlimited    How long can you stand comfortably?  5 minutes    How long can you walk comfortably?  ~50 yards before significant pain    Diagnostic tests  X-ray post op, see chart     Patient Stated Goals  return to work, return to walking for exercise, be able to complete her ADLs and IADLs more quickly.    Currently in Pain?  Yes    Pain Score  0-No pain    Pain Onset  More than a month ago              There-Ex  -Seated alternating PF/DF stretch sliding foot on aero step holding 5sec in each position 30x each position -BAPS (middle level) PF to float position 3x  15 -BAPS(middle level) clockwise/couterclockwise ankle roll touching rim 15 rotations each way -BAPS(middle level)  clockwise/couterclockwise ankle roll touching floating board 15 rotations each way x2 repetitions **demonstration and VC needed for BAPS technique ** -Seated R heel raises with PT giving manual resistance for concentric and eccentric phase 3x10 (beginning with no resistance to establish motor pattern -1x 10 standing bilat heel raises with BUE support at treadmill  Therapeutic Activity: -PT manually assisted patient with proper gait focusing on tibial translation and GT "push off". Patient required tactile cuing for ankle movement and verbal cuing to prevent knee hyperext and to encourage knee bending throughout gait cycle. Patient ambulated 11ft through gym to/from various therapy equipment and improved gait 50% by the end of session -Wt shift to R treadmill bar with bilateral handrail support with 30 sec holds progressing to lifting L LE with bilateral handrail support 1x 15sec hold > lifting L LE with 2 finger bilat UE support 1x 15sec hold > lifting L LE with 1 finger bilat support 1x 30sec hold >  lifting L LE with 1 finger R UE support 2x 30sec hold -PT attempted SLS w/o UE support which pt was able to hold for 4sec and then 6sec **Patient advised to use the weight shift + "float" of L LE method with her balancing exercise at home**                   PT Short Term Goals - 03/09/17 1056      PT SHORT TERM GOAL #1   Title  Pt will be independent with HEP for improved carryover between sessions    Time  2    Period  Weeks    Status  On-going    Target Date  04/06/17        PT Long Term Goals - 03/09/17 1056      PT LONG TERM GOAL #1   Title  Pt's LEFS score will improve to at least 50/80 to demonstrate improved functional use of RLE    Baseline  29/80, 03/09/17: 45/80    Time  6    Period  Weeks    Status  On-going    Target Date  05/04/17      PT  LONG TERM GOAL #2   Title  Pt will be able to ambulate with minimal>no gait abnormalities to return to walking for exercise    Baseline  See Evaluation; 03/09/17: Pt still ambulates with R antalgic gait, decrease forefoot loading and limited DF on RLE    Time  6    Period  Weeks    Status  On-going    Target Date  05/04/17  PT LONG TERM GOAL #3   Title  Pt will be able to stand for at least 30 minutes without pain for return to work    Baseline  5 minutes, 03/09/17: 10 minutes    Time  5    Period  Weeks    Status  On-going    Target Date  05/04/17      PT LONG TERM GOAL #4   Title  Pt's R ankle [facility]OM [facility] for improved functional use of RLE    Baseline  See Evaluation, 03/09/17: DF: 5, PF: 45, Inv/Ev: measures not saved/to be rechecked    Time  5    Period  Weeks    Status  New    Target Date  05/04/17      PT LONG TERM GOAL #5   Title  Pt's 10mWT will improve to at least 1.0 m/s to return to community ambulation    Baseline  0.32 m/s;    Time  4    Period  Weeks    Status  Deferred    Target Date  05/04/17            Plan - 03/14/17 1728    Clinical Impression Statement  Pt tolerated session well today. During heel raise exercise patient required tactile cues for GT flexion to mimic "push off" for gait. while walking through gym PT noticed patient hyperextends at the knees and everts the ankle for foot clearance and attempted to correct this teaching proper gait mechanics, focusing on tibial translation and GT push off- which patient tolerated well. Following patient was able to complete a set of standing heel raises with proper ankle/foot  form before becoming too fatigued. PT progressed balance exercise toward SLS which patient tolerated well, and was quite satisfied with.     Rehab Potential  Good    PT Frequency  2x / week    PT Duration  6 weeks    PT Treatment/Interventions  ADLs/Self Care Home Management;Aquatic  Therapy;Biofeedback;Cryotherapy;Iontophoresis 4mg /ml Dexamethasone;Electrical Stimulation;Moist Heat;Traction;Ultrasound;Parrafin;Fluidtherapy;Contrast Bath;DME Instruction;Gait training;Stair training;Functional mobility training;Therapeutic activities;Therapeutic exercise;Balance training;Neuromuscular re-education;Patient/family education;Orthotic Fit/Training;Manual techniques;Compression bandaging;Scar mobilization;Passive range of motion;Dry needling;Energy conservation;Taping    PT Next Visit Plan  Progress therex and balance as tolerated; reassess gait as needed       Patient will benefit from skilled therapeutic intervention in order to improve the following deficits and impairments:  Abnormal gait, Decreased activity tolerance, Decreased balance, Decreased endurance, Decreased knowledge of precautions, Decreased knowledge of use of DME, Decreased mobility, Decreased range of motion, Decreased safety awareness, Decreased scar mobility, Decreased strength, Difficulty walking, Hypomobility, Increased edema, Increased fascial restricitons, Increased muscle spasms, Impaired perceived functional ability, Impaired flexibility, Improper body mechanics, Postural dysfunction, Obesity, Pain  Visit Diagnosis: Pain in right ankle and joints of right foot  Unsteadiness on feet  Muscle weakness (generalized)     Problem List Patient Active Problem List   Diagnosis Date Noted  . Post-op pain 12/01/2016  . 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 Flat foot 01/28/2016  . Aortic sclerosis 10/14/2015  . Midline low back pain without sciatica 09/20/2015  . Leukocytosis 11/29/2014  . Osteoarthritis of left knee 11/20/2014  . History of ventral hernia repair 10/27/2014  . Allergic rhinitis 08/17/2014  . Axillary hidradenitis suppurativa 08/17/2014  . Baker cyst 08/17/2014  . Chronic constipation 08/17/2014  . 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 Engages in travel abroad  08/17/2014  . Fibromyalgia 08/17/2014  .  Cardiac murmur 08/17/2014  . Anemia, iron deficiency 08/17/2014  . Excessive and frequent menstruation 08/17/2014  . Dysmetabolic syndrome 08/17/2014  . Plantar fasciitis 08/17/2014  . Beat, premature ventricular 08/17/2014  . 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 Obstructive apnea 12/02/2013  . Essential (primary) hypertension 12/09/2009  . Hypertrichosis 12/09/2009  . Extreme obesity 12/09/2009    Staci Acosta 03/14/2017, 5:36 PM  [place] [facility] PH[facility] AND SPORTS MEDICINE [address] [place], [address] Phone: [phone]   Fax:  [phone]  Name: Nima Bamburg MRN: [id number] Date of Birth: [birth year]

## 2017-03-19 ENCOUNTER — Ambulatory Visit: Payer: 59 | Admitting: Physical Therapy

## 2017-03-19 ENCOUNTER — Encounter: Payer: Self-pay | Admitting: Physical Therapy

## 2017-03-19 DIAGNOSIS — R2681 Unsteadiness on feet: Secondary | ICD-10-CM | POA: Diagnosis not present

## 2017-03-19 DIAGNOSIS — M25571 Pain in right ankle and joints of right foot: Secondary | ICD-10-CM | POA: Diagnosis not present

## 2017-03-19 DIAGNOSIS — R2689 Other abnormalities of gait and mobility: Secondary | ICD-10-CM | POA: Diagnosis not present

## 2017-03-19 DIAGNOSIS — M6281 Muscle weakness (generalized): Secondary | ICD-10-CM | POA: Diagnosis not present

## 2017-03-19 NOTE — Therapy (Signed)
[place] [facility]TS MEDICINE [address], [place], [address] Phone: [phone]   Fax:  [phone]  Physical Therapy Treatment  Patient Details  Name: Kathleen Conley MRN: [id number] Date of Birth: Jan 08, 1971 Referring Provider: [name]   Encounter Date: 03/19/2017  PT End of Session - 03/19/17 1435    Visit Number  11    Number of Visits  13    Date for PT Re-Evaluation  03/20/17    PT Start Time  1432    PT Stop Time  1513    PT Time Calculation (min)  41 min       Past Medical History:  Diagnosis Date  . 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 Allergy   . Anemia    Iron Deficiency  . Arthritis   . Baker cyst   . Breast mass, right 01/18/2010  . Cardiac murmur   . Chronic constipation   . Depression   . Dysmetabolic syndrome   . Fibromyalgia   . Hairiness   . Hydradenitis   . Hypertension   . Incarcerated ventral hernia 09/29/2011  . Obesity   . Obstructive sleep apnea    no CPAP  . Plantar fasciitis   . PVC (premature ventricular contraction)     Past Surgical History:  Procedure Laterality Date  . FOOT ARTHRODESIS Right 12/01/2016   Procedure: ARTHRODESIS FOOT; TRIPLE;  Surgeon: [name], DPM;  Location: [facility] ORS;  Service: Podiatry;  Laterality: Right;  . HERNIA REPAIR  10/09/11   Ventral Incarcerated- Dr. [name]  . OOPHORECTOMY Left 12/09/2009  . TUBAL LIGATION  1993  . VENTRAL HERNIA REPAIR  10/09/2011    There were no vitals filed for this visit.  Subjective Assessment - 03/19/17 1439    Subjective  Pt reports she has been completing her weight shifts at home which are challenging.  She has been completing her HEP 5 days/wk without questions or concerns.    Pertinent History  Pt is a 47 y/o F s/p reconstruction of R foot and ankle. 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 On 12/01/16 pt underwent triple arthrodesis  RLE, percutaneous teno achilles lengthening RLE. Pt with soft splint R ankle following surgery and was NWB. Pt using knee walker in hospital due to poor balance with RW. Pt had appointment with Dr. Ether Griffins on 12/4 who instructed pt to begin [facility] starting with 25% and increasing thereafter. Pt reports she was instructed to WBAT with RW or crutches. Pt used to walk 1-2 miles prior to surgery for exercise before pain became too severe.  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  Pt has been off work since end of September 2018, Dr. Ether Griffins suspects potentially going back to work mid February depending on progress with PT.  Pt uses crutches in community and RW in her apartment.  Easing factors: sitting down/lying down, tylenol.  Aggravating factors: WBing activities.  Pt has not been using ice or heat.  Pt's therapy goals: return to work, walking for exercise to lose weight, be able to move more quickly with ADLs and IADLs.  Pt independent with dressing, showering, driving.  MD gave permission to pt to return to driving with tennis shoe.  Pt reports mild pain when driving when transitioning from brake to gas pedal.   Pt denies any falls since the surgery.  Pt denies any change in weight, night sweats.  Pt able to ambulate up to 50 yards max before needing to sit due to significant pain. Pt is anticipating having same surgery on L foot in the future. Pt has custom orthotic in L shoe.     Limitations  Walking;House hold activities;Lifting;Standing    How long can you sit comfortably?  Unlimited    How long can you stand comfortably?  5 minutes    How long can you walk comfortably?  ~50 yards before significant pain    Diagnostic tests  X-ray post op, see chart     Patient Stated Goals  return to work, return to walking for exercise, be able to complete her ADLs and IADLs more quickly.    Currently in Pain?  No/denies    Multiple Pain Sites  No        Therapeutic Exercise:?   Seated calf stretch in sitting with band 2x45 seconds   BAPS (level 3) PF to float position 3x 15, cues for forward motion only, pt has tendency for inversion with PF motion. Pt reports fatigue at end of each set.   BAPS(level 3) clockwise/couterclockwise ankle roll touching rim 15 rotations each way   Ambulating in gym without AD with cues for proper to off to promote greater R knee F. 2x20 ft. Taking step with L foot and practicing proper to off on RLE with 1UE support x20.   Again ambulating in gym without AD with cues for proper to off to promote greater R knee F. 2x20 ft. Improved technique.   1x 10 standing bilat heel raises with BUE support at treadmill   Complete lateral weight shifts to RLE with BUE supported x20   Complete forward weight shifts to RLE with LUE supported x20   SLS RLE 3x20 seconds with intermittent UE support                         PT Education - 03/19/17 1435    Education provided  Yes    Education Details  Exercise technique    Person(s) Educated  Patient    Methods  Explanation;Demonstration;Verbal cues    Comprehension  Verbalized understanding;Returned demonstration;Verbal cues required;Need further instruction       PT Short Term Goals - 03/09/17 1056      PT SHORT TERM GOAL #1   Title  Pt will be independent with HEP for improved carryover between sessions    Time  2    Period  Weeks    Status  On-going    Target Date  04/06/17        PT Long Term Goals - 03/09/17 1056      PT LONG TERM GOAL #1   Title  Pt's LEFS score will improve to at least 50/80 to demonstrate improved functional use of RLE    Baseline  29/80, 03/09/17: 45/80    Time  6    Period  Weeks    Status  On-going    Target Date  05/04/17      PT LONG TERM GOAL #2   Title  Pt will be able to ambulate with minimal>no gait abnormalities to return to walking for exercise    Baseline  See Evaluation; 03/09/17: Pt still  ambulates with R antalgic gait, decrease forefoot loading and limited DF on RLE    Time  6    Period  Weeks    Status  On-going  Target Date  05/04/17      PT LONG TERM GOAL #3   Title  Pt will be able to stand for at least 30 minutes without pain for return to work    Baseline  5 minutes, 03/09/17: 10 minutes    Time  5    Period  Weeks    Status  On-going    Target Date  05/04/17      PT LONG TERM GOAL #4   Title  Pt's R ankle AROM will improve to [facility] for improved functional use of RLE    Baseline  See Evaluation, 03/09/17: DF: 5, PF: 45, Inv/Ev: measures not saved/to be rechecked    Time  5    Period  Weeks    Status  New    Target Date  05/04/17      PT LONG TERM GOAL #5   Title  Pt's 10mWT will improve to at least 1.0 m/s to return to community ambulation    Baseline  0.32 m/s;    Time  4    Period  Weeks    Status  Deferred    Target Date  05/04/17            Plan - 03/19/17 1439    Clinical Impression Statement  Pt demonstrates significant fatigue controlling BAPS board movement in all directions.  Focused WBing activities on SLS activities and accepting weight to RLE to promote transition to eventually ambulating without AD.  Notied pt with dec R knee flexion and absent toe off on RLE when ambulating which improved following demonstration and cues with practice.  Pt will benefit from continued skilled PT interventions for improved ROM, strength, and gait mechanics.     Rehab Potential  Good    PT Frequency  2x / week    PT Duration  6 weeks    PT Treatment/Interventions  ADLs/Self Care Home Management;Aquatic Therapy;Biofeedback;Cryotherapy;Iontophoresis 4mg /ml Dexamethasone;Electrical Stimulation;Moist Heat;Traction;Ultrasound;Parrafin;Fluidtherapy;Contrast Bath;DME Instruction;Gait training;Stair training;Functional mobility training;Therapeutic activities;Therapeutic exercise;Balance training;Neuromuscular re-education;Patient/family education;Orthotic  Fit/Training;Manual techniques;Compression bandaging;Scar mobilization;Passive range of motion;Dry needling;Energy conservation;Taping    PT Next Visit Plan  Progress therex and balance as tolerated; reassess gait as needed       Patient will benefit from skilled therapeutic intervention in order to improve the following deficits and impairments:  Abnormal gait, Decreased activity tolerance, Decreased balance, Decreased endurance, Decreased knowledge of precautions, Decreased knowledge of use of DME, Decreased mobility, Decreased range of motion, Decreased safety awareness, Decreased scar mobility, Decreased strength, Difficulty walking, Hypomobility, Increased edema, Increased fascial restricitons, Increased muscle spasms, Impaired perceived functional ability, Impaired flexibility, Improper body mechanics, Postural dysfunction, Obesity, Pain  Visit Diagnosis: Pain in right ankle and joints of right foot  Unsteadiness on feet  Muscle weakness (generalized)  Other abnormalities of gait and mobility     Problem List Patient Active Problem List   Diagnosis Date Noted  . Post-op pain 12/01/2016  . 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 Flat foot 01/28/2016  . Aortic sclerosis 10/14/2015  . Midline low back pain without sciatica 09/20/2015  . Leukocytosis 11/29/2014  . Osteoarthritis of left knee 11/20/2014  . History of ventral hernia repair 10/27/2014  . Allergic rhinitis 08/17/2014  . Axillary hidradenitis suppurativa 08/17/2014  . Baker cyst 08/17/2014  . Chronic constipation 08/17/2014  . 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 Engages in travel abroad 08/17/2014  . Fibromyalgia 08/17/2014  . Cardiac murmur 08/17/2014  . Anemia, iron deficiency 08/17/2014  .  Excessive and frequent menstruation 08/17/2014  . Dysmetabolic syndrome 08/17/2014  . Plantar fasciitis 08/17/2014  . Beat, premature ventricular 08/17/2014  . Abnormal neurological finding  suggestive of lumbar-level spinal disorder 08/17/2014  . Obstructive apnea 12/02/2013  . Essential (primary) hypertension 12/09/2009  . Hypertrichosis 12/09/2009  . Extreme obesity 12/09/2009    Encarnacion Chu PT, DPT 03/19/2017, 3:14 PM  [place] [facility] PHYSICAL AND SPORTS MEDICINE [address] [place], [address] Phone: [phone]   Fax:  [phone]  Name: Kathleen Conley MRN: [id number] Date of Birth: Aug 12, 1970

## 2017-03-23 ENCOUNTER — Encounter: Payer: Self-pay | Admitting: Physical Therapy

## 2017-03-23 ENCOUNTER — Ambulatory Visit: Payer: 59 | Attending: Podiatry | Admitting: Physical Therapy

## 2017-03-23 DIAGNOSIS — R2689 Other abnormalities of gait and mobility: Secondary | ICD-10-CM | POA: Insufficient documentation

## 2017-03-23 DIAGNOSIS — M25571 Pain in right ankle and joints of right foot: Secondary | ICD-10-CM | POA: Diagnosis not present

## 2017-03-23 DIAGNOSIS — R2681 Unsteadiness on feet: Secondary | ICD-10-CM | POA: Insufficient documentation

## 2017-03-23 DIAGNOSIS — M6281 Muscle weakness (generalized): Secondary | ICD-10-CM | POA: Diagnosis not present

## 2017-03-23 NOTE — Therapy (Signed)
[facility] [address] [place], [place], [address] Phone: [phone]   Fax:  [phone]  Physical Therapy Treatment  Patient Details  Name: Kathleen Conley MRN: [id number] Date of Birth: 04-21-1970 Referring Provider: [name]   Encounter Date: 03/23/2017    Past Medical History:  Diagnosis Date  . 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 Allergy   . Anemia    Iron Deficiency  . Arthritis   . Baker cyst   . Breast mass, right 01/18/2010  . Cardiac murmur   . Chronic constipation   . Depression   . Dysmetabolic syndrome   . Fibromyalgia   . Hairiness   . Hydradenitis   . Hypertension   . Incarcerated ventral hernia 09/29/2011  . Obesity   . Obstructive sleep apnea    no CPAP  . Plantar fasciitis   . PVC (premature ventricular contraction)     Past Surgical History:  Procedure Laterality Date  . FOOT ARTHRODESIS Right 12/01/2016   Procedure: ARTHRODESIS FOOT; TRIPLE;  Surgeon: Gwyneth RevelsFowler, Justin, DPM;  Location: [facility] ORS;  Service: Podiatry;  Laterality: Right;  . HERNIA REPAIR  10/09/11   Ventral Incarcerated- [name]  . OOPHORECTOMY Left 12/09/2009  . TUBAL LIGATION  1993  . VENTRAL HERNIA REPAIR  10/09/2011    There were no vitals filed for this visit.   Ther-Ex -Sliding on step into DF/PF stretching adding in GT stretch to DF slide -Seated calf raises 3x 12 focusing on GT extension w/ manual resistance  -Seated calf stretch 3x 30sec holds -Standing at treadmill lateral weight shifts on flat bosu 10x to each side 2x -Standing at treadmill anterior/posterior weight shifts on flat bosu 10x to each side 2x  Gait Training -2x 4 min of ambulation around gym with VC  For proper tibial translation, GT ext and push off, and crutch negotiation                           PT Short Term Goals - 03/09/17 1056      PT SHORT TERM GOAL #1   Title  Pt will be independent with HEP for improved carryover between sessions    Time  2    Period  Weeks    Status  On-going    Target Date  04/06/17        PT Long Term Goals - 03/09/17 1056      PT LONG TERM GOAL #1   Title  Pt's LEFS score will improve to at least 50/80 to demonstrate improved functional use of RLE    Baseline  29/80, 03/09/17: 45/80    Time  6    Period  Weeks    Status  On-going    Target Date  05/04/17      PT LONG TERM GOAL #2   Title  Pt will be able to ambulate with minimal>no gait abnormalities to return to walking for exercise    Baseline  See Evaluation; 03/09/17: Pt still ambulates with R antalgic gait, decrease forefoot loading and limited DF on RLE    Time  6    Period  Weeks    Status  On-going    Target Date  05/04/17      PT LONG TERM GOAL #3   Title  Pt will be able to stand for at least 30 minutes without pain for return to work  Baseline  5 minutes, 03/09/17: 10 minutes    Time  5    Period  Weeks    Status  On-going    Target Date  05/04/17      PT LONG TERM GOAL #4   Title  Pt's R ankle AROM will improve to [facility] for improved functional use of RLE    Baseline  See Evaluation, 03/09/17: DF: 5, PF: 45, Inv/Ev: measures not saved/to be rechecked    Time  5    Period  Weeks    Status  New    Target Date  05/04/17      PT LONG TERM GOAL #5   Title  Pt's will improve to at least 1.0 m/s to return to community ambulation    Baseline  0.32 m/s;    Time  4    Period  Weeks    Status  Deferred    Target Date  05/04/17              Patient will benefit from skilled therapeutic intervention in order to improve the following deficits and impairments:     Visit Diagnosis: No diagnosis found.     Problem List Patient Active Problem List   Diagnosis Date Noted  . Post-op pain 12/01/2016  . 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 Flat foot 01/28/2016  . Aortic sclerosis 10/14/2015  . Midline low back pain  without sciatica 09/20/2015  . Leukocytosis 11/29/2014  . Osteoarthritis of left knee 11/20/2014  . History of ventral hernia repair 10/27/2014  . Allergic rhinitis 08/17/2014  . Axillary hidradenitis suppurativa 08/17/2014  . Baker cyst 08/17/2014  . Chronic constipation 08/17/2014  . 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 Engages in travel abroad 08/17/2014  . Fibromyalgia 08/17/2014  . Cardiac murmur 08/17/2014  . Anemia, iron deficiency 08/17/2014  . Excessive and frequent menstruation 08/17/2014  . Dysmetabolic syndrome 08/17/2014  . Plantar fasciitis 08/17/2014  . Beat, premature ventricular 08/17/2014  . 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 Obstructive apnea 12/02/2013  . Essential (primary) hypertension 12/09/2009  . Hypertrichosis 12/09/2009  . Extreme obesity 12/09/2009    Kathleen Conley 03/23/2017, 9:33 AM  [facility] PHYSICAL AND SPORTS MEDICINE [address] [place], [address] Phone: [phone]   Fax:  [phone]  Name: Kathleen Conley MRN: [id number] Date of Birth: 10-Jul-1970

## 2017-03-26 ENCOUNTER — Ambulatory Visit: Payer: 59 | Admitting: Physical Therapy

## 2017-03-26 ENCOUNTER — Encounter: Payer: Self-pay | Admitting: Physical Therapy

## 2017-03-26 DIAGNOSIS — M6281 Muscle weakness (generalized): Secondary | ICD-10-CM | POA: Diagnosis not present

## 2017-03-26 DIAGNOSIS — M25571 Pain in right ankle and joints of right foot: Secondary | ICD-10-CM

## 2017-03-26 DIAGNOSIS — R2681 Unsteadiness on feet: Secondary | ICD-10-CM

## 2017-03-26 DIAGNOSIS — R2689 Other abnormalities of gait and mobility: Secondary | ICD-10-CM

## 2017-03-26 NOTE — Therapy (Signed)
[facility] [address] [place], [place], [address] Phone: [phone]   Fax:  [phone]  Physical Therapy Treatment  Patient Details  Name: Kathleen Conley MRN: [id number] Date of Birth: 04/06/1970 Referring Provider: [name]   Encounter Date: 03/26/2017  PT End of Session - 03/26/17 1600    Visit Number  13    Number of Visits  25    Date for PT Re-Evaluation  05/02/17    PT Start Time  1600    PT Stop Time  1628    PT Time Calculation (min)  28 min    Equipment Utilized During Treatment  Other (comment)    Activity Tolerance  Patient tolerated treatment well;No increased pain    Behavior During Therapy  WFL for tasks assessed/performed       Past Medical History:  Diagnosis Date  . 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 Allergy   . Anemia    Iron Deficiency  . Arthritis   . Baker cyst   . Breast mass, right 01/18/2010  . Cardiac murmur   . Chronic constipation   . Depression   . Dysmetabolic syndrome   . Fibromyalgia   . Hairiness   . Hydradenitis   . Hypertension   . Incarcerated ventral hernia 09/29/2011  . Obesity   . Obstructive sleep apnea    no CPAP  . Plantar fasciitis   . PVC (premature ventricular contraction)     Past Surgical History:  Procedure Laterality Date  . FOOT ARTHRODESIS Right 12/01/2016   Procedure: ARTHRODESIS FOOT; TRIPLE;  Surgeon: Gwyneth RevelsFowler, Justin, DPM;  Location: [facility] ORS;  Service: Podiatry;  Laterality: Right;  . HERNIA REPAIR  10/09/11   Ventral Incarcerated- [name]  . OOPHORECTOMY Left 12/09/2009  . TUBAL LIGATION  1993  . VENTRAL HERNIA REPAIR  10/09/2011    There were no vitals filed for this visit.  Subjective Assessment - 03/26/17 1604    Subjective  Pt reports she has some pain along her lateral R foot.  Pt has been completing her HEP 4 days/wk without questions or concerns.      Pertinent History  Pt is  a 47 y/o F s/p reconstruction of R foot and ankle. 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 On 12/01/16 pt underwent triple arthrodesis RLE, percutaneous teno achilles lengthening RLE. Pt with soft splint R ankle following surgery and was NWB. Pt using knee walker in hospital due to poor balance with RW. Pt had appointment with [name] on 12/4 who [facility] and increasing thereafter. Pt reports she was instructed to WBAT with RW or crutches. Pt used to walk 1-2 miles prior to surgery for exercise before pain became too severe.  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  Pt has been off work since end of September 2018, [name] suspects potentially going back to work mid February depending on progress with PT.  Pt uses crutches in community and RW in her apartment.  Easing factors: sitting down/lying down, tylenol.  Aggravating factors: WBing activities.  Pt has not been using ice or heat.  Pt's therapy goals: return to work, walking for exercise to lose weight, be able to move more quickly with ADLs and IADLs.  Pt independent with dressing, showering, driving.  MD gave permission  to pt to return to driving with tennis shoe.  Pt reports mild pain when driving when transitioning from brake to gas pedal.   Pt denies any falls since the surgery.  Pt denies any change in weight, night sweats.  Pt able to ambulate up to 50 yards max before needing to sit due to significant pain. Pt is anticipating having same surgery on L foot in the future. Pt has custom orthotic in L shoe.     Limitations  Walking;House hold activities;Lifting;Standing    How long can you sit comfortably?  Unlimited    How long can you stand comfortably?  5 minutes    How long can you walk comfortably?  ~50 yards before significant pain    Diagnostic tests  X-ray post op, see chart     Patient Stated Goals  return to work,  return to walking for exercise, be able to complete her ADLs and IADLs more quickly.    Currently in Pain?  Yes    Pain Score  4     Pain Location  Foot    Pain Orientation  Right    Pain Descriptors / Indicators  Aching;Sore    Pain Type  Surgical pain    Pain Onset  More than a month ago    Multiple Pain Sites  No       TREATMENT  Therapeutic Exercise:?   Seated calf stretch in sitting with band 2x45 seconds   BAPS (level 3) PF to float position 3x 15, cues for forward motion only, pt has tendency for inversion with PF motion. Pt reports fatigue at end of each set.   BAPS(level 3) clockwise/couterclockwise ankle roll touching rim x15 rotations each way   Taking step with L foot and practicing proper toe off on RLE with 1UE support x20.   Ambulating in gym without AD with cues for proper toe off to promote greater R knee F. 2x20 ft.   1x 10 standing bilat heel raises with BUE support at treadmill   Complete forward weight shifts to RLE with LUE supported x20   Tandem walking 2x10 ft with 1UE support   SLS RLE 3x30 seconds with intermittent UE support                        PT Education - 03/26/17 1600    Education provided  Yes    Education Details  Exercise technique    Person(s) Educated  Patient    Methods  Explanation;Demonstration;Verbal cues    Comprehension  Verbalized understanding;Returned demonstration;Verbal cues required;Need further instruction       PT Short Term Goals - 03/23/17 0953      PT SHORT TERM GOAL #1   Title  Pt will be independent with HEP for improved carryover between sessions    Time  2    Period  Weeks    Status  Achieved        PT Long Term Goals - 03/23/17 9604      PT LONG TERM GOAL #1   Title  Pt's LEFS score will improve to at least 50/80 to demonstrate improved functional use of RLE    Baseline  29/80, 03/09/17: 45/80 on 2/1    Time  6    Period  Weeks    Status  On-going      PT LONG TERM GOAL #2    Title  Pt will be able to ambulate with minimal>no gait abnormalities  to return to walking for exercise    Baseline  See Evaluation; 03/09/17: Pt still ambulates with R antalgic gait, decrease forefoot loading and limited DF on RLE    Time  6    Period  Weeks      PT LONG TERM GOAL #3   Title  Pt will be able to stand for at least 30 minutes without pain for return to work    Baseline  5 minutes, 03/09/17: 10 minutes 2/1    Time  5    Period  Weeks    Status  On-going    Target Date  05/04/17      PT LONG TERM GOAL #4   Title  Pt's R ankle AROM will improve to [facility] for improved functional use of RLE    Baseline  See Evaluation, 03/09/17: DF: 5, PF: 45, Inv/Ev: measures not saved/to be rechecked    Time  5    Period  Weeks    Status  On-going      PT LONG TERM GOAL #5   Title  Pt's will improve to at least 1.0 m/s to return to community ambulation    Baseline  0.32 m/s;    Time  4    Period  Weeks    Status  On-going            Plan - 03/26/17 1605    Clinical Impression Statement  Continued to focus on proper gait mechanics as pt with decreased toe off RLE.  Improvement following repetitive practice.  Pt demonstrates instability with SLS activity which will be addressed with future sessions.  Pt will benefit from continued skilled PT interventions for improved strength, gait mechanics, ROM, and QOL.    Rehab Potential  Good    PT Frequency  2x / week    PT Duration  6 weeks    PT Treatment/Interventions  ADLs/Self Care Home Management;Aquatic Therapy;Biofeedback;Cryotherapy;Iontophoresis 4mg /ml Dexamethasone;Electrical Stimulation;Moist Heat;Traction;Ultrasound;Parrafin;Fluidtherapy;Contrast Bath;DME Instruction;Gait training;Stair training;Functional mobility training;Therapeutic activities;Therapeutic exercise;Balance training;Neuromuscular re-education;Patient/family education;Orthotic Fit/Training;Manual techniques;Compression bandaging;Scar mobilization;Passive range  of motion;Dry needling;Energy conservation;Taping    PT Home Exercise Plan  R ankle AROM DF/PF/Inv/Ev with red tband, seated heel raises with manual resistance, R single leg balance, icing, ambulating with proper gait mechanics as documented, massage R foot/ankle for decreased swelling    Consulted and Agree with Plan of Care  Patient       Patient will benefit from skilled therapeutic intervention in order to improve the following deficits and impairments:  Abnormal gait, Decreased activity tolerance, Decreased balance, Decreased endurance, Decreased knowledge of precautions, Decreased knowledge of use of DME, Decreased mobility, Decreased range of motion, Decreased safety awareness, Decreased scar mobility, Decreased strength, Difficulty walking, Hypomobility, Increased edema, Increased fascial restricitons, Increased muscle spasms, Impaired perceived functional ability, Impaired flexibility, Improper body mechanics, Postural dysfunction, Obesity, Pain  Visit Diagnosis: Pain in right ankle and joints of right foot  Unsteadiness on feet  Muscle weakness (generalized)  Other abnormalities of gait and mobility     Problem List Patient Active Problem List   Diagnosis Date Noted  . Post-op pain 12/01/2016  . 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 Flat foot 01/28/2016  . Aortic sclerosis 10/14/2015  . Midline low back pain without sciatica 09/20/2015  . Leukocytosis 11/29/2014  . Osteoarthritis of left knee 11/20/2014  . History of ventral hernia repair 10/27/2014  . Allergic rhinitis 08/17/2014  . Axillary hidradenitis suppurativa 08/17/2014  . Baker cyst 08/17/2014  .  Chronic constipation 08/17/2014  . 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 Engages in travel abroad 08/17/2014  . Fibromyalgia 08/17/2014  . Cardiac murmur 08/17/2014  . Anemia, iron deficiency 08/17/2014  . Excessive and frequent menstruation 08/17/2014  . Dysmetabolic syndrome 08/17/2014   . Plantar fasciitis 08/17/2014  . Beat, premature ventricular 08/17/2014  . 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 Obstructive apnea 12/02/2013  . Essential (primary) hypertension 12/09/2009  . Hypertrichosis 12/09/2009  . Extreme obesity 12/09/2009     Encarnacion Chu PT, DPT 03/26/2017, 4:28 PM  [facility] PHYSICAL AND SPORTS MEDICINE [address] [place], [address] Phone: [phone]   Fax:  [phone]  Name: Kathleen Conley MRN: [id number] Date of Birth: Feb 15, 1971

## 2017-03-28 ENCOUNTER — Encounter: Payer: Self-pay | Admitting: Physical Therapy

## 2017-03-28 ENCOUNTER — Ambulatory Visit: Payer: 59 | Admitting: Physical Therapy

## 2017-03-28 DIAGNOSIS — M25571 Pain in right ankle and joints of right foot: Secondary | ICD-10-CM

## 2017-03-28 DIAGNOSIS — R2689 Other abnormalities of gait and mobility: Secondary | ICD-10-CM | POA: Diagnosis not present

## 2017-03-28 DIAGNOSIS — R2681 Unsteadiness on feet: Secondary | ICD-10-CM | POA: Diagnosis not present

## 2017-03-28 DIAGNOSIS — M6281 Muscle weakness (generalized): Secondary | ICD-10-CM

## 2017-03-28 NOTE — Therapy (Signed)
[facility] PHYS[facility]ORTS MEDICINE [address], [place], [address] Phone: [phone]   Fax:  [phone]  Physical Therapy Treatment  Patient Details  Name: Kathleen Conley MRN: [id number] Date of Birth: 03-17-1970 Referring Provider: [name]   Encounter Date: 03/28/2017  PT End of Session - 03/28/17 1305    Visit Number  14    Number of Visits  25    Date for PT Re-Evaluation  05/02/17    PT Start Time  1303    PT Stop Time  1344    PT Time Calculation (min)  41 min    Equipment Utilized During Treatment  Other (comment)    Activity Tolerance  Patient tolerated treatment well;No increased pain    Behavior During Therapy  WFL for tasks assessed/performed       Past Medical History:  Diagnosis Date  . 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 Allergy   . Anemia    Iron Deficiency  . Arthritis   . Baker cyst   . Breast mass, right 01/18/2010  . Cardiac murmur   . Chronic constipation   . Depression   . Dysmetabolic syndrome   . Fibromyalgia   . Hairiness   . Hydradenitis   . Hypertension   . Incarcerated ventral hernia 09/29/2011  . Obesity   . Obstructive sleep apnea    no CPAP  . Plantar fasciitis   . PVC (premature ventricular contraction)     Past Surgical History:  Procedure Laterality Date  . FOOT ARTHRODESIS Right 12/01/2016   Procedure: ARTHRODESIS FOOT; TRIPLE;  Surgeon: [name], DPM;  Location: [facility] ORS;  Service: Podiatry;  Laterality: Right;  . HERNIA REPAIR  10/09/11   Ventral Incarcerated- Dr. [name]  . OOPHORECTOMY Left 12/09/2009  . TUBAL LIGATION  1993  . VENTRAL HERNIA REPAIR  10/09/2011    There were no vitals filed for this visit.  Subjective Assessment - 03/28/17 1308    Subjective  Pt reports her ankle has been extremely painful since yesterday morning.  The pain is constant and is throbbing.  Icing it helps while it is being iced and pt  kept it elevated while sleeping last night.  Pt is unsure why it is hurting her so much, she did not do any different activities.      Pertinent History  Pt is a 47 y/o F s/p reconstruction of R foot and ankle. 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 On 12/01/16 pt underwent triple arthrodesis RLE, percutaneous teno achilles lengthening RLE. Pt with soft splint R ankle following surgery and was NWB. Pt using knee walker in hospital due to poor balance with RW. Pt had appointment with Dr. Ether Griffins on 12/4 who instructed pt to begin [facility] starting with 25% and increasing thereafter. Pt reports she was instructed to WBAT with RW or crutches. Pt used to walk 1-2 miles prior to surgery for exercise before pain became too severe.  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  Pt has been off work since end of September 2018, Dr. Ether Griffins suspects potentially going back to work mid February depending on progress with PT.  Pt uses crutches in community and RW in her apartment.  Easing factors: sitting down/lying down, tylenol.  Aggravating factors: WBing activities.  Pt has not been using ice or heat.  Pt's therapy  goals: return to work, walking for exercise to lose weight, be able to move more quickly with ADLs and IADLs.  Pt independent with dressing, showering, driving.  MD gave permission to pt to return to driving with tennis shoe.  Pt reports mild pain when driving when transitioning from brake to gas pedal.   Pt denies any falls since the surgery.  Pt denies any change in weight, night sweats.  Pt able to ambulate up to 50 yards max before needing to sit due to significant pain. Pt is anticipating having same surgery on L foot in the future. Pt has custom orthotic in L shoe.     Limitations  Walking;House hold activities;Lifting;Standing    How long can you sit comfortably?  Unlimited    How long can you stand comfortably?  5 minutes     How long can you walk comfortably?  ~50 yards before significant pain    Diagnostic tests  X-ray post op, see chart     Patient Stated Goals  return to work, return to walking for exercise, be able to complete her ADLs and IADLs more quickly.    Currently in Pain?  Yes    Pain Score  8     Pain Location  Ankle    Pain Orientation  Right    Pain Descriptors / Indicators  Throbbing    Pain Type  Surgical pain    Pain Onset  More than a month ago    Multiple Pain Sites  No       TREATMENT   Seated calf stretch in sitting with band 2x1 minute   BAPS (level 3) PF to float position 3x 15. Pt reports fatigue at end of each set.   BAPS(level 3) clockwise/couterclockwise ankle roll touching rim x15 rotations each way   Sidelying hip Abd 2x15 RLE   Hooklying Bil hip Abd/ER with GTB 3x15   PROM R ankle x10 into each direction inversion, eversion, PF, DF   Retrograde massage to R ankle and foot to decrease swelling and pain. Pt reports decrease in pain following.      Pt reports decreased pain down to 7/10 at end of session.                    PT Education - 03/28/17 1305    Education provided  Yes    Education Details  Exercise technique    Person(s) Educated  Patient    Methods  Explanation;Demonstration;Verbal cues    Comprehension  Returned demonstration;Verbalized understanding;Verbal cues required;Need further instruction       PT Short Term Goals - 03/23/17 0953      PT SHORT TERM GOAL #1   Title  Pt will be independent with HEP for improved carryover between sessions    Time  2    Period  Weeks    Status  Achieved        PT Long Term Goals - 03/23/17 [id number]      PT LONG TERM GOAL #1   Title  Pt's LEFS score will improve to at least 50/80 to demonstrate improved functional use of RLE    Baseline  29/80, 03/09/17: 45/80 on 2/1    Time  6    Period  Weeks    Status  On-going      PT LONG TERM GOAL #2   Title  Pt will be able to ambulate with  minimal>no gait abnormalities to return to walking for exercise  Baseline  See Evaluation; 03/09/17: Pt still ambulates with R antalgic gait, decrease forefoot loading and limited DF on RLE    Time  6    Period  Weeks      PT LONG TERM GOAL #3   Title  Pt will be able to stand for at least 30 minutes without pain for return to work    Baseline  5 minutes, 03/09/17: 10 minutes 2/1    Time  5    Period  Weeks    Status  On-going    Target Date  05/04/17      PT LONG TERM GOAL #4   Title  Pt's R ankle AROM will improve to [facility] for improved functional use of RLE    Baseline  See Evaluation, 03/09/17: DF: 5, PF: 45, Inv/Ev: measures not saved/to be rechecked    Time  5    Period  Weeks    Status  On-going      PT LONG TERM GOAL #5   Title  Pt's will improve to at least 1.0 m/s to return to community ambulation    Baseline  0.32 m/s;    Time  4    Period  Weeks    Status  On-going            Plan - 03/28/17 1318    Clinical Impression Statement  Pt presents with significant pain in R ankle/foot and significant throbbing pain at rest and with activity.  No new activities to explain sudden increase in pain.  Pt denies popping or clicking.  Had pt perform mat table exercises today only due to severity of pain and pt instructed to continue to rest and ice her ankle/foot as needed for decreased pain and swelling.   Pt responded positively to gentle retrograde massage to R ankle/foot to decrease swelling and pain as well as PROM to R ankle.  Pt will benefit from continued skilled PT interventions for decreased pain and improved functional use of RLE.     Rehab Potential  Good    PT Frequency  2x / week    PT Duration  6 weeks    PT Treatment/Interventions  ADLs/Self Care Home Management;Aquatic Therapy;Biofeedback;Cryotherapy;Iontophoresis 4mg /ml Dexamethasone;Electrical Stimulation;Moist Heat;Traction;Ultrasound;Parrafin;Fluidtherapy;Contrast Bath;DME Instruction;Gait training;Stair  training;Functional mobility training;Therapeutic activities;Therapeutic exercise;Balance training;Neuromuscular re-education;Patient/family education;Orthotic Fit/Training;Manual techniques;Compression bandaging;Scar mobilization;Passive range of motion;Dry needling;Energy conservation;Taping    PT Home Exercise Plan  R ankle AROM DF/PF/Inv/Ev with red tband, seated heel raises with manual resistance, R single leg balance, icing, ambulating with proper gait mechanics as documented, massage R foot/ankle for decreased swelling    Consulted and Agree with Plan of Care  Patient       Patient will benefit from skilled therapeutic intervention in order to improve the following deficits and impairments:  Abnormal gait, Decreased activity tolerance, Decreased balance, Decreased endurance, Decreased knowledge of precautions, Decreased knowledge of use of DME, Decreased mobility, Decreased range of motion, Decreased safety awareness, Decreased scar mobility, Decreased strength, Difficulty walking, Hypomobility, Increased edema, Increased fascial restricitons, Increased muscle spasms, Impaired perceived functional ability, Impaired flexibility, Improper body mechanics, Postural dysfunction, Obesity, Pain  Visit Diagnosis: Pain in right ankle and joints of right foot  Unsteadiness on feet  Muscle weakness (generalized)  Other abnormalities of gait and mobility     Problem List Patient Active Problem List   Diagnosis Date Noted  . Post-op pain 12/01/2016  . 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 Flat foot 01/28/2016  .  Aortic sclerosis 10/14/2015  . Midline low back pain without sciatica 09/20/2015  . Leukocytosis 11/29/2014  . Osteoarthritis of left knee 11/20/2014  . History of ventral hernia repair 10/27/2014  . Allergic rhinitis 08/17/2014  . Axillary hidradenitis suppurativa 08/17/2014  . Baker cyst 08/17/2014  . Chronic constipation 08/17/2014  . Depression, major, recurrent,  mild (HCC) 08/17/2014  . Engages in travel abroad 08/17/2014  . Fibromyalgia 08/17/2014  . Cardiac murmur 08/17/2014  . Anemia, iron deficiency 08/17/2014  . Excessive and frequent menstruation 08/17/2014  . Dysmetabolic syndrome 08/17/2014  . Plantar fasciitis 08/17/2014  . Beat, premature ventricular 08/17/2014  . 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 Obstructive apnea 12/02/2013  . Essential (primary) hypertension 12/09/2009  . Hypertrichosis 12/09/2009  . Extreme obesity 12/09/2009    Encarnacion Chu PT, DPT 03/28/2017, 1:44 PM  [place] [facility] PHYSICAL AND SPORTS MEDICINE [address], [place], [address] Phone: [phone]   Fax:  [phone]  Name: Brigitta Pricer MRN: [id number] Date of Birth: 06/17/1970

## 2017-04-03 ENCOUNTER — Ambulatory Visit: Payer: 59 | Admitting: Physical Therapy

## 2017-04-03 DIAGNOSIS — M25571 Pain in right ankle and joints of right foot: Secondary | ICD-10-CM

## 2017-04-03 DIAGNOSIS — R2689 Other abnormalities of gait and mobility: Secondary | ICD-10-CM | POA: Diagnosis not present

## 2017-04-03 DIAGNOSIS — M6281 Muscle weakness (generalized): Secondary | ICD-10-CM | POA: Diagnosis not present

## 2017-04-03 DIAGNOSIS — R2681 Unsteadiness on feet: Secondary | ICD-10-CM | POA: Diagnosis not present

## 2017-04-03 NOTE — Therapy (Signed)
[facility] PHYS[facility]ORTS MEDICINE [address] [place], [place], [address] Phone: [phone]   Fax:  [phone]  Physical Therapy Treatment  Patient Details  Name: Kathleen Conley MRN: [id number] Date of Birth: 20-Feb-1971 Referring Provider: [name]   Encounter Date: 04/03/2017  PT End of Session - 04/03/17 1534    Visit Number  15    Number of Visits  25    Date for PT Re-Evaluation  05/02/17    PT Start Time  0317    PT Stop Time  0400    PT Time Calculation (min)  43 min    Activity Tolerance  Patient tolerated treatment well;No increased pain    Behavior During Therapy  WFL for tasks assessed/performed       Past Medical History:  Diagnosis Date  . 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 Allergy   . Anemia    Iron Deficiency  . Arthritis   . Baker cyst   . Breast mass, right 01/18/2010  . Cardiac murmur   . Chronic constipation   . Depression   . Dysmetabolic syndrome   . Fibromyalgia   . Hairiness   . Hydradenitis   . Hypertension   . Incarcerated ventral hernia 09/29/2011  . Obesity   . Obstructive sleep apnea    no CPAP  . Plantar fasciitis   . PVC (premature ventricular contraction)     Past Surgical History:  Procedure Laterality Date  . FOOT ARTHRODESIS Right 12/01/2016   Procedure: ARTHRODESIS FOOT; TRIPLE;  Surgeon: Gwyneth RevelsFowler, Justin, DPM;  Location: [facility] ORS;  Service: Podiatry;  Laterality: Right;  . HERNIA REPAIR  10/09/11   Ventral Incarcerated- [name]  . OOPHORECTOMY Left 12/09/2009  . TUBAL LIGATION  1993  . VENTRAL HERNIA REPAIR  10/09/2011    There were no vitals filed for this visit.  Subjective Assessment - 04/03/17 1522    Subjective  Pt reports her pain and swelling subsided Thursday following RICE protocol and had no pain through the weekend and was able to complete exercises. Patient reports no pain today. Patient reports her SLS exercise is  "getting better" and she is focusing on her walking to normalize gait, which is also feels is improving.     Pertinent History  Pt is a 47 y/o F s/p reconstruction of R foot and ankle. 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 On 12/01/16 pt underwent triple arthrodesis RLE, percutaneous teno achilles lengthening RLE. Pt with soft splint R ankle following surgery and was NWB. Pt using knee walker in hospital due to poor balance with RW. Pt had appointment with [name] on 12/4 who [facility] and increasing thereafter. Pt reports she was instructed to WBAT with RW or crutches. Pt used to walk 1-2 miles prior to surgery for exercise before pain became too severe.  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  Pt has been off work since end of September 2018, [name] suspects potentially going back to work mid February depending on progress with PT.  Pt uses crutches in community and RW in her apartment.  Easing factors: sitting down/lying down, tylenol.  Aggravating factors: WBing activities.  Pt has not been using ice or heat.  Pt's therapy goals: return to work, walking for exercise to lose weight, be able to  move more quickly with ADLs and IADLs.  Pt independent with dressing, showering, driving.  MD gave permission to pt to return to driving with tennis shoe.  Pt reports mild pain when driving when transitioning from brake to gas pedal.   Pt denies any falls since the surgery.  Pt denies any change in weight, night sweats.  Pt able to ambulate up to 50 yards max before needing to sit due to significant pain. Pt is anticipating having same surgery on L foot in the future. Pt has custom orthotic in L shoe.     Limitations  Walking;House hold activities;Lifting;Standing    How long can you sit comfortably?  Unlimited    How long can you stand comfortably?  5 minutes    How long can you walk  comfortably?  ~50 yards before significant pain    Diagnostic tests  X-ray post op, see chart     Patient Stated Goals  return to work, return to walking for exercise, be able to complete her ADLs and IADLs more quickly.    Pain Onset  More than a month ago          Ther-Ex -Seated calf stretch w/ strap 3x 30sec holds - BAPS (level 3) PF to float position 3x 15. Pt reports fatigue at end of each set.  - BAPS(level 3) clockwise/couterclockwise ankle roll touching rim x10 rotations each way x2  -Bilat heel raises w/ UE support at treadmill bar 3x 12 -SLS with unilateral (R UE) support on treadmill cuing for wt shift prior to L LE lift 30sec hold > 2 finger R UE support 30sec hold > attempted no UE support w/ R wt shift + LE lift x4 trails  Unable to hold w/o PT support for more than a couple seconds but able to maintain balance through 20 seconds of PT transitioning away from patient  -Large dino disc wt shifting to L and R 2x 10 (1 rep is L +R) and wt shifting post/ant 2x 10 (ant + post is 1 rep) w/ 2 finger bilat UE support   Gait Training: Lead trials of gait w/ AD, where patient demonstrated correct negotiation of crutch and even step length with decreased ankle mobility and toe push off bilat. W/o AD patient had less push off and AROM in bilat ankles, and demonstrated decreased stance time on R LE with shorter step length on L; patient was able to correct this 50% w/ PT cuing for subsequent trials of gait.                   PT Education - 04/03/17 1528    Education provided  Yes    Education Details  Exercise form; gait training    Person(s) Educated  Patient    Methods  Explanation;Demonstration;Verbal cues    Comprehension  Verbal cues required;Returned demonstration;Verbalized understanding       PT Short Term Goals - 03/23/17 0953      PT SHORT TERM GOAL #1   Title  Pt will be independent with HEP for improved carryover between sessions    Time  2    Period   Weeks    Status  Achieved        PT Long Term Goals - 03/23/17 1610      PT LONG TERM GOAL #1   Title  Pt's LEFS score will improve to at least 50/80 to demonstrate improved functional use of RLE    Baseline  29/80, 03/09/17:  45/80 on 2/1    Time  6    Period  Weeks    Status  On-going      PT LONG TERM GOAL #2   Title  Pt will be able to ambulate with minimal>no gait abnormalities to return to walking for exercise    Baseline  See Evaluation; 03/09/17: Pt still ambulates with R antalgic gait, decrease forefoot loading and limited DF on RLE    Time  6    Period  Weeks      PT LONG TERM GOAL #3   Title  Pt will be able to stand for at least 30 minutes without pain for return to work    Baseline  5 minutes, 03/09/17: 10 minutes 2/1    Time  5    Period  Weeks    Status  On-going    Target Date  05/04/17      PT LONG TERM GOAL #4   Title  Pt's R ankle AROM will improve to [facility] for improved functional use of RLE    Baseline  See Evaluation, 03/09/17: DF: 5, PF: 45, Inv/Ev: measures not saved/to be rechecked    Time  5    Period  Weeks    Status  On-going      PT LONG TERM GOAL #5   Title  Pt's will improve to at least 1.0 m/s to return to community ambulation    Baseline  0.32 m/s;    Time  4    Period  Weeks    Status  On-going            Plan - 04/03/17 1539    Clinical Impression Statement  Patient had decreased pain today and was able to participate with therapy. Patient is continuing to make strength and coordination gains with R ankle. Patient still exhibits unsteadiness and lack of confedience with SLS on R LE, but reports she is working on this at home as well. Patient is improving gait as well, and is demonstrating more normalized gait without AD, althought she continues to demonstrate lack of bilat toe push off and decreased R SLS > decreased L step length, she is able to correct this 50% with VC. Pt will continue to benefit from PT to address these deficits.      PT Frequency  2x / week    PT Duration  6 weeks    PT Treatment/Interventions  ADLs/Self Care Home Management;Aquatic Therapy;Biofeedback;Cryotherapy;Iontophoresis 4mg /ml Dexamethasone;Electrical Stimulation;Moist Heat;Traction;Ultrasound;Parrafin;Fluidtherapy;Contrast Bath;DME Instruction;Gait training;Stair training;Functional mobility training;Therapeutic activities;Therapeutic exercise;Balance training;Neuromuscular re-education;Patient/family education;Orthotic Fit/Training;Manual techniques;Compression bandaging;Scar mobilization;Passive range of motion;Dry needling;Energy conservation;Taping    PT Next Visit Plan  Progress therex and balance as tolerated; reassess gait as needed    PT Home Exercise Plan  R ankle AROM DF/PF/Inv/Ev with red tband, seated heel raises with manual resistance, R single leg balance, icing, ambulating with proper gait mechanics as documented, massage R foot/ankle for decreased swelling    Consulted and Agree with Plan of Care  Patient       Patient will benefit from skilled therapeutic intervention in order to improve the following deficits and impairments:  Abnormal gait, Decreased activity tolerance, Decreased balance, Decreased endurance, Decreased knowledge of precautions, Decreased knowledge of use of DME, Decreased mobility, Decreased range of motion, Decreased safety awareness, Decreased scar mobility, Decreased strength, Difficulty walking, Hypomobility, Increased edema, Increased fascial restricitons, Increased muscle spasms, Impaired perceived functional ability, Impaired flexibility, Improper body mechanics, Postural dysfunction, Obesity, Pain  Visit Diagnosis:  Pain in right ankle and joints of right foot     Problem List Patient Active Problem List   Diagnosis Date Noted  . Post-op pain 12/01/2016  . 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 Flat foot 01/28/2016  . Aortic sclerosis 10/14/2015  . Midline low back pain without sciatica  09/20/2015  . Leukocytosis 11/29/2014  . Osteoarthritis of left knee 11/20/2014  . History of ventral hernia repair 10/27/2014  . Allergic rhinitis 08/17/2014  . Axillary hidradenitis suppurativa 08/17/2014  . Baker cyst 08/17/2014  . Chronic constipation 08/17/2014  . 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 Engages in travel abroad 08/17/2014  . Fibromyalgia 08/17/2014  . Cardiac murmur 08/17/2014  . Anemia, iron deficiency 08/17/2014  . Excessive and frequent menstruation 08/17/2014  . Dysmetabolic syndrome 08/17/2014  . Plantar fasciitis 08/17/2014  . Beat, premature ventricular 08/17/2014  . 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 Obstructive apnea 12/02/2013  . Essential (primary) hypertension 12/09/2009  . Hypertrichosis 12/09/2009  . Extreme obesity 12/09/2009   Staci Acosta PT, DPT Staci Acosta 04/03/2017, 4:07 PM  [place] [facility] PHYSICAL AND SPORTS MEDICINE [address], [place], [address] Phone: [phone]   Fax:  [phone]  Name: Halaina Vanduzer MRN: [id number] Date of Birth: 29-Sep-1970

## 2017-04-05 ENCOUNTER — Ambulatory Visit: Payer: 59 | Admitting: Family Medicine

## 2017-04-05 ENCOUNTER — Encounter: Payer: Self-pay | Admitting: Family Medicine

## 2017-04-05 VITALS — BP 128/82 | HR 95 | Temp 98.5°F | Resp 16 | Wt 248.1 lb

## 2017-04-05 DIAGNOSIS — E669 Obesity, unspecified: Secondary | ICD-10-CM | POA: Diagnosis not present

## 2017-04-05 DIAGNOSIS — E8881 Metabolic syndrome: Secondary | ICD-10-CM

## 2017-04-05 DIAGNOSIS — F332 Major depressive disorder, recurrent severe without psychotic features: Secondary | ICD-10-CM | POA: Diagnosis not present

## 2017-04-05 MED ORDER — AMPHETAMINE-DEXTROAMPHETAMINE 10 MG PO TABS
10.0000 mg | ORAL_TABLET | Freq: Two times a day (BID) | ORAL | 0 refills | Status: DC
Start: 2017-04-05 — End: 2017-10-10

## 2017-04-05 NOTE — Progress Notes (Signed)
Name: Kathleen Conley   MRN: [id number]    DOB: 24-Oct-1970   Date:04/05/2017       Progress Note  Subjective  Chief Complaint  Chief Complaint  Patient presents with  . Follow-up    6 week F/U  . Depression  . Metabolic Syndrome    HPI  Major depression: phq is much higher since last visit, she is taking medication, worried about cost of therapy and refuses to see psychiatrist. She is feeling discouraged about recovering from right ankle surgery. Out of work since 10/2016, still having PT, staying at home and misses work. Also worried about the ability of ever going back to work. She states sometimes she stays in bed all day, she is eating twice a day, and not eating healthy. Yesterday she stayed in bed until 5 pm. She is still showering and taking care of her dishes and laundry. Not cooking much. Denies suicidal thoughts or ideation.   Metabolic Syndrome and Obesity: we tried giving her Trulicity but needs step therapy, she failed Metformin, but I don't recommend glipizide at this time. She is obese and very depressed we will try a stimulant at this time. We will start with Adderal to see if she tolerates and try changing to Vyvanse if she is able to afford it.    Patient Active Problem List   Diagnosis Date Noted  . Post-op pain 12/01/2016  . 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 Flat foot 01/28/2016  . Aortic sclerosis 10/14/2015  . Midline low back pain without sciatica 09/20/2015  . Leukocytosis 11/29/2014  . Osteoarthritis of left knee 11/20/2014  . History of ventral hernia repair 10/27/2014  . Allergic rhinitis 08/17/2014  . Axillary hidradenitis suppurativa 08/17/2014  . Baker cyst 08/17/2014  . Chronic constipation 08/17/2014  . 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 Engages in travel abroad 08/17/2014  . Fibromyalgia 08/17/2014  . Cardiac murmur 08/17/2014  . Anemia, iron deficiency 08/17/2014  . Excessive and frequent menstruation  08/17/2014  . Dysmetabolic syndrome 08/17/2014  . Plantar fasciitis 08/17/2014  . Beat, premature ventricular 08/17/2014  . 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 Obstructive apnea 12/02/2013  . Essential (primary) hypertension 12/09/2009  . Hypertrichosis 12/09/2009  . Extreme obesity 12/09/2009    Past Surgical History:  Procedure Laterality Date  . FOOT ARTHRODESIS Right 12/01/2016   Procedure: ARTHRODESIS FOOT; TRIPLE;  Surgeon: [name], DPM;  Location: [facility] ORS;  Service: Podiatry;  Laterality: Right;  . HERNIA REPAIR  10/09/11   Ventral Incarcerated- Dr. [name]  . OOPHORECTOMY Left 12/09/2009  . TUBAL LIGATION  1993  . VENTRAL HERNIA REPAIR  10/09/2011    Family History  Problem Relation Age of Onset  . Hypertension Mother   . CVA Mother   . Kidney disease Mother   . Congenital heart disease Mother   . Heart disease Father   . Hypertension Father   . Diabetes Father   . Breast cancer Paternal Grandmother        Bilateral  . Colon cancer Maternal Grandfather   . Colon cancer Maternal Uncle     Social History   Socioeconomic History  . Marital status: Single    Spouse name: Not on file  . Number of children: 3  . Years of education: Associates  . Highest education level: Not on file  Social Needs  . Financial resource strain: Not on file  . Food insecurity - worry: Not on file  .  Food insecurity - inability: Not on file  . Transportation needs - medical: Not on file  . Transportation needs - non-medical: Not on file  Occupational History  . Occupation: LPN at the [employer]  . Smoking status: Never Smoker  . Smokeless tobacco: Never Used  Substance and Sexual Activity  . Alcohol use: No    Alcohol/week: 0.0 oz  . Drug use: No  . Sexual activity: No    Birth control/protection: IUD  Other Topics Concern  . Not on file  Social History Narrative  . Not on file     Current  Outpatient Medications:  .  acetaminophen (TYLENOL) 500 MG tablet, Take 1 tablet (500 mg total) by mouth every 6 (six) hours as needed. Max of 3 grams daily or 6 pills dialy (Patient taking differently: Take 1,000 mg by mouth every 6 (six) hours as needed for moderate pain or headache. ), Disp: 30 tablet, Rfl: 0 .  Armodafinil (NUVIGIL) 150 MG tablet, Take 1 tablet (150 mg total) by mouth daily., Disp: 30 tablet, Rfl: 2 .  aspirin EC 81 MG tablet, Take 1 tablet (81 mg total) by mouth daily., Disp: 30 tablet, Rfl: 0 .  buPROPion (WELLBUTRIN XL) 150 MG 24 hr tablet, Take 1 tablet (150 mg total) by mouth daily., Disp: 90 tablet, Rfl: 0 .  Cholecalciferol (VITAMIN D) 2000 UNITS tablet, Take 2,000 Units by mouth daily. , Disp: , Rfl:  .  docusate sodium (COLACE) 100 MG capsule, Take 100 mg by mouth daily as needed for moderate constipation. , Disp: , Rfl:  .  DULoxetine (CYMBALTA) 60 MG capsule, Take 1 capsule (60 mg total) by mouth daily., Disp: 90 capsule, Rfl: 0 .  ferrous sulfate 325 (65 FE) MG tablet, Take 325 mg by mouth daily. , Disp: , Rfl:  .  fluticasone (FLONASE) 50 MCG/ACT nasal spray, Place 2 sprays into both nostrils daily. (Patient taking differently: Place 2 sprays into both nostrils daily as needed for allergies. ), Disp: 16 g, Rfl: 1 .  ibuprofen (ADVIL,MOTRIN) 200 MG tablet, Take 400 mg by mouth every 6 (six) hours as needed for headache or moderate pain., Disp: , Rfl:  .  levonorgestrel (MIRENA, 52 MG,) 20 MCG/24HR IUD, 1 each by Intrauterine route once. , Disp: , Rfl:  .  loratadine (CLARITIN) 10 MG tablet, Take 1 tablet (10 mg total) by mouth daily. (Patient taking differently: Take 10 mg by mouth daily as needed for allergies. ), Disp: 30 tablet, Rfl: 11 .  metaxalone (SKELAXIN) 800 MG tablet, Take 1 tablet (800 mg total) by mouth 3 (three) times daily. (Patient taking differently: Take 800 mg by mouth 3 (three) times daily as needed for muscle spasms. ), Disp: 90 tablet, Rfl: 2 .   Multiple Vitamins-Minerals (MULTIVITAMIN PO), Take 1 tablet by mouth daily., Disp: , Rfl:  .  olmesartan (BENICAR) 40 MG tablet, Take 1 tablet (40 mg total) by mouth daily., Disp: 30 tablet, Rfl: 2 .  Dulaglutide (TRULICITY) 1.5 MG/0.5ML SOPN, Inject 1.5 mg into the skin once a week. (Patient not taking: Reported on 04/05/2017), Disp: 4 pen, Rfl: 0  Allergies  Allergen Reactions  . Hctz [Hydrochlorothiazide] Cough  . Lisinopril Cough          ROS  Constitutional: Negative for fever or weight change.  Respiratory: Negative for cough and shortness of breath.   Cardiovascular: Negative for chest pain or palpitations.  Gastrointestinal: Negative for abdominal pain, no bowel changes.  Musculoskeletal: Positive or gait problem and some right ankle  joint swelling.  Skin: Negative for rash.  Neurological: Negative for dizziness or headache.  No other specific complaints in a complete review of systems (except as listed in HPI above).  Objective  Vitals:   04/05/17 1547  BP: 128/82  Pulse: 95  Resp: 16  Temp: 98.5 F (36.9 C)  TempSrc: Oral  SpO2: 96%  Weight: 248 lb 1.6 oz (112.5 kg)    Body mass index is 45.38 kg/m.  Physical Exam  Constitutional: Patient appears well-developed and well-nourished. Obese  No distress.  HEENT: head atraumatic, normocephalic, pupils equal and reactive to light,  neck supple, throat within normal limits Cardiovascular: Normal rate, regular rhythm and normal heart sounds.  No murmur heard. No BLE edema. Pulmonary/Chest: Effort normal and breath sounds normal. No respiratory distress. Abdominal: Soft.  There is no tenderness. Psychiatric: Patient has a normal mood and affect. behavior is normal. Judgment and thought content normal.  PHQ2/9: Depression screen [facility]/9 04/05/2017 02/23/2017 11/28/2016 11/15/2016 07/03/2016  Decreased Interest 3 0 0 0 1  Down, Depressed, Hopeless 3 0 0 0 1  PHQ - 2 Score 6 0 0 0 2  Altered sleeping 3 - - - 0  Tired,  decreased energy 3 - - - 0  Change in appetite 2 - - - 1  Feeling bad or failure about yourself  3 - - - 1  Trouble concentrating 3 - - - 0  Moving slowly or fidgety/restless 0 - - - 0  Suicidal thoughts 0 - - - 0  PHQ-9 Score 20 - - - 4  Difficult doing work/chores Very difficult - - - Somewhat difficult     Fall Risk: Fall Risk  02/23/2017 11/28/2016 11/15/2016 07/03/2016 05/31/2016  Falls in the past year? No No No No No     Functional Status Survey: Is the patient deaf or have difficulty hearing?: No Does the patient have difficulty seeing, even when wearing glasses/contacts?: Yes Does the patient have difficulty concentrating, remembering, or making decisions?: No Does the patient have difficulty walking or climbing stairs?: Yes Does the patient have difficulty dressing or bathing?: No Does the patient have difficulty doing errands alone such as visiting a doctor's office or shopping?: No    Assessment & Plan  1. Severe episode of recurrent major depressive disorder, without psychotic features (HCC)  Hold nuvigil while taking - currently not taking it, usually takes it when she works - amphetamine-dextroamphetamine (ADDERALL) 10 MG tablet; Take 1 tablet (10 mg total) by mouth 2 (two) times daily with a meal.  Dispense: 60 tablet; Refill: 0  2. Obesity (BMI 30-39.9)  Trulicity not approved  3. Dysmetabolic syndrome  Not on medication, very depressed we will try stimulant to help with symptoms of depression

## 2017-04-06 ENCOUNTER — Ambulatory Visit: Payer: 59 | Admitting: Family Medicine

## 2017-04-09 ENCOUNTER — Encounter: Payer: Self-pay | Admitting: Physical Therapy

## 2017-04-09 ENCOUNTER — Ambulatory Visit: Payer: 59 | Admitting: Physical Therapy

## 2017-04-09 DIAGNOSIS — R2689 Other abnormalities of gait and mobility: Secondary | ICD-10-CM | POA: Diagnosis not present

## 2017-04-09 DIAGNOSIS — M25571 Pain in right ankle and joints of right foot: Secondary | ICD-10-CM | POA: Diagnosis not present

## 2017-04-09 DIAGNOSIS — R2681 Unsteadiness on feet: Secondary | ICD-10-CM | POA: Diagnosis not present

## 2017-04-09 DIAGNOSIS — M6281 Muscle weakness (generalized): Secondary | ICD-10-CM | POA: Diagnosis not present

## 2017-04-09 NOTE — Therapy (Signed)
[facility] PHYSICAL AND SPORTS MEDICINE [address] [place], [place], [address] Phone: [phone]   Fax:  [phone]  Physical Therapy Treatment  Patient Details  Name: Kathleen Conley MRN: [id number] Date of Birth: 1970-11-01 Referring Provider: [name]   Encounter Date: 04/09/2017  PT End of Session - 04/09/17 1616    Visit Number  16    Number of Visits  25    Date for PT Re-Evaluation  05/02/17    PT Start Time  0318    PT Stop Time  0400    PT Time Calculation (min)  42 min    Activity Tolerance  Patient tolerated treatment well;No increased pain    Behavior During Therapy  WFL for tasks assessed/performed       Past Medical History:  Diagnosis Date  . 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 Allergy   . Anemia    Iron Deficiency  . Arthritis   . Baker cyst   . Breast mass, right 01/18/2010  . Cardiac murmur   . Chronic constipation   . Depression   . Dysmetabolic syndrome   . Fibromyalgia   . Hairiness   . Hydradenitis   . Hypertension   . Incarcerated ventral hernia 09/29/2011  . Obesity   . Obstructive sleep apnea    no CPAP  . Plantar fasciitis   . PVC (premature ventricular contraction)     Past Surgical History:  Procedure Laterality Date  . FOOT ARTHRODESIS Right 12/01/2016   Procedure: ARTHRODESIS FOOT; TRIPLE;  Surgeon: Gwyneth RevelsFowler, Justin, DPM;  Location: [facility] ORS;  Service: Podiatry;  Laterality: Right;  . HERNIA REPAIR  10/09/11   Ventral Incarcerated- [name]  . OOPHORECTOMY Left 12/09/2009  . TUBAL LIGATION  1993  . VENTRAL HERNIA REPAIR  10/09/2011    There were no vitals filed for this visit.  Subjective Assessment - 04/09/17 1608    Subjective  Pt reports min pain through the weekend. She reports she had some swelling, that subsided with ice. She reports she has been working on her balance but she lacks confedience.     Pertinent History  Pt is a 47 y/o F  s/p reconstruction of R foot and ankle. 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 On 12/01/16 pt underwent triple arthrodesis RLE, percutaneous teno achilles lengthening RLE. Pt with soft splint R ankle following surgery and was NWB. Pt using knee walker in hospital due to poor balance with RW. Pt had appointment with [name] on 12/4 who instructed pt to [facility] starting with 25% and increasing thereafter. Pt reports she was instructed to WBAT with RW or crutches. Pt used to walk 1-2 miles prior to surgery for exercise before pain became too severe.  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  Pt has been off work since end of September 2018, [name] suspects potentially going back to work mid February depending on progress with PT.  Pt uses crutches in community and RW in her apartment.  Easing factors: sitting down/lying down, tylenol.  Aggravating factors: WBing activities.  Pt has not been using ice or heat.  Pt's therapy goals: return to work, walking for exercise to lose weight, be able to move more quickly with ADLs and IADLs.  Pt independent with dressing, showering, driving.  MD gave permission to pt to return to  driving with tennis shoe.  Pt reports mild pain when driving when transitioning from brake to gas pedal.   Pt denies any falls since the surgery.  Pt denies any change in weight, night sweats.  Pt able to ambulate up to 50 yards max before needing to sit due to significant pain. Pt is anticipating having same surgery on L foot in the future. Pt has custom orthotic in L shoe.     Limitations  Walking;House hold activities;Lifting;Standing    How long can you sit comfortably?  Unlimited    How long can you stand comfortably?  5 minutes    How long can you walk comfortably?  ~50 yards before significant pain    Diagnostic tests  X-ray post op, see chart     Patient Stated Goals  return to work, return to  walking for exercise, be able to complete her ADLs and IADLs more quickly.    Pain Onset  More than a month ago              Ther-Ex -Seated DF/PF with GT stretch x20 w/ 3sec hold into GT ext  -DF w/ 1x 12 w/ red tband blue tband 2 x12 -Bilat heel raises w/ UE support at treadmill bar 3x 12 -SLS with unilateral (R UE) support on treadmill cuing for wt shift prior to L LE lift 30sec hold > 2 finger R UE support 30sec hold > 1 finger support > no hand + L toe down support > attempted no UE support w/ R wt shift + LE lift x4 trails  Unable to hold w/o PT support for more than a couple seconds but able to maintain balance through 20 seconds of PT transitioning away from patient   Gait Training: Lead trials of gait w/o AD, where patient continues to demonstrate R SLS with shorter L step length and decreased push of. Patient was able to correct push off 75% with PT cuing. Patient is begininning to demonstrate more tibial translation d/t increased DF ROM and strength allowing for better foot clearance w/ less eversion compensation                  PT Education - 04/09/17 1615    Education provided  Yes    Education Details  Exercise form; gait training; HEP increase to blue tband    Person(s) Educated  Patient    Methods  Explanation;Demonstration;Verbal cues    Comprehension  Verbalized understanding;Returned demonstration;Verbal cues required       PT Short Term Goals - 03/23/17 0953      PT SHORT TERM GOAL #1   Title  Pt will be independent with HEP for improved carryover between sessions    Time  2    Period  Weeks    Status  Achieved        PT Long Term Goals - 03/23/17 1610      PT LONG TERM GOAL #1   Title  Pt's LEFS score will improve to at least 50/80 to demonstrate improved functional use of RLE    Baseline  29/80, 03/09/17: 45/80 on 2/1    Time  6    Period  Weeks    Status  On-going      PT LONG TERM GOAL #2   Title  Pt will be able to ambulate  with minimal>no gait abnormalities to return to walking for exercise    Baseline  See Evaluation; 03/09/17: Pt still ambulates with R antalgic gait,  decrease forefoot loading and limited DF on RLE    Time  6    Period  Weeks      PT LONG TERM GOAL #3   Title  Pt will be able to stand for at least 30 minutes without pain for return to work    Baseline  5 minutes, 03/09/17: 10 minutes 2/1    Time  5    Period  Weeks    Status  On-going    Target Date  05/04/17      PT LONG TERM GOAL #4   Title  Pt's R ankle AROM will improve to [facility] Xxx for improved functional use of RLE    Baseline  See Evaluation, 03/09/17: DF: 5, PF: 45, Inv/Ev: measures not saved/to be rechecked    Time  5    Period  Weeks    Status  On-going      PT LONG TERM GOAL #5   Title  Pt's will improve to at least 1.0 m/s to return to community ambulation    Baseline  0.32 m/s;    Time  4    Period  Weeks    Status  On-going            Plan - 04/09/17 1620    Clinical Impression Statement  Patient is making progress in ROM, espeically with GT ext. Patient reports she feels as though her toe motion is better as well, and that she has been diligently working on this at home.     Rehab Potential  Good    PT Frequency  2x / week    PT Duration  6 weeks    PT Treatment/Interventions  ADLs/Self Care Home Management;Aquatic Therapy;Biofeedback;Cryotherapy;Iontophoresis 4mg /ml Dexamethasone;Electrical Stimulation;Moist Heat;Traction;Ultrasound;Parrafin;Fluidtherapy;Contrast Bath;DME Instruction;Gait training;Stair training;Functional mobility training;Therapeutic activities;Therapeutic exercise;Balance training;Neuromuscular re-education;Patient/family education;Orthotic Fit/Training;Manual techniques;Compression bandaging;Scar mobilization;Passive range of motion;Dry needling;Energy conservation;Taping    PT Next Visit Plan  Progress therex and balance as tolerated;     PT Home Exercise Plan  R ankle AROM DF/PF/Inv/Ev with  red tband, seated heel raises with manual resistance, R single leg balance, icing, ambulating with proper gait mechanics as documented, massage R foot/ankle for decreased swelling    Consulted and Agree with Plan of Care  Patient       Patient will benefit from skilled therapeutic intervention in order to improve the following deficits and impairments:  Abnormal gait, Decreased activity tolerance, Decreased balance, Decreased endurance, Decreased knowledge of precautions, Decreased knowledge of use of DME, Decreased mobility, Decreased range of motion, Decreased safety awareness, Decreased scar mobility, Decreased strength, Difficulty walking, Hypomobility, Increased edema, Increased fascial restricitons, Increased muscle spasms, Impaired perceived functional ability, Impaired flexibility, Improper body mechanics, Postural dysfunction, Obesity, Pain  Visit Diagnosis: Pain in right ankle and joints of right foot     Problem List Patient Active Problem List   Diagnosis Date Noted  . Post-op pain 12/01/2016  . 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 Flat foot 01/28/2016  . Aortic sclerosis 10/14/2015  . Midline low back pain without sciatica 09/20/2015  . Leukocytosis 11/29/2014  . Osteoarthritis of left knee 11/20/2014  . History of ventral hernia repair 10/27/2014  . Allergic rhinitis 08/17/2014  . Axillary hidradenitis suppurativa 08/17/2014  . Baker cyst 08/17/2014  . Chronic constipation 08/17/2014  . 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 Engages in travel abroad 08/17/2014  . Fibromyalgia 08/17/2014  . Cardiac murmur 08/17/2014  . Anemia, iron deficiency 08/17/2014  . Excessive  and frequent menstruation 08/17/2014  . Dysmetabolic syndrome 08/17/2014  . Plantar fasciitis 08/17/2014  . Beat, premature ventricular 08/17/2014  . 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 Obstructive apnea 12/02/2013  .  Essential (primary) hypertension 12/09/2009  . Hypertrichosis 12/09/2009  . Extreme obesity 12/09/2009   Staci Acosta PT, DPT Staci Acosta 04/09/2017, 4:43 PM  [place] [facility] PHYSICAL AND SPORTS MEDICINE [address] [place], [address] Phone: [phone]   Fax:  [phone]  Name: Ema Hebner MRN: [id number] Date of Birth: [birth year]

## 2017-04-11 ENCOUNTER — Encounter: Payer: Self-pay | Admitting: Physical Therapy

## 2017-04-11 ENCOUNTER — Ambulatory Visit: Payer: 59 | Admitting: Physical Therapy

## 2017-04-11 DIAGNOSIS — M6281 Muscle weakness (generalized): Secondary | ICD-10-CM | POA: Diagnosis not present

## 2017-04-11 DIAGNOSIS — M25571 Pain in right ankle and joints of right foot: Secondary | ICD-10-CM

## 2017-04-11 DIAGNOSIS — R2681 Unsteadiness on feet: Secondary | ICD-10-CM | POA: Diagnosis not present

## 2017-04-11 DIAGNOSIS — R2689 Other abnormalities of gait and mobility: Secondary | ICD-10-CM | POA: Diagnosis not present

## 2017-04-11 NOTE — Therapy (Signed)
[place] [facility] PH[facility] MEDICINE [address], [place], [address] Phone: [phone]   Fax:  [phone]  Physical Therapy Treatment  Patient Details  Name: Kathleen Conley MRN: [id number] Date of Birth: 03-12-1970 Referring Provider: [name]   Encounter Date: 04/11/2017  PT End of Session - 04/11/17 1326    Visit Number  17    Number of Visits  25    Date for PT Re-Evaluation  05/02/17    PT Start Time  0100    PT Stop Time  0145    PT Time Calculation (min)  45 min    Equipment Utilized During Treatment  Other (comment)    Activity Tolerance  Patient tolerated treatment well;No increased pain    Behavior During Therapy  WFL for tasks assessed/performed       Past Medical History:  Diagnosis Date  . 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 Allergy   . Anemia    Iron Deficiency  . Arthritis   . Baker cyst   . Breast mass, right 01/18/2010  . Cardiac murmur   . Chronic constipation   . Depression   . Dysmetabolic syndrome   . Fibromyalgia   . Hairiness   . Hydradenitis   . Hypertension   . Incarcerated ventral hernia 09/29/2011  . Obesity   . Obstructive sleep apnea    no CPAP  . Plantar fasciitis   . PVC (premature ventricular contraction)     Past Surgical History:  Procedure Laterality Date  . FOOT ARTHRODESIS Right 12/01/2016   Procedure: ARTHRODESIS FOOT; TRIPLE;  Surgeon: [name], DPM;  Location: [facility] ORS;  Service: Podiatry;  Laterality: Right;  . HERNIA REPAIR  10/09/11   Ventral Incarcerated- Dr. [name]  . OOPHORECTOMY Left 12/09/2009  . TUBAL LIGATION  1993  . VENTRAL HERNIA REPAIR  10/09/2011    There were no vitals filed for this visit.  Subjective Assessment - 04/11/17 1303    Subjective  Pt reports she is in no pain today, and that she was only mildly sore w/ no pain. Patient reports that she had a migraine yesterday and could not complete her  HEP.     Pertinent History  Pt is a 47 y/o F s/p reconstruction of R foot and ankle. 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 On 12/01/16 pt underwent triple arthrodesis RLE, percutaneous teno achilles lengthening RLE. Pt with soft splint R ankle following surgery and was NWB. Pt using knee walker in hospital due to poor balance with RW. Pt had appointment with Dr. Ether Griffins on 12/4 who instructed pt to begin [facility] starting with 25% and increasing thereafter. Pt reports she was instructed to WBAT with RW or crutches. Pt used to walk 1-2 miles prior to surgery for exercise before pain became too severe.  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  Pt has been off work since end of September 2018, Dr. Ether Griffins suspects potentially going back to work mid February depending on progress with PT.  Pt uses crutches in community and RW in her apartment.  Easing factors: sitting down/lying down, tylenol.  Aggravating factors: WBing activities.  Pt has not been using ice or heat.  Pt's therapy goals: return to work, walking for exercise to lose weight, be able to move more quickly with ADLs and IADLs.  Pt independent with  dressing, showering, driving.  MD gave permission to pt to return to driving with tennis shoe.  Pt reports mild pain when driving when transitioning from brake to gas pedal.   Pt denies any falls since the surgery.  Pt denies any change in weight, night sweats.  Pt able to ambulate up to 50 yards max before needing to sit due to significant pain. Pt is anticipating having same surgery on L foot in the future. Pt has custom orthotic in L shoe.     Limitations  Walking;House hold activities;Lifting;Standing    How long can you sit comfortably?  Unlimited    How long can you stand comfortably?  5 minutes    How long can you walk comfortably?  ~50 yards before significant pain    Diagnostic tests  X-ray post op, see chart      Patient Stated Goals  return to work, return to walking for exercise, be able to complete her ADLs and IADLs more quickly.    Pain Onset  More than a month ago              Manual -Post tibial mob for DF grade III 30 sec bouts 6 bouts -Ant calaneal mob for PF  grade III 30 sec bouts 8 bouts -Manual GT mob for ext grade III 30 sec bouts 6 bouts  Ther-Ex -Bilat toe raises w/ UE support at treadmill bar 3x 12 with cuing for eccentric control -Bilat heel raises w/ UE support at treadmill bar 3x 12 -SLS with unilateral (R UE) support on treadmill cuing for wt shift prior to L LE lift 30sec hold > 2 finger R UE support 30sec hold > 1 finger support > no hand + L toe down support > attempted no UE support w/ R wt shift + LE lift x3 trails  Able to hold for 11sec > 7sec > 4sec w/ PT CGA   Gait Training: Lead trials of gait w/o AD, patient had more even step length with only slight decreased L step length and R SLS time as compared to past trials. Patient is doing a nice job clearing foot with active DF and GT ext, but continues to have trouble with PF and toe push off. Patient has enough PF to prevent eversion compensation, but enough to normalize gait or negotiate steps safely w/ reciprocal pattern.                  PT Education - 04/11/17 1320    Education provided  Yes    Education Details  Exercise form; HEP added ant tib stretch    Person(s) Educated  Patient    Methods  Explanation;Demonstration;Tactile cues;Verbal cues;Handout    Comprehension  Returned demonstration;Verbalized understanding       PT Short Term Goals - 03/23/17 0953      PT SHORT TERM GOAL #1   Title  Pt will be independent with HEP for improved carryover between sessions    Time  2    Period  Weeks    Status  Achieved        PT Long Term Goals - 03/23/17 7829      PT LONG TERM GOAL #1   Title  Pt's LEFS score will improve to at least 50/80 to demonstrate improved functional use of RLE     Baseline  29/80, 03/09/17: 45/80 on 2/1    Time  6    Period  Weeks    Status  On-going  PT LONG TERM GOAL #2   Title  Pt will be able to ambulate with minimal>no gait abnormalities to return to walking for exercise    Baseline  See Evaluation; 03/09/17: Pt still ambulates with R antalgic gait, decrease forefoot loading and limited DF on RLE    Time  6    Period  Weeks      PT LONG TERM GOAL #3   Title  Pt will be able to stand for at least 30 minutes without pain for return to work    Baseline  5 minutes, 03/09/17: 10 minutes 2/1    Time  5    Period  Weeks    Status  On-going    Target Date  05/04/17      PT LONG TERM GOAL #4   Title  Pt's R ankle AROM will improve to [facility] for improved functional use of RLE    Baseline  See Evaluation, 03/09/17: DF: 5, PF: 45, Inv/Ev: measures not saved/to be rechecked    Time  5    Period  Weeks    Status  On-going      PT LONG TERM GOAL #5   Title  Pt's will improve to at least 1.0 m/s to return to community ambulation    Baseline  0.32 m/s;    Time  4    Period  Weeks    Status  On-going            Plan - 04/11/17 1332    Clinical Impression Statement  Patient was able to complete standing heel raises and toe raises w/o break between but needed prolonged rest after (standing and exercising for 7 minutes consecutively). PT continued to work on SLS balance and normailizing gait which patient tolerated well with no compliants of pain, both of which are improving (see note for gait assessment). Patient responded well to manual therapy resulting in feeling as though her ankle "moved a little better".    Rehab Potential  Good    PT Frequency  2x / week    PT Duration  6 weeks    PT Treatment/Interventions  ADLs/Self Care Home Management;Aquatic Therapy;Biofeedback;Cryotherapy;Iontophoresis 4mg /ml Dexamethasone;Electrical Stimulation;Moist Heat;Traction;Ultrasound;Parrafin;Fluidtherapy;Contrast Bath;DME Instruction;Gait training;Stair  training;Functional mobility training;Therapeutic activities;Therapeutic exercise;Balance training;Neuromuscular re-education;Patient/family education;Orthotic Fit/Training;Manual techniques;Compression bandaging;Scar mobilization;Passive range of motion;Dry needling;Energy conservation;Taping    PT Next Visit Plan  Progress therex and balance as tolerated;     PT Home Exercise Plan  ant tib stretch added 2/20; R ankle AROM DF/PF/Inv/Ev with red tband, seated heel raises with manual resistance, R single leg balance, icing, ambulating with proper gait mechanics as documented, massage R foot/ankle for decreased swelling    Consulted and Agree with Plan of Care  Patient       Patient will benefit from skilled therapeutic intervention in order to improve the following deficits and impairments:  Abnormal gait, Decreased activity tolerance, Decreased balance, Decreased endurance, Decreased knowledge of precautions, Decreased knowledge of use of DME, Decreased mobility, Decreased range of motion, Decreased safety awareness, Decreased scar mobility, Decreased strength, Difficulty walking, Hypomobility, Increased edema, Increased fascial restricitons, Increased muscle spasms, Impaired perceived functional ability, Impaired flexibility, Improper body mechanics, Postural dysfunction, Obesity, Pain  Visit Diagnosis: Pain in right ankle and joints of right foot  Unsteadiness on feet     Problem List Patient Active Problem List   Diagnosis Date Noted  . Post-op pain 12/01/2016  . 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 Flat foot 01/28/2016  .  Aortic sclerosis 10/14/2015  . Midline low back pain without sciatica 09/20/2015  . Leukocytosis 11/29/2014  . Osteoarthritis of left knee 11/20/2014  . History of ventral hernia repair 10/27/2014  . Allergic rhinitis 08/17/2014  . Axillary hidradenitis suppurativa 08/17/2014  . Baker cyst 08/17/2014  . Chronic constipation 08/17/2014  . Depression,  major, recurrent, mild (HCC) 08/17/2014  . Engages in travel abroad 08/17/2014  . Fibromyalgia 08/17/2014  . Cardiac murmur 08/17/2014  . Anemia, iron deficiency 08/17/2014  . Excessive and frequent menstruation 08/17/2014  . Dysmetabolic syndrome 08/17/2014  . Plantar fasciitis 08/17/2014  . Beat, premature ventricular 08/17/2014  . 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 Obstructive apnea 12/02/2013  . Essential (primary) hypertension 12/09/2009  . Hypertrichosis 12/09/2009  . Extreme obesity 12/09/2009   Staci Acostahelsea Miller PT, DPT  Staci Acostahelsea Miller 04/11/2017, 1:57 PM  [facility] [address] [place], [place], [address] Phone: [phone]   Fax:  [phone]  Name: Kathleen Conley MRN: [id number] Date of Birth: 1970/02/28

## 2017-04-16 ENCOUNTER — Ambulatory Visit: Payer: 59 | Admitting: Physical Therapy

## 2017-04-17 DIAGNOSIS — M216X2 Other acquired deformities of left foot: Secondary | ICD-10-CM | POA: Diagnosis not present

## 2017-04-17 DIAGNOSIS — M76829 Posterior tibial tendinitis, unspecified leg: Secondary | ICD-10-CM | POA: Diagnosis not present

## 2017-04-18 ENCOUNTER — Ambulatory Visit: Payer: 59 | Admitting: Physical Therapy

## 2017-04-18 ENCOUNTER — Encounter: Payer: Self-pay | Admitting: Physical Therapy

## 2017-04-18 DIAGNOSIS — R2681 Unsteadiness on feet: Secondary | ICD-10-CM | POA: Diagnosis not present

## 2017-04-18 DIAGNOSIS — M6281 Muscle weakness (generalized): Secondary | ICD-10-CM | POA: Diagnosis not present

## 2017-04-18 DIAGNOSIS — M25571 Pain in right ankle and joints of right foot: Secondary | ICD-10-CM | POA: Diagnosis not present

## 2017-04-18 DIAGNOSIS — R2689 Other abnormalities of gait and mobility: Secondary | ICD-10-CM | POA: Diagnosis not present

## 2017-04-18 NOTE — Therapy (Signed)
[facility] [address] [place], [place], [address] Phone: [phone]   Fax:  [phone]  Physical Therapy Treatment  Patient Details  Name: Kathleen Conley MRN: [id number] Date of Birth: 11/03/1970 Referring Provider: [name]   Encounter Date: 04/18/2017  PT End of Session - 04/18/17 1442    Visit Number  18    Number of Visits  25    Date for PT Re-Evaluation  05/02/17    PT Start Time  0145    PT Stop Time  0231    PT Time Calculation (min)  46 min    Activity Tolerance  Patient tolerated treatment well;No increased pain    Behavior During Therapy  WFL for tasks assessed/performed       Past Medical History:  Diagnosis Date  . 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 Allergy   . Anemia    Iron Deficiency  . Arthritis   . Baker cyst   . Breast mass, right 01/18/2010  . Cardiac murmur   . Chronic constipation   . Depression   . Dysmetabolic syndrome   . Fibromyalgia   . Hairiness   . Hydradenitis   . Hypertension   . Incarcerated ventral hernia 09/29/2011  . Obesity   . Obstructive sleep apnea    no CPAP  . Plantar fasciitis   . PVC (premature ventricular contraction)     Past Surgical History:  Procedure Laterality Date  . FOOT ARTHRODESIS Right 12/01/2016   Procedure: ARTHRODESIS FOOT; TRIPLE;  Surgeon: Gwyneth RevelsFowler, Justin, DPM;  Location: [facility] ORS;  Service: Podiatry;  Laterality: Right;  . HERNIA REPAIR  10/09/11   Ventral Incarcerated- [name]  . OOPHORECTOMY Left 12/09/2009  . TUBAL LIGATION  1993  . VENTRAL HERNIA REPAIR  10/09/2011    There were no vitals filed for this visit.  Subjective Assessment - 04/18/17 1346    Subjective  Pt reports she saw [name] yesterday and he "released her" from her crutches and gave her a back to work date of 3/25. Pt reports MD was pleased with her progress and she agreed with his assessment and feels as  though she will be ready to return to work per his suggestion. Pt reports no pain today. Patient reports compliance with her HEP, and admits she is still struggling with her balance.     Pertinent History  Pt is a [age] y/o F s/p reconstruction of R foot and ankle. 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 On 12/01/16 pt underwent triple arthrodesis RLE, percutaneous teno achilles lengthening RLE. Pt with soft splint R ankle following surgery and was NWB. Pt using knee walker in hospital due to poor balance with RW. Pt had appointment with [name] on 12/4 who [facility] with [facility]% and increasing thereafter. Pt reports she was instructed to WBAT with RW or crutches. Pt used to walk 1-2 miles prior to surgery for exercise before pain became too severe.  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  Pt has been off work since end of September 2018, [name] suspects potentially going back to work mid February depending on progress with PT.  Pt uses crutches in community and RW in her apartment.  Easing factors: sitting down/lying down, tylenol.  Aggravating factors: WBing activities.  Pt has not been  using ice or heat.  Pt's therapy goals: return to work, walking for exercise to lose weight, be able to move more quickly with ADLs and IADLs.  Pt independent with dressing, showering, driving.  MD gave permission to pt to return to driving with tennis shoe.  Pt reports mild pain when driving when transitioning from brake to gas pedal.   Pt denies any falls since the surgery.  Pt denies any change in weight, night sweats.  Pt able to ambulate up to 50 yards max before needing to sit due to significant pain. Pt is anticipating having same surgery on L foot in the future. Pt has custom orthotic in L shoe.     Limitations  Walking;House hold activities;Lifting;Standing    How long can you sit comfortably?  Unlimited     How long can you stand comfortably?  5 minutes    How long can you walk comfortably?  ~50 yards before significant pain    Diagnostic tests  X-ray post op, see chart     Patient Stated Goals  return to work, return to walking for exercise, be able to complete her ADLs and IADLs more quickly.    Currently in Pain?  No/denies    Pain Onset  More than a month ago        Manual -Post tibial mob for DF grade III 30 sec bouts 8 bouts -Ant calaneal mob for PF  grade III 30 sec bouts 8 bouts -Manual GT mob for ext grade III 30 sec bouts 8 bouts   Ther-Ex -PT tested abd which was 4/5 bilat so PT initiated standing hip abd at treadmill bar with bilat UE support 3x 10 each with cuing for posture -Bilat toe raises w/ UE support at treadmill bar 3x 12 with cuing for eccentric control -Bilat heel raises w/ UE support at treadmill bar 3x 12 -SLS with unilateral (R UE) support on treadmill cuing for wt shift prior to L LE lift 30sec hold > 2 finger R UE support 30sec hold > 1 finger support > no hand + L toe down support > attempted no UE support w/ R wt shift + LE lift x3 trails  Able to hold for 11sec > 7sec > 4sec w/ PT CGA                           PT Education - 04/18/17 1423    Education provided  Yes    Education Details  Exercise form    Person(s) Educated  Patient    Methods  Explanation;Verbal cues;Tactile cues;Demonstration    Comprehension  Verbalized understanding;Returned demonstration;Verbal cues required;Tactile cues required       PT Short Term Goals - 03/23/17 0953      PT SHORT TERM GOAL #1   Title  Pt will be independent with HEP for improved carryover between sessions    Time  2    Period  Weeks    Status  Achieved        PT Long Term Goals - 03/23/17 1610      PT LONG TERM GOAL #1   Title  Pt's LEFS score will improve to at least 50/80 to demonstrate improved functional use of RLE    Baseline  29/80, 03/09/17: 45/80 on 2/1    Time  6     Period  Weeks    Status  On-going      PT LONG TERM GOAL #2  Title  Pt will be able to ambulate with minimal>no gait abnormalities to return to walking for exercise    Baseline  See Evaluation; 03/09/17: Pt still ambulates with R antalgic gait, decrease forefoot loading and limited DF on RLE    Time  6    Period  Weeks      PT LONG TERM GOAL #3   Title  Pt will be able to stand for at least 30 minutes without pain for return to work    Baseline  5 minutes, 03/09/17: 10 minutes 2/1    Time  5    Period  Weeks    Status  On-going    Target Date  05/04/17      PT LONG TERM GOAL #4   Title  Pt's R ankle AROM will improve to [facility] for improved functional use of RLE    Baseline  See Evaluation, 03/09/17: DF: 5, PF: 45, Inv/Ev: measures not saved/to be rechecked    Time  5    Period  Weeks    Status  On-going      PT LONG TERM GOAL #5   Title  Pt's will improve to at least 1.0 m/s to return to community ambulation    Baseline  0.32 m/s;    Time  4    Period  Weeks    Status  On-going            Plan - 04/18/17 1442    Clinical Impression Statement  Patient demonstrated increased PF this session, reporting she feels like her stretching at home has improved this. Patient tolerated therex progression well and was able to stand for the entirety of therex with no compliants of pain, only mild fatigue. Patient is ambulating without crutch today, with slight bilat foot eversion that she is able to correct with cuing. 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     PT Frequency  2x / week    PT Duration  8 weeks    PT Treatment/Interventions  ADLs/Self Care Home Management;Aquatic Therapy;Biofeedback;Cryotherapy;Iontophoresis 4mg /ml Dexamethasone;Electrical Stimulation;Moist Heat;Traction;Ultrasound;Parrafin;Fluidtherapy;Contrast Bath;DME Instruction;Gait training;Stair  training;Functional mobility training;Therapeutic activities;Therapeutic exercise;Balance training;Neuromuscular re-education;Patient/family education;Orthotic Fit/Training;Manual techniques;Compression bandaging;Scar mobilization;Passive range of motion;Dry needling;Energy conservation;Taping    PT Next Visit Plan  Progress therex and balance as tolerated;     PT Home Exercise Plan  ant tib stretch added 2/20; R ankle AROM DF/PF/Inv/Ev with red tband, seated heel raises with manual resistance, R single leg balance, icing, ambulating with proper gait mechanics as documented, massage R foot/ankle for decreased swelling    Consulted and Agree with Plan of Care  Patient       Patient will benefit from skilled therapeutic intervention in order to improve the following deficits and impairments:  Abnormal gait, Decreased activity tolerance, Decreased balance, Decreased endurance, Decreased knowledge of precautions, Decreased knowledge of use of DME, Decreased mobility, Decreased range of motion, Decreased safety awareness, Decreased scar mobility, Decreased strength, Difficulty walking, Hypomobility, Increased edema, Increased fascial restricitons, Increased muscle spasms, Impaired perceived functional ability, Impaired flexibility, Improper body mechanics, Postural dysfunction, Obesity, Pain  Visit Diagnosis: Pain in right ankle and joints of right foot     Problem List Patient Active Problem List   Diagnosis Date Noted  . Post-op pain 12/01/2016  . 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 Flat foot 01/28/2016  . Aortic sclerosis  10/14/2015  . Midline low back pain without sciatica 09/20/2015  . Leukocytosis 11/29/2014  . Osteoarthritis of left knee 11/20/2014  . History of ventral hernia repair 10/27/2014  . Allergic rhinitis 08/17/2014  . Axillary hidradenitis suppurativa 08/17/2014  . Baker cyst 08/17/2014  . Chronic constipation 08/17/2014  . Depression, major, recurrent, mild  (HCC) 08/17/2014  . Engages in travel abroad 08/17/2014  . Fibromyalgia 08/17/2014  . Cardiac murmur 08/17/2014  . Anemia, iron deficiency 08/17/2014  . Excessive and frequent menstruation 08/17/2014  . Dysmetabolic syndrome 08/17/2014  . Plantar fasciitis 08/17/2014  . Beat, premature ventricular 08/17/2014  . 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 Obstructive apnea 12/02/2013  . Essential (primary) hypertension 12/09/2009  . Hypertrichosis 12/09/2009  . Extreme obesity 12/09/2009   Staci Acosta PT, DPT Staci Acosta 04/18/2017, 2:55 PM  [place] [facility] PHYSICAL AND SPORTS MEDICINE [address] [place], [address] Phone: [phone]   Fax:  [phone]  Name: Kathleen Conley MRN: [id number] Date of Birth: Feb 29, 1970

## 2017-04-23 ENCOUNTER — Ambulatory Visit: Payer: 59 | Attending: Podiatry | Admitting: Physical Therapy

## 2017-04-23 ENCOUNTER — Encounter: Payer: Self-pay | Admitting: Physical Therapy

## 2017-04-23 DIAGNOSIS — M25571 Pain in right ankle and joints of right foot: Secondary | ICD-10-CM | POA: Diagnosis not present

## 2017-04-23 NOTE — Therapy (Signed)
[name] PHYSICAL AND SPORTS MEDICINE [address] [place], [place], [address] Phone: [phone]   Fax:  [phone]  Physical Therapy Treatment  Patient Details [facility] Name: Kathleen Conley MRN: [id number] Date of Birth: [birth year] Referring Provider: [name]   Encounter Date: 04/23/2017  PT End of Session - 04/23/17 1450    Visit Number  19    Number of Visits  25    Date for PT Re-Evaluation  05/02/17    PT Start Time  0240    PT Stop Time  0323    PT Time Calculation (min)  43 min    Activity Tolerance  Patient tolerated treatment well;No increased pain    Behavior During Therapy  WFL for tasks assessed/performed       Past Medical History:  Diagnosis Date  . 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 Allergy   . Anemia    Iron Deficiency  . Arthritis   . Baker cyst   . Breast mass, right 01/18/2010  . Cardiac murmur   . Chronic constipation   . Depression   . Dysmetabolic syndrome   . Fibromyalgia   . Hairiness   . Hydradenitis   . Hypertension   . Incarcerated ventral hernia 09/29/2011  . Obesity   . Obstructive sleep apnea    no CPAP  . Plantar fasciitis   . PVC (premature ventricular contraction)     Past Surgical History:  Procedure Laterality Date  . FOOT ARTHRODESIS Right 12/01/2016   Procedure: ARTHRODESIS FOOT; TRIPLE;  Surgeon: Gwyneth RevelsFowler, Justin, DPM;  Location: [facility] ORS;  Service: Podiatry;  Laterality: Right;  . HERNIA REPAIR  10/09/11   Ventral Incarcerated- [name]  . OOPHORECTOMY Left 12/09/2009  . TUBAL LIGATION  1993  . VENTRAL HERNIA REPAIR  10/09/2011    There were no vitals filed for this visit.  Subjective Assessment - 04/23/17 1443    Subjective  Pt reports she is having no pain today, and is ambulating without crutches without problem. She reports [place] Saturday and felt "okay" , no pain, just fatigue. She reports she  is still working on her HEP and balance, but that her lack of balance is discouraging    Pertinent History  Pt is a 47 y/o F s/p reconstruction of R foot and ankle. 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 On 12/01/16 pt underwent triple arthrodesis RLE, percutaneous teno achilles lengthening RLE. Pt with soft splint R ankle following surgery and was NWB. Pt using knee walker in hospital due to poor balance with RW. Pt had appointment with [name] on 12/4 who [facility] and increasing thereafter. Pt reports she was instructed to WBAT with RW or crutches. Pt used to walk 1-2 miles prior to surgery for exercise before pain became too severe.  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  Pt has been off work since end of September 2018, [name] suspects potentially going back to work mid February depending on progress with PT.  Pt uses crutches in community and RW in her apartment.  Easing factors: sitting down/lying down, tylenol.  Aggravating factors: WBing activities.  Pt has not been using ice or heat.  Pt's therapy goals: return to work, walking for exercise to lose weight, be able  to move more quickly with ADLs and IADLs.  Pt independent with dressing, showering, driving.  MD gave permission to pt to return to driving with tennis shoe.  Pt reports mild pain when driving when transitioning from brake to gas pedal.   Pt denies any falls since the surgery.  Pt denies any change in weight, night sweats.  Pt able to ambulate up to 50 yards max before needing to sit due to significant pain. Pt is anticipating having same surgery on L foot in the future. Pt has custom orthotic in L shoe.     Limitations  Walking;House hold activities;Lifting;Standing    How long can you sit comfortably?  Unlimited    How long can you stand comfortably?  5 minutes    How long can you walk comfortably?  ~50 yards  before significant pain    Diagnostic tests  X-ray post op, see chart     Patient Stated Goals  return to work, return to walking for exercise, be able to complete her ADLs and IADLs more quickly.    Pain Onset  More than a month ago               Manual -Post tibial mob for DF grade III 30 sec bouts 8 bouts -Ant calaneal mob for PF  grade III 30 sec bouts 8 bouts -Manual GT mob for ext grade III 30 sec bouts 8 bouts   Ther-Ex -Standing hip abd at treadmill bar with bilat UE support 3x 10 each with cuing for posture -Prone hip ext 3x 10 each w/ cuing for eccentric lowering and glute contraction (patient reports this exercise is also difficult on the L LE) -Side step w/ yellow tband 2x 44ft (down and back) in mini squat position w/ cuing for eccentric control -Bilat heel raises w/ UE support at treadmill bar 3x 12 -SLS with unilateral (R UE) support on treadmill cuing for wt shift prior to L LE lift 30sec hold > 2 finger R UE support 30sec hold > 1 finger support 30sec > no hand + L toe down support 30sec > attempted no UE support w/ R wt shift + LE lift x3 trails  Able to hold for 7sec > 5sec > 4sec w/ PT CGA -Large dina disc wt shift lateral x20 and anterior-posterior x20 w/ B 2 finger support and PT CGA                 PT Education - 04/23/17 1449    Education Details  exercise form    Person(s) Educated  Patient    Methods  Explanation;Demonstration;Tactile cues;Verbal cues    Comprehension  Verbalized understanding;Tactile cues required;Returned demonstration;Verbal cues required       PT Short Term Goals - 03/23/17 0953      PT SHORT TERM GOAL #1   Title  Pt will be independent with HEP for improved carryover between sessions    Time  2    Period  Weeks    Status  Achieved        PT Long Term Goals - 03/23/17 1610      PT LONG TERM GOAL #1   Title  Pt's LEFS score will improve to at least 50/80 to demonstrate improved functional use of RLE     Baseline  29/80, 03/09/17: 45/80 on 2/1    Time  6    Period  Weeks    Status  On-going      PT LONG TERM GOAL #  2   Title  Pt will be able to ambulate with minimal>no gait abnormalities to return to walking for exercise    Baseline  See Evaluation; 03/09/17: Pt still ambulates with R antalgic gait, decrease forefoot loading and limited DF on RLE    Time  6    Period  Weeks      PT LONG TERM GOAL #3   Title  Pt will be able to stand for at least 30 minutes without pain for return to work    Baseline  5 minutes, 03/09/17: 10 minutes 2/1    Time  5    Period  Weeks    Status  On-going    Target Date  05/04/17      PT LONG TERM GOAL #4   Title  Pt's R ankle AROM will improve to [facility] for improved functional use of RLE    Baseline  See Evaluation, 03/09/17: DF: 5, PF: 45, Inv/Ev: measures not saved/to be rechecked    Time  5    Period  Weeks    Status  On-going      PT LONG TERM GOAL #5   Title  Pt's will improve to at least 1.0 m/s to return to community ambulation    Baseline  0.32 m/s;    Time  4    Period  Weeks    Status  On-going            Plan - 04/23/17 1529    Clinical Impression Statement   Patient tolerated therex progression well with noted fatigue, but no increased pain. Patient demonstrated good balance with side stepping exercise. Patient was able to SLS balance for 30sec with 1 finger UE support, but is still having difficulty with no UE support. PT encouraged patient to continue this practice at home to promote confedience.     Rehab Potential  Good    PT Frequency  2x / week    PT Duration  8 weeks    PT Treatment/Interventions  ADLs/Self Care Home Management;Aquatic Therapy;Biofeedback;Cryotherapy;Iontophoresis 4mg /ml Dexamethasone;Electrical Stimulation;Moist Heat;Traction;Ultrasound;Parrafin;Fluidtherapy;Contrast Bath;DME Instruction;Gait training;Stair training;Functional mobility training;Therapeutic activities;Therapeutic exercise;Balance  training;Neuromuscular re-education;Patient/family education;Orthotic Fit/Training;Manual techniques;Compression bandaging;Scar mobilization;Passive range of motion;Dry needling;Energy conservation;Taping    PT Next Visit Plan  Progress therex and balance as tolerated;     PT Home Exercise Plan  sidelying hip abd and prone hip ext added 3/4; ant tib stretch added 2/20; R ankle AROM DF/PF/Inv/Ev with red tband, seated heel raises with manual resistance, R single leg balance, icing, ambulating with proper gait mechanics as documented, massage R foot/ankle for decreased swelling    Consulted and Agree with Plan of Care  Patient       Patient will benefit from skilled therapeutic intervention in order to improve the following deficits and impairments:  Abnormal gait, Decreased activity tolerance, Decreased balance, Decreased endurance, Decreased knowledge of precautions, Decreased knowledge of use of DME, Decreased mobility, Decreased range of motion, Decreased safety awareness, Decreased scar mobility, Decreased strength, Difficulty walking, Hypomobility, Increased edema, Increased fascial restricitons, Increased muscle spasms, Impaired perceived functional ability, Impaired flexibility, Improper body mechanics, Postural dysfunction, Obesity, Pain  Visit Diagnosis: Pain in right ankle and joints of right foot     Problem List Patient Active Problem List   Diagnosis Date Noted  . Post-op pain 12/01/2016  . 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 Flat foot 01/28/2016  . Aortic sclerosis 10/14/2015  . Midline low back pain without sciatica 09/20/2015  .  Leukocytosis 11/29/2014  . Osteoarthritis of left knee 11/20/2014  . History of ventral hernia repair 10/27/2014  . Allergic rhinitis 08/17/2014  . Axillary hidradenitis suppurativa 08/17/2014  . Baker cyst 08/17/2014  . Chronic constipation 08/17/2014  . 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 Engages in travel abroad  08/17/2014  . Fibromyalgia 08/17/2014  . Cardiac murmur 08/17/2014  . Anemia, iron deficiency 08/17/2014  . Excessive and frequent menstruation 08/17/2014  . Dysmetabolic syndrome 08/17/2014  . Plantar fasciitis 08/17/2014  . Beat, premature ventricular 08/17/2014  . 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 Obstructive apnea 12/02/2013  . Essential (primary) hypertension 12/09/2009  . Hypertrichosis 12/09/2009  . Extreme obesity 12/09/2009   Staci Acosta PT, DPT Staci Acosta 04/23/2017, 3:55 PM  [place] [facility] PHYSICAL AND SPORTS MEDICINE [address] [place], [address] Phone: [phone]   Fax:  [phone]  Name: Aiesha Leland MRN: [id number] Date of Birth: 1970-11-10

## 2017-04-25 ENCOUNTER — Encounter: Payer: Self-pay | Admitting: Physical Therapy

## 2017-04-25 ENCOUNTER — Ambulatory Visit: Payer: 59 | Admitting: Physical Therapy

## 2017-04-25 DIAGNOSIS — M25571 Pain in right ankle and joints of right foot: Secondary | ICD-10-CM

## 2017-04-25 NOTE — Therapy (Signed)
[facility] PHYSICAL [facility] SPORTS MEDICINE [address], [place], [address] Phone: [phone]   Fax:  [phone]  Physical Therapy Treatment  Patient Details  Name: Kathleen Conley MRN: [id number] Date of Birth: December 03, 1970 Referring Provider: [name]   Encounter Date: 04/25/2017  PT End of Session - 04/25/17 1510    Visit Number  20    Number of Visits  25    Date for PT Re-Evaluation  05/02/17    PT Start Time  0233    PT Stop Time  0315    PT Time Calculation (min)  42 min    Activity Tolerance  Patient tolerated treatment well;No increased pain    Behavior During Therapy  WFL for tasks assessed/performed       Past Medical History:  Diagnosis Date  . 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 Allergy   . Anemia    Iron Deficiency  . Arthritis   . Baker cyst   . Breast mass, right 01/18/2010  . Cardiac murmur   . Chronic constipation   . Depression   . Dysmetabolic syndrome   . Fibromyalgia   . Hairiness   . Hydradenitis   . Hypertension   . Incarcerated ventral hernia 09/29/2011  . Obesity   . Obstructive sleep apnea    no CPAP  . Plantar fasciitis   . PVC (premature ventricular contraction)     Past Surgical History:  Procedure Laterality Date  . FOOT ARTHRODESIS Right 12/01/2016   Procedure: ARTHRODESIS FOOT; TRIPLE;  Surgeon: [name], DPM;  Location: [facility] ORS;  Service: Podiatry;  Laterality: Right;  . HERNIA REPAIR  10/09/11   Ventral Incarcerated- Dr. [name]  . OOPHORECTOMY Left 12/09/2009  . TUBAL LIGATION  1993  . VENTRAL HERNIA REPAIR  10/09/2011    There were no vitals filed for this visit.  Subjective Assessment - 04/25/17 1435    Subjective  Pt reports she is having no pain today. She reports she has been able to walk at a time wtih very minimal pain (mostly in the L foot). Patiient reports compliance with her HEP. NO questions or concerns at this  time.    Pertinent History  Pt is a 47 y/o F s/p reconstruction of R foot and ankle. 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 On 12/01/16 pt underwent triple arthrodesis RLE, percutaneous teno achilles lengthening RLE. Pt with soft splint R ankle following surgery and was NWB. Pt using knee walker in hospital due to poor balance with RW. Pt had appointment with Dr. Ether Griffins on 12/4 who instructed pt to begin [facility] starting with 25% and increasing thereafter. Pt reports she was instructed to WBAT with RW or crutches. Pt used to walk 1-2 miles prior to surgery for exercise before pain became too severe.  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  Pt has been off work since end of September 2018, Dr. Ether Griffins suspects potentially going back to work mid February depending on progress with PT.  Pt uses crutches in community and RW in her apartment.  Easing factors: sitting down/lying down, tylenol.  Aggravating factors: WBing activities.  Pt has not been using ice or heat.  Pt's therapy goals: return to work, walking for exercise to lose weight, be able to move more quickly with ADLs and IADLs.  Pt independent with dressing,  showering, driving.  MD gave permission to pt to return to driving with tennis shoe.  Pt reports mild pain when driving when transitioning from brake to gas pedal.   Pt denies any falls since the surgery.  Pt denies any change in weight, night sweats.  Pt able to ambulate up to 50 yards max before needing to sit due to significant pain. Pt is anticipating having same surgery on L foot in the future. Pt has custom orthotic in L shoe.     Limitations  Walking;House hold activities;Lifting;Standing    How long can you sit comfortably?  Unlimited    How long can you stand comfortably?  1 hour    How long can you walk comfortably?     Diagnostic tests  X-ray post op, see chart     Currently in Pain?  No/denies     Pain Onset  More than a month ago                Ther-Ex -BAPS board Level 4 PF to float position 2x 15 -BAPS board Level 4 PF > DF w/ floating the board 2x 15 -BAPS board Level 4 circles touching rim of board down 2x 15e direction -Bilat heel raises w/ UE support at stair with heels off step focus on eccentric lowering 3x 12 w/ cuing for eccentric control and proper form -Sidestep on foam pad 79ft down and back 2x x3reps with "hover UE" support at treadmill bar w/ mini squat position w/ PT CGA w/ increased fatigue between sets -Standing hip abd at treadmill bar with stance leg on airex pad and bilat UE support 3x 10 each with SBA from PT and cuing for for   AROM measured from 90d neutral ankle PF 29d DF 11d GT ext 28d with PT overpressure: PF 34d DF 15d GT ext 33d             PT Education - 04/25/17 1508    Education provided  Yes    Education Details  Exercise form, new POC     Person(s) Educated  Patient    Methods  Explanation;Demonstration;Tactile cues;Handout    Comprehension  Verbalized understanding;Returned demonstration;Verbal cues required;Tactile cues required       PT Short Term Goals - 03/23/17 0953      PT SHORT TERM GOAL #1   Title  Pt will be independent with HEP for improved carryover between sessions    Time  2    Period  Weeks    Status  Achieved        PT Long Term Goals - 03/23/17 4540      PT LONG TERM GOAL #1   Title  Pt's LEFS score will improve to at least 50/80 to demonstrate improved functional use of RLE    Baseline  29/80, 03/09/17: 45/80 on 2/1    Time  6    Period  Weeks    Status  On-going      PT LONG TERM GOAL #2   Title  Pt will be able to ambulate with minimal>no gait abnormalities to return to walking for exercise    Baseline  See Evaluation; 03/09/17: Pt still ambulates with R antalgic gait, decrease forefoot loading and limited DF on RLE    Time  6    Period  Weeks      PT LONG TERM GOAL #3   Title  Pt will  be able to stand for at least 30 minutes without pain  for return to work    Baseline  5 minutes, 03/09/17: 10 minutes 2/1    Time  5    Period  Weeks    Status  On-going    Target Date  05/04/17      PT LONG TERM GOAL #4   Title  Pt's R ankle AROM will improve to [facility] for improved functional use of RLE    Baseline  See Evaluation, 03/09/17: DF: 5, PF: 45, Inv/Ev: measures not saved/to be rechecked    Time  5    Period  Weeks    Status  On-going      PT LONG TERM GOAL #5   Title  Pt's will improve to at least 1.0 m/s to return to community ambulation    Baseline  0.32 m/s;    Time  4    Period  Weeks    Status  On-going            Plan - 04/25/17 1519    Clinical Impression Statement  Patient tolerated therex progression well today with noted fatigue, and was able to complete balance exercises with more confedience. Patient has full passive ROM at this time, but is still having some deficit in AROM, d/t weakness. Patient has enough AROM needed for normalized gait and is near normal gait ambulating through the gym, but lacks strength for endurance tasks (prolonged walking, balance). Patient is being seen 2x next week and discussed coming to PT 1x week following next week, so that PT can assess her function at work as well when she returns and to continue balance exercises. PT and patient agreed on this plan and will move forward as able.      PT Frequency  2x / week    PT Duration  8 weeks    PT Treatment/Interventions  ADLs/Self Care Home Management;Aquatic Therapy;Biofeedback;Cryotherapy;Iontophoresis 4mg /ml Dexamethasone;Electrical Stimulation;Moist Heat;Traction;Ultrasound;Parrafin;Fluidtherapy;Contrast Bath;DME Instruction;Gait training;Stair training;Functional mobility training;Therapeutic activities;Therapeutic exercise;Balance training;Neuromuscular re-education;Patient/family education;Orthotic Fit/Training;Manual techniques;Compression bandaging;Scar mobilization;Passive  range of motion;Dry needling;Energy conservation;Taping    PT Next Visit Plan  Progress therex and balance as tolerated;     PT Home Exercise Plan  sidelying hip abd and prone hip ext added 3/4; ant tib stretch added 2/20; R ankle AROM DF/PF/Inv/Ev with red tband, seated heel raises with manual resistance, R single leg balance, icing, ambulating with proper gait mechanics as documented, massage R foot/ankle for decreased swelling    Consulted and Agree with Plan of Care  Patient       Patient will benefit from skilled therapeutic intervention in order to improve the following deficits and impairments:  Abnormal gait, Decreased activity tolerance, Decreased balance, Decreased endurance, Decreased knowledge of precautions, Decreased knowledge of use of DME, Decreased mobility, Decreased range of motion, Decreased safety awareness, Decreased scar mobility, Decreased strength, Difficulty walking, Hypomobility, Increased edema, Increased fascial restricitons, Increased muscle spasms, Impaired perceived functional ability, Impaired flexibility, Improper body mechanics, Postural dysfunction, Obesity, Pain  Visit Diagnosis: Pain in right ankle and joints of right foot     Problem List Patient Active Problem List   Diagnosis Date Noted  . Post-op pain 12/01/2016  . 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 Flat foot 01/28/2016  . Aortic sclerosis 10/14/2015  . Midline low back pain without sciatica 09/20/2015  . Leukocytosis 11/29/2014  . Osteoarthritis of left knee 11/20/2014  . History of ventral hernia repair 10/27/2014  . Allergic rhinitis 08/17/2014  . Axillary hidradenitis suppurativa 08/17/2014  . Baker cyst  08/17/2014  . Chronic constipation 08/17/2014  . 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 Engages in travel abroad 08/17/2014  . Fibromyalgia 08/17/2014  . Cardiac murmur 08/17/2014  . Anemia, iron deficiency 08/17/2014  . Excessive and frequent menstruation  08/17/2014  . Dysmetabolic syndrome 08/17/2014  . Plantar fasciitis 08/17/2014  . Beat, premature ventricular 08/17/2014  . 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 Obstructive apnea 12/02/2013  . Essential (primary) hypertension 12/09/2009  . Hypertrichosis 12/09/2009  . Extreme obesity 12/09/2009   Staci Acostahelsea Miller PT, DPT Staci Acostahelsea Miller 04/25/2017, 3:26 PM  [facility] [address] [place], [place], [address] Phone: [phone]   Fax:  [phone]  Name: Elvera Marializabeth Eisenberger MRN: [id number] Date of Birth: 12-23-1970

## 2017-04-30 ENCOUNTER — Ambulatory Visit: Payer: 59 | Admitting: Physical Therapy

## 2017-05-02 ENCOUNTER — Ambulatory Visit: Payer: 59 | Admitting: Physical Therapy

## 2017-05-08 ENCOUNTER — Ambulatory Visit: Payer: 59 | Admitting: Family Medicine

## 2017-05-10 ENCOUNTER — Encounter: Payer: Self-pay | Admitting: Physical Therapy

## 2017-05-10 ENCOUNTER — Ambulatory Visit: Payer: 59 | Admitting: Physical Therapy

## 2017-05-10 DIAGNOSIS — M25571 Pain in right ankle and joints of right foot: Secondary | ICD-10-CM

## 2017-05-10 NOTE — Therapy (Signed)
[facility] [address] [place], [address] Phone: [phone]   Fax:  [phone]  Physical Therapy Treatment  Patient Details  Name: Kathleen Conley MRN: [id number] Date of Birth: 1970/02/22 Referring Provider: Samara Deist   Encounter Date: 05/10/2017  PT End of Session - 05/10/17 1332    Visit Number  21    Number of Visits  25    Date for PT Re-Evaluation  06/07/17    PT Start Time  0111    PT Stop Time  0143    PT Time Calculation (min)  32 min    Equipment Utilized During Treatment  Other (comment)    Activity Tolerance  Patient tolerated treatment well;No increased pain    Behavior During Therapy  WFL for tasks assessed/performed       Past Medical History:  Diagnosis Date  . 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 Allergy   . Anemia    Iron Deficiency  . Arthritis   . Baker cyst   . Breast mass, right 01/18/2010  . Cardiac murmur   . Chronic constipation   . Depression   . Dysmetabolic syndrome   . Fibromyalgia   . Hairiness   . Hydradenitis   . Hypertension   . Incarcerated ventral hernia 09/29/2011  . Obesity   . Obstructive sleep apnea    no CPAP  . Plantar fasciitis   . PVC (premature ventricular contraction)     Past Surgical History:  Procedure Laterality Date  . FOOT ARTHRODESIS Right 12/01/2016   Procedure: ARTHRODESIS FOOT; TRIPLE;  Surgeon: Samara Deist, DPM;  Location: [facility] ORS;  Service: Podiatry;  Laterality: Right;  . HERNIA REPAIR  10/09/11   Ventral Incarcerated- Dr. Pat Patrick  . OOPHORECTOMY Left 12/09/2009  . TUBAL LIGATION  1993  . VENTRAL HERNIA REPAIR  10/09/2011    There were no vitals filed for this visit.  Subjective Assessment - 05/10/17 1313    Subjective  Pt reports she is having no pain, and is feeling good ambulating in the community. Patient reports she still feels difficulty with balance during HEP balance  exercises, but feels steady on her feet with all ADLs and community ambulation at this time.     Pertinent History  Pt is a 47 y/o F s/p reconstruction of R foot and ankle. 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 On 12/01/16 pt underwent triple arthrodesis RLE, percutaneous teno achilles lengthening RLE. Pt with soft splint R ankle following surgery and was NWB. Pt using knee walker in hospital due to poor balance with RW. Pt had appointment with Dr. Vickki Muff on 12/4 who instructed pt to begin [facility] starting with 25% and increasing thereafter. Pt reports she was instructed to WBAT with RW or crutches. Pt used to walk 1-2 miles prior to surgery for exercise before pain became too severe.  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  Pt has been off work since end of September 2018, Dr. Vickki Muff suspects potentially going back to work mid February depending on progress with PT.  Pt uses crutches in community and RW in her apartment.  Easing factors: sitting down/lying down, tylenol.  Aggravating factors: [place] activities.  Pt has not been using ice or heat.  Pt's therapy goals: return to work, walking for exercise to lose weight, be able to move  more quickly with ADLs and IADLs.  Pt independent with dressing, showering, driving.  MD gave permission to pt to return to driving with tennis shoe.  Pt reports mild pain when driving when transitioning from brake to gas pedal.   Pt denies any falls since the surgery.  Pt denies any change in weight, night sweats.  Pt able to ambulate up to 50 yards max before needing to sit due to significant pain. Pt is anticipating having same surgery on L foot in the future. Pt has custom orthotic in L shoe.     Limitations  Walking;House hold activities;Lifting;Standing    How long can you sit comfortably?  Unlimited    How long can you stand comfortably?  1 hour    How long can you walk comfortably?   4mn    Diagnostic tests  X-ray post op, see chart     Patient Stated Goals  return to work, return to walking for exercise, be able to complete her ADLs and IADLs more quickly.    Pain Onset  More than a month ago            Ther-Ex - Heel raise exercise 3x 10 demonstrating symmetrical heel raise bilat needing minimal PT cuing - Multiple trials of SLS balance with patient able to balance on R LE for 3sec without support and L LE for 4 sec w/o support - Patient led through multiple rounds of gait trails where she was able to increased gait much closer to "normal" than previous sessions with mild eversion noted bilat (which patient reports is normal for her) -HEP review with condensing exercises to 1 printout reviewing with demonstration of the following:  - Ant tib stretch 30sec hold  - DF stretch on wall 30sec hold  - Standing heel raises 3x 10 3x/week  - Heel walks 164f3x 10 3x/week  - Standing hip abd 3x 10 3x/week  - Standing hip ext 3x 10 3x/week                   PT Education - 05/10/17 1331    Education provided  Yes    Education Details  HEP review, d/c recommendations, gait training    Person(s) Educated  Patient    Methods  Explanation;Demonstration;Tactile cues;Verbal cues    Comprehension  Verbal cues required;Tactile cues required;Returned demonstration;Verbalized understanding       PT Short Term Goals - 05/10/17 1315      PT SHORT TERM GOAL #1   Title  Pt will be independent with HEP for improved carryover between sessions    Time  2    Period  Weeks    Status  Achieved        PT Long Term Goals - 05/10/17 1315      PT LONG TERM GOAL #1   Title  Pt's LEFS score will improve to at least 50/80 to demonstrate improved functional use of RLE    Baseline  3/21: 65/80    Time  6    Period  Weeks    Status  Achieved      PT LONG TERM GOAL #2   Title  Pt will be able to ambulate with minimal>no gait abnormalities to return to walking for  exercise    Baseline  3/21 patient walks with even step/stride length, with proper toe push off, and active DF for foot clearance with slight bilat eversion    Time  6    Period  Weeks  Status  Achieved      PT LONG TERM GOAL #3   Title  Pt will be able to stand for at least 30 minutes without pain for return to work    Baseline  3/21 Patient reports she can stand for 1 hour with no pain    Time  5    Period  Weeks    Status  Achieved      PT LONG TERM GOAL #4   Title  Pt's R ankle AROM will improve to [facility] for improved functional use of RLE    Baseline  3/21: from neutral DF: 14d PF: 37d    Time  5    Period  Weeks    Status  Achieved      PT LONG TERM GOAL #5   Title  Pt's 61mT will improve to at least 1.0 m/s to return to community ambulation    Baseline  3/21 0.957m    Time  4    Status  Partially Met            Plan - 05/10/17 1353    Clinical Impression Statement  Patient reports she is doing very well. Patient has met all goals with mild gait deficits that this PT believes are norm for her. PT condensed patient's HEP to 6 exercises and balance and gait training to maintain PT progress. Patient is tentively d/c at this point, keeping appointments on schedule as she goes back to work Monday, to ensure she is able to transition back into this role smoothly. Patient has been instructed to inform clinic if she wishes to return next week in case there is something at her job she realizes she can not do,     PT Frequency  2x / week    PT Duration  8 weeks    PT Treatment/Interventions  ADLs/Self Care Home Management;Aquatic Therapy;Biofeedback;Cryotherapy;Iontophoresis '4mg'$ /ml Dexamethasone;Electrical Stimulation;Moist Heat;Traction;Ultrasound;Parrafin;Fluidtherapy;Contrast Bath;DME Instruction;Gait training;Stair training;Functional mobility training;Therapeutic activities;Therapeutic exercise;Balance training;Neuromuscular re-education;Patient/family education;Orthotic  Fit/Training;Manual techniques;Compression bandaging;Scar mobilization;Passive range of motion;Dry needling;Energy conservation;Taping    PT Next Visit Plan  Progress therex and balance as tolerated;     PT Home Exercise Plan  sidelying hip abd and prone hip ext added 3/4; ant tib stretch added 2/20; R ankle AROM DF/PF/Inv/Ev with red tband, seated heel raises with manual resistance, R single leg balance, icing, ambulating with proper gait mechanics as documented, massage R foot/ankle for decreased swelling    Consulted and Agree with Plan of Care  Patient       Patient will benefit from skilled therapeutic intervention in order to improve the following deficits and impairments:  Abnormal gait, Decreased activity tolerance, Decreased balance, Decreased endurance, Decreased knowledge of precautions, Decreased knowledge of use of DME, Decreased mobility, Decreased range of motion, Decreased safety awareness, Decreased scar mobility, Decreased strength, Difficulty walking, Hypomobility, Increased edema, Increased fascial restricitons, Increased muscle spasms, Impaired perceived functional ability, Impaired flexibility, Improper body mechanics, Postural dysfunction, Obesity, Pain  Visit Diagnosis: Pain in right ankle and joints of right foot     Problem List Patient Active Problem List   Diagnosis Date Noted  . Post-op pain 12/01/2016  . 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 Flat foot 01/28/2016  . Aortic sclerosis 10/14/2015  . Midline low back pain without sciatica 09/20/2015  . Leukocytosis 11/29/2014  . Osteoarthritis of left knee 11/20/2014  . History of ventral hernia repair 10/27/2014  . Allergic rhinitis 08/17/2014  . Axillary hidradenitis suppurativa 08/17/2014  .  Baker cyst 08/17/2014  . Chronic constipation 08/17/2014  . Depression, major, recurrent, mild ([place]) 08/17/2014  . Engages in travel abroad 08/17/2014  . Fibromyalgia 08/17/2014  . Cardiac murmur 08/17/2014   . Anemia, iron deficiency 08/17/2014  . Excessive and frequent menstruation 08/17/2014  . Dysmetabolic syndrome 93/38/8266  . Plantar fasciitis 08/17/2014  . Beat, premature ventricular 08/17/2014  . 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 Obstructive apnea 12/02/2013  . Essential (primary) hypertension 12/09/2009  . Hypertrichosis 12/09/2009  . Extreme obesity 12/09/2009   Shelton Silvas PT, DPT  Shelton Silvas 05/10/2017, 2:23 PM  [place] [facility] [address] [place], [address] Phone: [phone]   Fax:  [phone]  Name: Kathleen Conley MRN: [id number] Date of Birth: [birth year]

## 2017-05-17 ENCOUNTER — Ambulatory Visit: Payer: 59 | Admitting: Physical Therapy

## 2017-05-24 ENCOUNTER — Ambulatory Visit: Payer: 59 | Admitting: Physical Therapy

## 2017-05-31 ENCOUNTER — Encounter: Payer: 59 | Admitting: Physical Therapy

## 2017-06-04 ENCOUNTER — Ambulatory Visit: Payer: Self-pay | Admitting: Obstetrics and Gynecology

## 2017-06-07 ENCOUNTER — Encounter: Payer: 59 | Admitting: Physical Therapy

## 2017-06-14 ENCOUNTER — Encounter: Payer: Self-pay | Admitting: Physical Therapy

## 2017-06-19 DIAGNOSIS — H524 Presbyopia: Secondary | ICD-10-CM | POA: Diagnosis not present

## 2017-06-19 DIAGNOSIS — H401131 Primary open-angle glaucoma, bilateral, mild stage: Secondary | ICD-10-CM | POA: Diagnosis not present

## 2017-07-17 DIAGNOSIS — M79671 Pain in right foot: Secondary | ICD-10-CM | POA: Diagnosis not present

## 2017-07-17 DIAGNOSIS — M76829 Posterior tibial tendinitis, unspecified leg: Secondary | ICD-10-CM | POA: Diagnosis not present

## 2017-08-21 DIAGNOSIS — H5210 Myopia, unspecified eye: Secondary | ICD-10-CM

## 2017-08-21 DIAGNOSIS — H4010X1 Unspecified open-angle glaucoma, mild stage: Secondary | ICD-10-CM

## 2017-08-21 DIAGNOSIS — H401231 Low-tension glaucoma, bilateral, mild stage: Secondary | ICD-10-CM

## 2017-08-21 DIAGNOSIS — H52209 Unspecified astigmatism, unspecified eye: Secondary | ICD-10-CM

## 2017-08-21 HISTORY — DX: Low-tension glaucoma, bilateral, mild stage: H40.1231

## 2017-08-21 HISTORY — DX: Unspecified astigmatism, unspecified eye: H52.209

## 2017-08-21 HISTORY — DX: Unspecified open-angle glaucoma, mild stage: H40.10X1

## 2017-08-21 HISTORY — DX: Myopia, unspecified eye: H52.10

## 2017-08-21 LAB — HM DIABETES EYE EXAM

## 2017-09-06 ENCOUNTER — Other Ambulatory Visit: Payer: Self-pay | Admitting: Family Medicine

## 2017-09-06 DIAGNOSIS — J302 Other seasonal allergic rhinitis: Secondary | ICD-10-CM

## 2017-09-14 ENCOUNTER — Other Ambulatory Visit: Payer: Self-pay | Admitting: Family Medicine

## 2017-09-14 DIAGNOSIS — I10 Essential (primary) hypertension: Secondary | ICD-10-CM

## 2017-09-25 DIAGNOSIS — M76829 Posterior tibial tendinitis, unspecified leg: Secondary | ICD-10-CM | POA: Diagnosis not present

## 2017-09-25 DIAGNOSIS — M25572 Pain in left ankle and joints of left foot: Secondary | ICD-10-CM | POA: Diagnosis not present

## 2017-10-10 ENCOUNTER — Encounter: Payer: Self-pay | Admitting: Family Medicine

## 2017-10-10 ENCOUNTER — Ambulatory Visit: Payer: 59 | Admitting: Family Medicine

## 2017-10-10 VITALS — BP 128/70 | HR 87 | Temp 98.5°F | Resp 16 | Ht 62.0 in | Wt 247.4 lb

## 2017-10-10 DIAGNOSIS — F5102 Adjustment insomnia: Secondary | ICD-10-CM

## 2017-10-10 DIAGNOSIS — E8881 Metabolic syndrome: Secondary | ICD-10-CM | POA: Diagnosis not present

## 2017-10-10 DIAGNOSIS — Z23 Encounter for immunization: Secondary | ICD-10-CM

## 2017-10-10 DIAGNOSIS — I10 Essential (primary) hypertension: Secondary | ICD-10-CM | POA: Diagnosis not present

## 2017-10-10 DIAGNOSIS — G4733 Obstructive sleep apnea (adult) (pediatric): Secondary | ICD-10-CM | POA: Diagnosis not present

## 2017-10-10 DIAGNOSIS — G4726 Circadian rhythm sleep disorder, shift work type: Secondary | ICD-10-CM | POA: Diagnosis not present

## 2017-10-10 DIAGNOSIS — M797 Fibromyalgia: Secondary | ICD-10-CM | POA: Diagnosis not present

## 2017-10-10 DIAGNOSIS — F330 Major depressive disorder, recurrent, mild: Secondary | ICD-10-CM | POA: Diagnosis not present

## 2017-10-10 MED ORDER — ARMODAFINIL 150 MG PO TABS: 150.0000 mg | ORAL_TABLET | Freq: Every day | ORAL | 2 refills | Status: DC

## 2017-10-10 MED ORDER — OLMESARTAN MEDOXOMIL 40 MG PO TABS: 40.0000 mg | ORAL_TABLET | Freq: Every day | ORAL | 1 refills | Status: DC

## 2017-10-10 MED ORDER — DULOXETINE HCL 60 MG PO CPEP: 60.0000 mg | ORAL_CAPSULE | Freq: Every day | ORAL | 0 refills | Status: DC

## 2017-10-10 MED ORDER — TRAZODONE HCL 50 MG PO TABS: 25.0000 mg | ORAL_TABLET | Freq: Every evening | ORAL | 2 refills | Status: DC | PRN

## 2017-10-10 MED ORDER — METAXALONE 800 MG PO TABS: 800.0000 mg | ORAL_TABLET | Freq: Three times a day (TID) | ORAL | 1 refills | Status: DC | PRN

## 2017-10-10 NOTE — Progress Notes (Signed)
Name: Kathleen Conley   MRN: [id number]    DOB: 1970/03/17   Date:10/10/2017       Progress Note  Subjective  Chief Complaint  Chief Complaint  Patient presents with  . Medication Refill  . Depression  . Hypertension    Denies any symptoms  . Fibromyalgia    States it is better with Cymbalta and Muscle Relaxer  . Obesity  . Sleep Apnea    Did not ever start taking Adderall due to side effects but would like a refill of Nuvigil.     HPI  Major depression: phq is back to baseline. She was very depressed this Spring likely from seasonal changes, not working/she on FMLA for foot surgery, we added Wellbutrin to Duloxetine, but she states mood seemed to improve when she return to work . Today phq 9 is 1, we will resume wellbutrin during fall, continue duloxetine for now  Metabolic Syndrome and Obesity: we tried giving her Trulicity but needs step therapy, she failed Metformin. She denies polyphagia, polyuria or polydipsia.   Obesity: she has a long history of obesity, started after her divorce when she was in her 62's. She tried weight watchers but was unsuccessful. She is a stress eater, she is now working 2nd shift instead of 3rd, but goes home and watches TV until late and sleeps in the am. Discussed life style modification   FMS: she states pain is better with Cymbalta, could not tolerate Lyrica ( caused nightmares and vivid dreams), pain level on Cymbalta is 0/10, she takes skelaxin about once a day, it helps with muscle pain.   OSA: she has mild symptoms, not on CPAP, sleeping on her side, she is not waking up with headaches. She also has shift work sleep disorder and states nuvigil helps her stay awake.   HTN: well controlled, taking Benicar and denies side effects, no chest pain or SOB. Palpitation resolved. Needs refill of medication   Insomnia: she is willing to try trazodone to regulate her sleep cycle, advised to take it as soon as she gets home   Patient Active  Problem List   Diagnosis Date Noted  . Post-op pain 12/01/2016  . 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 Flat foot 01/28/2016  . Aortic sclerosis 10/14/2015  . Midline low back pain without sciatica 09/20/2015  . Leukocytosis 11/29/2014  . Osteoarthritis of left knee 11/20/2014  . History of ventral hernia repair 10/27/2014  . Allergic rhinitis 08/17/2014  . Axillary hidradenitis suppurativa 08/17/2014  . Baker cyst 08/17/2014  . Chronic constipation 08/17/2014  . 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 Engages in travel abroad 08/17/2014  . Fibromyalgia 08/17/2014  . Cardiac murmur 08/17/2014  . Anemia, iron deficiency 08/17/2014  . Excessive and frequent menstruation 08/17/2014  . Dysmetabolic syndrome 08/17/2014  . Plantar fasciitis 08/17/2014  . Beat, premature ventricular 08/17/2014  . 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 Obstructive apnea 12/02/2013  . Essential (primary) hypertension 12/09/2009  . Hypertrichosis 12/09/2009  . Extreme obesity 12/09/2009    Past Surgical History:  Procedure Laterality Date  . FOOT ARTHRODESIS Right 12/01/2016   Procedure: ARTHRODESIS FOOT; TRIPLE;  Surgeon: [name], DPM;  Location: [facility] ORS;  Service: Podiatry;  Laterality: Right;  . HERNIA REPAIR  10/09/11   Ventral Incarcerated- Dr. [name]  . OOPHORECTOMY Left 12/09/2009  . TUBAL LIGATION  1993  . VENTRAL HERNIA REPAIR  10/09/2011    Family History  Problem Relation  Age of Onset  . Hypertension Mother   . CVA Mother   . Kidney disease Mother   . Congenital heart disease Mother   . Heart disease Father   . Hypertension Father   . Diabetes Father   . Breast cancer Paternal Grandmother        Bilateral  . Colon cancer Maternal Grandfather   . Colon cancer Maternal Uncle     Social History   Socioeconomic History  . Marital status: Single    Spouse name: Not on file  . Number of children: 3   . Years of education: Associates  . Highest education level: Associate degree: academic program  Occupational History  . Occupation: LPN at the [employer]  Social Needs  . Financial resource strain: Not hard at all  . Food insecurity:    Worry: Never true    Inability: Never true  . Transportation needs:    Medical: No    Non-medical: No  Tobacco Use  . Smoking status: Never Smoker  . Smokeless tobacco: Never Used  Substance and Sexual Activity  . Alcohol use: No    Alcohol/week: 0.0 standard drinks  . Drug use: No  . Sexual activity: Never    Birth control/protection: IUD  Lifestyle  . Physical activity:    Days per week: 0 days    Minutes per session: Not on file  . Stress: Not at all  Relationships  . Social connections:    Talks on phone: Not on file    Gets together: Not on file    Attends religious service: Not on file    Active member of club or organization: Not on file    Attends meetings of clubs or organizations: Not on file    Relationship status: Not on file  . Intimate partner violence:    Fear of current or ex partner: Not on file    Emotionally abused: Not on file    Physically abused: Not on file    Forced sexual activity: Not on file  Other Topics Concern  . Not on file  Social History Narrative  . Not on file     Current Outpatient Medications:  .  acetaminophen (TYLENOL) 500 MG tablet, Take 1 tablet (500 mg total) by mouth every 6 (six) hours as needed. Max of 3 grams daily or 6 pills dialy (Patient taking differently: Take 1,000 mg by mouth every 6 (six) hours as needed for moderate pain or headache. ), Disp: 30 tablet, Rfl: 0 .  Armodafinil (NUVIGIL) 150 MG tablet, Take 1 tablet (150 mg total) by mouth daily., Disp: 30 tablet, Rfl: 2 .  aspirin EC 81 MG tablet, Take 1 tablet (81 mg total) by mouth daily., Disp: 30 tablet, Rfl: 0 .  Cetirizine HCl (ZYRTEC ALLERGY) 10 MG CAPS, Take 1 capsule by mouth as needed., Disp: , Rfl:  .   Cholecalciferol (VITAMIN D) 2000 UNITS tablet, Take 2,000 Units by mouth daily. , Disp: , Rfl:  .  docusate sodium (COLACE) 100 MG capsule, Take 100 mg by mouth daily as needed for moderate constipation. , Disp: , Rfl:  .  DULoxetine (CYMBALTA) 60 MG capsule, Take 1 capsule (60 mg total) by mouth daily., Disp: 90 capsule, Rfl: 0 .  ferrous sulfate 325 (65 FE) MG tablet, Take 325 mg by mouth daily. , Disp: , Rfl:  .  fluticasone (FLONASE) 50 MCG/ACT nasal spray, Place 2 sprays into both nostrils daily as needed for allergies., Disp: 16  g, Rfl: 1 .  ibuprofen (ADVIL,MOTRIN) 200 MG tablet, Take 400 mg by mouth every 6 (six) hours as needed for headache or moderate pain., Disp: , Rfl:  .  levonorgestrel (MIRENA, 52 MG,) 20 MCG/24HR IUD, 1 each by Intrauterine route once. , Disp: , Rfl:  .  metaxalone (SKELAXIN) 800 MG tablet, Take 1 tablet (800 mg total) by mouth 3 (three) times daily as needed for muscle spasms., Disp: 90 tablet, Rfl: 1 .  Multiple Vitamins-Minerals (MULTIVITAMIN PO), Take 1 tablet by mouth daily., Disp: , Rfl:  .  olmesartan (BENICAR) 40 MG tablet, Take 1 tablet (40 mg total) by mouth daily., Disp: 90 tablet, Rfl: 1 .  TRAVATAN Z 0.004 % SOLN ophthalmic solution, Place 1 drop into both eyes at bedtime., Disp: , Rfl: 3 .  loratadine (CLARITIN) 10 MG tablet, Take 1 tablet (10 mg total) by mouth daily. (Patient not taking: Reported on 10/10/2017), Disp: 30 tablet, Rfl: 11 .  traZODone (DESYREL) 50 MG tablet, Take 0.5-1 tablets (25-50 mg total) by mouth at bedtime as needed for sleep., Disp: 30 tablet, Rfl: 2  Allergies  Allergen Reactions  . Hctz [Hydrochlorothiazide] Cough  . Lisinopril Cough          ROS  Constitutional: Negative for fever or weight change.  Respiratory: Negative for cough and shortness of breath.   Cardiovascular: Negative for chest pain or palpitations.  Gastrointestinal: Negative for abdominal pain, no bowel changes.  Musculoskeletal: positive  for gait  problem - history of right foot surgery, but states improving, no  joint swelling.  Skin: Negative for rash.  Neurological: Negative for dizziness or headache.  No other specific complaints in a complete review of systems (except as listed in HPI above).  Objective  Vitals:   10/10/17 0916  BP: 128/70  Pulse: 87  Resp: 16  Temp: 98.5 F (36.9 C)  TempSrc: Oral  SpO2: 99%  Weight: 247 lb 6.4 oz (112.2 kg)  Height: 5\' 2"  (1.575 m)    Body mass index is 45.25 kg/m.  Physical Exam  Constitutional: Patient appears well-developed and well-nourished. Obese  No distress.  HEENT: head atraumatic, normocephalic, pupils equal and reactive to light,neck supple, throat within normal limits Cardiovascular: Normal rate, regular rhythm and normal heart sounds.  No murmur heard. No BLE edema. Pulmonary/Chest: Effort normal and breath sounds normal. No respiratory distress. Abdominal: Soft.  There is no tenderness. Psychiatric: Patient has a normal mood and affect. behavior is normal. Judgment and thought content normal.  PHQ2/9: Depression screen [facility] 2/9 10/10/2017 04/05/2017 02/23/2017 11/28/2016 11/15/2016  Decreased Interest 0 3 0 0 0  Down, Depressed, Hopeless 0 3 0 0 0  PHQ - 2 Score 0 6 0 0 0  Altered sleeping 0 3 - - -  Tired, decreased energy 1 3 - - -  Change in appetite 0 2 - - -  Feeling bad or failure about yourself  0 3 - - -  Trouble concentrating 0 3 - - -  Moving slowly or fidgety/restless 0 0 - - -  Suicidal thoughts 0 0 - - -  PHQ-9 Score 1 20 - - -  Difficult doing work/chores Not difficult at all Very difficult - - -    Fall Risk: Fall Risk  10/10/2017 02/23/2017 11/28/2016 11/15/2016 07/03/2016  Falls in the past year? No No No No No    Functional Status Survey: Is the patient deaf or have difficulty hearing?: No Does the patient have difficulty seeing, even  when wearing glasses/contacts?: Yes(glasses) Does the patient have difficulty concentrating, remembering, or  making decisions?: No Does the patient have difficulty walking or climbing stairs?: Yes(Right Foot Surgery ) Does the patient have difficulty dressing or bathing?: No Does the patient have difficulty doing errands alone such as visiting a doctor's office or shopping?: No    Assessment & Plan  1. Obstructive apnea  - Armodafinil (NUVIGIL) 150 MG tablet; Take 1 tablet (150 mg total) by mouth daily.  Dispense: 30 tablet; Refill: 2  2. Needs flu shot  She will return in October for flu shot  3. Shift work sleep disorder  - Armodafinil (NUVIGIL) 150 MG tablet; Take 1 tablet (150 mg total) by mouth daily.  Dispense: 30 tablet; Refill: 2  4. Depression, major, recurrent, mild ([facility])  - DULoxetine (CYMBALTA) 60 MG capsule; Take 1 capsule (60 mg total) by mouth daily.  Dispense: 90 capsule; Refill: 0  5. Fibromyalgia  - DULoxetine (CYMBALTA) 60 MG capsule; Take 1 capsule (60 mg total) by mouth daily.  Dispense: 90 capsule; Refill: 0 - metaxalone (SKELAXIN) 800 MG tablet; Take 1 tablet (800 mg total) by mouth 3 (three) times daily as needed for muscle spasms.  Dispense: 90 tablet; Refill: 1  6. Essential (primary) hypertension  - olmesartan (BENICAR) 40 MG tablet; Take 1 tablet (40 mg total) by mouth daily.  Dispense: 90 tablet; Refill: 1  7. Dysmetabolic syndrome  She wants to recheck labs next visit   8. Adjustment insomnia  - traZODone (DESYREL) 50 MG tablet; Take 0.5-1 tablets (25-50 mg total) by mouth at bedtime as needed for sleep.  Dispense: 30 tablet; Refill: 2

## 2017-10-11 ENCOUNTER — Encounter: Payer: Self-pay | Admitting: Family Medicine

## 2017-12-13 ENCOUNTER — Ambulatory Visit (INDEPENDENT_AMBULATORY_CARE_PROVIDER_SITE_OTHER): Payer: 59 | Admitting: Family Medicine

## 2017-12-13 ENCOUNTER — Encounter: Payer: Self-pay | Admitting: Family Medicine

## 2017-12-13 VITALS — BP 168/94 | HR 95 | Temp 98.4°F | Resp 16 | Ht 61.0 in | Wt 240.9 lb

## 2017-12-13 DIAGNOSIS — I10 Essential (primary) hypertension: Secondary | ICD-10-CM

## 2017-12-13 DIAGNOSIS — Z1239 Encounter for other screening for malignant neoplasm of breast: Secondary | ICD-10-CM

## 2017-12-13 DIAGNOSIS — E785 Hyperlipidemia, unspecified: Secondary | ICD-10-CM

## 2017-12-13 DIAGNOSIS — E8881 Metabolic syndrome: Secondary | ICD-10-CM | POA: Diagnosis not present

## 2017-12-13 DIAGNOSIS — Z8639 Personal history of other endocrine, nutritional and metabolic disease: Secondary | ICD-10-CM

## 2017-12-13 DIAGNOSIS — Z01419 Encounter for gynecological examination (general) (routine) without abnormal findings: Secondary | ICD-10-CM

## 2017-12-13 DIAGNOSIS — D72829 Elevated white blood cell count, unspecified: Secondary | ICD-10-CM

## 2017-12-13 NOTE — Progress Notes (Signed)
Name: Kathleen Conley   MRN: [id number]    DOB: 10-Nov-1970   Date:12/17/2017       Progress Note  Subjective  Chief Complaint  Chief Complaint  Patient presents with  . Annual Exam    patient sleeps about 4hrs/ night. patient stated that she drinks water but her eating habits are not good.  . Labs Only    patient is fasting  . Orders    mammogram    HPI   Patient presents for annual CPE   Diet: skipping meals at night, works second shift, only eats once a day when working, advised to take healthy snacks Exercise: only walking at work       Office Visit from 12/13/2017 in [facility]  AUDIT-C Score  0     Depression:  Depression screen [facility] 2/9 12/13/2017 10/10/2017 04/05/2017 02/23/2017 11/28/2016  Decreased Interest 0 0 3 0 0  Down, Depressed, Hopeless 1 0 3 0 0  PHQ - 2 Score 1 0 6 0 0  Altered sleeping 2 0 3 - -  Tired, decreased energy 0 1 3 - -  Change in appetite 1 0 2 - -  Feeling bad or failure about yourself  0 0 3 - -  Trouble concentrating 0 0 3 - -  Moving slowly or fidgety/restless 0 0 0 - -  Suicidal thoughts 0 0 0 - -  PHQ-9 Score '4 1 20 ' - -  Difficult doing work/chores Not difficult at all Not difficult at all Very difficult - -    Hypertension: BP Readings from Last 3 Encounters:  12/13/17 (!) 168/94  10/10/17 128/70  04/05/17 128/82   Obesity: Wt Readings from Last 3 Encounters:  12/13/17 240 lb 14.4 oz (109.3 kg)  10/10/17 247 lb 6.4 oz (112.2 kg)  04/05/17 248 lb 1.6 oz (112.5 kg)   BMI Readings from Last 3 Encounters:  12/13/17 45.52 kg/m  10/10/17 45.25 kg/m  04/05/17 45.38 kg/m    Hep C Screening: today  STD testing and prevention (HIV/chl/gon/syphilis): N/A Intimate partner violence: negative screen  Sexual History/Pain during Intercourse: not sexually active for 3 years Menstrual History/LMP/Abnormal Bleeding: has IUD, still has some spotting intermittently Incontinence Symptoms:  She has some stress  incontinence, she is wearing panty liners - discussed referral to PT but she will try exercises at home first   Star: A voluntary discussion about advance care planning including the explanation and discussion of advance directives.  Discussed health care proxy and Living will, and the patient was able to identify a health care proxy as Dorene Sorrow - her daughter.  Patient does not have a living will at present time.   Breast cancer: needs to have mammogram  BRCA gene screening: two family members on mother's side with colon cancer ( grandfather and uncle) also has breast cancer on father's side - paternal grandmother She is not interested  Cervical cancer screening: up to date   Lipids:  Lab Results  Component Value Date   CHOL 186 12/13/2017   CHOL 220 (H) 11/29/2016   CHOL 186 01/31/2016   Lab Results  Component Value Date   HDL 50 (L) 12/13/2017   HDL 55 11/29/2016   HDL 58 01/31/2016   Lab Results  Component Value Date   LDLCALC 118 (H) 12/13/2017   LDLCALC 138 (H) 11/29/2016   LDLCALC 107 (H) 01/31/2016   Lab Results  Component Value Date   TRIG 85 12/13/2017   TRIG  138 11/29/2016   TRIG 106 01/31/2016   Lab Results  Component Value Date   CHOLHDL 3.7 12/13/2017   CHOLHDL 4.0 11/29/2016   CHOLHDL 3.2 01/31/2016   No results found for: LDLDIRECT  Glucose:  Glucose  Date Value Ref Range Status  11/01/2012 92 65 - 99 mg/dL Final  10/07/2011 101 (H) 65 - 99 mg/dL Final  10/05/2011 210 (H) 65 - 99 mg/dL Final   Glucose, Bld  Date Value Ref Range Status  12/13/2017 96 65 - 99 mg/dL Final    Comment:    .            Fasting reference interval .   11/29/2016 83 65 - 99 mg/dL Final    Comment:    .            Fasting reference interval .   05/31/2016 105 (H) 65 - 99 mg/dL Final    Skin cancer: discussed atypical lesions  Colorectal cancer: discussed starting screening, but she would like to wait until age 40 Lung cancer:   Low Dose  CT Chest recommended if Age 63-80 years, 30 pack-year currently smoking OR have quit w/in 15years. Patient does not qualify.   ECG: 12/04/2016  Patient Active Problem List   Diagnosis Date Noted  . Post-op pain 12/01/2016  . 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 Flat foot 01/28/2016  . Aortic sclerosis 10/14/2015  . Midline low back pain without sciatica 09/20/2015  . Leukocytosis 11/29/2014  . Osteoarthritis of left knee 11/20/2014  . History of ventral hernia repair 10/27/2014  . Allergic rhinitis 08/17/2014  . Axillary hidradenitis suppurativa 08/17/2014  . Baker cyst 08/17/2014  . Chronic constipation 08/17/2014  . Depression, major, recurrent, mild ([place]) 08/17/2014  . Engages in travel abroad 08/17/2014  . Fibromyalgia 08/17/2014  . Cardiac murmur 08/17/2014  . Anemia, iron deficiency 08/17/2014  . Excessive and frequent menstruation 08/17/2014  . Dysmetabolic syndrome 32/99/2426  . Plantar fasciitis 08/17/2014  . Beat, premature ventricular 08/17/2014  . 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 Obstructive apnea 12/02/2013  . Essential (primary) hypertension 12/09/2009  . Hypertrichosis 12/09/2009  . Extreme obesity 12/09/2009    Past Surgical History:  Procedure Laterality Date  . FOOT ARTHRODESIS Right 12/01/2016   Procedure: ARTHRODESIS FOOT; TRIPLE;  Surgeon: Samara Deist, DPM;  Location: [facility] ORS;  Service: Podiatry;  Laterality: Right;  . HERNIA REPAIR  10/09/11   Ventral Incarcerated- Dr. Pat Patrick  . OOPHORECTOMY Left 12/09/2009  . TUBAL LIGATION  1993  . VENTRAL HERNIA REPAIR  10/09/2011    Family History  Problem Relation Age of Onset  . Hypertension Mother   . CVA Mother   . Kidney disease Mother   . Congenital heart disease Mother   . Heart disease Father   . Hypertension Father   . Diabetes Father   . Breast cancer Paternal Grandmother        Bilateral  . Colon cancer Maternal Grandfather    . Colon cancer Maternal Uncle     Social History   Socioeconomic History  . Marital status: Single    Spouse name: Not on file  . Number of children: 3  . Years of education: Associates  . Highest education level: Associate degree: academic program  Occupational History  . Occupation: LPN at the [employer]  . Financial resource strain: Not hard at all  . Food insecurity:    Worry: Never true  Inability: Never true  . Transportation needs:    Medical: No    Non-medical: No  Tobacco Use  . Smoking status: Never Smoker  . Smokeless tobacco: Never Used  Substance and Sexual Activity  . Alcohol use: No    Alcohol/week: 0.0 standard drinks  . Drug use: No  . Sexual activity: Not Currently    Partners: Male    Birth control/protection: IUD  Lifestyle  . Physical activity:    Days per week: 0 days    Minutes per session: Not on file  . Stress: Not at all  Relationships  . Social connections:    Talks on phone: More than three times a week    Gets together: Never    Attends religious service: More than 4 times per year    Active member of club or organization: No    Attends meetings of clubs or organizations: Never    Relationship status: Never married  . Intimate partner violence:    Fear of current or ex partner: No    Emotionally abused: No    Physically abused: No    Forced sexual activity: No  Other Topics Concern  . Not on file  Social History Narrative  . Not on file     Current Outpatient Medications:  .  acetaminophen (TYLENOL) 500 MG tablet, Take 1 tablet (500 mg total) by mouth every 6 (six) hours as needed. Max of 3 grams daily or 6 pills dialy (Patient taking differently: Take 1,000 mg by mouth every 6 (six) hours as needed for moderate pain or headache. ), Disp: 30 tablet, Rfl: 0 .  Armodafinil (NUVIGIL) 150 MG tablet, Take 1 tablet (150 mg total) by mouth daily., Disp: 30 tablet, Rfl: 2 .  aspirin EC 81 MG tablet, Take 1  tablet (81 mg total) by mouth daily., Disp: 30 tablet, Rfl: 0 .  Cetirizine HCl (ZYRTEC ALLERGY) 10 MG CAPS, Take 1 capsule by mouth as needed., Disp: , Rfl:  .  Cholecalciferol (VITAMIN D) 2000 UNITS tablet, Take 2,000 Units by mouth daily. , Disp: , Rfl:  .  docusate sodium (COLACE) 100 MG capsule, Take 100 mg by mouth daily as needed for moderate constipation. , Disp: , Rfl:  .  DULoxetine (CYMBALTA) 60 MG capsule, Take 1 capsule (60 mg total) by mouth daily., Disp: 90 capsule, Rfl: 0 .  ferrous sulfate 325 (65 FE) MG tablet, Take 325 mg by mouth daily. , Disp: , Rfl:  .  fluticasone (FLONASE) 50 MCG/ACT nasal spray, Place 2 sprays into both nostrils daily as needed for allergies., Disp: 16 g, Rfl: 1 .  ibuprofen (ADVIL,MOTRIN) 200 MG tablet, Take 400 mg by mouth every 6 (six) hours as needed for headache or moderate pain., Disp: , Rfl:  .  levonorgestrel (MIRENA, 52 MG,) 20 MCG/24HR IUD, 1 each by Intrauterine route once. , Disp: , Rfl:  .  loratadine (CLARITIN) 10 MG tablet, Take 1 tablet (10 mg total) by mouth daily. (Patient not taking: Reported on 10/10/2017), Disp: 30 tablet, Rfl: 11 .  metaxalone (SKELAXIN) 800 MG tablet, Take 1 tablet (800 mg total) by mouth 3 (three) times daily as needed for muscle spasms., Disp: 90 tablet, Rfl: 1 .  Multiple Vitamins-Minerals (MULTIVITAMIN PO), Take 1 tablet by mouth daily., Disp: , Rfl:  .  olmesartan (BENICAR) 40 MG tablet, Take 1 tablet (40 mg total) by mouth daily., Disp: 90 tablet, Rfl: 1 .  TRAVATAN Z 0.004 % SOLN ophthalmic solution, Place 1  drop into both eyes at bedtime., Disp: , Rfl: 3 .  traZODone (DESYREL) 50 MG tablet, Take 0.5-1 tablets (25-50 mg total) by mouth at bedtime as needed for sleep., Disp: 30 tablet, Rfl: 2  Allergies  Allergen Reactions  . Hctz [Hydrochlorothiazide] Cough  . Lisinopril Cough          ROS  Constitutional: Negative for fever , positive for mild  weight change.  Respiratory: Negative for cough and  shortness of breath.   Cardiovascular: Negative for chest pain or palpitations.  Gastrointestinal: Negative for abdominal pain, no bowel changes.  Musculoskeletal: Negative for gait problem or joint swelling.  Skin: Negative for rash.  Neurological: Negative for dizziness or headache.  No other specific complaints in a complete review of systems (except as listed in HPI above).  Objective  Vitals:   12/13/17 0841 12/13/17 1041  BP: (!) 166/98 (!) 168/94  Pulse:  95  Resp:  16  Temp:  98.4 F (36.9 C)  TempSrc:  Oral  SpO2:  95%  Weight:  240 lb 14.4 oz (109.3 kg)  Height:  '5\' 1"'  (1.549 m)    Body mass index is 45.52 kg/m.  Physical Exam  Constitutional: Patient appears well-developed and obese .No distress.  HENT: Head: Normocephalic and atraumatic. Ears: B TMs ok, no erythema or effusion; Nose: Nose normal. Mouth/Throat: Oropharynx is clear and moist. No oropharyngeal exudate.  Eyes: Conjunctivae and EOM are normal. Pupils are equal, round, and reactive to light. No scleral icterus.  Neck: Normal range of motion. Neck supple. No JVD present. No thyromegaly present.  Cardiovascular: Normal rate, regular rhythm and normal heart sounds.  No murmur heard. No BLE edema. Pulmonary/Chest: Effort normal and breath sounds normal. No respiratory distress. Abdominal: Soft. Bowel sounds are normal, no distension. There is no tenderness. no masses Breast: no lumps or masses, no nipple discharge or rashes FEMALE GENITALIA:  External genitalia normal External urethra normal Vaginal vault normal without discharge or lesions Cervix normal without discharge or lesions Bimanual exam normal without masses RECTAL: no rectal masses or hemorrhoids Musculoskeletal: Normal range of motion, no joint effusions.  Neurological: he is alert and oriented to person, place, and time. No cranial nerve deficit. Coordination, balance, strength, speech and gait are normal.  Skin: Skin is warm and dry. No  rash noted. No erythema.  Psychiatric: Patient has a normal mood and affect. behavior is normal. Judgment and thought content normal.  PHQ2/9: Depression screen [facility] 2/9 12/13/2017 10/10/2017 04/05/2017 02/23/2017 11/28/2016  Decreased Interest 0 0 3 0 0  Down, Depressed, Hopeless 1 0 3 0 0  PHQ - 2 Score 1 0 6 0 0  Altered sleeping 2 0 3 - -  Tired, decreased energy 0 1 3 - -  Change in appetite 1 0 2 - -  Feeling bad or failure about yourself  0 0 3 - -  Trouble concentrating 0 0 3 - -  Moving slowly or fidgety/restless 0 0 0 - -  Suicidal thoughts 0 0 0 - -  PHQ-9 Score '4 1 20 ' - -  Difficult doing work/chores Not difficult at all Not difficult at all Very difficult - -     Fall Risk: Fall Risk  12/13/2017 10/10/2017 02/23/2017 11/28/2016 11/15/2016  Falls in the past year? No No No No No     Functional Status Survey: Is the patient deaf or have difficulty hearing?: No Does the patient have difficulty seeing, even when wearing glasses/contacts?: No Does the patient  have difficulty concentrating, remembering, or making decisions?: No Does the patient have difficulty walking or climbing stairs?: Yes(at times due to stairs) Does the patient have difficulty dressing or bathing?: No Does the patient have difficulty doing errands alone such as visiting a doctor's office or shopping?: No   Assessment & Plan  1. Well woman exam  - Hemoglobin A1c - Lipid panel - COMPLETE METABOLIC PANEL WITH GFR - CBC with Differential/Platelet - Iron, TIBC and Ferritin Panel  2. Leukocytosis, unspecified type  - CBC with Differential/Platelet  3. History of iron deficiency  - CBC with Differential/Platelet - Iron, TIBC and Ferritin Panel  4. Dysmetabolic syndrome  - Hemoglobin A1c  5. Essential (primary) hypertension  bp is elevated today, usually at goal  6. Dyslipidemia  - Lipid panel   -USPSTF grade A and B recommendations reviewed with patient; age-appropriate recommendations,  preventive care, screening tests, etc discussed and encouraged; healthy living encouraged; see AVS for patient education given to patient -Discussed importance of 150 minutes of physical activity weekly, eat two servings of fish weekly, eat one serving of tree nuts ( cashews, pistachios, pecans, almonds.Marland Kitchen) every other day, eat 6 servings of fruit/vegetables daily and drink plenty of water and avoid sweet beverages.

## 2017-12-13 NOTE — Patient Instructions (Signed)
Preventive Care 40-64 Years, Female Preventive care refers to lifestyle choices and visits with your health care provider that can promote health and wellness. What does preventive care include?  A yearly physical exam. This is also called an annual well check.  Dental exams once or twice a year.  Routine eye exams. Ask your health care provider how often you should have your eyes checked.  Personal lifestyle choices, including: ? Daily care of your teeth and gums. ? Regular physical activity. ? Eating a healthy diet. ? Avoiding tobacco and drug use. ? Limiting alcohol use. ? Practicing safe sex. ? Taking low-dose aspirin daily starting at age 58. ? Taking vitamin and mineral supplements as recommended by your health care provider. What happens during an annual well check? The services and screenings done by your health care provider during your annual well check will depend on your age, overall health, lifestyle risk factors, and family history of disease. Counseling Your health care provider may ask you questions about your:  Alcohol use.  Tobacco use.  Drug use.  Emotional well-being.  Home and relationship well-being.  Sexual activity.  Eating habits.  Work and work Statistician.  Method of birth control.  Menstrual cycle.  Pregnancy history.  Screening You may have the following tests or measurements:  Height, weight, and BMI.  Blood pressure.  Lipid and cholesterol levels. These may be checked every 5 years, or more frequently if you are over 81 years old.  Skin check.  Lung cancer screening. You may have this screening every year starting at age 78 if you have a 30-pack-year history of smoking and currently smoke or have quit within the past 15 years.  Fecal occult blood test (FOBT) of the stool. You may have this test every year starting at age 65.  Flexible sigmoidoscopy or colonoscopy. You may have a sigmoidoscopy every 5 years or a colonoscopy  every 10 years starting at age 30.  Hepatitis C blood test.  Hepatitis B blood test.  Sexually transmitted disease (STD) testing.  Diabetes screening. This is done by checking your blood sugar (glucose) after you have not eaten for a while (fasting). You may have this done every 1-3 years.  Mammogram. This may be done every 1-2 years. Talk to your health care provider about when you should start having regular mammograms. This may depend on whether you have a family history of breast cancer.  BRCA-related cancer screening. This may be done if you have a family history of breast, ovarian, tubal, or peritoneal cancers.  Pelvic exam and Pap test. This may be done every 3 years starting at age 80. Starting at age 36, this may be done every 5 years if you have a Pap test in combination with an HPV test.  Bone density scan. This is done to screen for osteoporosis. You may have this scan if you are at high risk for osteoporosis.  Discuss your test results, treatment options, and if necessary, the need for more tests with your health care provider. Vaccines Your health care provider may recommend certain vaccines, such as:  Influenza vaccine. This is recommended every year.  Tetanus, diphtheria, and acellular pertussis (Tdap, Td) vaccine. You may need a Td booster every 10 years.  Varicella vaccine. You may need this if you have not been vaccinated.  Zoster vaccine. You may need this after age 5.  Measles, mumps, and rubella (MMR) vaccine. You may need at least one dose of MMR if you were born in  1959 or later. You may also need a second dose.  Pneumococcal 13-valent conjugate (PCV13) vaccine. You may need this if you have certain conditions and were not previously vaccinated.  Pneumococcal polysaccharide (PPSV23) vaccine. You may need one or two doses if you smoke cigarettes or if you have certain conditions.  Meningococcal vaccine. You may need this if you have certain  conditions.  Hepatitis A vaccine. You may need this if you have certain conditions or if you travel or work in places where you may be exposed to hepatitis A.  Hepatitis B vaccine. You may need this if you have certain conditions or if you travel or work in places where you may be exposed to hepatitis B.  Haemophilus influenzae type b (Hib) vaccine. You may need this if you have certain conditions.  Talk to your health care provider about which screenings and vaccines you need and how often you need them. This information is not intended to replace advice given to you by your health care provider. Make sure you discuss any questions you have with your health care provider. Document Released: 03/05/2015 Document Revised: 10/27/2015 Document Reviewed: 12/08/2014 Elsevier Interactive Patient Education  2018 [employer]  

## 2017-12-14 ENCOUNTER — Encounter: Payer: Self-pay | Admitting: Family Medicine

## 2017-12-14 LAB — COMPLETE METABOLIC PANEL WITH GFR
AG Ratio: 1.6 (calc) (ref 1.0–2.5)
ALKALINE PHOSPHATASE (APISO): 75 U/L (ref 33–115)
ALT: 12 U/L (ref 6–29)
AST: 14 U/L (ref 10–35)
Albumin: 4.1 g/dL (ref 3.6–5.1)
BUN: 12 mg/dL (ref 7–25)
CO2: 28 mmol/L (ref 20–32)
CREATININE: 0.57 mg/dL (ref 0.50–1.10)
Calcium: 9 mg/dL (ref 8.6–10.2)
Chloride: 104 mmol/L (ref 98–110)
GFR, EST NON AFRICAN AMERICAN: 110 mL/min/{1.73_m2} (ref >=60)
GFR, Est African American: 127 mL/min/{1.73_m2} (ref >=60)
GLOBULIN: 2.6 g/dL (ref 1.9–3.7)
Glucose, Bld: 96 mg/dL (ref 65–99)
Potassium: 4.1 mmol/L (ref 3.5–5.3)
SODIUM: 140 mmol/L (ref 135–146)
Total Bilirubin: 0.3 mg/dL (ref 0.2–1.2)
Total Protein: 6.7 g/dL (ref 6.1–8.1)

## 2017-12-14 LAB — HEMOGLOBIN A1C
EAG (MMOL/L): 7.1 (calc)
Hgb A1c MFr Bld: 6.1 % of total Hgb — ABNORMAL HIGH (ref <5.7)
Mean Plasma Glucose: 128 (calc)

## 2017-12-14 LAB — CBC WITH DIFFERENTIAL/PLATELET
BASOS PCT: 0.3 %
Basophils Absolute: 35 cells/uL (ref 0–200)
EOS ABS: 394 {cells}/uL (ref 15–500)
Eosinophils Relative: 3.4 %
HCT: 35.3 % (ref 35.0–45.0)
Hemoglobin: 11.5 g/dL — ABNORMAL LOW (ref 11.7–15.5)
Lymphs Abs: 3225 cells/uL (ref 850–3900)
MCH: 25.5 pg — AB (ref 27.0–33.0)
MCHC: 32.6 g/dL (ref 32.0–36.0)
MCV: 78.3 fL — AB (ref 80.0–100.0)
MPV: 9.2 fL (ref 7.5–12.5)
Monocytes Relative: 6.9 %
Neutro Abs: 7146 cells/uL (ref 1500–7800)
Neutrophils Relative %: 61.6 %
PLATELETS: 351 10*3/uL (ref 140–400)
RBC: 4.51 10*6/uL (ref 3.80–5.10)
RDW: 15.7 % — ABNORMAL HIGH (ref 11.0–15.0)
TOTAL LYMPHOCYTE: 27.8 %
WBC: 11.6 10*3/uL — ABNORMAL HIGH (ref 3.8–10.8)
WBCMIX: 800 {cells}/uL (ref 200–950)

## 2017-12-14 LAB — LIPID PANEL
CHOL/HDL RATIO: 3.7 (calc) (ref <5.0)
CHOLESTEROL: 186 mg/dL (ref <200)
HDL: 50 mg/dL — AB (ref >50)
LDL CHOLESTEROL (CALC): 118 mg/dL — AB
Non-HDL Cholesterol (Calc): 136 mg/dL (calc) — ABNORMAL HIGH (ref <130)
Triglycerides: 85 mg/dL (ref <150)

## 2017-12-14 LAB — IRON,TIBC AND FERRITIN PANEL
%SAT: 10 % (calc) — ABNORMAL LOW (ref 16–45)
Ferritin: 95 ng/mL (ref 16–232)
IRON: 40 ug/dL (ref 40–190)
TIBC: 382 mcg/dL (calc) (ref 250–450)

## 2017-12-16 ENCOUNTER — Other Ambulatory Visit: Payer: Self-pay | Admitting: Family Medicine

## 2017-12-16 DIAGNOSIS — D72829 Elevated white blood cell count, unspecified: Secondary | ICD-10-CM

## 2017-12-16 DIAGNOSIS — D649 Anemia, unspecified: Secondary | ICD-10-CM

## 2018-01-02 NOTE — Progress Notes (Deleted)
[facility]  Clinic day:  01/02/2018  Chief Complaint: Kathleen Conley is a 47 y.o. female with leukocytosis who is referred in consultation by Dr. Alba Cory for assessment and management.  HPI: ***  Patient has had leukocytosis documented on all CBCs dating back to 10/05/2011 except for 05/14/2014.  WBC has ranged between 11,300 - 18,800.  Differential has been predominantly neutrophils.  Hemoglobin has been normal.  MCV has varied between 75 - 83.5.  Last 2 CBCs with diff: 11/29/2016:  Hematocrit 36.7, hemoglobin 12.0, MCV 81.6, platelets 399,000, WBC 11,300 with an ANC of 6995.  Differential included 62% segs, 6% monocytes, 3% eosinophils, 1% basophils. 12/13/2017:  Hematocrit 35.3, hemoglobin 11.5, MCV 78.3, platelets 351,000, WBC 11,600 with an ANC of 7146.  Differential included 62% segs, 7% monocytes, 3% eosinophils.  Ferritin has been followed: 108 on 11/26/2014, 113 on 06/18/2015, and 95 on 12/13/2017.  Iron studies on 12/13/2017 included a saturation of 10% (low) and a TIBC of 382.  B12 was 590 on 05/31/2016.  CMP on 12/13/2017 revealed a creatinine 0.57 and normal LFTs.    Past Medical History:  Diagnosis Date  . 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 Allergy   . Anemia    Iron Deficiency  . Arthritis   . Astigmatism 08/21/2017   Dr. Marcellus Scott  . Baker cyst   . Breast mass, right 01/18/2010  . Cardiac murmur   . Chronic constipation   . Depression   . Dysmetabolic syndrome   . Fibromyalgia   . Hairiness   . Hydradenitis   . Hypertension   . Incarcerated ventral hernia 09/29/2011  . 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 Marcellus Scott  . Myopia 08/21/2017   Dr. Marcellus Scott - [facility]  . Obesity   . Obstructive sleep apnea    no CPAP  . Open-angle glaucoma of both eyes, mild stage 08/21/2017   Dr. Marcellus Scott - [facility]  . Plantar fasciitis   .  Presbyopia 07/02/219   Dr. Marcellus Scott  . PVC (premature ventricular contraction)     Past Surgical History:  Procedure Laterality Date  . FOOT ARTHRODESIS Right 12/01/2016   Procedure: ARTHRODESIS FOOT; TRIPLE;  Surgeon: [name], DPM;  Location: [facility] ORS;  Service: Podiatry;  Laterality: Right;  . HERNIA REPAIR  10/09/11   Ventral Incarcerated- Dr. [name]  . OOPHORECTOMY Left 12/09/2009  . TUBAL LIGATION  1993  . VENTRAL HERNIA REPAIR  10/09/2011    Family History  Problem Relation Age of Onset  . Hypertension Mother   . CVA Mother   . Kidney disease Mother   . Congenital heart disease Mother   . Heart disease Father   . Hypertension Father   . Diabetes Father   . Breast cancer Paternal Grandmother        Bilateral  . Colon cancer Maternal Grandfather   . Colon cancer Maternal Uncle     Social History:  reports that she has never smoked. She has never used smokeless tobacco. She reports that she does not drink alcohol or use drugs.  The patient is accompanied by *** alone today.  Allergies:  Allergies  Allergen Reactions  . Hctz [Hydrochlorothiazide] Cough  . Lisinopril Cough         Current Medications: Current Outpatient Medications  Medication Sig Dispense Refill  . acetaminophen (TYLENOL) 500 MG tablet Take 1 tablet (500 mg total) by  mouth every 6 (six) hours as needed. Max of 3 grams daily or 6 pills dialy (Patient taking differently: Take 1,000 mg by mouth every 6 (six) hours as needed for moderate pain or headache. ) 30 tablet 0  . Armodafinil (NUVIGIL) 150 MG tablet Take 1 tablet (150 mg total) by mouth daily. 30 tablet 2  . aspirin EC 81 MG tablet Take 1 tablet (81 mg total) by mouth daily. 30 tablet 0  . Cetirizine HCl (ZYRTEC ALLERGY) 10 MG CAPS Take 1 capsule by mouth as needed.    . Cholecalciferol (VITAMIN D) 2000 UNITS tablet Take 2,000 Units by mouth daily.     Marland Kitchen. docusate sodium (COLACE) 100 MG capsule Take 100 mg by mouth daily as needed for  moderate constipation.     . DULoxetine (CYMBALTA) 60 MG capsule Take 1 capsule (60 mg total) by mouth daily. 90 capsule 0  . ferrous sulfate 325 (65 FE) MG tablet Take 325 mg by mouth daily.     . fluticasone (FLONASE) 50 MCG/ACT nasal spray Place 2 sprays into both nostrils daily as needed for allergies. 16 g 1  . ibuprofen (ADVIL,MOTRIN) 200 MG tablet Take 400 mg by mouth every 6 (six) hours as needed for headache or moderate pain.    Marland Kitchen. levonorgestrel (MIRENA, 52 MG,) 20 MCG/24HR IUD 1 each by Intrauterine route once.     . loratadine (CLARITIN) 10 MG tablet Take 1 tablet (10 mg total) by mouth daily. (Patient not taking: Reported on 10/10/2017) 30 tablet 11  . metaxalone (SKELAXIN) 800 MG tablet Take 1 tablet (800 mg total) by mouth 3 (three) times daily as needed for muscle spasms. 90 tablet 1  . Multiple Vitamins-Minerals (MULTIVITAMIN PO) Take 1 tablet by mouth daily.    Marland Kitchen. olmesartan (BENICAR) 40 MG tablet Take 1 tablet (40 mg total) by mouth daily. 90 tablet 1  . TRAVATAN Z 0.004 % SOLN ophthalmic solution Place 1 drop into both eyes at bedtime.  3  . traZODone (DESYREL) 50 MG tablet Take 0.5-1 tablets (25-50 mg total) by mouth at bedtime as needed for sleep. 30 tablet 2   No current facility-administered medications for this visit.     Review of Systems:  GENERAL:  Feels good.  Active.  No fevers, sweats or weight loss. PERFORMANCE STATUS (ECOG):  *** HEENT:  No visual changes, runny nose, sore throat, mouth sores or tenderness. Lungs: No shortness of breath or cough.  No hemoptysis. Cardiac:  No chest pain, palpitations, orthopnea, or PND. GI:  No nausea, vomiting, diarrhea, constipation, melena or hematochezia. GU:  No urgency, frequency, dysuria, or hematuria. Musculoskeletal:  No back pain.  No joint pain.  No muscle tenderness. Extremities:  No pain or swelling. Skin:  No rashes or skin changes. Neuro:  No headache, numbness or weakness, balance or coordination  issues. Endocrine:  No diabetes, thyroid issues, hot flashes or night sweats. Psych:  No mood changes, depression or anxiety. Pain:  No focal pain. Review of systems:  All other systems reviewed and found to be negative.  Physical Exam: There were no vitals taken for this visit. GENERAL:  Well developed, well nourished, **man sitting comfortably in the exam room in no acute distress. MENTAL STATUS:  Alert and oriented to person, place and time. HEAD:  *** hair.  Normocephalic, atraumatic, face symmetric, no Cushingoid features. EYES:  *** eyes.  Pupils equal round and reactive to light and accomodation.  No conjunctivitis or scleral icterus. ENT:  Oropharynx clear  without lesion.  Tongue normal. Mucous membranes moist.  RESPIRATORY:  Clear to auscultation without rales, wheezes or rhonchi. CARDIOVASCULAR:  Regular rate and rhythm without murmur, rub or gallop. ABDOMEN:  Soft, non-tender, with active bowel sounds, and no hepatosplenomegaly.  No masses. SKIN:  No rashes, ulcers or lesions. EXTREMITIES: No edema, no skin discoloration or tenderness.  No palpable cords. LYMPH NODES: No palpable cervical, supraclavicular, axillary or inguinal adenopathy  NEUROLOGICAL: Unremarkable. PSYCH:  Appropriate.   No visits with results within 3 Day(s) from this visit.  Latest known visit with results is:  Office Visit on 12/13/2017  Component Date Value Ref Range Status  . Hgb A1c MFr Bld 12/13/2017 6.1* <5.7 % of total Hgb Final   Comment: For someone without known diabetes, a hemoglobin  A1c value between 5.7% and 6.4% is consistent with prediabetes and should be confirmed with a  follow-up test. . For someone with known diabetes, a value <7% indicates that their diabetes is well controlled. A1c targets should be individualized based on duration of diabetes, age, comorbid conditions, and other considerations. . This assay result is consistent with an increased risk of  diabetes. . Currently, no consensus exists regarding use of hemoglobin A1c for diagnosis of diabetes for children. .   . Mean Plasma Glucose 12/13/2017 128  (calc) Final  . eAG (mmol/L) 12/13/2017 7.1  (calc) Final  . Cholesterol 12/13/2017 186  <200 mg/dL Final  . HDL 78/46/9629 50* >50 mg/dL Final  . Triglycerides 12/13/2017 85  <150 mg/dL Final  . LDL Cholesterol (Calc) 12/13/2017 118* mg/dL (calc) Final   Comment: Reference range: <100 . Desirable range <100 mg/dL for primary prevention;   <70 mg/dL for patients with CHD or diabetic patients  with > or = 2 CHD risk factors. Marland Kitchen LDL-C is now calculated using the Martin-Hopkins  calculation, which is a validated novel method providing  better accuracy than the Friedewald equation in the  estimation of LDL-C.  Horald Pollen et al. Lenox Ahr. 5284;132(44): 2061-2068  (http://education.[employer].com/faq/FAQ164)   . Total CHOL/HDL Ratio 12/13/2017 3.7  <0.1 (calc) Final  . Non-HDL Cholesterol (Calc) 12/13/2017 136* <130 mg/dL (calc) Final   Comment: For patients with diabetes plus 1 major ASCVD risk  factor, treating to a non-HDL-C goal of <100 mg/dL  (LDL-C of <02 mg/dL) is considered a therapeutic  option.   . Glucose, Bld 12/13/2017 96  65 - 99 mg/dL Final   Comment: .            Fasting reference interval .   . BUN 12/13/2017 12  7 - 25 mg/dL Final  . Creat 72/53/6644 0.57  0.50 - 1.10 mg/dL Final  . GFR, Est Non African American 12/13/2017 110  > OR = 60 mL/min/1.54m2 Final  . GFR, Est African American 12/13/2017 127  > OR = 60 mL/min/1.8m2 Final  . BUN/Creatinine Ratio 12/13/2017 NOT APPLICABLE  6 - 22 (calc) Final  . Sodium 12/13/2017 140  135 - 146 mmol/L Final  . Potassium 12/13/2017 4.1  3.5 - 5.3 mmol/L Final  . Chloride 12/13/2017 104  98 - 110 mmol/L Final  . CO2 12/13/2017 28  20 - 32 mmol/L Final  . Calcium 12/13/2017 9.0  8.6 - 10.2 mg/dL Final  . Total Protein 12/13/2017 6.7  6.1 - 8.1 g/dL Final  . Albumin  03/47/4259 4.1  3.6 - 5.1 g/dL Final  . Globulin 56/38/7564 2.6  1.9 - 3.7 g/dL (calc) Final  . AG Ratio 12/13/2017 1.6  1.0 -  2.5 (calc) Final  . Total Bilirubin 12/13/2017 0.3  0.2 - 1.2 mg/dL Final  . Alkaline phosphatase (APISO) 12/13/2017 75  33 - 115 U/L Final  . AST 12/13/2017 14  10 - 35 U/L Final  . ALT 12/13/2017 12  6 - 29 U/L Final  . WBC 12/13/2017 11.6* 3.8 - 10.8 Thousand/uL Final  . RBC 12/13/2017 4.51  3.80 - 5.10 Million/uL Final  . Hemoglobin 12/13/2017 11.5* 11.7 - 15.5 g/dL Final  . HCT 96/05/5407 35.3  35.0 - 45.0 % Final  . MCV 12/13/2017 78.3* 80.0 - 100.0 fL Final  . MCH 12/13/2017 25.5* 27.0 - 33.0 pg Final  . MCHC 12/13/2017 32.6  32.0 - 36.0 g/dL Final  . RDW 81/19/1478 15.7* 11.0 - 15.0 % Final  . Platelets 12/13/2017 351  140 - 400 Thousand/uL Final  . MPV 12/13/2017 9.2  7.5 - 12.5 fL Final  . Neutro Abs 12/13/2017 7,146  1,500 - 7,800 cells/uL Final  . Lymphs Abs 12/13/2017 3,225  850 - 3,900 cells/uL Final  . WBC mixed population 12/13/2017 800  200 - 950 cells/uL Final  . Eosinophils Absolute 12/13/2017 394  15 - 500 cells/uL Final  . Basophils Absolute 12/13/2017 35  0 - 200 cells/uL Final  . Neutrophils Relative % 12/13/2017 61.6  % Final  . Total Lymphocyte 12/13/2017 27.8  % Final  . Monocytes Relative 12/13/2017 6.9  % Final  . Eosinophils Relative 12/13/2017 3.4  % Final  . Basophils Relative 12/13/2017 0.3  % Final  . Iron 12/13/2017 40  40 - 190 mcg/dL Final  . TIBC 29/56/2130 382  250 - 450 mcg/dL (calc) Final  . %SAT 86/57/8469 10* 16 - 45 % (calc) Final  . Ferritin 12/13/2017 95  16 - 232 ng/mL Final    Assessment:  Kathleen Conley is a 47 y.o. female with mild leukocytosis and microcytic RBC indices suggestive of iron deficiency.  WBC has ranged between 11,300 - 18,800.  Differential has been predominantly neutrophils.  Hemoglobin has been normal.  MCV has varied between 75 - 83.5. Platelet count has been normal.  Ferritin has been  followed: 108 on 11/26/2014, 113 on 06/18/2015, and 95 on 12/13/2017.  Iron studies on 12/13/2017 included a saturation of 10% (low) and a TIBC of 382.   Plan: 1.   Labs today:  CBC with diff, sed rate, CRP, 2.   Peripheral smear for path review. 3.   Leukocytosis:  Discuss primary versus secondary causes of leukocytosis  Primary: myeloproliferative disorders, hereditary.  Secondary: infection, inflammation, steroids, cigarette smoking, malignancy, hyposplenism, exercise. 4.  RTC in 1 week for MD assessment, review of work-up, and discussion regarding direction of therapy.   Rosey Bath, MD   01/02/2018, 4:25 PM   I saw and evaluated the patient, participating in the key portions of the service and reviewing pertinent diagnostic studies and records.  I reviewed the nurse practitioner's note and agree with the findings and the plan.  The assessment and plan were discussed with the patient.  Additional diagnostic studies of *** are needed to clarify *** and would change the clinical management.  A few ***multiple questions were asked by the patient and answered.   Nelva Nay, MD 01/02/2018,4:25 PM

## 2018-01-03 ENCOUNTER — Inpatient Hospital Stay: Payer: 59 | Admitting: Hematology and Oncology

## 2018-01-11 ENCOUNTER — Encounter: Payer: Self-pay | Admitting: Family Medicine

## 2018-01-11 ENCOUNTER — Ambulatory Visit: Payer: 59 | Admitting: Family Medicine

## 2018-01-11 VITALS — BP 148/96 | HR 72 | Temp 98.0°F | Resp 16 | Ht 61.0 in | Wt 242.1 lb

## 2018-01-11 DIAGNOSIS — D72829 Elevated white blood cell count, unspecified: Secondary | ICD-10-CM | POA: Diagnosis not present

## 2018-01-11 DIAGNOSIS — F330 Major depressive disorder, recurrent, mild: Secondary | ICD-10-CM

## 2018-01-11 DIAGNOSIS — E785 Hyperlipidemia, unspecified: Secondary | ICD-10-CM

## 2018-01-11 DIAGNOSIS — E8881 Metabolic syndrome: Secondary | ICD-10-CM

## 2018-01-11 DIAGNOSIS — G4726 Circadian rhythm sleep disorder, shift work type: Secondary | ICD-10-CM | POA: Diagnosis not present

## 2018-01-11 DIAGNOSIS — I10 Essential (primary) hypertension: Secondary | ICD-10-CM

## 2018-01-11 DIAGNOSIS — M797 Fibromyalgia: Secondary | ICD-10-CM | POA: Diagnosis not present

## 2018-01-11 DIAGNOSIS — D649 Anemia, unspecified: Secondary | ICD-10-CM

## 2018-01-11 DIAGNOSIS — G4733 Obstructive sleep apnea (adult) (pediatric): Secondary | ICD-10-CM | POA: Diagnosis not present

## 2018-01-11 MED ORDER — DULOXETINE HCL 60 MG PO CPEP: 60.0000 mg | ORAL_CAPSULE | Freq: Every day | ORAL | 0 refills | Status: DC

## 2018-01-11 MED ORDER — BUPROPION HCL ER (XL) 150 MG PO TB24: 150.0000 mg | ORAL_TABLET | Freq: Every day | ORAL | 0 refills | Status: DC

## 2018-01-11 MED ORDER — AMLODIPINE BESYLATE 2.5 MG PO TABS: 2.5000 mg | ORAL_TABLET | Freq: Every day | ORAL | 0 refills | Status: DC

## 2018-01-11 MED ORDER — ARMODAFINIL 150 MG PO TABS: 150.0000 mg | ORAL_TABLET | Freq: Every day | ORAL | 2 refills | Status: DC

## 2018-01-11 NOTE — Progress Notes (Signed)
Name: Kathleen Conley   MRN: [id number]    DOB: 1970/05/18   Date:01/11/2018       Progress Note  Subjective  Chief Complaint  Chief Complaint  Patient presents with  . Medication Refill    3 month F/U  . Depression    Would like to go back on Wellbutrin during the winter months.   . Hypertension    Denies any symptoms  . Fibromyalgia  . Sleep Apnea  . Insomnia    Sleep all day and even took Nuvigil    HPI  Major depression: she is doing well, but worried about seasonal affective disorder, usually resumes Wellbutrin this time of the year, life changes with husband moving in from [place] and has a 613 yo step daughter  Metabolic Syndrome and Obesity: we tried giving her Trulicity but needs step therapy, she failed Metformin. She denies polyphagia, polyuria or polydipsia. Reviewed labs with patient   Obesity: she has a long history of obesity, started after her divorce when she was in her 3330's. She tried weight watchers but was unsuccessful. She is a stress eater, she is now working 2nd shift instead of 3rd, but goes home and watches TV until late and sleeps in the am. Husband moved in last week and she is doing better with her routine, he likes to exercise and he is trying to motivate her to be active and stretch  FMS: she states pain is better with Cymbalta, could not tolerate Lyrica ( caused nightmares and vivid dreams), pain level on Cymbalta is0/10, she takes skelaxin about once a day for leg cramps, discussed stretching   OSA: she has mild symptoms, not on CPAP, sleeping on her side, she is not waking up with headaches. She also has shift work sleep disorder and states nuvigil helps her stay awake. Unchanged  HTN: bp has been elevated since last visit, a little change in her life, got married back in 2016 but her husband just moved in from [place], we will add low dose norvasc and monitor, no chest pain or palpitation   Insomnia: she states she has not been taking medication  at this time, sleeping well at this time.   Leucocytosis and anemia: she has seen hematologist in the past and does not want to go back at this time. We will monitor for now  Patient Active Problem List   Diagnosis Date Noted  . Post-op pain 12/01/2016  . 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 Flat foot 01/28/2016  . Aortic sclerosis 10/14/2015  . Midline low back pain without sciatica 09/20/2015  . Leukocytosis 11/29/2014  . Osteoarthritis of left knee 11/20/2014  . History of ventral hernia repair 10/27/2014  . Allergic rhinitis 08/17/2014  . Axillary hidradenitis suppurativa 08/17/2014  . Baker cyst 08/17/2014  . Chronic constipation 08/17/2014  . 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 Engages in travel abroad 08/17/2014  . Fibromyalgia 08/17/2014  . Cardiac murmur 08/17/2014  . Anemia, iron deficiency 08/17/2014  . Excessive and frequent menstruation 08/17/2014  . Dysmetabolic syndrome 08/17/2014  . Plantar fasciitis 08/17/2014  . Beat, premature ventricular 08/17/2014  . 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 Obstructive apnea 12/02/2013  . Essential (primary) hypertension 12/09/2009  . Hypertrichosis 12/09/2009  . Extreme obesity 12/09/2009    Past Surgical History:  Procedure Laterality Date  . FOOT ARTHRODESIS Right 12/01/2016   Procedure: ARTHRODESIS FOOT; TRIPLE;  Surgeon: Gwyneth RevelsFowler, Justin, DPM;  Location: [facility] ORS;  Service: Podiatry;  Laterality: Right;  . HERNIA REPAIR  10/09/11   Ventral Incarcerated- Dr. [name]  . OOPHORECTOMY Left 12/09/2009  . TUBAL LIGATION  1993  . VENTRAL HERNIA REPAIR  10/09/2011    Family History  Problem Relation Age of Onset  . Hypertension Mother   . CVA Mother   . Kidney disease Mother   . Congenital heart disease Mother   . Heart disease Father   . Hypertension Father   . Diabetes Father   . Breast cancer Paternal Grandmother        Bilateral  . Colon  cancer Maternal Grandfather   . Colon cancer Maternal Uncle     Social History   Socioeconomic History  . Marital status: Married    Spouse name: Not on file  . Number of children: 3  . Years of education: Associates  . Highest education level: Associate degree: academic program  Occupational History  . Occupation: LPN at the [employer]  Social Needs  . Financial resource strain: Not hard at all  . Food insecurity:    Worry: Never true    Inability: Never true  . Transportation needs:    Medical: No    Non-medical: No  Tobacco Use  . Smoking status: Never Smoker  . Smokeless tobacco: Never Used  Substance and Sexual Activity  . Alcohol use: No    Alcohol/week: 0.0 standard drinks  . Drug use: No  . Sexual activity: Yes    Partners: Male    Birth control/protection: IUD  Lifestyle  . Physical activity:    Days per week: 0 days    Minutes per session: Not on file  . Stress: Not at all  Relationships  . Social connections:    Talks on phone: More than three times a week    Gets together: Never    Attends religious service: More than 4 times per year    Active member of club or organization: No    Attends meetings of clubs or organizations: Never    Relationship status: Never married  . Intimate partner violence:    Fear of current or ex partner: No    Emotionally abused: No    Physically abused: No    Forced sexual activity: No  Other Topics Concern  . Not on file  Social History Narrative   Married Moses in 2016 in [place] and he finally got visa to move to [place] Nov 2019     Current Outpatient Medications:  .  acetaminophen (TYLENOL) 500 MG tablet, Take 1 tablet (500 mg total) by mouth every 6 (six) hours as needed. Max of 3 grams daily or 6 pills dialy (Patient taking differently: Take 1,000 mg by mouth every 6 (six) hours as needed for moderate pain or headache. ), Disp: 30 tablet, Rfl: 0 .  Armodafinil (NUVIGIL) 150 MG tablet, Take 1 tablet (150 mg  total) by mouth daily., Disp: 30 tablet, Rfl: 2 .  aspirin EC 81 MG tablet, Take 1 tablet (81 mg total) by mouth daily., Disp: 30 tablet, Rfl: 0 .  Cetirizine HCl (ZYRTEC ALLERGY) 10 MG CAPS, Take 1 capsule by mouth as needed., Disp: , Rfl:  .  Cholecalciferol (VITAMIN D) 2000 UNITS tablet, Take 2,000 Units by mouth daily. , Disp: , Rfl:  .  docusate sodium (COLACE) 100 MG capsule, Take 100 mg by mouth daily as needed for moderate constipation. , Disp: , Rfl:  .  DULoxetine (CYMBALTA) 60 MG capsule, Take 1  capsule (60 mg total) by mouth daily., Disp: 90 capsule, Rfl: 0 .  ferrous sulfate 325 (65 FE) MG tablet, Take 325 mg by mouth daily. , Disp: , Rfl:  .  fluticasone (FLONASE) 50 MCG/ACT nasal spray, Place 2 sprays into both nostrils daily as needed for allergies., Disp: 16 g, Rfl: 1 .  ibuprofen (ADVIL,MOTRIN) 200 MG tablet, Take 400 mg by mouth every 6 (six) hours as needed for headache or moderate pain., Disp: , Rfl:  .  levonorgestrel (MIRENA, 52 MG,) 20 MCG/24HR IUD, 1 each by Intrauterine route once. , Disp: , Rfl:  .  metaxalone (SKELAXIN) 800 MG tablet, Take 1 tablet (800 mg total) by mouth 3 (three) times daily as needed for muscle spasms., Disp: 90 tablet, Rfl: 1 .  Multiple Vitamins-Minerals (MULTIVITAMIN PO), Take 1 tablet by mouth daily., Disp: , Rfl:  .  olmesartan (BENICAR) 40 MG tablet, Take 1 tablet (40 mg total) by mouth daily., Disp: 90 tablet, Rfl: 1 .  TRAVATAN Z 0.004 % SOLN ophthalmic solution, Place 1 drop into both eyes at bedtime., Disp: , Rfl: 3 .  traZODone (DESYREL) 50 MG tablet, Take 0.5-1 tablets (25-50 mg total) by mouth at bedtime as needed for sleep., Disp: 30 tablet, Rfl: 2 .  loratadine (CLARITIN) 10 MG tablet, Take 1 tablet (10 mg total) by mouth daily. (Patient not taking: Reported on 01/11/2018), Disp: 30 tablet, Rfl: 11  Allergies  Allergen Reactions  . Hctz [Hydrochlorothiazide] Cough  . Lisinopril Cough         I personally reviewed active problem  list, medication list, allergies, family history, social history with the patient/caregiver today.   ROS  Constitutional: Negative for fever or weight change.  Respiratory: Negative for cough and shortness of breath.   Cardiovascular: Negative for chest pain or palpitations.  Gastrointestinal: Negative for abdominal pain, no bowel changes.  Musculoskeletal: Negative for gait problem or joint swelling.  Skin: Negative for rash.  Neurological: Negative for dizziness or headache.  No other specific complaints in a complete review of systems (except as listed in HPI above).  Objective  Vitals:   01/11/18 0832  BP: (!) 148/96  Pulse: 72  Resp: 16  Temp: 98 F (36.7 C)  TempSrc: Oral  SpO2: 96%  Weight: 242 lb 1.6 oz (109.8 kg)  Height: 5\' 1"  (1.549 m)    Body mass index is 45.74 kg/m.  Physical Exam  Constitutional: Patient appears well-developed and well-nourished. Obese  No distress.  HEENT: head atraumatic, normocephalic, pupils equal and reactive to light,  neck supple, throat within normal limits Cardiovascular: Normal rate, regular rhythm and normal heart sounds.  No murmur heard. No BLE edema. Pulmonary/Chest: Effort normal and breath sounds normal. No respiratory distress. Abdominal: Soft.  There is no tenderness. Psychiatric: Patient has a normal mood and affect. behavior is normal. Judgment and thought content normal.  Recent Results (from the past 2160 hour(s))  Hemoglobin A1c     Status: Abnormal   Collection Time: 12/13/17 11:44 AM  Result Value Ref Range   Hgb A1c MFr Bld 6.1 (H) <5.7 % of total Hgb    Comment: For someone without known diabetes, a hemoglobin  A1c value between 5.7% and 6.4% is consistent with prediabetes and should be confirmed with a  follow-up test. . For someone with known diabetes, a value <7% indicates that their diabetes is well controlled. A1c targets should be individualized based on duration of diabetes, age, comorbid  conditions, and other considerations. Marland Kitchen  This assay result is consistent with an increased risk of diabetes. . Currently, no consensus exists regarding use of hemoglobin A1c for diagnosis of diabetes for children. .    Mean Plasma Glucose 128 (calc)   eAG (mmol/L) 7.1 (calc)  Lipid panel     Status: Abnormal   Collection Time: 12/13/17 11:44 AM  Result Value Ref Range   Cholesterol 186 <200 mg/dL   HDL 50 (L) >16 mg/dL   Triglycerides 85 <109 mg/dL   LDL Cholesterol (Calc) 118 (H) mg/dL (calc)    Comment: Reference range: <100 . Desirable range <100 mg/dL for primary prevention;   <70 mg/dL for patients with CHD or diabetic patients  with > or = 2 CHD risk factors. Marland Kitchen LDL-C is now calculated using the Martin-Hopkins  calculation, which is a validated novel method providing  better accuracy than the Friedewald equation in the  estimation of LDL-C.  Horald Pollen et al. Lenox Ahr. 6045;409(81): 2061-2068  (http://education.[facility].com/faq/FAQ164)    Total CHOL/HDL Ratio 3.7 <5.0 (calc)   Non-HDL Cholesterol (Calc) 136 (H) <130 mg/dL (calc)    Comment: For patients with diabetes plus 1 major ASCVD risk  factor, treating to a non-HDL-C goal of <100 mg/dL  (LDL-C of <19 mg/dL) is considered a therapeutic  option.   COMPLETE METABOLIC PANEL WITH GFR     Status: None   Collection Time: 12/13/17 11:44 AM  Result Value Ref Range   Glucose, Bld 96 65 - 99 mg/dL    Comment: .            Fasting reference interval .    BUN 12 7 - 25 mg/dL   Creat 1.47 8.29 - 5.62 mg/dL   GFR, Est Non African American 110 > OR = 60 mL/min/1.13m2   GFR, Est African American 127 > OR = 60 mL/min/1.59m2   BUN/Creatinine Ratio NOT APPLICABLE 6 - 22 (calc)   Sodium 140 135 - 146 mmol/L   Potassium 4.1 3.5 - 5.3 mmol/L   Chloride 104 98 - 110 mmol/L   CO2 28 20 - 32 mmol/L   Calcium 9.0 8.6 - 10.2 mg/dL   Total Protein 6.7 6.1 - 8.1 g/dL   Albumin 4.1 3.6 - 5.1 g/dL   Globulin 2.6 1.9 - 3.7 g/dL  (calc)   AG Ratio 1.6 1.0 - 2.5 (calc)   Total Bilirubin 0.3 0.2 - 1.2 mg/dL   Alkaline phosphatase (APISO) 75 33 - 115 U/L   AST 14 10 - 35 U/L   ALT 12 6 - 29 U/L  CBC with Differential/Platelet     Status: Abnormal   Collection Time: 12/13/17 11:44 AM  Result Value Ref Range   WBC 11.6 (H) 3.8 - 10.8 Thousand/uL   RBC 4.51 3.80 - 5.10 Million/uL   Hemoglobin 11.5 (L) 11.7 - 15.5 g/dL   HCT 13.0 86.5 - 78.4 %   MCV 78.3 (L) 80.0 - 100.0 fL   MCH 25.5 (L) 27.0 - 33.0 pg   MCHC 32.6 32.0 - 36.0 g/dL   RDW 69.6 (H) 29.5 - 28.4 %   Platelets 351 140 - 400 Thousand/uL   MPV 9.2 7.5 - 12.5 fL   Neutro Abs 7,146 1,500 - 7,800 cells/uL   Lymphs Abs 3,225 850 - 3,900 cells/uL   WBC mixed population 800 200 - 950 cells/uL   Eosinophils Absolute 394 15 - 500 cells/uL   Basophils Absolute 35 0 - 200 cells/uL   Neutrophils Relative % 61.6 %   Total Lymphocyte  27.8 %   Monocytes Relative 6.9 %   Eosinophils Relative 3.4 %   Basophils Relative 0.3 %  Iron, TIBC and Ferritin Panel     Status: Abnormal   Collection Time: 12/13/17 11:44 AM  Result Value Ref Range   Iron 40 40 - 190 mcg/dL   TIBC 409 811 - 914 mcg/dL (calc)   %SAT 10 (L) 16 - 45 % (calc)   Ferritin 95 16 - 232 ng/mL      PHQ2/9: Depression screen [facility] 2/9 01/11/2018 12/13/2017 10/10/2017 04/05/2017 02/23/2017  Decreased Interest 1 0 0 3 0  Down, Depressed, Hopeless 1 1 0 3 0  PHQ - 2 Score 2 1 0 6 0  Altered sleeping 1 2 0 3 -  Tired, decreased energy 1 0 1 3 -  Change in appetite 1 1 0 2 -  Feeling bad or failure about yourself  1 0 0 3 -  Trouble concentrating 1 0 0 3 -  Moving slowly or fidgety/restless 0 0 0 0 -  Suicidal thoughts 0 0 0 0 -  PHQ-9 Score 7 4 1 20  -  Difficult doing work/chores Not difficult at all Not difficult at all Not difficult at all Very difficult -     Fall Risk: Fall Risk  01/11/2018 12/13/2017 10/10/2017 02/23/2017 11/28/2016  Falls in the past year? 0 No No No No  Number falls in past  yr: 0 - - - -  Injury with Fall? 0 - - - -      Assessment & Plan  1. Obstructive apnea  - Armodafinil (NUVIGIL) 150 MG tablet; Take 1 tablet (150 mg total) by mouth daily.  Dispense: 30 tablet; Refill: 2  2. Shift work sleep disorder  - Armodafinil (NUVIGIL) 150 MG tablet; Take 1 tablet (150 mg total) by mouth daily.  Dispense: 30 tablet; Refill: 2  3. Depression, major, recurrent, mild (HCC)  - DULoxetine (CYMBALTA) 60 MG capsule; Take 1 capsule (60 mg total) by mouth daily.  Dispense: 90 capsule; Refill: 0 - buPROPion (WELLBUTRIN XL) 150 MG 24 hr tablet; Take 1 tablet (150 mg total) by mouth daily.  Dispense: 90 tablet; Refill: 0  4. Fibromyalgia  - DULoxetine (CYMBALTA) 60 MG capsule; Take 1 capsule (60 mg total) by mouth daily.  Dispense: 90 capsule; Refill: 0  5. Essential (primary) hypertension  - amLODipine (NORVASC) 2.5 MG tablet; Take 1 tablet (2.5 mg total) by mouth daily.  Dispense: 90 tablet; Refill: 0  6. Leukocytosis, unspecified type  She wants to hold off on going back to see hematologist   7. Dyslipidemia   8. Dysmetabolic syndrome  Last hgbA1C went up to 6.1%  9. Anemia, unspecified type

## 2018-02-14 ENCOUNTER — Ambulatory Visit (INDEPENDENT_AMBULATORY_CARE_PROVIDER_SITE_OTHER): Payer: Self-pay | Admitting: Physician Assistant

## 2018-02-14 VITALS — BP 132/100 | HR 79 | Temp 98.3°F | Resp 12 | Ht 62.0 in | Wt 238.0 lb

## 2018-02-14 DIAGNOSIS — R300 Dysuria: Secondary | ICD-10-CM

## 2018-02-14 DIAGNOSIS — N3001 Acute cystitis with hematuria: Secondary | ICD-10-CM

## 2018-02-14 LAB — POCT URINALYSIS DIPSTICK
BILIRUBIN UA: NEGATIVE
Blood, UA: POSITIVE
GLUCOSE UA: NEGATIVE
Ketones, UA: NEGATIVE
Nitrite, UA: NEGATIVE
Protein, UA: POSITIVE — AB
SPEC GRAV UA: 1.015 (ref 1.010–1.025)
Urobilinogen, UA: 0.2 E.U./dL
pH, UA: 6 (ref 5.0–8.0)

## 2018-02-14 LAB — POCT URINE PREGNANCY: PREG TEST UR: NEGATIVE

## 2018-02-14 MED ORDER — SULFAMETHOXAZOLE-TRIMETHOPRIM 800-160 MG PO TABS: 1.0000 | ORAL_TABLET | Freq: Two times a day (BID) | ORAL | 0 refills | Status: AC

## 2018-02-14 MED ORDER — PHENAZOPYRIDINE HCL 200 MG PO TABS: 200.0000 mg | ORAL_TABLET | Freq: Three times a day (TID) | ORAL | 0 refills | Status: DC | PRN

## 2018-02-14 NOTE — Patient Instructions (Signed)
Thank you for choosing [facility] for your health care needs today.  You have been diagnosed with a urinary tract infection (UTI).  Recommend increase fluids; water and/or cranberry juice. May take over the counter Tylenol or ibuprofen for pain/discomfort.  You have been prescribed antibiotic, Bactrim. Take 1 tablet by mouth twice a day x 5 days. You have also been prescribed Pyridium, will help with symptoms. Will turn urine an orange color.  Follow-up with family physician, urgent care, or gynecologist in 2-3 days if symptoms not improving. At that time, a urine culture would be warranted.  Follow-up sooner if you develop fever, worsening back pain, nausea/vomiting, abdominal pain, or other new/concerning symptom.  Urinary Tract Infection, Adult A urinary tract infection (UTI) is an infection of any part of the urinary tract. The urinary tract includes:  The kidneys.  The ureters.  The bladder.  The urethra. These organs make, store, and get rid of pee (urine) in the body. What are the causes? This is caused by germs (bacteria) in your genital area. These germs grow and cause swelling (inflammation) of your urinary tract. What increases the risk? You are more likely to develop this condition if:  You have a small, thin tube (catheter) to drain pee.  You cannot control when you pee or poop (incontinence).  You are female, and: ? You use these methods to prevent pregnancy: ? A medicine that kills sperm (spermicide). ? A device that blocks sperm (diaphragm). ? You have low levels of a female hormone (estrogen). ? You are pregnant.  You have genes that add to your risk.  You are sexually active.  You take antibiotic medicines.  You have trouble peeing because of: ? A prostate that is bigger than normal, if you are female. ? A blockage in the part of your body that drains pee from the bladder (urethra). ? A kidney stone. ? A nerve condition that affects your bladder  (neurogenic bladder). ? Not getting enough to drink. ? Not peeing often enough.  You have other conditions, such as: ? Diabetes. ? A weak disease-fighting system (immune system). ? Sickle cell disease. ? Gout. ? Injury of the spine. What are the signs or symptoms? Symptoms of this condition include:  Needing to pee right away (urgently).  Peeing often.  Peeing small amounts often.  Pain or burning when peeing.  Blood in the pee.  Pee that smells bad or not like normal.  Trouble peeing.  Pee that is cloudy.  Fluid coming from the vagina, if you are female.  Pain in the belly or lower back. Other symptoms include:  Throwing up (vomiting).  No urge to eat.  Feeling mixed up (confused).  Being tired and grouchy (irritable).  A fever.  Watery poop (diarrhea). How is this treated? This condition may be treated with:  Antibiotic medicine.  Other medicines.  Drinking enough water. Follow these instructions at home:  Medicines  Take over-the-counter and prescription medicines only as told by your doctor.  If you were prescribed an antibiotic medicine, take it as told by your doctor. Do not stop taking it even if you start to feel better. General instructions  Make sure you: ? Pee until your bladder is empty. ? Do not hold pee for a long time. ? Empty your bladder after sex. ? Wipe from front to back after pooping if you are a female. Use each tissue one time when you wipe.  Drink enough fluid to keep your pee pale yellow.  Keep all follow-up visits as told by your doctor. This is important. Contact a doctor if:  You do not get better after 1-2 days.  Your symptoms go away and then come back. Get help right away if:  You have very bad back pain.  You have very bad pain in your lower belly.  You have a fever.  You are sick to your stomach (nauseous).  You are throwing up. Summary  A urinary tract infection (UTI) is an infection of any  part of the urinary tract.  This condition is caused by germs in your genital area.  There are many risk factors for a UTI. These include having a small, thin tube to drain pee and not being able to control when you pee or poop.  Treatment includes antibiotic medicines for germs.  Drink enough fluid to keep your pee pale yellow. This information is not intended to replace advice given to you by your health care provider. Make sure you discuss any questions you have with your health care provider. Document Released: 07/26/2007 Document Revised: 08/16/2017 Document Reviewed: 08/16/2017 Elsevier Interactive Patient Education  2019 [employer]

## 2018-02-14 NOTE — Progress Notes (Signed)
Patient ID: Kathleen MariaElizabeth Conley DOB: [birth year] AGE: 47 y.o. MRN: [id number]   PCP: Alba CorySowles, Krichna, MD   Chief Complaint:  Chief Complaint  Patient presents with  . Urinary Tract Infection    x2d     Subjective:    HPI:  Kathleen Conley is a 47 y.o. female presents for evaluation  Chief Complaint  Patient presents with  . Urinary Tract Infection    x232d    47 year old female [facility] to [facility] with 3 day history of UTI symptoms. Reports increased urinary urgency (has had few episodes of urge incontinence - not per her normal), dysuria (at end of urinary stream), increased urinary frequency, nocturia (not per her normal), and gross hematuria. Has developed associated bilateral low back and flank pain. Mild in severity. Has taken OTC Tylenol and ibuprofen with minimal relief. Denies fever, chills, headache, nausea/vomiting, abdominal pain/discomfort, vaginal rash/discharge/pruritis, diarrhea. Patient with diagnosis of stress incontinence; currently treating with at-home strengthening exercises. Patient in monogamous relationship with husband; he returned home from [place] one month ago. Denies concern for STD. Patient with IUD. Patient with diagnosis of pre-diabetes; last A1C 6.1 on 12/13/2017. Not on any diabetic medication. Patient reports previous history of UTI; current symptoms feel similar. Has not had a UTI for years. No previous history of ureteral stone/nephrolithiasis and no previous history of pyelonephritis. Patient last seen by gynecologist, Dr. Alba [facility]Krichna [facility] on 12/13/2017.  A limited review of symptoms was performed, pertinent positives and negatives as mentioned in HPI.  The following portions of the patient's history were reviewed and updated as appropriate: allergies, current medications and past medical history.  Patient Active Problem List   Diagnosis Date Noted  . Post-op pain 12/01/2016  . Sinus tarsi syndrome  of right foot 01/28/2016  . Flat foot 01/28/2016  . Aortic sclerosis 10/14/2015  . Midline low back pain without sciatica 09/20/2015  . Leukocytosis 11/29/2014  . Osteoarthritis of left knee 11/20/2014  . History of ventral hernia repair 10/27/2014  . Allergic rhinitis 08/17/2014  . Axillary hidradenitis suppurativa 08/17/2014  . Baker cyst 08/17/2014  . Chronic constipation 08/17/2014  . 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 Engages in travel abroad 08/17/2014  . Fibromyalgia 08/17/2014  . Cardiac murmur 08/17/2014  . Anemia, iron deficiency 08/17/2014  . Excessive and frequent menstruation 08/17/2014  . Dysmetabolic syndrome 08/17/2014  . Plantar fasciitis 08/17/2014  . Beat, premature ventricular 08/17/2014  . 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 Obstructive apnea 12/02/2013  . Essential (primary) hypertension 12/09/2009  . Hypertrichosis 12/09/2009  . Extreme obesity 12/09/2009    Allergies  Allergen Reactions  . Hctz [Hydrochlorothiazide] Cough  . Lisinopril Cough         Current Outpatient Medications on File Prior to Visit  Medication Sig Dispense Refill  . acetaminophen (TYLENOL) 500 MG tablet Take 1 tablet (500 mg total) by mouth every 6 (six) hours as needed. Max of 3 grams daily or 6 pills dialy (Patient taking differently: Take 1,000 mg by mouth every 6 (six) hours as needed for moderate pain or headache. ) 30 tablet 0  . amLODipine (NORVASC) 2.5 MG tablet Take 1 tablet (2.5 mg total) by mouth daily. 90 tablet 0  . Armodafinil (NUVIGIL) 150 MG tablet Take 1 tablet (150 mg total) by mouth daily. 30 tablet 2  . aspirin EC 81 MG tablet Take 1 tablet (81 mg total) by mouth daily. 30 tablet 0  .  buPROPion (WELLBUTRIN XL) 150 MG 24 hr tablet Take 1 tablet (150 mg total) by mouth daily. 90 tablet 0  . Cetirizine HCl (ZYRTEC ALLERGY) 10 MG CAPS Take 1 capsule by mouth as needed.    . Cholecalciferol  (VITAMIN D) 2000 UNITS tablet Take 2,000 Units by mouth daily.     Marland Kitchen. docusate sodium (COLACE) 100 MG capsule Take 100 mg by mouth daily as needed for moderate constipation.     . DULoxetine (CYMBALTA) 60 MG capsule Take 1 capsule (60 mg total) by mouth daily. 90 capsule 0  . ferrous sulfate 325 (65 FE) MG tablet Take 325 mg by mouth daily.     . fluticasone (FLONASE) 50 MCG/ACT nasal spray Place 2 sprays into both nostrils daily as needed for allergies. 16 g 1  . ibuprofen (ADVIL,MOTRIN) 200 MG tablet Take 400 mg by mouth every 6 (six) hours as needed for headache or moderate pain.    Marland Kitchen. levonorgestrel (MIRENA, 52 MG,) 20 MCG/24HR IUD 1 each by Intrauterine route once.     . metaxalone (SKELAXIN) 800 MG tablet Take 1 tablet (800 mg total) by mouth 3 (three) times daily as needed for muscle spasms. 90 tablet 1  . Multiple Vitamins-Minerals (MULTIVITAMIN PO) Take 1 tablet by mouth daily.    Marland Kitchen. olmesartan (BENICAR) 40 MG tablet Take 1 tablet (40 mg total) by mouth daily. 90 tablet 1  . TRAVATAN Z 0.004 % SOLN ophthalmic solution Place 1 drop into both eyes at bedtime.  3  . traZODone (DESYREL) 50 MG tablet Take 0.5-1 tablets (25-50 mg total) by mouth at bedtime as needed for sleep. 30 tablet 2   No current facility-administered medications on file prior to visit.        Objective:   Vitals:   02/14/18 1513  BP: (!) 132/100  Pulse: 79  Resp: 12  Temp: 98.3 F (36.8 C)  SpO2: 96%     Wt Readings from Last 3 Encounters:  02/14/18 238 lb (108 kg)  01/11/18 242 lb 1.6 oz (109.8 kg)  12/13/17 240 lb 14.4 oz (109.3 kg)    Physical Exam:   General Appearance:  Patient sitting comfortably on examination table. Conversational. Peri JeffersonGood self-historian. In no acute distress. Afebrile.   Head:  Normocephalic, without obvious abnormality, atraumatic  Eyes:  PERRL, conjunctiva/corneas clear, EOM's intact  Lungs:   Clear to auscultation bilaterally, respirations unlabored  Heart:  Regular rate and  rhythm. 3/6 systolic heart murmur (known to patient)  Abdomen:   Soft, non-tender, bowel sounds active all four quadrants,  no masses, no organomegaly. Mild bilateral CVA tenderness; right side worse than left.  Extremities: Extremities normal, atraumatic, no cyanosis or edema  Pulses: 2+ and symmetric  Skin: Skin color, texture, turgor normal, no rashes or lesions  Lymph nodes: Cervical, supraclavicular, and axillary nodes normal  Neurologic: Normal    Assessment & Plan:    Exam findings, diagnosis etiology and medication use and indications reviewed with patient. Follow-Up and discharge instructions provided. No emergent/urgent issues found on exam.  Patient education was provided.   Patient verbalized understanding of information provided and agrees with plan of care (POC), all questions answered. The patient is advised to call or return to clinic if condition does not see an improvement in symptoms, or to seek the care of the closest emergency department if condition worsens with the below plan.    UA performed in office today revealed: Blood 250 Bilirubin Neg Uro Norm Ketones Neg Protein 100mg /dL Nitrites  Neg Glucose Neg P.H 6.0 S.G 1.015 Leukocytes 75 WBC/uL Color Lt. Yellow Clarity Clear  Urine pregnancy test in office today: Negative   1. Acute cystitis with hematuria  - sulfamethoxazole-trimethoprim (BACTRIM DS) 800-160 MG tablet; Take 1 tablet by mouth 2 (two) times daily for 5 days.  Dispense: 10 tablet; Refill: 0 - phenazopyridine (PYRIDIUM) 200 MG tablet; Take 1 tablet (200 mg total) by mouth 3 (three) times daily as needed for pain.  Dispense: 10 tablet; Refill: 0  2. Dysuria  - POCT Urinalysis Dipstick - POCT urine pregnancy  Patient with three day history of UTI symptoms; dysuria, urinary urgency, gross hematuria. Leukocytes and blood present on UA. Diagnosed with UTI. Mild bilateral CVA tenderness; however short duration of symptoms, afebrile, no  associated nausea/vomiting, no associated suprapubic abdominal pain/discomfort, no history of frequent/recurrent UTI, no frequent antibiotic usage, no DM2 history, no genitourinary comorbid condition (low concern for pyelonephritis). Patient works as a Engineer, civil (consulting) in assisted living; requested Bactrim as her antibiotic of choice (Bactrim warranted for an uncomplicated UTI). Prescribed Pyridium for symptom relief. Advised patient follow-up with urgent care, PCP, or gynecologist in 2-3 days if symptoms not improving; repeat UA with C&S warranted at that time. Patient agrees with plan.   Janalyn Harder, MHS, PA-C Rulon Sera, MHS, PA-C Advanced Practice Provider [facility]  [address], [facility], 1st Floor [place], [place] [address] (p):  [phone] [email or web address]

## 2018-02-19 ENCOUNTER — Telehealth: Payer: Self-pay | Admitting: Emergency Medicine

## 2018-02-19 NOTE — Telephone Encounter (Signed)
Spoke to patient whom informed me that she is doing so much better.

## 2018-02-28 ENCOUNTER — Ambulatory Visit (INDEPENDENT_AMBULATORY_CARE_PROVIDER_SITE_OTHER): Payer: Self-pay | Admitting: Physician Assistant

## 2018-02-28 ENCOUNTER — Encounter: Payer: Self-pay | Admitting: Physician Assistant

## 2018-02-28 VITALS — BP 158/90 | HR 85 | Temp 98.2°F | Resp 20 | Wt 241.0 lb

## 2018-02-28 DIAGNOSIS — R300 Dysuria: Secondary | ICD-10-CM

## 2018-02-28 LAB — POCT URINALYSIS DIPSTICK
BILIRUBIN UA: NEGATIVE
Blood, UA: 5
Glucose, UA: NEGATIVE
Ketones, UA: NEGATIVE
Leukocytes, UA: NEGATIVE
Nitrite, UA: NEGATIVE
PH UA: 5 (ref 5.0–8.0)
Protein, UA: POSITIVE — AB
Spec Grav, UA: 1.02 (ref 1.010–1.025)
Urobilinogen, UA: 0.2 E.U./dL

## 2018-02-28 LAB — POCT URINE PREGNANCY: Preg Test, Ur: NEGATIVE
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
## 2018-02-28 NOTE — Patient Instructions (Signed)
Thank you for choosing [facility] for your health care needs.  You have been diagnosed with dysuria (painful urination).  You were recently treated for a UTI (urinary tract infection). Due to continuation of symptoms, believe you need urine culture (collection of urine, will then test to see which bacteria grows, will also test for which antibiotic will be most effective).  Recommend you go to your family physician's office, urgent care, or ED for further evaluation and treatment.  Dysuria Dysuria is pain or discomfort while urinating. The pain or discomfort may be felt in the part of your body that drains urine from the bladder (urethra) or in the surrounding tissue of the genitals. The pain may also be felt in the groin area, lower abdomen, or lower back. You may have to urinate frequently or have the sudden feeling that you have to urinate (urgency). Dysuria can affect both men and women, but it is more common in women. Dysuria can be caused by many different things, including:  Urinary tract infection.  Kidney stones or bladder stones.  Certain sexually transmitted infections (STIs), such as chlamydia.  Dehydration.  Inflammation of the tissues of the vagina.  Use of certain medicines.  Use of certain soaps or scented products that cause irritation. Follow these instructions at home: General instructions  Watch your condition for any changes.  Urinate often. Avoid holding urine for long periods of time.  After a bowel movement or urination, women should cleanse from front to back, using each tissue only once.  Urinate after sexual intercourse.  Keep all follow-up visits as told by your health care provider. This is important.  If you had any tests done to find the cause of dysuria, it is up to you to get your test results. Ask your health care provider, or the department that is doing the test, when your results will be ready. Eating and drinking   Drink enough fluid to  keep your urine pale yellow.  Avoid caffeine, tea, and alcohol. They can irritate the bladder and make dysuria worse. In men, alcohol may irritate the prostate. Medicines  Take over-the-counter and prescription medicines only as told by your health care provider.  If you were prescribed an antibiotic medicine, take it as told by your health care provider. Do not stop taking the antibiotic even if you start to feel better. Contact a health care provider if:  You have a fever.  You develop pain in your back or sides.  You have nausea or vomiting.  You have blood in your urine.  You are not urinating as often as you usually do. Get help right away if:  Your pain is severe and not relieved with medicines.  You cannot eat or drink without vomiting.  You are confused.  You have a rapid heartbeat while at rest.  You have shaking or chills.  You feel extremely weak. Summary  Dysuria is pain or discomfort while urinating. Many different conditions can lead to dysuria.  If you have dysuria, you may have to urinate frequently or have the sudden feeling that you have to urinate (urgency).  Watch your condition for any changes. Keep all follow-up visits as told by your health care provider.  Make sure that you urinate often and drink enough fluid to keep your urine pale yellow. This information is not intended to replace advice given to you by your health care provider. Make sure you discuss any questions you have with your health care provider. Document Released: 11/05/2003  Document Revised: 11/23/2016 Document Reviewed: 11/23/2016 Elsevier Interactive Patient Education  [employer]2019 Elsevier Inc.

## 2018-03-01 ENCOUNTER — Ambulatory Visit: Payer: Self-pay | Admitting: *Deleted

## 2018-03-01 NOTE — Telephone Encounter (Signed)
Patient called.  Patient aware.  

## 2018-03-01 NOTE — Telephone Encounter (Signed)
Pt Called reporting BP elevations today 165/99 HR 88 via home wrist device. She states she has stopped her Norvasc because it made her feet and ankles swell. She say it made her feel bad and tired. Yesterdays BP taken at [facility] 158/90. She states that her swelling in her feet and ankles has resolve after stopping the Norvasc. She has ocasional headaches. Appointment scheduled per protocol. Care advice read to patient.  Pt verbalized understanding of all instructions.Pt will keep a BP diary and bring it to her visit. Reason for Disposition . [1] Systolic BP  >= 130 OR Diastolic >= 80 AND [2] taking BP medications  Answer Assessment - Initial Assessment Questions 1. BLOOD PRESSURE: "What is the blood pressure?" "Did you take at least two measurements 5 minutes apart?"     158/90 yesterday  168/117 per wrist device 165/99 HR88 2. ONSET: "When did you take your blood pressure?"     yesterday 3. HOW: "How did you obtain the blood pressure?" (e.g., visiting nurse, automatic home BP monitor)   Home wrist device 4. HISTORY: "Do you have a history of high blood pressure?"    yes 5. MEDICATIONS: "Are you taking any medications for blood pressure?" "Have you missed any doses recently?"    Yes stopped Norvasc because of feet and ankle swelling and she just didn't feel well in it.  6. OTHER SYMPTOMS: "Do you have any symptoms?" (e.g., headache, chest pain, blurred vision, difficulty breathing, weakness)     Headaches sometimes 2 per week. 7. PREGNANCY: "Is there any chance you are pregnant?" "When was your last menstrual period?"     No myrana  Protocols used: HIGH BLOOD PRESSURE-A-AH

## 2018-03-01 NOTE — Telephone Encounter (Signed)
She needs to come in to discuss options for bp control

## 2018-03-01 NOTE — Telephone Encounter (Signed)
Attempted to contact patient. Left VM to return call.  Message from Abbi R Burchel sent at 03/01/2018 9:17 AM EST   Summary: Medication Questions/Adverse Reaction   Pt states Dr Carlynn Purl recently prescribed amLODipine (NORVASC) 2.5 MG tablet in addition to her olmesartan (BENICAR) 40 MG tablet but she has stopped taking the Norvasc bc she states she was experiencing some swelling in her feet and ankles that made it painful to walk, and it made her feel tired and drained. Pt states she feels much better after stopping the Norvasc, but her BP is still elevated Pt states her BP was 158/90 yesterday. She has an appt scheduled for 04/17/2018, but needs to know if she should come in before then due to uncontrolled BP readings/med changes. Please call pt to advise.

## 2018-03-01 NOTE — Telephone Encounter (Signed)
Summary: Medication Questions/Adverse Reaction   Pt states Dr Carlynn Purl recently prescribed amLODipine (NORVASC) 2.5 MG tablet in addition to her olmesartan (BENICAR) 40 MG tablet but she has stopped taking the Norvasc bc she states she was experiencing some swelling in her feet and ankles that made it painful to walk, and it made her feel tired and drained. Pt states she feels much better after stopping the Norvasc, but her BP is still elevated Pt states her BP was 158/90 yesterday. She has an appt scheduled for 04/17/2018, but needs to know if she should come in before then due to uncontrolled BP readings/med changes. Please call pt to advise.   [phone]

## 2018-03-13 DIAGNOSIS — M25572 Pain in left ankle and joints of left foot: Secondary | ICD-10-CM | POA: Diagnosis not present

## 2018-03-13 DIAGNOSIS — M76829 Posterior tibial tendinitis, unspecified leg: Secondary | ICD-10-CM | POA: Diagnosis not present

## 2018-03-13 DIAGNOSIS — M216X2 Other acquired deformities of left foot: Secondary | ICD-10-CM | POA: Diagnosis not present

## 2018-03-14 ENCOUNTER — Encounter: Payer: Self-pay | Admitting: Family Medicine

## 2018-03-14 ENCOUNTER — Ambulatory Visit: Payer: 59 | Admitting: Family Medicine

## 2018-03-14 ENCOUNTER — Other Ambulatory Visit (HOSPITAL_COMMUNITY)
Admission: RE | Admit: 2018-03-14 | Discharge: 2018-03-14 | Disposition: A | Discharge status: Home / Self Care | Payer: 59 | Source: Ambulatory Visit | Attending: Family Medicine | Admitting: Family Medicine

## 2018-03-14 VITALS — BP 172/92 | HR 86 | Temp 98.3°F | Resp 16 | Ht 62.0 in | Wt 238.7 lb

## 2018-03-14 DIAGNOSIS — Z113 Encounter for screening for infections with a predominantly sexual mode of transmission: Secondary | ICD-10-CM | POA: Insufficient documentation

## 2018-03-14 DIAGNOSIS — F411 Generalized anxiety disorder: Secondary | ICD-10-CM | POA: Diagnosis not present

## 2018-03-14 DIAGNOSIS — Z87440 Personal history of urinary (tract) infections: Secondary | ICD-10-CM | POA: Diagnosis not present

## 2018-03-14 DIAGNOSIS — Z23 Encounter for immunization: Secondary | ICD-10-CM

## 2018-03-14 DIAGNOSIS — I10 Essential (primary) hypertension: Secondary | ICD-10-CM

## 2018-03-14 DIAGNOSIS — Z1239 Encounter for other screening for malignant neoplasm of breast: Secondary | ICD-10-CM | POA: Diagnosis not present

## 2018-03-14 MED ORDER — TRIAMTERENE-HCTZ 37.5-25 MG PO TABS: 1.0000 | ORAL_TABLET | Freq: Every day | ORAL | 3 refills | Status: DC

## 2018-03-14 MED ORDER — HYDROXYZINE HCL 10 MG PO TABS: 10.0000 mg | ORAL_TABLET | Freq: Three times a day (TID) | ORAL | 0 refills | Status: DC | PRN

## 2018-03-14 NOTE — Patient Instructions (Signed)
DASH Eating Plan  DASH stands for "Dietary Approaches to Stop Hypertension." The DASH eating plan is a healthy eating plan that has been shown to reduce high blood pressure (hypertension). It may also reduce your risk for type 2 diabetes, heart disease, and stroke. The DASH eating plan may also help with weight loss.  What are tips for following this plan?    General guidelines   Avoid eating more than 2,300 mg (milligrams) of salt (sodium) a day. If you have hypertension, you may need to reduce your sodium intake to 1,500 mg a day.   Limit alcohol intake to no more than 1 drink a day for nonpregnant women and 2 drinks a day for men. One drink equals 12 oz of beer, 5 oz of wine, or 1 oz of hard liquor.   Work with your health care provider to maintain a healthy body weight or to lose weight. Ask what an ideal weight is for you.   Get at least 30 minutes of exercise that causes your heart to beat faster (aerobic exercise) most days of the week. Activities may include walking, swimming, or biking.   Work with your health care provider or diet and nutrition specialist (dietitian) to adjust your eating plan to your individual calorie needs.  Reading food labels     Check food labels for the amount of sodium per serving. Choose foods with less than 5 percent of the Daily Value of sodium. Generally, foods with less than 300 mg of sodium per serving fit into this eating plan.   To find whole grains, look for the word "whole" as the first word in the ingredient list.  Shopping   Buy products labeled as "low-sodium" or "no salt added."   Buy fresh foods. Avoid canned foods and premade or frozen meals.  Cooking   Avoid adding salt when cooking. Use salt-free seasonings or herbs instead of table salt or sea salt. Check with your health care provider or pharmacist before using salt substitutes.   Do not fry foods. Cook foods using healthy methods such as baking, boiling, grilling, and broiling instead.   Cook with  heart-healthy oils, such as olive, canola, soybean, or sunflower oil.  Meal planning   Eat a balanced diet that includes:  ? 5 or more servings of fruits and vegetables each day. At each meal, try to fill half of your plate with fruits and vegetables.  ? Up to 6-8 servings of whole grains each day.  ? Less than 6 oz of lean meat, poultry, or fish each day. A 3-oz serving of meat is about the same size as a deck of cards. One egg equals 1 oz.  ? 2 servings of low-fat dairy each day.  ? A serving of nuts, seeds, or beans 5 times each week.  ? Heart-healthy fats. Healthy fats called Omega-3 fatty acids are found in foods such as flaxseeds and coldwater fish, like sardines, salmon, and mackerel.   Limit how much you eat of the following:  ? Canned or prepackaged foods.  ? Food that is high in trans fat, such as fried foods.  ? Food that is high in saturated fat, such as fatty meat.  ? Sweets, desserts, sugary drinks, and other foods with added sugar.  ? Full-fat dairy products.   Do not salt foods before eating.   Try to eat at least 2 vegetarian meals each week.   Eat more home-cooked food and less restaurant, buffet, and fast food.     When eating at a restaurant, ask that your food be prepared with less salt or no salt, if possible.  What foods are recommended?  The items listed may not be a complete list. Talk with your dietitian about what dietary choices are best for you.  Grains  Whole-grain or whole-wheat bread. Whole-grain or whole-wheat pasta. Brown rice. Oatmeal. Quinoa. Bulgur. Whole-grain and low-sodium cereals. Pita bread. Low-fat, low-sodium crackers. Whole-wheat flour tortillas.  Vegetables  Fresh or frozen vegetables (raw, steamed, roasted, or grilled). Low-sodium or reduced-sodium tomato and vegetable juice. Low-sodium or reduced-sodium tomato sauce and tomato paste. Low-sodium or reduced-sodium canned vegetables.  Fruits  All fresh, dried, or frozen fruit. Canned fruit in natural juice (without  added sugar).  Meat and other protein foods  Skinless chicken or turkey. Ground chicken or turkey. Pork with fat trimmed off. Fish and seafood. Egg whites. Dried beans, peas, or lentils. Unsalted nuts, nut butters, and seeds. Unsalted canned beans. Lean cuts of beef with fat trimmed off. Low-sodium, lean deli meat.  Dairy  Low-fat (1%) or fat-free (skim) milk. Fat-free, low-fat, or reduced-fat cheeses. Nonfat, low-sodium ricotta or cottage cheese. Low-fat or nonfat yogurt. Low-fat, low-sodium cheese.  Fats and oils  Soft margarine without trans fats. Vegetable oil. Low-fat, reduced-fat, or light mayonnaise and salad dressings (reduced-sodium). Canola, safflower, olive, soybean, and sunflower oils. Avocado.  Seasoning and other foods  Herbs. Spices. Seasoning mixes without salt. Unsalted popcorn and pretzels. Fat-free sweets.  What foods are not recommended?  The items listed may not be a complete list. Talk with your dietitian about what dietary choices are best for you.  Grains  Baked goods made with fat, such as croissants, muffins, or some breads. Dry pasta or rice meal packs.  Vegetables  Creamed or fried vegetables. Vegetables in a cheese sauce. Regular canned vegetables (not low-sodium or reduced-sodium). Regular canned tomato sauce and paste (not low-sodium or reduced-sodium). Regular tomato and vegetable juice (not low-sodium or reduced-sodium). Pickles. Olives.  Fruits  Canned fruit in a light or heavy syrup. Fried fruit. Fruit in cream or butter sauce.  Meat and other protein foods  Fatty cuts of meat. Ribs. Fried meat. Bacon. Sausage. Bologna and other processed lunch meats. Salami. Fatback. Hotdogs. Bratwurst. Salted nuts and seeds. Canned beans with added salt. Canned or smoked fish. Whole eggs or egg yolks. Chicken or turkey with skin.  Dairy  Whole or 2% milk, cream, and half-and-half. Whole or full-fat cream cheese. Whole-fat or sweetened yogurt. Full-fat cheese. Nondairy creamers. Whipped toppings.  Processed cheese and cheese spreads.  Fats and oils  Butter. Stick margarine. Lard. Shortening. Ghee. Bacon fat. Tropical oils, such as coconut, palm kernel, or palm oil.  Seasoning and other foods  Salted popcorn and pretzels. Onion salt, garlic salt, seasoned salt, table salt, and sea salt. Worcestershire sauce. Tartar sauce. Barbecue sauce. Teriyaki sauce. Soy sauce, including reduced-sodium. Steak sauce. Canned and packaged gravies. Fish sauce. Oyster sauce. Cocktail sauce. Horseradish that you find on the shelf. Ketchup. Mustard. Meat flavorings and tenderizers. Bouillon cubes. Hot sauce and Tabasco sauce. Premade or packaged marinades. Premade or packaged taco seasonings. Relishes. Regular salad dressings.  Where to find more information:   National Heart, Lung, and Blood Institute: www.nhlbi.nih.gov   American Heart Association: www.heart.org  Summary   The DASH eating plan is a healthy eating plan that has been shown to reduce high blood pressure (hypertension). It may also reduce your risk for type 2 diabetes, heart disease, and stroke.   With the   DASH eating plan, you should limit salt (sodium) intake to 2,300 mg a day. If you have hypertension, you may need to reduce your sodium intake to 1,500 mg a day.   When on the DASH eating plan, aim to eat more fresh fruits and vegetables, whole grains, lean proteins, low-fat dairy, and heart-healthy fats.   Work with your health care provider or diet and nutrition specialist (dietitian) to adjust your eating plan to your individual calorie needs.  This information is not intended to replace advice given to you by your health care provider. Make sure you discuss any questions you have with your health care provider.  Document Released: 01/26/2011 Document Revised: 01/31/2016 Document Reviewed: 01/31/2016  Elsevier Interactive Patient Education  2019 [employer] Inc[employer]
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
## 2018-03-15 LAB — MICROALBUMIN / CREATININE URINE RATIO
Creatinine, Urine: 197 mg/dL (ref 20–275)
Microalb Creat Ratio: 117 mcg/mg creat — ABNORMAL HIGH (ref <30)
Microalb, Ur: 23.1 mg/dL

## 2018-03-15 LAB — URINE CYTOLOGY ANCILLARY ONLY
Chlamydia: NEGATIVE
Neisseria Gonorrhea: NEGATIVE

## 2018-03-15 LAB — URINE CULTURE
MICRO NUMBER:: 95200
SPECIMEN QUALITY: ADEQUATE

## 2018-03-18 ENCOUNTER — Telehealth: Payer: Self-pay | Admitting: Obstetrics and Gynecology

## 2018-03-18 NOTE — Telephone Encounter (Signed)
2/4 at 950 with ams for mirena

## 2018-03-19 ENCOUNTER — Ambulatory Visit: Payer: Self-pay | Admitting: *Deleted

## 2018-03-19 ENCOUNTER — Emergency Department
Admission: EM | Admit: 2018-03-19 | Discharge: 2018-03-19 | Disposition: A | Discharge status: Home / Self Care | Payer: 59 | Attending: Emergency Medicine | Admitting: Emergency Medicine

## 2018-03-19 ENCOUNTER — Ambulatory Visit (INDEPENDENT_AMBULATORY_CARE_PROVIDER_SITE_OTHER): Payer: 59

## 2018-03-19 ENCOUNTER — Encounter: Payer: Self-pay | Admitting: Emergency Medicine

## 2018-03-19 VITALS — BP 130/76

## 2018-03-19 DIAGNOSIS — I10 Essential (primary) hypertension: Secondary | ICD-10-CM | POA: Insufficient documentation

## 2018-03-19 DIAGNOSIS — Z79899 Other long term (current) drug therapy: Secondary | ICD-10-CM | POA: Insufficient documentation

## 2018-03-19 DIAGNOSIS — Z7982 Long term (current) use of aspirin: Secondary | ICD-10-CM | POA: Insufficient documentation

## 2018-03-19 DIAGNOSIS — R42 Dizziness and giddiness: Secondary | ICD-10-CM | POA: Diagnosis not present

## 2018-03-19 DIAGNOSIS — Z0130 Encounter for examination of blood pressure without abnormal findings: Secondary | ICD-10-CM

## 2018-03-19 LAB — URINALYSIS, COMPLETE (UACMP) WITH MICROSCOPIC
Bilirubin Urine: NEGATIVE
Glucose, UA: NEGATIVE mg/dL
Ketones, ur: NEGATIVE mg/dL
Leukocytes, UA: NEGATIVE
Nitrite: NEGATIVE
PH: 5 (ref 5.0–8.0)
Protein, ur: NEGATIVE mg/dL
Specific Gravity, Urine: 1.025 (ref 1.005–1.030)

## 2018-03-19 LAB — BASIC METABOLIC PANEL
ANION GAP: 7 (ref 5–15)
BUN: 24 mg/dL — ABNORMAL HIGH (ref 6–20)
CO2: 27 mmol/L (ref 22–32)
Calcium: 9.4 mg/dL (ref 8.9–10.3)
Chloride: 104 mmol/L (ref 98–111)
Creatinine, Ser: 0.66 mg/dL (ref 0.44–1.00)
GFR calc non Af Amer: >60 mL/min (ref >60)
Glucose, Bld: 115 mg/dL — ABNORMAL HIGH (ref 70–99)
POTASSIUM: 3.8 mmol/L (ref 3.5–5.1)
Sodium: 138 mmol/L (ref 135–145)

## 2018-03-19 LAB — CBC
HCT: 43.6 % (ref 36.0–46.0)
Hemoglobin: 13.3 g/dL (ref 12.0–15.0)
MCH: 25.7 pg — ABNORMAL LOW (ref 26.0–34.0)
MCHC: 30.5 g/dL (ref 30.0–36.0)
MCV: 84.3 fL (ref 80.0–100.0)
Platelets: 380 10*3/uL (ref 150–400)
RBC: 5.17 MIL/uL — ABNORMAL HIGH (ref 3.87–5.11)
RDW: 17.5 % — ABNORMAL HIGH (ref 11.5–15.5)
WBC: 14.1 10*3/uL — ABNORMAL HIGH (ref 4.0–10.5)
nRBC: 0 % (ref 0.0–0.2)

## 2018-03-19 LAB — POCT PREGNANCY, URINE: Preg Test, Ur: NEGATIVE

## 2018-03-19 LAB — GLUCOSE, CAPILLARY: Glucose-Capillary: 117 mg/dL — ABNORMAL HIGH (ref 70–99)

## 2018-03-19 NOTE — ED Provider Notes (Signed)
[facility] Emergency Department Provider Note   ____________________________________________    I have reviewed the triage vital signs and the nursing notes.   HISTORY  Chief Complaint Dizziness     HPI Kathleen Conley is a 48 y.o. female who presents with complaints of dizziness, lightheadedness, sweating.  Patient reports she recently started blood pressure medication Maxide on Friday.  She states she "did not feel right on Saturday or Sunday".  On Monday she went to work and was able to complete her shift but felt dizzy and occasionally sweaty throughout her shift.  No chest pain palpitations or shortness of breath.  Some nausea no vomiting.  No abdominal pain  Past Medical History:  Diagnosis Date  . 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 Allergy   . Anemia    Iron Deficiency  . Arthritis   . Astigmatism 08/21/2017   Dr. Marcellus ScottErin Jackson  . Baker cyst   . Breast mass, right 01/18/2010  . Cardiac murmur   . Chronic constipation   . Depression   . Dysmetabolic syndrome   . Fibromyalgia   . Hairiness   . Hydradenitis   . Hypertension   . Incarcerated ventral hernia 09/29/2011  . 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 Marcellus ScottErin Jackson  . Myopia 08/21/2017   Dr. Marcellus ScottErin Jackson - [facility]  . Obesity   . Obstructive sleep apnea    no CPAP  . Open-angle glaucoma of both eyes, mild stage 08/21/2017   Dr. Marcellus ScottErin Jackson - [facility]  . Plantar fasciitis   . Presbyopia 07/02/219   Dr. Marcellus ScottErin Jackson  . PVC (premature ventricular contraction)     Patient Active Problem List   Diagnosis Date Noted  . Post-op pain 12/01/2016  . 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 Flat foot 01/28/2016  . Aortic sclerosis 10/14/2015  . Midline low back pain without sciatica 09/20/2015  . Leukocytosis 11/29/2014  . Osteoarthritis of left knee 11/20/2014  . History of ventral hernia  repair 10/27/2014  . Allergic rhinitis 08/17/2014  . Axillary hidradenitis suppurativa 08/17/2014  . Baker cyst 08/17/2014  . Chronic constipation 08/17/2014  . 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 Engages in travel abroad 08/17/2014  . Fibromyalgia 08/17/2014  . Cardiac murmur 08/17/2014  . Anemia, iron deficiency 08/17/2014  . Excessive and frequent menstruation 08/17/2014  . Dysmetabolic syndrome 08/17/2014  . Plantar fasciitis 08/17/2014  . Beat, premature ventricular 08/17/2014  . 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 Obstructive apnea 12/02/2013  . Essential (primary) hypertension 12/09/2009  . Hypertrichosis 12/09/2009  . Extreme obesity 12/09/2009    Past Surgical History:  Procedure Laterality Date  . FOOT ARTHRODESIS Right 12/01/2016   Procedure: ARTHRODESIS FOOT; TRIPLE;  Surgeon: Gwyneth RevelsFowler, Justin, DPM;  Location: [facility] ORS;  Service: Podiatry;  Laterality: Right;  . HERNIA REPAIR  10/09/11   Ventral Incarcerated- [name]  . OOPHORECTOMY Left 12/09/2009  . TUBAL LIGATION  1993  . VENTRAL HERNIA REPAIR  10/09/2011    Prior to Admission medications   Medication Sig Start Date End Date Taking? Authorizing Provider  acetaminophen (TYLENOL) 500 MG tablet Take 1 tablet (500 mg total) by mouth every 6 (six) hours as needed. Max of 3 grams daily or 6 pills dialy Patient taking differently: Take 1,000 mg by mouth every 6 (six) hours as needed for moderate pain or headache.  06/18/15  Alba Cory, MD  Armodafinil (NUVIGIL) 150 MG tablet Take 1 tablet (150 mg total) by mouth daily. 01/11/18   Alba Cory, MD  aspirin EC 81 MG tablet Take 1 tablet (81 mg total) by mouth daily. 01/28/16   Alba Cory, MD  buPROPion (WELLBUTRIN XL) 150 MG 24 hr tablet Take 1 tablet (150 mg total) by mouth daily. 01/11/18   Alba Cory, MD  Cetirizine HCl (ZYRTEC ALLERGY) 10 MG CAPS Take 1 capsule by mouth as needed.     [provider]  Cholecalciferol (VITAMIN D) 2000 UNITS tablet Take 2,000 Units by mouth daily.     [provider]  docusate sodium (COLACE) 100 MG capsule Take 100 mg by mouth daily as needed for moderate constipation.     [provider]  DULoxetine (CYMBALTA) 60 MG capsule Take 1 capsule (60 mg total) by mouth daily. 01/11/18   Alba Cory, MD  ferrous sulfate 325 (65 FE) MG tablet Take 325 mg by mouth daily.     [provider]  fluticasone (FLONASE) 50 MCG/ACT nasal spray Place 2 sprays into both nostrils daily as needed for allergies. 09/06/17   Alba Cory, MD  hydrOXYzine (ATARAX/VISTARIL) 10 MG tablet Take 1 tablet (10 mg total) by mouth 3 (three) times daily as needed. 03/14/18   Alba Cory, MD  ibuprofen (ADVIL,MOTRIN) 200 MG tablet Take 400 mg by mouth every 6 (six) hours as needed for headache or moderate pain.    [provider]  levonorgestrel (MIRENA, 52 MG,) 20 MCG/24HR IUD 1 each by Intrauterine route once.     [provider]  metaxalone (SKELAXIN) 800 MG tablet Take 1 tablet (800 mg total) by mouth 3 (three) times daily as needed for muscle spasms. 10/10/17   Alba Cory, MD  Multiple Vitamins-Minerals (MULTIVITAMIN PO) Take 1 tablet by mouth daily.    [provider]  olmesartan (BENICAR) 40 MG tablet Take 1 tablet (40 mg total) by mouth daily. 10/10/17   Alba Cory, MD  TRAVATAN Z 0.004 % SOLN ophthalmic solution Place 1 drop into both eyes at bedtime. 08/21/17   Pa, [facility]  traZODone (DESYREL) 50 MG tablet Take 0.5-1 tablets (25-50 mg total) by mouth at bedtime as needed for sleep. 10/10/17   Alba Cory, MD     Allergies Lisinopril and Norvasc [amlodipine besylate]  Family History  Problem Relation Age of Onset  . Hypertension Mother   . CVA Mother   . Kidney disease Mother   . Congenital heart disease Mother   . Heart disease Father   . Hypertension Father   .  Diabetes Father   . Breast cancer Paternal Grandmother        Bilateral  . Colon cancer Maternal Grandfather   . Colon cancer Maternal Uncle     Social History Social History   Tobacco Use  . Smoking status: Never Smoker  . Smokeless tobacco: Never Used  Substance Use Topics  . Alcohol use: No    Alcohol/week: 0.0 standard drinks  . Drug use: No    Review of Systems  Constitutional: No fever/chills Eyes: No visual changes.  ENT: No sore throat. Cardiovascular: As above Respiratory: Denies shortness of breath. Gastrointestinal: As above Genitourinary: Negative for dysuria. Musculoskeletal: Negative for back pain. Skin: Negative for rash. Neurological: Negative for headaches    ____________________________________________   PHYSICAL EXAM:  VITAL SIGNS: ED Triage Vitals  Enc Vitals Group     BP 03/19/18 1055 (!) 139/97  Pulse Rate 03/19/18 1055 90     Resp 03/19/18 1055 18     Temp 03/19/18 1055 98.4 F (36.9 C)     Temp Source 03/19/18 1055 Oral     SpO2 03/19/18 1055 99 %     Weight 03/19/18 1056 108 kg (238 lb)     Height 03/19/18 1056 1.575 m (5\' 2" )     Head Circumference --      Peak Flow --      Pain Score 03/19/18 1056 0     Pain Loc --      Pain Edu? --      Excl. in GC? --     Constitutional: Alert and oriented. No acute distress.   Nose: No congestion/rhinnorhea. Mouth/Throat: Mucous membranes are moist.    Cardiovascular: Normal rate, regular rhythm. Grossly normal heart sounds.  Good peripheral circulation. Respiratory: Normal respiratory effort.  No retractions. Lungs CTAB. Gastrointestinal: Soft and nontender. No distention.    Musculoskeletal: No lower extremity tenderness nor edema.  Warm and well perfused Neurologic:  Normal speech and language. No gross focal neurologic deficits are appreciated.  Skin:  Skin is warm, dry and intact. No rash noted. Psychiatric: Mood and affect are normal. Speech and behavior are  normal.  ____________________________________________   LABS (all labs ordered are listed, but only abnormal results are displayed)  Labs Reviewed  BASIC METABOLIC PANEL - Abnormal; Notable for the following components:      Result Value   Glucose, Bld 115 (*)    BUN 24 (*)    All other components within normal limits  CBC - Abnormal; Notable for the following components:   WBC 14.1 (*)    RBC 5.17 (*)    MCH 25.7 (*)    RDW 17.5 (*)    All other components within normal limits  URINALYSIS, COMPLETE (UACMP) WITH MICROSCOPIC - Abnormal; Notable for the following components:   Color, Urine YELLOW (*)    APPearance CLEAR (*)    Hgb urine dipstick SMALL (*)    Bacteria, UA RARE (*)    All other components within normal limits  GLUCOSE, CAPILLARY - Abnormal; Notable for the following components:   Glucose-Capillary 117 (*)    All other components within normal limits  CBG MONITORING, ED  POC URINE PREG, ED  POCT PREGNANCY, URINE   ____________________________________________  EKG  ED ECG REPORT I, Jene Everyobert Skylen Danielsen, the attending physician, personally viewed and interpreted this ECG.  Date: 03/19/2018  Rhythm: normal sinus rhythm QRS Axis: normal Intervals: normal ST/T Wave abnormalities: normal Narrative Interpretation: no evidence of acute ischemia  ____________________________________________  RADIOLOGY  None ____________________________________________   PROCEDURES  Procedure(s) performed: No  Procedures   Critical Care performed: No ____________________________________________   INITIAL IMPRESSION / ASSESSMENT AND PLAN / ED COURSE  Pertinent labs & imaging results that were available during my care of the patient were reviewed by me and considered in my medical decision making (see chart for details).  Patient presents with dizziness and symptoms as above, given time course suspicious for medication side effects.  Lab work is overall unremarkable.   The patient reports history of chronically elevated white blood cell count.  Symptoms not consistent with ACS, reassuring EKG.  Recommend to the patient holding Maxide to see if her symptoms improved.  Counseled contacting her PCP for medication adjustment    ____________________________________________   FINAL CLINICAL IMPRESSION(S) / ED DIAGNOSES  Final diagnoses:  Dizziness  Note:  This document was prepared using Dragon voice recognition software and may include unintentional dictation errors.   Jene Every, MD 03/19/18 1212

## 2018-03-19 NOTE — Telephone Encounter (Signed)
Noted. Will order to arrive by apt date/time. 

## 2018-03-19 NOTE — Telephone Encounter (Signed)
Patient was evaluated at Nurse Visit and advised to go to ED for further evaluation.

## 2018-03-19 NOTE — ED Triage Notes (Signed)
Patient presents to the ED with feeling dizzy, light headed and sweating since yesterday.  Patient was started on a new medication on Friday (Maxide).  Patient states symptoms started yesterday while working 2nd shift.  Patient states she was able to complete her shift but did not feel well and continues to feel the same today.

## 2018-03-19 NOTE — Telephone Encounter (Signed)
Message from Tonita Phoenix sent at 03/19/2018 8:44 AM EST   Summary: New Medication Side Effects   Pt stated she started a new medication triamterene-hydrochlorothiazide (MAXZIDE-25) 37.5-25 MG tablet last week. She has started feeling really bad since being on the medication with bouts of dizziness swimming head and sweating. She is not sure if this is a side effect of the medication. Please advise. CB# [phone]

## 2018-03-19 NOTE — Discharge Instructions (Signed)
Please discontinue the Maxide because I suspect you are having side effects from this medication. Discuss with your PCP to determine a replacement medication

## 2018-03-19 NOTE — Telephone Encounter (Signed)
Pt returned call regarding side effects from her new b/p medication, Maxzide-25. Pt stated that she started this medication on Friday and started experiencing symptoms after that.  She is sweating, dizziness and just not feeling good. She checked her b/p yesterday evening and it was 138/93. Has not checked it this morning. Flow at [facility] notified. Call was transferred to the nurse at the office.  Reason for Disposition . Caller has URGENT medication question about med that PCP prescribed and triager unable to answer question  Answer Assessment - Initial Assessment Questions 1. SYMPTOMS: "Do you have any symptoms?"     Yes  2. SEVERITY: If symptoms are present, ask "Are they mild, moderate or severe?"     Moderate to severe  Protocols used: MEDICATION QUESTION CALL-A-AH

## 2018-03-19 NOTE — Progress Notes (Signed)
Patient is here for a blood pressure check. Patient denies chest pain, palpitations, shortness of breath or visual disturbances. She reports that she continues to feel dizzy, light headed and sweaty. She states that she just does not feel well.  At previous visit blood pressure was 172/92 with a heart rate of 86. Today during nurse visit first check blood pressure was 130 76. She does take blood pressure medications as prescribed. She denies any missed doses.  Spoke with PCP. She advised patient to proceed to ED for further evaluation.

## 2018-03-26 ENCOUNTER — Ambulatory Visit (INDEPENDENT_AMBULATORY_CARE_PROVIDER_SITE_OTHER): Payer: 59 | Admitting: Obstetrics and Gynecology

## 2018-03-26 ENCOUNTER — Encounter: Payer: Self-pay | Admitting: Obstetrics and Gynecology

## 2018-03-26 VITALS — BP 142/78 | HR 105 | Ht 62.0 in | Wt 239.0 lb

## 2018-03-26 DIAGNOSIS — Z30431 Encounter for routine checking of intrauterine contraceptive device: Secondary | ICD-10-CM | POA: Diagnosis not present

## 2018-03-26 DIAGNOSIS — N951 Menopausal and female climacteric states: Secondary | ICD-10-CM | POA: Diagnosis not present

## 2018-03-26 NOTE — Progress Notes (Signed)
Obstetrics & Gynecology Office Visit   Chief Complaint:  Chief Complaint  Patient presents with  . Follow-up    IUD removal reinsert    History of Present Illness: Patient is a 48 y.o. G3P3 presenting for contraception consult.  She is currently on Mirena IUD and desiring to replace Mirena with a new Mirena.  She has a past medical history significant for chronic hypertension.  She specifically denies a history of migraine with aura, history of DVT/PE and smoking.  Reported No LMP recorded. (Menstrual status: IUD)..    She reports no bleeding concerns with her current IUD.  No abdominal pain or cramping.  She reports some mild vasomotor symptoms but nothing to significant.   Review of Systems: Review of Systems  Constitutional: Negative.   Gastrointestinal: Negative.   Genitourinary: Negative.    Past Medical History:  Past Medical History:  Diagnosis Date  . 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 Allergy   . Anemia    Iron Deficiency  . Arthritis   . Astigmatism 08/21/2017   Dr. Marcellus Scott  . Baker cyst   . Breast mass, right 01/18/2010  . Cardiac murmur   . Chronic constipation   . Depression   . Dysmetabolic syndrome   . Fibromyalgia   . Hairiness   . Hydradenitis   . Hypertension   . Incarcerated ventral hernia 09/29/2011  . 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 Marcellus Scott  . Myopia 08/21/2017   Dr. Marcellus Scott - [facility]  . Obesity   . Obstructive sleep apnea    no CPAP  . Open-angle glaucoma of both eyes, mild stage 08/21/2017   Dr. Marcellus Scott - [facility]  . Plantar fasciitis   . Presbyopia 07/02/219   Dr. Marcellus Scott  . PVC (premature ventricular contraction)     Past Surgical History:  Past Surgical History:  Procedure Laterality Date  . FOOT ARTHRODESIS Right 12/01/2016   Procedure: ARTHRODESIS FOOT; TRIPLE;  Surgeon: [name], DPM;  Location: [facility] ORS;   Service: Podiatry;  Laterality: Right;  . HERNIA REPAIR  10/09/11   Ventral Incarcerated- Dr. [name]  . OOPHORECTOMY Left 12/09/2009  . TUBAL LIGATION  1993  . VENTRAL HERNIA REPAIR  10/09/2011    Gynecologic History: No LMP recorded. (Menstrual status: IUD).  Obstetric History: G3P3  Family History:  Family History  Problem Relation Age of Onset  . Hypertension Mother   . CVA Mother   . Kidney disease Mother   . Congenital heart disease Mother   . Heart disease Father   . Hypertension Father   . Diabetes Father   . Breast cancer Paternal Grandmother        Bilateral  . Colon cancer Maternal Grandfather   . Colon cancer Maternal Uncle     Social History:  Social History   Socioeconomic History  . Marital status: Married    Spouse name: Not on file  . Number of children: 3  . Years of education: Associates  . Highest education level: Associate degree: academic program  Occupational History  . Occupation: LPN at the [employer]  Social Needs  . Financial resource strain: Not hard at all  . Food insecurity:    Worry: Never true    Inability: Never true  . Transportation needs:    Medical: No    Non-medical: No  Tobacco Use  . Smoking status: Never Smoker  .  Smokeless tobacco: Never Used  Substance and Sexual Activity  . Alcohol use: No    Alcohol/week: 0.0 standard drinks  . Drug use: No  . Sexual activity: Yes    Partners: Male    Birth control/protection: I.U.D.  Lifestyle  . Physical activity:    Days per week: 0 days    Minutes per session: Not on file  . Stress: Not at all  Relationships  . Social connections:    Talks on phone: More than three times a week    Gets together: Never    Attends religious service: More than 4 times per year    Active member of club or organization: No    Attends meetings of clubs or organizations: Never    Relationship status: Never married  . Intimate partner violence:    Fear of current or ex partner: No     Emotionally abused: No    Physically abused: No    Forced sexual activity: No  Other Topics Concern  . Not on file  Social History Narrative   Married Moses in 2016 in [place] and he finally got visa to move to [place] Nov 2019    Allergies:  Allergies  Allergen Reactions  . Lisinopril Cough       . Norvasc [Amlodipine Besylate]     Medications: Prior to Admission medications   Medication Sig Start Date End Date Taking? Authorizing Provider  acetaminophen (TYLENOL) 500 MG tablet Take 1 tablet (500 mg total) by mouth every 6 (six) hours as needed. Max of 3 grams daily or 6 pills dialy Patient taking differently: Take 1,000 mg by mouth every 6 (six) hours as needed for moderate pain or headache.  06/18/15  Yes Sowles, Danna HeftyKrichna, MD  Armodafinil (NUVIGIL) 150 MG tablet Take 1 tablet (150 mg total) by mouth daily. 01/11/18  Yes Alba CorySowles, Krichna, MD  aspirin EC 81 MG tablet Take 1 tablet (81 mg total) by mouth daily. 01/28/16  Yes Sowles, Danna HeftyKrichna, MD  buPROPion (WELLBUTRIN XL) 150 MG 24 hr tablet Take 1 tablet (150 mg total) by mouth daily. 01/11/18  Yes Sowles, Danna HeftyKrichna, MD  Cetirizine HCl (ZYRTEC ALLERGY) 10 MG CAPS Take 1 capsule by mouth as needed.   Yes [provider]  Cholecalciferol (VITAMIN D) 2000 UNITS tablet Take 2,000 Units by mouth daily.    Yes [provider]  docusate sodium (COLACE) 100 MG capsule Take 100 mg by mouth daily as needed for moderate constipation.    Yes [provider]  DULoxetine (CYMBALTA) 60 MG capsule Take 1 capsule (60 mg total) by mouth daily. 01/11/18  Yes Sowles, Danna HeftyKrichna, MD  ferrous sulfate 325 (65 FE) MG tablet Take 325 mg by mouth daily.    Yes [provider]  fluticasone (FLONASE) 50 MCG/ACT nasal spray Place 2 sprays into both nostrils daily as needed for allergies. 09/06/17  Yes Sowles, Danna HeftyKrichna, MD  hydrOXYzine (ATARAX/VISTARIL) 10 MG tablet Take 1 tablet (10 mg total) by mouth 3 (three) times daily as needed. 03/14/18   Yes Sowles, Danna HeftyKrichna, MD  ibuprofen (ADVIL,MOTRIN) 200 MG tablet Take 400 mg by mouth every 6 (six) hours as needed for headache or moderate pain.   Yes [provider]  levonorgestrel (MIRENA, 52 MG,) 20 MCG/24HR IUD 1 each by Intrauterine route once.    Yes [provider]  metaxalone (SKELAXIN) 800 MG tablet Take 1 tablet (800 mg total) by mouth 3 (three) times daily as needed for muscle spasms. 10/10/17  Yes Sowles,  Danna Hefty, MD  Multiple Vitamins-Minerals (MULTIVITAMIN PO) Take 1 tablet by mouth daily.   Yes [provider]  olmesartan (BENICAR) 40 MG tablet Take 1 tablet (40 mg total) by mouth daily. 10/10/17  Yes Sowles, Danna Hefty, MD  TRAVATAN Z 0.004 % SOLN ophthalmic solution Place 1 drop into both eyes at bedtime. 08/21/17  Yes Pa, [facility]  traZODone (DESYREL) 50 MG tablet Take 0.5-1 tablets (25-50 mg total) by mouth at bedtime as needed for sleep. 10/10/17  Yes Alba Cory, MD    Physical Exam Vitals:  Vitals:   03/26/18 1009  BP: (!) 142/78  Pulse: (!) 105   No LMP recorded. (Menstrual status: IUD).  General: NAD HEENT: normocephalic, anicteric Pulmonary: No increased work of breathing Genitourinary:  External: Normal external female genitalia.  Normal urethral meatus, normal  Bartholin's and Skene's glands.    Vagina: Normal vaginal mucosa, no evidence of prolapse.    Cervix: Grossly normal in appearance, no bleeding, IUD strings visualized 3cm  Uterus: Non-enlarged, mobile, normal contour.  No CMT  Adnexa: ovaries non-enlarged, no adnexal masses  Rectal: deferred  Lymphatic: no evidence of inguinal lymphadenopathy Extremities: no edema, erythema, or tenderness Neurologic: Grossly intact Psychiatric: mood appropriate, affect full  Female chaperone present for pelvic  portions of the physical exam  Assessment: 48 y.o. G3P3 contraception consult  Plan: Problem List Items Addressed This Visit    None    Visit Diagnoses     Perimenopause    -  Primary   Relevant Orders   Follicle stimulating hormone (Completed)   Estradiol (Completed)   IUD check up          1) Mirena - initially placement 11/14/2012. Discussed data supporting 7 years of use.  We also discussed that average age of menopause is 7 and that she may not need another IUD following the removal of this one.   - FSH estradiol with plan of potentially having patient transition into menopause as opposed to having to replace IUD  2) A total of 15 minutes were spent in face-to-face contact with the patient during this encounter with over half of that time devoted to counseling and coordination of care.  3) Return in about 1 year (around 03/27/2019) for annual.   Vena Austria, MD, [facility], [facility]

## 2018-03-27 LAB — ESTRADIOL: Estradiol: 16.1 pg/mL

## 2018-03-27 LAB — FOLLICLE STIMULATING HORMONE: FSH: 32.1 m[IU]/mL

## 2018-04-03 ENCOUNTER — Encounter: Payer: Self-pay | Admitting: Physician Assistant

## 2018-04-03 ENCOUNTER — Ambulatory Visit (INDEPENDENT_AMBULATORY_CARE_PROVIDER_SITE_OTHER): Payer: Self-pay | Admitting: Physician Assistant

## 2018-04-03 VITALS — BP 172/98 | HR 88 | Temp 98.4°F | Resp 14 | Wt 230.0 lb

## 2018-04-03 DIAGNOSIS — J069 Acute upper respiratory infection, unspecified: Secondary | ICD-10-CM

## 2018-04-03 DIAGNOSIS — I10 Essential (primary) hypertension: Secondary | ICD-10-CM

## 2018-04-03 MED ORDER — ALBUTEROL SULFATE HFA 108 (90 BASE) MCG/ACT IN AERS: 2.0000 | INHALATION_SPRAY | RESPIRATORY_TRACT | 0 refills | Status: DC | PRN

## 2018-04-03 MED ORDER — PROMETHAZINE-DM 6.25-15 MG/5ML PO SYRP: 5.0000 mL | ORAL_SOLUTION | Freq: Every day | ORAL | 0 refills | Status: DC

## 2018-04-03 MED ORDER — AZELASTINE HCL 0.1 % NA SOLN: 2.0000 | Freq: Two times a day (BID) | NASAL | 0 refills | Status: DC

## 2018-04-03 NOTE — Progress Notes (Signed)
Patient ID: Kathleen Conley DOB: Nov 08, 1970 AGE: 48 y.o. MRN: [id number]   PCP: Alba CorySowles, Krichna, MD   Chief Complaint:  Chief Complaint  Patient presents with  . cough, sneezing and headache    x2days (mucinex)     Subjective:    HPI:  Kathleen Conley is a 48 y.o. female presents for evaluation  Chief Complaint  Patient presents with  . cough, sneezing and headache    x2days (mucinex)    48 year old female [facility] to [facility] with four day history of URI symptoms. Began with sore throat. Then developed dry cough. Then developed headache, nasal congestion and sweats. Reports wheezing when lying flat. Has taken OTC Mucinex Maximum Strength with minimal symptom relief. Denies fever, chills, body aches, ear pain, sinus pain, chest pain, SOB, abdominal pain, nausea/vomiting, diarrhea. Patient did receive this season's influenza vaccination. Patient with co-worker recently diagnosed with influenza.  [facility] ED on 03/19/2018 with complaint of dizziness. Duration of 2-3 days. Associated lightheadedness and diaphoresis. Believed correlated with starting Maxide. Labs unremarkable (elevated WBC count). Reassuring EKG. VSS. Patient was advised to hold Maxide and contact PCP for further management.  A limited review of symptoms was performed, pertinent positives and negatives as mentioned in HPI.  The following portions of the patient's history were reviewed and updated as appropriate: allergies, current medications and past medical history.  Patient Active Problem List   Diagnosis Date Noted  . Post-op pain 12/01/2016  . 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 Flat foot 01/28/2016  . Aortic sclerosis 10/14/2015  . Midline low back pain without sciatica 09/20/2015  . Leukocytosis 11/29/2014  . Osteoarthritis of left knee 11/20/2014  . History of ventral hernia repair 10/27/2014  . Allergic rhinitis 08/17/2014  . Axillary hidradenitis suppurativa  08/17/2014  . Baker cyst 08/17/2014  . Chronic constipation 08/17/2014  . 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 Engages in travel abroad 08/17/2014  . Fibromyalgia 08/17/2014  . Cardiac murmur 08/17/2014  . Anemia, iron deficiency 08/17/2014  . Excessive and frequent menstruation 08/17/2014  . Dysmetabolic syndrome 08/17/2014  . Plantar fasciitis 08/17/2014  . Beat, premature ventricular 08/17/2014  . 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 Obstructive apnea 12/02/2013  . Essential (primary) hypertension 12/09/2009  . Hypertrichosis 12/09/2009  . Extreme obesity 12/09/2009    Allergies  Allergen Reactions  . Lisinopril Cough       . Norvasc [Amlodipine Besylate]     Current Outpatient Medications on File Prior to Visit  Medication Sig Dispense Refill  . acetaminophen (TYLENOL) 500 MG tablet Take 1 tablet (500 mg total) by mouth every 6 (six) hours as needed. Max of 3 grams daily or 6 pills dialy (Patient taking differently: Take 1,000 mg by mouth every 6 (six) hours as needed for moderate pain or headache. ) 30 tablet 0  . Armodafinil (NUVIGIL) 150 MG tablet Take 1 tablet (150 mg total) by mouth daily. 30 tablet 2  . aspirin EC 81 MG tablet Take 1 tablet (81 mg total) by mouth daily. 30 tablet 0  . buPROPion (WELLBUTRIN XL) 150 MG 24 hr tablet Take 1 tablet (150 mg total) by mouth daily. 90 tablet 0  . Cetirizine HCl (ZYRTEC ALLERGY) 10 MG CAPS Take 1 capsule by mouth as needed.    . Cholecalciferol (VITAMIN D) 2000 UNITS tablet Take 2,000 Units by mouth daily.     Marland Kitchen. docusate sodium (COLACE) 100 MG capsule Take  100 mg by mouth daily as needed for moderate constipation.     . DULoxetine (CYMBALTA) 60 MG capsule Take 1 capsule (60 mg total) by mouth daily. 90 capsule 0  . ferrous sulfate 325 (65 FE) MG tablet Take 325 mg by mouth daily.     . fluticasone (FLONASE) 50 MCG/ACT nasal spray Place 2 sprays into both  nostrils daily as needed for allergies. 16 g 1  . hydrOXYzine (ATARAX/VISTARIL) 10 MG tablet Take 1 tablet (10 mg total) by mouth 3 (three) times daily as needed. 30 tablet 0  . ibuprofen (ADVIL,MOTRIN) 200 MG tablet Take 400 mg by mouth every 6 (six) hours as needed for headache or moderate pain.    Marland Kitchen levonorgestrel (MIRENA, 52 MG,) 20 MCG/24HR IUD 1 each by Intrauterine route once.     . metaxalone (SKELAXIN) 800 MG tablet Take 1 tablet (800 mg total) by mouth 3 (three) times daily as needed for muscle spasms. 90 tablet 1  . Multiple Vitamins-Minerals (MULTIVITAMIN PO) Take 1 tablet by mouth daily.    Marland Kitchen olmesartan (BENICAR) 40 MG tablet Take 1 tablet (40 mg total) by mouth daily. 90 tablet 1  . TRAVATAN Z 0.004 % SOLN ophthalmic solution Place 1 drop into both eyes at bedtime.  3  . traZODone (DESYREL) 50 MG tablet Take 0.5-1 tablets (25-50 mg total) by mouth at bedtime as needed for sleep. 30 tablet 2   No current facility-administered medications on file prior to visit.        Objective:   Vitals:   04/03/18 1354  BP: (!) 172/98  Pulse: 88  Resp: 14  Temp: 98.4 F (36.9 C)  SpO2: 98%     Wt Readings from Last 3 Encounters:  04/03/18 230 lb (104.3 kg)  03/26/18 239 lb (108.4 kg)  03/19/18 238 lb (108 kg)    Physical Exam:   General Appearance:  Patient sitting comfortably on examination table. Conversational. Peri Jefferson self-historian. In no acute distress. Afebrile.   Head:  Normocephalic, without obvious abnormality, atraumatic  Eyes:  PERRL, conjunctiva/corneas clear, EOM's intact  Ears:  Bilateral ear canals WNL. No erythema or edema. No discharge/drainage. Bilateral TMs WNL. No erythema, injection, or serous effusion. No scar tissue.  Nose: Nares normal, septum midline. Nasal mucosa with minimal edema and faint erythema. No active bleeding. Scant clear rhinorrhea. No sinus tenderness with percussion/palpation.  Throat: Lips, mucosa, and tongue normal; teeth and gums normal.  Throat reveals no erythema. Tonsils with no enlargement or exudate.  Neck: Supple, symmetrical, trachea midline, no adenopathy  Lungs:   Clear to auscultation bilaterally, respirations unlabored. Good aeration. No wheezing, rales, rhonchi, or crackles. No cough elicited with deep inspiration. No wheezing with forced expiration.  Heart:  Regular rate and rhythm, 3/6 systolic cardiac murmur (known to patient, aortic stenosis)  Extremities: Extremities normal, atraumatic, no cyanosis or edema  Pulses: 2+ and symmetric  Skin: Skin color, texture, turgor normal, no rashes or lesions  Lymph nodes: Cervical, supraclavicular, and axillary nodes normal  Neurologic: Normal    Assessment & Plan:    Exam findings, diagnosis etiology and medication use and indications reviewed with patient. Follow-Up and discharge instructions provided. No emergent/urgent issues found on exam.  Patient education was provided.   Patient verbalized understanding of information provided and agrees with plan of care (POC), all questions answered. The patient is advised to call or return to clinic if condition does not see an improvement in symptoms, or to seek the care of the  closest emergency department if condition worsens with the below plan.    1. Upper respiratory tract infection, unspecified type  2. Uncontrolled hypertension  Patient with four day history of URI symptoms. Sore throat, to cough, associated nasal congestion. VSS (elevated BP), afebrile, in no acute distress, clear lung sounds, 98% pulse ox. Suspect patient has self-limited viral URI. Negative rapid flu test in office. Prescribed Astelin nasal spray, Phenergan-DM cough syrup, and albuterol inhaler (states has worked well for her previously for cough) for symptom relief. Advised patient follow-up with PCP, urgent care, or [facility] in 4-5 days if symptoms not improving.    Patient with known uncontrolled hypertension. In the process of working on blood  pressure management with PCP. Blood pressure 172/98 at today's office visit. Patient states she has not taken her blood pressure medication yet today. Patient not sure if the Mucinex medication she is taking has a decongestant or not. Advised patient not to use OTC decongestants. Advised patient call PCP to discuss uncontrolled BP. Advised patient go directly to the ED if she develops worsening headache, change in vision, dizziness/lightheadedness, nausea/vomiting, extremity paresthesias, etc. Patient agreed with plan.   Janalyn HarderSamantha Wanya Bangura, MHS, PA-C Rulon SeraSamantha F. Emanuele Mcwhirter, MHS, PA-C Advanced Practice Provider [facility]  [facility]  [address], [facility], [address]st Floor [place], [place] [address] (p):  [phone] [email or web address]

## 2018-04-03 NOTE — Patient Instructions (Signed)
Thank you for choosing [facility] for your health care needs.  You have been diagnosed with an upper respiratory infection (a cold).  Your rapid flu test was NEGATIVE.  You have been prescribed Astelin nasal spray. You have been prescribed Phenergan-DM cough syrup (to take at night).  Recommend increase fluids. Rest. May use Tylenol for pain, head ache, fever. May use cough drops for cough during work shift.  Follow-up with family physician, urgent care, or [facility] in 4-5 days if symptoms not improving.  Your blood pressure was elevated today. Take blood pressure medication as prescribed. Do NOT take any over the counter decongestants such as Sudafed, Claritin-D (the D stands for decongestant), Mucinex-D, etc.  Call your family physician, let them know about your elevated blood pressure.  Go directly to the ED if you develop worsening headache, change in vision, dizziness, chest pain, arm/leg weakness/tingling, nausea/vomiting, or other new/concerning symptom.  Upper Respiratory Infection, Adult An upper respiratory infection (URI) affects the nose, throat, and upper air passages. URIs are caused by germs (viruses). The most common type of URI is often called "the common cold." Medicines cannot cure URIs, but you can do things at home to relieve your symptoms. URIs usually get better within 7-10 days. Follow these instructions at home: Activity  Rest as needed.  If you have a fever, stay home from work or school until your fever is gone, or until your doctor says you may return to work or school. ? You should stay home until you cannot spread the infection anymore (you are not contagious). ? Your doctor may have you wear a face mask so you have less risk of spreading the infection. Relieving symptoms  Gargle with a salt-water mixture 3-4 times a day or as needed. To make a salt-water mixture, completely dissolve -1 tsp of salt in 1 cup of warm water.  Use a cool-mist humidifier  to add moisture to the air. This can help you breathe more easily. Eating and drinking   Drink enough fluid to keep your pee (urine) pale yellow.  Eat soups and other clear broths. General instructions   Take over-the-counter and prescription medicines only as told by your doctor. These include cold medicines, fever reducers, and cough suppressants.  Do not use any products that contain nicotine or tobacco. These include cigarettes and e-cigarettes. If you need help quitting, ask your doctor.  Avoid being where people are smoking (avoid secondhand smoke).  Make sure you get regular shots and get the flu shot every year.  Keep all follow-up visits as told by your doctor. This is important. How to avoid spreading infection to others   Wash your hands often with soap and water. If you do not have soap and water, use hand sanitizer.  Avoid touching your mouth, face, eyes, or nose.  Cough or sneeze into a tissue or your sleeve or elbow. Do not cough or sneeze into your hand or into the air. Contact a doctor if:  You are getting worse, not better.  You have any of these: ? A fever. ? Chills. ? Brown or red mucus in your nose. ? Yellow or brown fluid (discharge)coming from your nose. ? Pain in your face, especially when you bend forward. ? Swollen neck glands. ? Pain with swallowing. ? White areas in the back of your throat. Get help right away if:  You have shortness of breath that gets worse.  You have very bad or constant: ? Headache. ? Ear pain. ? Pain  in your forehead, behind your eyes, and over your cheekbones (sinus pain). ? Chest pain.  You have long-lasting (chronic) lung disease along with any of these: ? Wheezing. ? Long-lasting cough. ? Coughing up blood. ? A change in your usual mucus.  You have a stiff neck.  You have changes in your: ? Vision. ? Hearing. ? Thinking. ? Mood. Summary  An upper respiratory infection (URI) is caused by a germ  called a virus. The most common type of URI is often called "the common cold."  URIs usually get better within 7-10 days.  Take over-the-counter and prescription medicines only as told by your doctor. This information is not intended to replace advice given to you by your health care provider. Make sure you discuss any questions you have with your health care provider. Document Released: 07/26/2007 Document Revised: 09/29/2016 Document Reviewed: 09/29/2016 Elsevier Interactive Patient Education  2019 [employer].

## 2018-04-05 ENCOUNTER — Telehealth: Payer: Self-pay | Admitting: Emergency Medicine

## 2018-04-05 NOTE — Telephone Encounter (Signed)
Left message following up on visit with [facility] 

## 2018-04-17 ENCOUNTER — Ambulatory Visit: Payer: 59 | Admitting: Family Medicine

## 2018-04-17 ENCOUNTER — Encounter: Payer: Self-pay | Admitting: Family Medicine

## 2018-04-17 DIAGNOSIS — M797 Fibromyalgia: Secondary | ICD-10-CM

## 2018-04-17 DIAGNOSIS — J302 Other seasonal allergic rhinitis: Secondary | ICD-10-CM | POA: Diagnosis not present

## 2018-04-17 DIAGNOSIS — I10 Essential (primary) hypertension: Secondary | ICD-10-CM

## 2018-04-17 DIAGNOSIS — G4733 Obstructive sleep apnea (adult) (pediatric): Secondary | ICD-10-CM | POA: Diagnosis not present

## 2018-04-17 DIAGNOSIS — G4726 Circadian rhythm sleep disorder, shift work type: Secondary | ICD-10-CM | POA: Diagnosis not present

## 2018-04-17 DIAGNOSIS — F330 Major depressive disorder, recurrent, mild: Secondary | ICD-10-CM

## 2018-04-17 MED ORDER — OLMESARTAN MEDOXOMIL 40 MG PO TABS: 40.0000 mg | ORAL_TABLET | Freq: Every day | ORAL | 1 refills | Status: DC

## 2018-04-17 MED ORDER — FLUTICASONE PROPIONATE 50 MCG/ACT NA SUSP: 2.0000 | Freq: Every day | NASAL | 1 refills | Status: DC | PRN

## 2018-04-17 MED ORDER — ARMODAFINIL 150 MG PO TABS: 150.0000 mg | ORAL_TABLET | Freq: Every day | ORAL | 2 refills | Status: DC

## 2018-04-17 MED ORDER — DULOXETINE HCL 60 MG PO CPEP: 60.0000 mg | ORAL_CAPSULE | Freq: Every day | ORAL | 0 refills | Status: DC

## 2018-04-17 MED ORDER — METAXALONE 800 MG PO TABS: 800.0000 mg | ORAL_TABLET | Freq: Three times a day (TID) | ORAL | 1 refills | Status: DC | PRN

## 2018-04-17 MED ORDER — BUPROPION HCL ER (XL) 150 MG PO TB24: 150.0000 mg | ORAL_TABLET | Freq: Every day | ORAL | 0 refills | Status: DC

## 2018-04-17 NOTE — Progress Notes (Signed)
Name: Kathleen Conley   MRN: [id number]    DOB: 29-Oct-1970   Date:04/17/2018       Progress Note  Subjective  Chief Complaint  Chief Complaint  Patient presents with  . Medication Refill  . Hypertension    maxide made her very dizzy and feel horrible-the hospital stopped it  . Anxiety  . Depression  . Fibromyalgia    Has been ok     HPI  Major depression: she is doing well, but worried about seasonal affective disorder, she is back on Wellbutrin since Fall and she states she likes the way it gives her energy and does not want to stop at this time. She states adjusting to having her husband and step daughter with her.   Metabolic Syndrome and Obesity: we tried giving her Trulicity but needs step therapy, she did not like  Metformin - caused diarrhea . She denies polyphagia, polyuria or polydipsia.Discussed life style modification again   Obesity: she has a long history of obesity, started after her divorce when she was in her 5's. She tried weight watchers but was unsuccessful.She is a stress eater, she is now working 2nd shift instead of 3rd, but goes home and watches TV until late and sleeps in the am. Husband moved in Nov 2019 and she has been trying to go to sleep by midnight, not eating as much at night. She has been eating more rice since husband moved in.   FMS: she states pain is better with Cymbalta, could not tolerate Lyrica ( caused nightmares and vivid dreams), pain level on Cymbalta is0/10,she takes skelaxin about once a day for leg cramps. She has a gym at her apartment complex, discussed trying elliptical.     OSA: she has mild symptoms, not on CPAP, avoiding sleeping on her back,  sleeping on her side, she is not waking up with headaches. She also has shift work sleep disorder and states nuvigil helps her stay awake.  HTN: bp is finally at goal, she is back on Benicar 40, no cough, she tried maxzide and got dizzy, norvasc caused swelling, she states bp was  spiking when she was taking ibuprofen for her back and since she switched to tylenol bp is back to normal . No chest pain or palpitation.   Insomnia: she states she has not been taking medication at this time, sleeping better now, going to bed at midnight and getting up around 6 am to get her son from work , she is taking a nap before work   Leucocytosis and anemia: she has seen hematologist in the past and does not want to go back at this time. She has hidradenitis suppurative .    Patient Active Problem List   Diagnosis Date Noted  . Post-op pain 12/01/2016  . 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 Flat foot 01/28/2016  . Aortic sclerosis 10/14/2015  . Midline low back pain without sciatica 09/20/2015  . Leukocytosis 11/29/2014  . Osteoarthritis of left knee 11/20/2014  . History of ventral hernia repair 10/27/2014  . Allergic rhinitis 08/17/2014  . Axillary hidradenitis suppurativa 08/17/2014  . Baker cyst 08/17/2014  . Chronic constipation 08/17/2014  . 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 Engages in travel abroad 08/17/2014  . Fibromyalgia 08/17/2014  . Cardiac murmur 08/17/2014  . Anemia, iron deficiency 08/17/2014  . Excessive and frequent menstruation 08/17/2014  . Dysmetabolic syndrome 08/17/2014  . Plantar fasciitis 08/17/2014  . Beat, premature ventricular 08/17/2014  .  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 Obstructive apnea 12/02/2013  . Essential (primary) hypertension 12/09/2009  . Hypertrichosis 12/09/2009  . Extreme obesity 12/09/2009    Past Surgical History:  Procedure Laterality Date  . FOOT ARTHRODESIS Right 12/01/2016   Procedure: ARTHRODESIS FOOT; TRIPLE;  Surgeon: [name], DPM;  Location: [facility] ORS;  Service: Podiatry;  Laterality: Right;  . HERNIA REPAIR  10/09/11   V[facility]ral Incarcerated- Dr. [name]  . OOPHORECTOMY Left 12/09/2009  . TUBAL LIGATION  1993  . VENTRAL  HERNIA REPAIR  10/09/2011    Family History  Problem Relation Age of Onset  . Hypertension Mother   . CVA Mother   . Kidney disease Mother   . Congenital heart disease Mother   . Heart disease Father   . Hypertension Father   . Diabetes Father   . Breast cancer Paternal Grandmother        Bilateral  . Colon cancer Maternal Grandfather   . Colon cancer Maternal Uncle     Social History   Socioeconomic History  . Marital status: Married    Spouse name: Not on file  . Number of children: 3  . Years of education: Associates  . Highest education level: Associate degree: academic program  Occupational History  . Occupation: LPN at the [employer]  Social Needs  . Financial resource strain: Not hard at all  . Food insecurity:    Worry: Never true    Inability: Never true  . Transportation needs:    Medical: No    Non-medical: No  Tobacco Use  . Smoking status: Never Smoker  . Smokeless tobacco: Never Used  Substance and Sexual Activity  . Alcohol use: No    Alcohol/week: 0.0 standard drinks  . Drug use: No  . Sexual activity: Yes    Partners: Male    Birth control/protection: I.U.D.  Lifestyle  . Physical activity:    Days per week: 0 days    Minutes per session: Not on file  . Stress: Not at all  Relationships  . Social connections:    Talks on phone: More than three times a week    Gets together: Never    Attends religious service: More than 4 times per year    Active member of club or organization: No    Attends meetings of clubs or organizations: Never    Relationship status: Never married  . Intimate partner violence:    Fear of current or ex partner: No    Emotionally abused: No    Physically abused: No    Forced sexual activity: No  Other Topics Concern  . Not on file  Social History Narrative   Married Moses in 2016 in [place] and he finally got visa to move to [place] Nov 2019     Current Outpatient Medications:  .  acetaminophen (TYLENOL)  500 MG tablet, Take 1 tablet (500 mg total) by mouth every 6 (six) hours as needed. Max of 3 grams daily or 6 pills dialy (Patient taking differently: Take 1,000 mg by mouth every 6 (six) hours as needed for moderate pain or headache. ), Disp: 30 tablet, Rfl: 0 .  albuterol (PROVENTIL HFA;VENTOLIN HFA) 108 (90 Base) MCG/ACT inhaler, Inhale 2 puffs into the lungs every 4 (four) hours as needed., Disp: 1 Inhaler, Rfl: 0 .  Armodafinil (NUVIGIL) 150 MG tablet, Take 1 tablet (150 mg total) by mouth daily., Disp: 30 tablet, Rfl: 2 .  aspirin EC  81 MG tablet, Take 1 tablet (81 mg total) by mouth daily., Disp: 30 tablet, Rfl: 0 .  azelastine (ASTELIN) 0.1 % nasal spray, Place 2 sprays into both nostrils 2 (two) times daily. Use in each nostril as directed, Disp: 30 mL, Rfl: 0 .  buPROPion (WELLBUTRIN XL) 150 MG 24 hr tablet, Take 1 tablet (150 mg total) by mouth daily., Disp: 90 tablet, Rfl: 0 .  Cetirizine HCl (ZYRTEC ALLERGY) 10 MG CAPS, Take 1 capsule by mouth as needed., Disp: , Rfl:  .  Cholecalciferol (VITAMIN D) 2000 UNITS tablet, Take 2,000 Units by mouth daily. , Disp: , Rfl:  .  docusate sodium (COLACE) 100 MG capsule, Take 100 mg by mouth daily as needed for moderate constipation. , Disp: , Rfl:  .  DULoxetine (CYMBALTA) 60 MG capsule, Take 1 capsule (60 mg total) by mouth daily., Disp: 90 capsule, Rfl: 0 .  ferrous sulfate 325 (65 FE) MG tablet, Take 325 mg by mouth daily. , Disp: , Rfl:  .  fluticasone (FLONASE) 50 MCG/ACT nasal spray, Place 2 sprays into both nostrils daily as needed for allergies., Disp: 16 g, Rfl: 1 .  hydrOXYzine (ATARAX/VISTARIL) 10 MG tablet, Take 1 tablet (10 mg total) by mouth 3 (three) times daily as needed., Disp: 30 tablet, Rfl: 0 .  levonorgestrel (MIRENA, 52 MG,) 20 MCG/24HR IUD, 1 each by Intrauterine route once. , Disp: , Rfl:  .  metaxalone (SKELAXIN) 800 MG tablet, Take 1 tablet (800 mg total) by mouth 3 (three) times daily as needed for muscle spasms., Disp: 90  tablet, Rfl: 1 .  Multiple Vitamins-Minerals (MULTIVITAMIN PO), Take 1 tablet by mouth daily., Disp: , Rfl:  .  olmesartan (BENICAR) 40 MG tablet, Take 1 tablet (40 mg total) by mouth daily., Disp: 90 tablet, Rfl: 1 .  promethazine-dextromethorphan (PROMETHAZINE-DM) 6.25-15 MG/5ML syrup, Take 5 mLs by mouth at bedtime., Disp: 118 mL, Rfl: 0 .  TRAVATAN Z 0.004 % SOLN ophthalmic solution, Place 1 drop into both eyes at bedtime., Disp: , Rfl: 3 .  traZODone (DESYREL) 50 MG tablet, Take 0.5-1 tablets (25-50 mg total) by mouth at bedtime as needed for sleep., Disp: 30 tablet, Rfl: 2 .  ibuprofen (ADVIL,MOTRIN) 200 MG tablet, Take 400 mg by mouth every 6 (six) hours as needed for headache or moderate pain., Disp: , Rfl:   Allergies  Allergen Reactions  . Lisinopril Cough       . Norvasc [Amlodipine Besylate]     I personally reviewed active problem list, medication list, allergies, family history, social history with the patient/caregiver today.   ROS  Constitutional: Negative for fever positive for  weight change.  Respiratory: Negative for cough and shortness of breath.   Cardiovascular: Negative for chest pain or palpitations.  Gastrointestinal: Negative for abdominal pain, no bowel changes.  Musculoskeletal: positive  for gait problem but no  joint swelling.  Skin: Negative for rash.  Neurological: Negative for dizziness or headache.  No other specific complaints in a complete review of systems (except as listed in HPI above).  Objective  Vitals:   04/17/18 0852  BP: 132/84  Pulse: 82  Resp: 16  Temp: 98.3 F (36.8 C)  TempSrc: Oral  SpO2: 98%  Weight: 238 lb 3.2 oz (108 kg)  Height: 5\' 2"  (1.575 m)    Body mass index is 43.57 kg/m.  Physical Exam  Constitutional: Patient appears well-developed and well-nourished. Obese  No distress.  HEENT: head atraumatic, normocephalic, pupils equal and reactive  to light, neck supple, throat within normal limits Strabismus   Cardiovascular: Normal rate, regular rhythm and normal heart sounds.  No murmur heard. No BLE edema. Pulmonary/Chest: Effort normal and breath sounds normal. No respiratory distress. Abdominal: Soft.  There is no tenderness. Psychiatric: Patient has a normal mood and affect. behavior is normal. Judgment and thought content normal.  Recent Results (from the past 2160 hour(s))  POCT Urinalysis Dipstick     Status: Abnormal   Collection Time: 02/14/18  3:27 PM  Result Value Ref Range   Color, UA lt yellow    Clarity, UA clear    Glucose, UA Negative Negative   Bilirubin, UA neg    Ketones, UA neg    Spec Grav, UA 1.015 1.010 - 1.025   Blood, UA pos    pH, UA 6.0 5.0 - 8.0   Protein, UA Positive (A) Negative   Urobilinogen, UA 0.2 0.2 or 1.0 E.U./dL   Nitrite, UA neg    Leukocytes, UA Moderate (2+) (A) Negative   Appearance     Odor    POCT urine pregnancy     Status: Normal   Collection Time: 02/14/18  3:29 PM  Result Value Ref Range   Preg Test, Ur Negative Negative  POCT Urinalysis Dipstick     Status: Abnormal   Collection Time: 02/28/18 11:41 AM  Result Value Ref Range   Color, UA yellow    Clarity, UA clear    Glucose, UA Negative Negative   Bilirubin, UA neg    Ketones, UA neg    Spec Grav, UA 1.020 1.010 - 1.025   Blood, UA 5    pH, UA 5.0 5.0 - 8.0   Protein, UA Positive (A) Negative   Urobilinogen, UA 0.2 0.2 or 1.0 E.U./dL   Nitrite, UA neg    Leukocytes, UA Negative Negative   Appearance     Odor    POCT urine pregnancy     Status: Normal   Collection Time: 02/28/18 11:41 AM  Result Value Ref Range   Preg Test, Ur Negative Negative  Urine cytology ancillary only     Status: None   Collection Time: 03/14/18 12:00 AM  Result Value Ref Range   Chlamydia Negative     Comment: Normal Reference Range - Negative   Neisseria gonorrhea Negative     Comment: Normal Reference Range - Negative  Urine Culture     Status: None   Collection Time: 03/14/18 10:41 AM   Result Value Ref Range   MICRO NUMBER: [id number]    SPECIMEN QUALITY: Adequate    Sample Source URINE, CLEAN CATCH    STATUS: FINAL    Result:      10,000-50,000 CFU/mL of Mixed non-uropathogenic Gram positive flora.  Urine Microalbumin w/creat. ratio     Status: Abnormal   Collection Time: 03/14/18 10:41 AM  Result Value Ref Range   Creatinine, Urine 197 20 - 275 mg/dL   Microalb, Ur 45.9 mg/dL    Comment: Reference Range Not established    Microalb Creat Ratio 117 (H) <30 mcg/mg creat    Comment: . The ADA defines abnormalities in albumin excretion as follows: Marland Kitchen Category         Result (mcg/mg creatinine) . Normal                    <30 Microalbuminuria         30-299  Clinical albuminuria   > OR = 300 . The ADA  recommends that at least two of three specimens collected within a 3-6 month period be abnormal before considering a patient to be within a diagnostic category.   Glucose, capillary     Status: Abnormal   Collection Time: 03/19/18 11:13 AM  Result Value Ref Range   Glucose-Capillary 117 (H) 70 - 99 mg/dL  Basic metabolic panel     Status: Abnormal   Collection Time: 03/19/18 11:14 AM  Result Value Ref Range   Sodium 138 135 - 145 mmol/L   Potassium 3.8 3.5 - 5.1 mmol/L   Chloride 104 98 - 111 mmol/L   CO2 27 22 - 32 mmol/L   Glucose, Bld 115 (H) 70 - 99 mg/dL   BUN 24 (H) 6 - 20 mg/dL   Creatinine, Ser 1.61 0.44 - 1.00 mg/dL   Calcium 9.4 8.9 - 09.6 mg/dL   GFR calc non Af Amer >60 >60 mL/min   GFR calc Af Amer >60 >60 mL/min   Anion gap 7 5 - 15    Comment: Performed at [facility], [address] [place], [place] [address]  CBC     Status: Abnormal   Collection Time: 03/19/18 11:14 AM  Result Value Ref Range   WBC 14.1 (H) 4.0 - 10.5 K/uL   RBC 5.17 (H) 3.87 - 5.11 MIL/uL   Hemoglobin 13.3 12.0 - 15.0 g/dL   HCT 98.1 19.1 - 47.8 %   MCV 84.3 80.0 - 100.0 fL   MCH 25.7 (L) 26.0 - 34.0 pg   MCHC 30.5 30.0 - 36.0 g/dL   RDW 29.5 (H)  62.1 - 15.5 %   Platelets 380 150 - 400 K/uL   nRBC 0.0 0.0 - 0.2 %    Comment: Performed at [facility], [address] [place], [place] [address]  Urinalysis, Complete w Microscopic     Status: Abnormal   Collection Time: 03/19/18 11:14 AM  Result Value Ref Range   Color, Urine YELLOW (A) YELLOW   APPearance CLEAR (A) CLEAR   Specific Gravity, Urine 1.025 1.005 - 1.030   pH 5.0 5.0 - 8.0   Glucose, UA NEGATIVE NEGATIVE mg/dL   Hgb urine dipstick SMALL (A) NEGATIVE   Bilirubin Urine NEGATIVE NEGATIVE   Ketones, ur NEGATIVE NEGATIVE mg/dL   Protein, ur NEGATIVE NEGATIVE mg/dL   Nitrite NEGATIVE NEGATIVE   Leukocytes, UA NEGATIVE NEGATIVE   RBC / HPF 0-5 0 - 5 RBC/hpf   WBC, UA 0-5 0 - 5 WBC/hpf   Bacteria, UA RARE (A) NONE SEEN   Squamous Epithelial / LPF 0-5 0 - 5   Mucus PRESENT    Hyaline Casts, UA PRESENT     Comment: Performed at [facility], [address] [place], [place] [address]  Pregnancy, urine POC     Status: None   Collection Time: 03/19/18 11:21 AM  Result Value Ref Range   Preg Test, Ur NEGATIVE NEGATIVE    Comment:        THE SENSITIVITY OF THIS METHODOLOGY IS >24 mIU/mL   Follicle stimulating hormone     Status: None   Collection Time: 03/26/18 10:49 AM  Result Value Ref Range   FSH 32.1 mIU/mL    Comment:                     Adult Female:                       Follicular phase  3.5 -  12.5                       Ovulation phase       4.7 -  21.5                       Luteal phase          1.7 -   7.7                       Postmenopausal       25.8 - 134.8   Estradiol     Status: None   Collection Time: 03/26/18 10:49 AM  Result Value Ref Range   Estradiol 16.1 pg/mL    Comment:                     Adult Female:                       Follicular phase   12.5 -   166.0                       Ovulation phase    85.8 -   498.0                       Luteal phase       43.8 -   211.0                       Postmenopausal     <6.0  -    54.7                     Pregnancy                       1st trimester     215.0 - >4300.0                     Girls (1-10 years)    6.0 -    27.0 Roche ECLIA methodology       PHQ2/9: Depression screen [facility] 2/9 04/17/2018 03/14/2018 01/11/2018 12/13/2017 10/10/2017  Decreased Interest 0 0 1 0 0  Down, Depressed, Hopeless 1 0 1 1 0  PHQ - 2 Score 1 0 2 1 0  Altered sleeping 1 0 1 2 0  Tired, decreased energy 1 1 1  0 1  Change in appetite 0 1 1 1  0  Feeling bad or failure about yourself  1 0 1 0 0  Trouble concentrating 0 0 1 0 0  Moving slowly or fidgety/restless 0 0 0 0 0  Suicidal thoughts 0 0 0 0 0  PHQ-9 Score 4 2 7 4 1   Difficult doing work/chores Somewhat difficult Not difficult at all Not difficult at all Not difficult at all Not difficult at all  Some recent data might be hidden   PHQ9 reviewed, on medication   Fall Risk: Fall Risk  04/17/2018 03/14/2018 01/11/2018 12/13/2017 10/10/2017  Falls in the past year? 0 0 0 No No  Number falls in past yr: - - 0 - -  Injury with Fall? - - 0 - -    Functional Status Survey: Is the patient deaf or have difficulty hearing?: No Does the patient have difficulty seeing, even when wearing glasses/contacts?: Yes Does  the patient have difficulty concentrating, remembering, or making decisions?: No Does the patient have difficulty walking or climbing stairs?: No Does the patient have difficulty dressing or bathing?: No Does the patient have difficulty doing errands alone such as visiting a doctor's office or shopping?: No    Assessment & Plan  1. Obstructive apnea  Not significant enough for CPAP  - Armodafinil (NUVIGIL) 150 MG tablet; Take 1 tablet (150 mg total) by mouth daily.  Dispense: 30 tablet; Refill: 2  2. Shift work sleep disorder  - Armodafinil (NUVIGIL) 150 MG tablet; Take 1 tablet (150 mg total) by mouth daily.  Dispense: 30 tablet; Refill: 2  3. Depression, major, recurrent, mild (HCC)  - buPROPion  (WELLBUTRIN XL) 150 MG 24 hr tablet; Take 1 tablet (150 mg total) by mouth daily.  Dispense: 90 tablet; Refill: 0 - DULoxetine (CYMBALTA) 60 MG capsule; Take 1 capsule (60 mg total) by mouth daily.  Dispense: 90 capsule; Refill: 0  4. Fibromyalgia  - DULoxetine (CYMBALTA) 60 MG capsule; Take 1 capsule (60 mg total) by mouth daily.  Dispense: 90 capsule; Refill: 0 - metaxalone (SKELAXIN) 800 MG tablet; Take 1 tablet (800 mg total) by mouth 3 (three) times daily as needed for muscle spasms.  Dispense: 90 tablet; Refill: 1  5. Seasonal allergic rhinitis, unspecified trigger  - fluticasone (FLONASE) 50 MCG/ACT nasal spray; Place 2 sprays into both nostrils daily as needed for allergies.  Dispense: 16 g; Refill: 1  6. Essential (primary) hypertension  - olmesartan (BENICAR) 40 MG tablet; Take 1 tablet (40 mg total) by mouth daily.  Dispense: 90 tablet; Refill: 1  7. Morbid obesity (HCC)  Discussed with the patient the risk posed by an increased BMI. Discussed importance of portion control, calorie counting and at least 150 minutes of physical activity weekly. Avoid sweet beverages and drink more water. Eat at least 6 servings of fruit and vegetables daily

## 2018-05-14 DIAGNOSIS — M76822 Posterior tibial tendinitis, left leg: Secondary | ICD-10-CM | POA: Diagnosis not present

## 2018-05-14 DIAGNOSIS — M25572 Pain in left ankle and joints of left foot: Secondary | ICD-10-CM | POA: Diagnosis not present

## 2018-05-14 DIAGNOSIS — M25372 Other instability, left ankle: Secondary | ICD-10-CM | POA: Diagnosis not present

## 2018-06-07 ENCOUNTER — Other Ambulatory Visit: Payer: Self-pay

## 2018-06-07 ENCOUNTER — Ambulatory Visit (INDEPENDENT_AMBULATORY_CARE_PROVIDER_SITE_OTHER): Payer: 59 | Admitting: Family Medicine

## 2018-06-07 ENCOUNTER — Encounter: Payer: Self-pay | Admitting: Family Medicine

## 2018-06-07 DIAGNOSIS — L304 Erythema intertrigo: Secondary | ICD-10-CM

## 2018-06-07 DIAGNOSIS — M797 Fibromyalgia: Secondary | ICD-10-CM | POA: Diagnosis not present

## 2018-06-07 MED ORDER — CLOTRIMAZOLE-BETAMETHASONE 1-0.05 % EX CREA: 1.0000 "application " | TOPICAL_CREAM | Freq: Two times a day (BID) | CUTANEOUS | 0 refills | Status: DC

## 2018-06-07 MED ORDER — GABAPENTIN 100 MG PO CAPS: 100.0000 mg | ORAL_CAPSULE | Freq: Every day | ORAL | 0 refills | Status: DC

## 2018-06-07 NOTE — Progress Notes (Signed)
Name: Kathleen Conley   MRN: [id number]    DOB: 10-30-1970   Date:06/07/2018       Progress Note  Subjective  Chief Complaint  Chief Complaint  Patient presents with  . Rash  . Fibromyalgia    I connected with  Elvera Maria  on 06/07/18 at  8:40 AM EDT by a video enabled telemedicine application and verified that I am speaking with the correct person using two identifiers.  I discussed the limitations of evaluation and management by telemedicine and the availability of in person appointments. The patient expressed understanding and agreed to proceed. Staff also discussed with the patient that there may be a patient responsible charge related to this service. Patient Location: at home  Provider Location: [facility]   HPI  Rash: under both breasts , going on for the past two days, itchy and red. She never this type of rash today   FMS: she states pain under control at home , around a 4, however while at work she feels very tired, pain all over, and higher pain score. She states Lyrica caused nightmares, she does not recall trying gabapentin. She is under more stress because of COVID-19. She is willing to try Gabapentin. Discussed slow titration   Patient Active Problem List   Diagnosis Date Noted  . Morbid obesity (HCC) 04/17/2018  . 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 Flat foot 01/28/2016  . Aortic sclerosis 10/14/2015  . Midline low back pain without sciatica 09/20/2015  . Leukocytosis 11/29/2014  . Osteoarthritis of left knee 11/20/2014  . History of ventral hernia repair 10/27/2014  . Allergic rhinitis 08/17/2014  . Axillary hidradenitis suppurativa 08/17/2014  . Baker cyst 08/17/2014  . Chronic constipation 08/17/2014  . 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 Engages in travel abroad 08/17/2014  . Fibromyalgia 08/17/2014  . Cardiac murmur 08/17/2014  . Dysmetabolic syndrome 08/17/2014  . Plantar fasciitis 08/17/2014   . Beat, premature ventricular 08/17/2014  . 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 Obstructive apnea 12/02/2013  . Essential (primary) hypertension 12/09/2009  . Hypertrichosis 12/09/2009    Past Surgical History:  Procedure Laterality Date  . FOOT ARTHRODESIS Right 12/01/2016   Procedure: ARTHRODESIS FOOT; TRIPLE;  Surgeon: [name], DPM;  Location: [facility] ORS;  Service: Podiatry;  Laterality: Right;  . HERNIA REPAIR  10/09/11   Ventral Incarcerated- Dr. [name]  . OOPHORECTOMY Left 12/09/2009  . TUBAL LIGATION  1993  . VENTRAL HERNIA REPAIR  10/09/2011    Family History  Problem Relation Age of Onset  . Hypertension Mother   . CVA Mother   . Kidney disease Mother   . Congenital heart disease Mother   . Heart disease Father   . Hypertension Father   . Diabetes Father   . Breast cancer Paternal Grandmother        Bilateral  . Colon cancer Maternal Grandfather   . Colon cancer Maternal Uncle     Social History   Socioeconomic History  . Marital status: Married    Spouse name: Not on file  . Number of children: 3  . Years of education: Associates  . Highest education level: Associate degree: academic program  Occupational History  . 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 Financial resource strain: Not hard at all  . Food insecurity:    Worry: Never true    Inability: Never true  . Transportation needs:  Medical: No    Non-medical: No  Tobacco Use  . Smoking status: Never Smoker  . Smokeless tobacco: Never Used  Substance and Sexual Activity  . Alcohol use: No    Alcohol/week: 0.0 standard drinks  . Drug use: No  . Sexual activity: Yes    Partners: Male    Birth control/protection: I.U.D.  Lifestyle  . Physical activity:    Days per week: 0 days    Minutes per session: Not on file  . Stress: Not at all  Relationships  . Social connections:    Talks on phone: More than three times a  week    Gets together: Never    Attends religious service: More than 4 times per year    Active member of club or organization: No    Attends meetings of clubs or organizations: Never    Relationship status: Never married  . Intimate partner violence:    Fear of current or ex partner: No    Emotionally abused: No    Physically abused: No    Forced sexual activity: No  Other Topics Concern  . Not on file  Social History Narrative   Mar[place] Moses in 2016 in [place] and he finally got visa to move to [place] Nov 2019     Current Outpatient Medications:  .  acetaminophen (TYLENOL) 500 MG tablet, Take 1 tablet (500 mg total) by mouth every 6 (six) hours as needed. Max of 3 grams daily or 6 pills dialy (Patient taking differently: Take 1,000 mg by mouth every 6 (six) hours as needed for moderate pain or headache. ), Disp: 30 tablet, Rfl: 0 .  Armodafinil (NUVIGIL) 150 MG tablet, Take 1 tablet (150 mg total) by mouth daily., Disp: 30 tablet, Rfl: 2 .  aspirin EC 81 MG tablet, Take 1 tablet (81 mg total) by mouth daily., Disp: 30 tablet, Rfl: 0 .  azelastine (ASTELIN) 0.1 % nasal spray, Place 2 sprays into both nostrils 2 (two) times daily. Use in each nostril as directed, Disp: 30 mL, Rfl: 0 .  buPROPion (WELLBUTRIN XL) 150 MG 24 hr tablet, Take 1 tablet (150 mg total) by mouth daily., Disp: 90 tablet, Rfl: 0 .  Cetirizine HCl (ZYRTEC ALLERGY) 10 MG CAPS, Take 1 capsule by mouth as needed., Disp: , Rfl:  .  Cholecalciferol (VITAMIN D) 2000 UNITS tablet, Take 2,000 Units by mouth daily. , Disp: , Rfl:  .  clotrimazole-betamethasone (LOTRISONE) cream, Apply 1 application topically 2 (two) times daily., Disp: 90 g, Rfl: 0 .  docusate sodium (COLACE) 100 MG capsule, Take 100 mg by mouth daily as needed for moderate constipation. , Disp: , Rfl:  .  DULoxetine (CYMBALTA) 60 MG capsule, Take 1 capsule (60 mg total) by mouth daily., Disp: 90 capsule, Rfl: 0 .  ferrous sulfate 325 (65 FE) MG tablet, Take 325  mg by mouth daily. , Disp: , Rfl:  .  fluticasone (FLONASE) 50 MCG/ACT nasal spray, Place 2 sprays into both nostrils daily as needed for allergies., Disp: 16 g, Rfl: 1 .  gabapentin (NEURONTIN) 100 MG capsule, Take 1-3 capsules (100-300 mg total) by mouth at bedtime. Start with 1 cap and increase by one cap every 3 days, max of 300 mg, Disp: 90 capsule, Rfl: 0 .  hydrOXYzine (ATARAX/VISTARIL) 10 MG tablet, Take 1 tablet (10 mg total) by mouth 3 (three) times daily as needed., Disp: 30 tablet, Rfl: 0 .  levonorgestrel (MIRENA, 52 MG,) 20 MCG/24HR IUD, 1 each by  Intrauterine route once. , Disp: , Rfl:  .  metaxalone (SKELAXIN) 800 MG tablet, Take 1 tablet (800 mg total) by mouth 3 (three) times daily as needed for muscle spasms., Disp: 90 tablet, Rfl: 1 .  Multiple Vitamins-Minerals (MULTIVITAMIN PO), Take 1 tablet by mouth daily., Disp: , Rfl:  .  olmesartan (BENICAR) 40 MG tablet, Take 1 tablet (40 mg total) by mouth daily., Disp: 90 tablet, Rfl: 1 .  TRAVATAN Z 0.004 % SOLN ophthalmic solution, Place 1 drop into both eyes at bedtime., Disp: , Rfl: 3 .  traZODone (DESYREL) 50 MG tablet, Take 0.5-1 tablets (25-50 mg total) by mouth at bedtime as needed for sleep., Disp: 30 tablet, Rfl: 2  Allergies  Allergen Reactions  . Lisinopril Cough       . Norvasc [Amlodipine Besylate]     I personally reviewed active problem list, medication list, allergies, family history with the patient/caregiver today.   ROS  Ten systems reviewed and is negative except as mentioned in HPI   Objective  Virtual encounter, vitals not obtained.  Physical Exam   Awake, alert, sitting in her living room, no distress Rash looks erythematous, bumpy with satellite lesions , she showed me under right breast only   PHQ2/9: Depression screen [facility] 2/9 06/07/2018 04/17/2018 03/14/2018 01/11/2018 12/13/2017  Decreased Interest 0 0 0 1 0  Down, Depressed, Hopeless 0 1 0 1 1  PHQ - 2 Score 0 1 0 2 1  Altered sleeping 1  1 0 1 2  Tired, decreased energy 3 1 1 1  0  Change in appetite 0 0 1 1 1   Feeling bad or failure about yourself  0 1 0 1 0  Trouble concentrating 0 0 0 1 0  Moving slowly or fidgety/restless 0 0 0 0 0  Suicidal thoughts 0 0 0 0 0  PHQ-9 Score 4 4 2 7 4   Difficult doing work/chores Not difficult at all Somewhat difficult Not difficult at all Not difficult at all Not difficult at all  Some recent data might be hidden   PHQ-2/9 Result is positive.  In remission now   Fall Risk: Fall Risk  06/07/2018 04/17/2018 03/14/2018 01/11/2018 12/13/2017  Falls in the past year? 0 0 0 0 No  Number falls in past yr: 0 - - 0 -  Injury with Fall? 0 - - 0 -     Assessment & Plan  1. Intertrigo  - clotrimazole-betamethasone (LOTRISONE) cream; Apply 1 application topically 2 (two) times daily.  Dispense: 90 g; Refill: 0  2. Fibromyalgia  - gabapentin (NEURONTIN) 100 MG capsule; Take 1-3 capsules (100-300 mg total) by mouth at bedtime. Start with 1 cap and increase by one cap every 3 days, max of 300 mg  Dispense: 90 capsule; Refill: 0  I discussed the assessment and treatment plan with the patient. The patient was provided an opportunity to ask questions and all were answered. The patient agreed with the plan and demonstrated an understanding of the instructions.  The patient was advised to call back or seek an in-person evaluation if the symptoms worsen or if the condition fails to improve as anticipated.  I provided 16  minutes of non-face-to-face time during this encounter.

## 2018-07-02 DIAGNOSIS — M19071 Primary osteoarthritis, right ankle and foot: Secondary | ICD-10-CM | POA: Diagnosis not present

## 2018-07-02 DIAGNOSIS — M76822 Posterior tibial tendinitis, left leg: Secondary | ICD-10-CM | POA: Diagnosis not present

## 2018-07-02 DIAGNOSIS — M25572 Pain in left ankle and joints of left foot: Secondary | ICD-10-CM | POA: Diagnosis not present

## 2018-07-17 ENCOUNTER — Ambulatory Visit (INDEPENDENT_AMBULATORY_CARE_PROVIDER_SITE_OTHER): Payer: 59 | Admitting: Family Medicine

## 2018-07-17 ENCOUNTER — Other Ambulatory Visit (HOSPITAL_COMMUNITY)
Admission: RE | Admit: 2018-07-17 | Discharge: 2018-07-17 | Disposition: A | Discharge status: Home / Self Care | Payer: 59 | Source: Ambulatory Visit | Attending: Family Medicine | Admitting: Family Medicine

## 2018-07-17 ENCOUNTER — Encounter: Payer: Self-pay | Admitting: Family Medicine

## 2018-07-17 ENCOUNTER — Other Ambulatory Visit: Payer: Self-pay

## 2018-07-17 VITALS — BP 138/78 | HR 98 | Temp 98.7°F | Resp 14 | Ht 61.0 in | Wt 237.7 lb

## 2018-07-17 DIAGNOSIS — F411 Generalized anxiety disorder: Secondary | ICD-10-CM | POA: Diagnosis not present

## 2018-07-17 DIAGNOSIS — N949 Unspecified condition associated with female genital organs and menstrual cycle: Secondary | ICD-10-CM | POA: Insufficient documentation

## 2018-07-17 DIAGNOSIS — G4733 Obstructive sleep apnea (adult) (pediatric): Secondary | ICD-10-CM | POA: Diagnosis not present

## 2018-07-17 DIAGNOSIS — N9089 Other specified noninflammatory disorders of vulva and perineum: Secondary | ICD-10-CM

## 2018-07-17 DIAGNOSIS — M797 Fibromyalgia: Secondary | ICD-10-CM | POA: Diagnosis not present

## 2018-07-17 DIAGNOSIS — G4726 Circadian rhythm sleep disorder, shift work type: Secondary | ICD-10-CM | POA: Diagnosis not present

## 2018-07-17 DIAGNOSIS — F330 Major depressive disorder, recurrent, mild: Secondary | ICD-10-CM

## 2018-07-17 MED ORDER — HYDROXYZINE HCL 10 MG PO TABS: 10.0000 mg | ORAL_TABLET | Freq: Three times a day (TID) | ORAL | 0 refills | Status: DC | PRN

## 2018-07-17 MED ORDER — BUPROPION HCL ER (XL) 150 MG PO TB24: 150.0000 mg | ORAL_TABLET | Freq: Every day | ORAL | 0 refills | Status: DC

## 2018-07-17 MED ORDER — ARMODAFINIL 150 MG PO TABS: 150.0000 mg | ORAL_TABLET | Freq: Every day | ORAL | 2 refills | Status: DC

## 2018-07-17 MED ORDER — DULOXETINE HCL 60 MG PO CPEP: 60.0000 mg | ORAL_CAPSULE | Freq: Every day | ORAL | 0 refills | Status: DC

## 2018-07-17 MED ORDER — FLUCONAZOLE 150 MG PO TABS: 150.0000 mg | ORAL_TABLET | ORAL | 0 refills | Status: DC

## 2018-07-17 NOTE — Progress Notes (Signed)
Name: Kathleen Conley   MRN: [id number]    DOB: 1970/04/09   Date:07/17/2018       Progress Note  Subjective  Chief Complaint  Chief Complaint  Patient presents with  . Follow-up  . Hypertension  . Diabetes  . Insomnia  . Vaginal Pain    onset several months, patient states no discharge or itching just irritaion and pin prick feeling  . Medication Problem    stopped gabapentin states it was making her tired and chew her tongue    HPI  Vulva irritation: she states it is on the outside, feels irritated, sometimes it gets red but not at this time, sometimes a prickly finger, she used monistat about one month ago and symptoms improved. No vaginal discharge   Major depression:she is doing well, but worried about seasonal affective disorder, she is back on Wellbutrin since Fall and she states she likes the way it gives her energy and does not want to stop at this time. Currently doing much better, adjusting to being married.   Metabolic Syndrome and Obesity: we tried giving her Trulicity but needs step therapy, she did not like  Metformin - caused diarrhea . She denies polyphagia, polyuria or polydipsia.She is only on life style modification   Obesity: she has a long history of obesity, started after her divorce when she was in her 45's. She tried weight watchers but was unsuccessful.She is a stress eater, she is now working 2nd shift instead of 3rd, but goes home and watches TV until late and sleeps in the am.Husband moved in Nov 2019 and she has been trying to go to sleep by midnight, not eating as much at night. She has been eating more rice since husband moved in.   FMS: she states pain is better with Cymbalta, could not tolerate Lyrica ( caused nightmares and vivid dreams) also stopped Gabapentin ( she was grinding her teeth) , pain level on Cymbalta is0/10,she takes skelaxin about once a dayfor leg cramps  OSA: she has mild symptoms, did not qualify for CPAP , avoiding  sleeping on her back,  sleeping on her side, she is not waking up with headaches. She also has shift work sleep disorder and states nuvigil helps her stay awake.  HTN:bp is finally at goal, she is back on Benicar 40, no cough, she tried maxzide and got dizzy, norvasc caused swelling, on Tylenol instead of ibuprofen. No chest pain or palpitation   Insomnia: shestates no longer having problems sleeping, stopped trazodone   Leucocytosis and anemia: she has seen hematologist in the past and does not want to go back at this time. She has hidradenitis suppurative . Unchanged    Patient Active Problem List   Diagnosis Date Noted  . Morbid obesity (HCC) 04/17/2018  . 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 Flat foot 01/28/2016  . Aortic sclerosis 10/14/2015  . Midline low back pain without sciatica 09/20/2015  . Leukocytosis 11/29/2014  . Osteoarthritis of left knee 11/20/2014  . History of ventral hernia repair 10/27/2014  . Allergic rhinitis 08/17/2014  . Axillary hidradenitis suppurativa 08/17/2014  . Baker cyst 08/17/2014  . Chronic constipation 08/17/2014  . 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 Engages in travel abroad 08/17/2014  . Fibromyalgia 08/17/2014  . Cardiac murmur 08/17/2014  . Dysmetabolic syndrome 08/17/2014  . Plantar fasciitis 08/17/2014  . Beat, premature ventricular 08/17/2014  . 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 Obstructive apnea 12/02/2013  .  Essential (primary) hypertension 12/09/2009  . Hypertrichosis 12/09/2009    Past Surgical History:  Procedure Laterality Date  . FOOT ARTHRODESIS Right 12/01/2016   Procedure: ARTHRODESIS FOOT; TRIPLE;  Surgeon: Gwyneth RevelsFowler, Justin, DPM;  Location: [facility] ORS;  Service: Podiatry;  Laterality: Right;  . HERNIA REPAIR  10/09/11   Ventral Incarcerated- [name]  . OOPHORECTOMY Left 12/09/2009  . TUBAL LIGATION  1993  . VENTRAL HERNIA REPAIR   10/09/2011    Family History  Problem Relation Age of Onset  . Hypertension Mother   . CVA Mother   . Kidney disease Mother   . Congenital heart disease Mother   . Heart disease Father   . Hypertension Father   . Diabetes Father   . Breast cancer Paternal Grandmother        Bilateral  . Colon cancer Maternal Grandfather   . Colon cancer Maternal Uncle     Social History   Socioeconomic History  . Marital status: Married    Spouse name: Not on file  . Number of children: 3  . Years of education: Associates  . Highest education level: Associate degree: academic program  Occupational History  . 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 Financial resource strain: Not hard at all  . Food insecurity:    Worry: Never true    Inability: Never true  . Transportation needs:    Medical: No    Non-medical: No  Tobacco Use  . Smoking status: Never Smoker  . Smokeless tobacco: Never Used  Substance and Sexual Activity  . Alcohol use: No    Alcohol/week: 0.0 standard drinks  . Drug use: No  . Sexual activity: Yes    Partners: Male    Birth control/protection: I.U.D.  Lifestyle  . Physical activity:    Days per week: 0 days    Minutes per session: Not on file  . Stress: Not at all  Relationships  . Social connections:    Talks on phone: More than three times a week    Gets together: Never    Attends religious service: More than 4 times per year    Active member of club or organization: No    Attends meetings of clubs or organizations: Never    Relationship status: Never married  . Intimate partner violence:    Fear of current or ex partner: No    Emotionally abused: No    Physically abused: No    Forced sexual activity: No  Other Topics Concern  . Not on file  Social History Narrative   Married Moses in 2016 in [place] and he finally got visa to move to [place] Nov 2019     Current Outpatient Medications:  .  acetaminophen (TYLENOL) 500 MG tablet,  Take 1 tablet (500 mg total) by mouth every 6 (six) hours as needed. Max of 3 grams daily or 6 pills dialy (Patient taking differently: Take 1,000 mg by mouth every 6 (six) hours as needed for moderate pain or headache. ), Disp: 30 tablet, Rfl: 0 .  Armodafinil (NUVIGIL) 150 MG tablet, Take 1 tablet (150 mg total) by mouth daily., Disp: 30 tablet, Rfl: 2 .  aspirin EC 81 MG tablet, Take 1 tablet (81 mg total) by mouth daily., Disp: 30 tablet, Rfl: 0 .  azelastine (ASTELIN) 0.1 % nasal spray, Place 2 sprays into both nostrils 2 (two) times daily. Use in each nostril as directed, Disp: 30 mL, Rfl: 0 .  buPROPion (  WELLBUTRIN XL) 150 MG 24 hr tablet, Take 1 tablet (150 mg total) by mouth daily., Disp: 90 tablet, Rfl: 0 .  Cetirizine HCl (ZYRTEC ALLERGY) 10 MG CAPS, Take 1 capsule by mouth as needed., Disp: , Rfl:  .  Cholecalciferol (VITAMIN D) 2000 UNITS tablet, Take 2,000 Units by mouth daily. , Disp: , Rfl:  .  clotrimazole-betamethasone (LOTRISONE) cream, Apply 1 application topically 2 (two) times daily., Disp: 90 g, Rfl: 0 .  docusate sodium (COLACE) 100 MG capsule, Take 100 mg by mouth daily as needed for moderate constipation. , Disp: , Rfl:  .  DULoxetine (CYMBALTA) 60 MG capsule, Take 1 capsule (60 mg total) by mouth daily., Disp: 90 capsule, Rfl: 0 .  ferrous sulfate 325 (65 FE) MG tablet, Take 325 mg by mouth daily. , Disp: , Rfl:  .  fluticasone (FLONASE) 50 MCG/ACT nasal spray, Place 2 sprays into both nostrils daily as needed for allergies., Disp: 16 g, Rfl: 1 .  hydrOXYzine (ATARAX/VISTARIL) 10 MG tablet, Take 1 tablet (10 mg total) by mouth 3 (three) times daily as needed., Disp: 30 tablet, Rfl: 0 .  levonorgestrel (MIRENA, 52 MG,) 20 MCG/24HR IUD, 1 each by Intrauterine route once. , Disp: , Rfl:  .  metaxalone (SKELAXIN) 800 MG tablet, Take 1 tablet (800 mg total) by mouth 3 (three) times daily as needed for muscle spasms., Disp: 90 tablet, Rfl: 1 .  Multiple Vitamins-Minerals  (MULTIVITAMIN PO), Take 1 tablet by mouth daily., Disp: , Rfl:  .  olmesartan (BENICAR) 40 MG tablet, Take 1 tablet (40 mg total) by mouth daily., Disp: 90 tablet, Rfl: 1 .  TRAVATAN Z 0.004 % SOLN ophthalmic solution, Place 1 drop into both eyes at bedtime., Disp: , Rfl: 3 .  traZODone (DESYREL) 50 MG tablet, Take 0.5-1 tablets (25-50 mg total) by mouth at bedtime as needed for sleep., Disp: 30 tablet, Rfl: 2 .  gabapentin (NEURONTIN) 100 MG capsule, Take 1-3 capsules (100-300 mg total) by mouth at bedtime. Start with 1 cap and increase by one cap every 3 days, max of 300 mg (Patient not taking: Reported on 07/17/2018), Disp: 90 capsule, Rfl: 0  Allergies  Allergen Reactions  . Lisinopril Cough       . Norvasc [Amlodipine Besylate]     I personally reviewed active problem list, medication list, allergies, family history, social history with the patient/caregiver today.   ROS  Constitutional: Negative for fever or weight change.  Respiratory: Negative for cough and shortness of breath.   Cardiovascular: Negative for chest pain or palpitations.  Gastrointestinal: Negative for abdominal pain, no bowel changes.  Musculoskeletal: Positive for intermittent gait problem secondary to left ankle pain  Skin: Negative for rash.  Neurological: Negative for dizziness or headache.  No other specific complaints in a complete review of systems (except as listed in HPI above).  Objective  Vitals:   07/17/18 1058  BP: 138/78  Pulse: 98  Resp: 14  Temp: 98.7 F (37.1 C)  TempSrc: Oral  SpO2: 94%  Weight: 237 lb 11.2 oz (107.8 kg)  Height: 5\' 1"  (1.549 m)    Body mass index is 44.91 kg/m.  Physical Exam  Constitutional: Patient appears well-developed and well-nourished. Obese No distress.  HEENT: head atraumatic, normocephalic, strabismus, pupils equal and reactive to light,  neck supple, wearing a surgical mask  Cardiovascular: Normal rate, regular rhythm and normal heart sounds.  No  murmur heard. No BLE edema. Pulmonary/Chest: Effort normal and breath sounds normal. No  respiratory distress. Abdominal: Soft.  There is no tenderness. Psychiatric: Patient has a normal mood and affect. behavior is normal. Judgment and thought content normal.  PHQ2/9: Depression screen [facility] 2/9 07/17/2018 06/07/2018 04/17/2018 03/14/2018 01/11/2018  Decreased Interest 0 0 0 0 1  Down, Depressed, Hopeless 0 0 1 0 1  PHQ - 2 Score 0 0 1 0 2  Altered sleeping 0 1 1 0 1  Tired, decreased energy 0 Change in appetite 0 0 0 1 1  Feeling bad or failure about yourself  0 0 1 0 1  Trouble concentrating 0 0 0 0 1  Moving slowly or fidgety/restless 0 0 0 0 0  Suicidal thoughts 0 0 0 0 0  PHQ-9 Score 0 Difficult doing work/chores Not difficult at all Not difficult at all Somewhat difficult Not difficult at all Not difficult at all  Some recent data might be hidden    phq 9 is negative   Fall Risk: Fall Risk  07/17/2018 06/07/2018 04/17/2018 03/14/2018 01/11/2018  Falls in the past year? 0 0 0 0 0  Number falls in past yr: 0 0 - - 0  Injury with Fall? 0 0 - - 0    Functional Status Survey: Is the patient deaf or have difficulty hearing?: No Does the patient have difficulty seeing, even when wearing glasses/contacts?: No Does the patient have difficulty concentrating, remembering, or making decisions?: No Does the patient have difficulty walking or climbing stairs?: No Does the patient have difficulty dressing or bathing?: No Does the patient have difficulty doing errands alone such as visiting a doctor's office or shopping?: No    Assessment & Plan  1. Vaginal discomfort  - Cervicovaginal ancillary only  2. Depression, major, recurrent, mild (HCC)  - DULoxetine (CYMBALTA) 60 MG capsule; Take 1 capsule (60 mg total) by mouth daily.  Dispense: 90 capsule; Refill: 0 - buPROPion (WELLBUTRIN XL) 150 MG 24 hr tablet; Take 1 tablet (150 mg total) by mouth daily.  Dispense: 90  tablet; Refill: 0  3. Fibromyalgia  - DULoxetine (CYMBALTA) 60 MG capsule; Take 1 capsule (60 mg total) by mouth daily.  Dispense: 90 capsule; Refill: 0  4. GAD (generalized anxiety disorder)  - hydrOXYzine (ATARAX/VISTARIL) 10 MG tablet; Take 1 tablet (10 mg total) by mouth 3 (three) times daily as needed.  Dispense: 30 tablet; Refill: 0  5. Vulvar irritation  - fluconazole (DIFLUCAN) 150 MG tablet; Take 1 tablet (150 mg total) by mouth every other day.  Dispense: 3 tablet; Refill: 0  6. Obstructive apnea  - Armodafinil (NUVIGIL) 150 MG tablet; Take 1 tablet (150 mg total) by mouth daily.  Dispense: 30 tablet; Refill: 2  7. Shift work sleep disorder  - Armodafinil (NUVIGIL) 150 MG tablet; Take 1 tablet (150 mg total) by mouth daily.  Dispense: 30 tablet; Refill: 2

## 2018-07-18 LAB — CERVICOVAGINAL ANCILLARY ONLY
Bacterial vaginitis: POSITIVE — AB
Candida vaginitis: POSITIVE — AB
Chlamydia: NEGATIVE
Neisseria Gonorrhea: NEGATIVE
Trichomonas: NEGATIVE

## 2018-08-07 ENCOUNTER — Encounter: Payer: Self-pay | Admitting: Family Medicine

## 2018-08-07 ENCOUNTER — Ambulatory Visit: Payer: 59 | Admitting: Family Medicine

## 2018-08-07 ENCOUNTER — Other Ambulatory Visit: Payer: Self-pay

## 2018-08-07 VITALS — BP 148/86 | HR 100 | Temp 99.5°F | Resp 18 | Ht 61.0 in | Wt 240.3 lb

## 2018-08-07 DIAGNOSIS — I10 Essential (primary) hypertension: Secondary | ICD-10-CM | POA: Diagnosis not present

## 2018-08-07 DIAGNOSIS — L02412 Cutaneous abscess of left axilla: Secondary | ICD-10-CM | POA: Diagnosis not present

## 2018-08-07 MED ORDER — SULFAMETHOXAZOLE-TRIMETHOPRIM 800-160 MG PO TABS: 1.0000 | ORAL_TABLET | Freq: Two times a day (BID) | ORAL | 0 refills | Status: DC

## 2018-08-07 NOTE — Progress Notes (Signed)
Name: Kathleen Conley   MRN: [id number]    DOB: 07/10/1970   Date:08/07/2018       Progress Note  Subjective  Chief Complaint  Chief Complaint  Patient presents with  . Recurrent Skin Infections    under left arm onset- monday stinging sensation and burning-has been keeping it clean and covered.    HPI  HTN: she did not take her medication this am and is in pain with abscess left axilla.   Abscess: she has a history of hidradenitis suppurativa but this time she noticed a larger area of pain and inflammation , she had to move this past weekend and has been picking up boxes and moving things around. She states it has a lot of pressure. No fever at home.    Patient Active Problem List   Diagnosis Date Noted  . Morbid obesity (HCC) 04/17/2018  . 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 Flat foot 01/28/2016  . Aortic sclerosis 10/14/2015  . Midline low back pain without sciatica 09/20/2015  . Leukocytosis 11/29/2014  . Osteoarthritis of left knee 11/20/2014  . History of ventral hernia repair 10/27/2014  . Allergic rhinitis 08/17/2014  . Axillary hidradenitis suppurativa 08/17/2014  . Baker cyst 08/17/2014  . Chronic constipation 08/17/2014  . 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 Engages in travel abroad 08/17/2014  . Fibromyalgia 08/17/2014  . Cardiac murmur 08/17/2014  . Dysmetabolic syndrome 08/17/2014  . Plantar fasciitis 08/17/2014  . Beat, premature ventricular 08/17/2014  . 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 Obstructive apnea 12/02/2013  . Essential (primary) hypertension 12/09/2009  . Hypertrichosis 12/09/2009    Past Surgical History:  Procedure Laterality Date  . FOOT ARTHRODESIS Right 12/01/2016   Procedure: ARTHRODESIS FOOT; TRIPLE;  Surgeon: Gwyneth RevelsFowler, Justin, DPM;  Location: [facility] ORS;  Service: Podiatry;  Laterality: Right;  . HERNIA REPAIR  10/09/11   [facility] Incarcerated- [name]   . OOPHORECTOMY Left 12/09/2009  . TUBAL LIGATION  1993  . VENTRAL HERNIA REPAIR  10/09/2011    Family History  Problem Relation Age of Onset  . Hypertension Mother   . CVA Mother   . Kidney disease Mother   . Congenital heart disease Mother   . Heart disease Father   . Hypertension Father   . Diabetes Father   . Breast cancer Paternal Grandmother        Bilateral  . Colon cancer Maternal Grandfather   . Colon cancer Maternal Uncle     Social History   Socioeconomic History  . Marital status: Married    Spouse name: Not on file  . Number of children: 3  . Years of education: Associates  . Highest education level: Associate degree: academic program  Occupational History  . Occupation: LPN at the [employer]  Social Needs  . Financial resource strain: Not hard at all  . Food insecurity    Worry: Never true    Inability: Never true  . Transportation needs    Medical: No    Non-medical: No  Tobacco Use  . Smoking status: Never Smoker  . Smokeless tobacco: Never Used  Substance and Sexual Activity  . Alcohol use: No    Alcohol/week: 0.0 standard drinks  . Drug use: No  . Sexual activity: Yes    Partners: Male    Birth control/protection: I.U.D.  Lifestyle  . Physical activity    Days per week: 0 days    Minutes per session:  Not on file  . Stress: Not at all  Relationships  . Social connections    Talks on phone: More than three times a week    Gets together: Never    Attends religious service: More than 4 times per year    Active member of club or organization: No    Attends meetings of clubs or organizations: Never    Relationship status: Never married  . Intimate partner violence    Fear of current or ex partner: No    Emotionally abused: No    Physically abused: No    Forced sexual activity: No  Other Topics Concern  . Not on file  Social History Narrative   Married Moses in 2016 in [place] and he finally got visa to move to [place] Nov 2019      Current Outpatient Medications:  .  acetaminophen (TYLENOL) 500 MG tablet, Take 1 tablet (500 mg total) by mouth every 6 (six) hours as needed. Max of 3 grams daily or 6 pills dialy (Patient taking differently: Take 1,000 mg by mouth every 6 (six) hours as needed for moderate pain or headache. ), Disp: 30 tablet, Rfl: 0 .  Armodafinil (NUVIGIL) 150 MG tablet, Take 1 tablet (150 mg total) by mouth daily., Disp: 30 tablet, Rfl: 2 .  aspirin EC 81 MG tablet, Take 1 tablet (81 mg total) by mouth daily., Disp: 30 tablet, Rfl: 0 .  azelastine (ASTELIN) 0.1 % nasal spray, Place 2 sprays into both nostrils 2 (two) times daily. Use in each nostril as directed, Disp: 30 mL, Rfl: 0 .  buPROPion (WELLBUTRIN XL) 150 MG 24 hr tablet, Take 1 tablet (150 mg total) by mouth daily., Disp: 90 tablet, Rfl: 0 .  Cetirizine HCl (ZYRTEC ALLERGY) 10 MG CAPS, Take 1 capsule by mouth as needed., Disp: , Rfl:  .  Cholecalciferol (VITAMIN D) 2000 UNITS tablet, Take 2,000 Units by mouth daily. , Disp: , Rfl:  .  clotrimazole-betamethasone (LOTRISONE) cream, Apply 1 application topically 2 (two) times daily., Disp: 90 g, Rfl: 0 .  docusate sodium (COLACE) 100 MG capsule, Take 100 mg by mouth daily as needed for moderate constipation. , Disp: , Rfl:  .  DULoxetine (CYMBALTA) 60 MG capsule, Take 1 capsule (60 mg total) by mouth daily., Disp: 90 capsule, Rfl: 0 .  ferrous sulfate 325 (65 FE) MG tablet, Take 325 mg by mouth daily. , Disp: , Rfl:  .  fluconazole (DIFLUCAN) 150 MG tablet, Take 1 tablet (150 mg total) by mouth every other day., Disp: 3 tablet, Rfl: 0 .  fluticasone (FLONASE) 50 MCG/ACT nasal spray, Place 2 sprays into both nostrils daily as needed for allergies., Disp: 16 g, Rfl: 1 .  hydrOXYzine (ATARAX/VISTARIL) 10 MG tablet, Take 1 tablet (10 mg total) by mouth 3 (three) times daily as needed., Disp: 30 tablet, Rfl: 0 .  levonorgestrel (MIRENA, 52 MG,) 20 MCG/24HR IUD, 1 each by Intrauterine route once. ,  Disp: , Rfl:  .  metaxalone (SKELAXIN) 800 MG tablet, Take 1 tablet (800 mg total) by mouth 3 (three) times daily as needed for muscle spasms., Disp: 90 tablet, Rfl: 1 .  Multiple Vitamins-Minerals (MULTIVITAMIN PO), Take 1 tablet by mouth daily., Disp: , Rfl:  .  olmesartan (BENICAR) 40 MG tablet, Take 1 tablet (40 mg total) by mouth daily., Disp: 90 tablet, Rfl: 1 .  TRAVATAN Z 0.004 % SOLN ophthalmic solution, Place 1 drop into both eyes at bedtime., Disp: , Rfl: 3  Allergies  Allergen Reactions  . Lisinopril Cough       . Norvasc [Amlodipine Besylate]     I personally reviewed active problem list, medication list, allergies, family history, social history with the patient/caregiver today.   ROS  Ten systems reviewed and is negative except as mentioned in HPI   Objective  Vitals:   08/07/18 0920  BP: (!) 152/86  Pulse: 100  Resp: 18  Temp: 99.5 F (37.5 C)  TempSrc: Oral  SpO2: 95%  Weight: 240 lb 4.8 oz (109 kg)  Height: 5\' 1"  (1.549 m)    Body mass index is 45.4 kg/m.  Physical Exam  Constitutional: Patient appears well-developed and well-nourished. Obese  No distress.  HEENT: head atraumatic, normocephalic, pupils equal and reactive to light, strabismus,  neck supple Cardiovascular: Normal rate, regular rhythm and normal heart sounds.  No murmur heard. No BLE edema. Pulmonary/Chest: Effort normal and breath sounds normal. No respiratory distress. Abdominal: Soft.  There is no tenderness. Skin: right axilla with some scarring, fluctuant abscess noticed, tender to touch . During I&D large amount of drainage purulent with some dark material   Psychiatric: Patient has a normal mood and affect. behavior is normal. Judgment and thought content normal.  Recent Results (from the past 2160 hour(s))  Cervicovaginal ancillary only     Status: Abnormal   Collection Time: 07/17/18 12:00 AM  Result Value Ref Range   Bacterial vaginitis **POSITIVE for Gardnerella  vaginalis** (A)     Comment: Normal Reference Range - Negative   Candida vaginitis **POSITIVE for Candida species** (A)     Comment: Normal Reference Range - Negative   Chlamydia Negative     Comment: Normal Reference Range - Negative   Neisseria gonorrhea Negative     Comment: Normal Reference Range - Negative   Trichomonas Negative     Comment: Normal Reference Range - Negative      PHQ2/9: [facility] screen [facility] 07/17/2018 06/07/2018 04/17/2018 03/14/2018 01/11/2018  Decreased Interest 0 0 0 0 1  Down, Depressed, Hopeless 0 0 1 0 1  PHQ - 2 Score 0 0 1 0 2  Altered sleeping 0 1 1 0 1  Tired, decreased energy 0 3 1 1 1   Change in appetite 0 0 0 1 1  Feeling bad or failure about yourself  0 0 1 0 1  Trouble concentrating 0 0 0 0 1  Moving slowly or fidgety/restless 0 0 0 0 0  Suicidal thoughts 0 0 0 0 0  PHQ-9 Score 0 4 4 2 7   Difficult doing work/chores Not difficult at all Not difficult at all Somewhat difficult Not difficult at all Not difficult at all  Some recent data might be hidden    phq 9 is negative   Fall Risk: Fall Risk  07/17/2018 06/07/2018 04/17/2018 03/14/2018 01/11/2018  Falls in the past year? 0 0 0 0 0  Number falls in past yr: 0 0 - - 0  Injury with Fall? 0 0 - - 0     Assessment & Plan  1. Abscess of left axilla  - Wound culture - sulfamethoxazole-trimethoprim (BACTRIM DS) 800-160 MG tablet; Take 1 tablet by mouth 2 (two) times daily.  Dispense: 14 tablet; Refill: 0  Consent signed: YES  Procedure: Incision and drainage of abscess Location: left axilla  Equipment used: sterile scalpel, #10 blade Anesthesia:  None  Cleaned and prepped: Alcohol   After consent signed, affected area of skin prepped with betadine.Located greatest area  of fluctuance of abscess. 1cm incision with #10 blade made over abscess. Purulent material expressed, abscess cavity and loculations debrided.    Covered area with sterile guaze and taped. Instructed on dressing  changes, materials given, pain medications prescribed. F/U for nursing visit for packing change if needed.  2. Essential (primary) hypertension  Explained importance of taking bp medication daily at the same time

## 2018-08-10 LAB — WOUND CULTURE
MICRO NUMBER:: 580583
RESULT:: NO GROWTH
SPECIMEN QUALITY:: ADEQUATE

## 2018-08-13 ENCOUNTER — Ambulatory Visit: Payer: 59 | Attending: Family Medicine

## 2018-09-26 DIAGNOSIS — M25572 Pain in left ankle and joints of left foot: Secondary | ICD-10-CM | POA: Diagnosis not present

## 2018-10-16 ENCOUNTER — Ambulatory Visit: Admission: RE | Admit: 2018-10-16 | Payer: 59 | Source: Ambulatory Visit

## 2018-10-16 ENCOUNTER — Other Ambulatory Visit: Payer: Self-pay | Admitting: Family Medicine

## 2018-10-16 DIAGNOSIS — N6320 Unspecified lump in the left breast, unspecified quadrant: Secondary | ICD-10-CM

## 2018-10-17 DIAGNOSIS — H5213 Myopia, bilateral: Secondary | ICD-10-CM | POA: Diagnosis not present

## 2018-10-18 ENCOUNTER — Other Ambulatory Visit: Payer: Self-pay

## 2018-10-18 ENCOUNTER — Ambulatory Visit: Payer: 59 | Admitting: Family Medicine

## 2018-10-18 ENCOUNTER — Encounter: Payer: Self-pay | Admitting: Family Medicine

## 2018-10-18 VITALS — BP 144/76 | HR 96 | Temp 97.3°F | Resp 18 | Ht 61.0 in | Wt 237.0 lb

## 2018-10-18 DIAGNOSIS — J302 Other seasonal allergic rhinitis: Secondary | ICD-10-CM

## 2018-10-18 DIAGNOSIS — E8881 Metabolic syndrome: Secondary | ICD-10-CM | POA: Diagnosis not present

## 2018-10-18 DIAGNOSIS — G4733 Obstructive sleep apnea (adult) (pediatric): Secondary | ICD-10-CM | POA: Diagnosis not present

## 2018-10-18 DIAGNOSIS — M797 Fibromyalgia: Secondary | ICD-10-CM | POA: Diagnosis not present

## 2018-10-18 DIAGNOSIS — F330 Major depressive disorder, recurrent, mild: Secondary | ICD-10-CM

## 2018-10-18 DIAGNOSIS — N9089 Other specified noninflammatory disorders of vulva and perineum: Secondary | ICD-10-CM | POA: Diagnosis not present

## 2018-10-18 DIAGNOSIS — Z8639 Personal history of other endocrine, nutritional and metabolic disease: Secondary | ICD-10-CM | POA: Diagnosis not present

## 2018-10-18 DIAGNOSIS — Z23 Encounter for immunization: Secondary | ICD-10-CM

## 2018-10-18 DIAGNOSIS — R739 Hyperglycemia, unspecified: Secondary | ICD-10-CM

## 2018-10-18 DIAGNOSIS — E785 Hyperlipidemia, unspecified: Secondary | ICD-10-CM

## 2018-10-18 DIAGNOSIS — I10 Essential (primary) hypertension: Secondary | ICD-10-CM | POA: Diagnosis not present

## 2018-10-18 DIAGNOSIS — G4726 Circadian rhythm sleep disorder, shift work type: Secondary | ICD-10-CM | POA: Diagnosis not present

## 2018-10-18 MED ORDER — FLUTICASONE PROPIONATE 50 MCG/ACT NA SUSP: 2.0000 | Freq: Every day | NASAL | 1 refills | Status: DC | PRN

## 2018-10-18 MED ORDER — ARMODAFINIL 150 MG PO TABS: 150.0000 mg | ORAL_TABLET | Freq: Every day | ORAL | 0 refills | Status: DC

## 2018-10-18 MED ORDER — METAXALONE 800 MG PO TABS: 800.0000 mg | ORAL_TABLET | Freq: Three times a day (TID) | ORAL | 1 refills | Status: DC | PRN

## 2018-10-18 MED ORDER — OLMESARTAN MEDOXOMIL 40 MG PO TABS: 40.0000 mg | ORAL_TABLET | Freq: Every day | ORAL | 1 refills | Status: DC

## 2018-10-18 MED ORDER — DULOXETINE HCL 60 MG PO CPEP: 60.0000 mg | ORAL_CAPSULE | Freq: Every day | ORAL | 1 refills | Status: DC

## 2018-10-18 MED ORDER — BUPROPION HCL ER (XL) 150 MG PO TB24: 150.0000 mg | ORAL_TABLET | Freq: Every day | ORAL | 1 refills | Status: DC

## 2018-10-18 MED ORDER — AZELASTINE HCL 0.1 % NA SOLN: 2.0000 | Freq: Two times a day (BID) | NASAL | 0 refills | Status: DC

## 2018-10-18 MED ORDER — FLUCONAZOLE 150 MG PO TABS: 150.0000 mg | ORAL_TABLET | ORAL | 0 refills | Status: DC

## 2018-10-18 NOTE — Progress Notes (Signed)
Name: Kathleen Conley   MRN: [id number]    DOB: Nov 05, 1970   Date:10/18/2018       Progress Note  Subjective  Chief Complaint  Chief Complaint  Patient presents with  . Medication Refill    3 month F/U-Lost her job of 18 years and just recently got a new job  . Hypertension    Denies any symptoms  . Depression  . GAD  . Sleep Apnea  . Fibromyalgia  . Dyslipidemia  . Obesity  . Insomnia    HPI  Major depression:she is doing well, but worried about seasonal affective disorder,she is back onWellbutrinsince Fall 2019, she was doing well, adjusting to married life, however her job at [employer] had to eliminate positions and she chose to go to another facility in [place]. [employer], she is happy with the new facility however sad taht she left a job that she had for 18 years, and adjusting to the transition. Phq 9 is high today, she thinks it is just a phase and she will get better   Metabolic Syndrome and Obesity: we tried giving her Trulicity but needs step therapy, shedid not likeMetformin - caused diarrhea. She denies polyphagia, polyuria or polydipsia.She is only on life style modification, Trulicity still not approved, she has been emotionally eating again, but will resume a healthier diet   Obesity: she has a long history of obesity, started after her divorce when she was in her 45's. She tried weight watchers but was unsuccessful.She is a stress eater, working now [employer] shift 7 pm to 7 am, weekends. She will try to do better with her diet , she stopped eating fast food .  FMS: she states pain is better with Cymbalta, could not tolerate Lyrica ( caused nightmares and vivid dreams) also stopped Gabapentin ( she was grinding her teeth) , pain level on Cymbalta is0/10,she takes skelaxin about once a dayfor leg cramps. Unchanged   OSA: she has mild symptoms, did not qualify for CPAP ,avoiding sleeping on her back,sleeping on her side, she is not waking up with  headaches. She also has shift work sleep disorder and states nuvigil helps her stay awake.Unchanged   HTN:bp is slightly elevated today, but a little more stressed, continue current medication and continue to monitor. No chest pain or palpitation at this time  Insomnia: she is doing well at this time  Leucocytosis and anemia: she has seen hematologist in the past and does not want to go back at this time.She has hidradenitis suppurative . We will recheck labs.    Patient Active Problem List   Diagnosis Date Noted  . Morbid obesity (HCC) 04/17/2018  . 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 Flat foot 01/28/2016  . Aortic sclerosis 10/14/2015  . Midline low back pain without sciatica 09/20/2015  . Leukocytosis 11/29/2014  . Osteoarthritis of left knee 11/20/2014  . History of ventral hernia repair 10/27/2014  . Allergic rhinitis 08/17/2014  . Axillary hidradenitis suppurativa 08/17/2014  . Baker cyst 08/17/2014  . Chronic constipation 08/17/2014  . 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 Engages in travel abroad 08/17/2014  . Fibromyalgia 08/17/2014  . Cardiac murmur 08/17/2014  . Dysmetabolic syndrome 08/17/2014  . Plantar fasciitis 08/17/2014  . Beat, premature ventricular 08/17/2014  . 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 Obstructive apnea 12/02/2013  . Essential (primary) hypertension 12/09/2009  . Hypertrichosis 12/09/2009    Past Surgical History:  Procedure Laterality Date  .  FOOT ARTHRODESIS Right 12/01/2016   Procedure: ARTHRODESIS FOOT; TRIPLE;  Surgeon: [name], DPM;  Location: [facility] ORS;  Service: Podiatry;  Laterality: Right;  . HERNIA REPAIR  10/09/11   [facility]ral Incarcerated- Dr. [name]  . OOPHORECTOMY Left 12/09/2009  . TUBAL LIGATION  1993  . VENTRAL HERNIA REPAIR  10/09/2011    Family History  Problem Relation Age of Onset  . Hypertension Mother   . CVA Mother   . Kidney  disease Mother   . Congenital heart disease Mother   . Heart disease Father   . Hypertension Father   . Diabetes Father   . Breast cancer Paternal Grandmother        Bilateral  . Colon cancer Maternal Grandfather   . Colon cancer Maternal Uncle     Social History   Socioeconomic History  . Marital status: Married    Spouse name: Not on file  . Number of children: 3  . Years of education: Associates  . Highest education level: Associate degree: academic program  Occupational History  . 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 Financial resource strain: Not hard at all  . Food insecurity    Worry: Never true    Inability: Never true  . Transportation needs    Medical: No    Non-medical: No  Tobacco Use  . Smoking status: Never Smoker  . Smokeless tobacco: Never Used  Substance and Sexual Activity  . Alcohol use: No    Alcohol/week: 0.0 standard drinks  . Drug use: No  . Sexual activity: Yes    Partners: Male    Birth control/protection: I.U.D.  Lifestyle  . Physical activity    Days per week: 0 days    Minutes per session: Not on file  . Stress: Not at all  Relationships  . Social connections    Talks on phone: More than three times a week    Gets together: Never    Attends religious service: More than 4 times per year    Active member of club or organization: No    Attends meetings of clubs or organizations: Never    Relationship status: Never married  . Intimate partner violence    Fear of current or ex partner: No    Emotionally abused: No    Physically abused: No    Forced sexual activity: No  Other Topics Concern  . Not on file  Social History Narrative   Married Moses in 2016 in [place] and he finally got visa to move to [place] Nov 2019     Current Outpatient Medications:  .  acetaminophen (TYLENOL) 500 MG tablet, Take 1 tablet (500 mg total) by mouth every 6 (six) hours as needed. Max of 3 grams daily or 6 pills dialy (Patient  taking differently: Take 1,000 mg by mouth every 6 (six) hours as needed for moderate pain or headache. ), Disp: 30 tablet, Rfl: 0 .  Armodafinil (NUVIGIL) 150 MG tablet, Take 1 tablet (150 mg total) by mouth daily., Disp: 90 tablet, Rfl: 0 .  aspirin EC 81 MG tablet, Take 1 tablet (81 mg total) by mouth daily., Disp: 30 tablet, Rfl: 0 .  azelastine (ASTELIN) 0.1 % nasal spray, Place 2 sprays into both nostrils 2 (two) times daily. Use in each nostril as directed, Disp: 90 mL, Rfl: 0 .  buPROPion (WELLBUTRIN XL) 150 MG 24 hr tablet, Take 1 tablet (150 mg total) by mouth daily., Disp: 90 tablet, Rfl:  1 .  Cetirizine HCl (ZYRTEC ALLERGY) 10 MG CAPS, Take 1 capsule by mouth as needed., Disp: , Rfl:  .  Cholecalciferol (VITAMIN D) 2000 UNITS tablet, Take 2,000 Units by mouth daily. , Disp: , Rfl:  .  clotrimazole-betamethasone (LOTRISONE) cream, Apply 1 application topically 2 (two) times daily., Disp: 90 g, Rfl: 0 .  docusate sodium (COLACE) 100 MG capsule, Take 100 mg by mouth daily as needed for moderate constipation. , Disp: , Rfl:  .  DULoxetine (CYMBALTA) 60 MG capsule, Take 1 capsule (60 mg total) by mouth daily., Disp: 90 capsule, Rfl: 1 .  ferrous sulfate 325 (65 FE) MG tablet, Take 325 mg by mouth daily. , Disp: , Rfl:  .  fluticasone (FLONASE) 50 MCG/ACT nasal spray, Place 2 sprays into both nostrils daily as needed for allergies., Disp: 16 g, Rfl: 1 .  hydrOXYzine (ATARAX/VISTARIL) 10 MG tablet, Take 1 tablet (10 mg total) by mouth 3 (three) times daily as needed., Disp: 30 tablet, Rfl: 0 .  levonorgestrel (MIRENA, 52 MG,) 20 MCG/24HR IUD, 1 each by Intrauterine route once. , Disp: , Rfl:  .  metaxalone (SKELAXIN) 800 MG tablet, Take 1 tablet (800 mg total) by mouth 3 (three) times daily as needed for muscle spasms., Disp: 90 tablet, Rfl: 1 .  Multiple Vitamins-Minerals (MULTIVITAMIN PO), Take 1 tablet by mouth daily., Disp: , Rfl:  .  olmesartan (BENICAR) 40 MG tablet, Take 1 tablet (40 mg  total) by mouth daily., Disp: 90 tablet, Rfl: 1 .  TRAVATAN Z 0.004 % SOLN ophthalmic solution, Place 1 drop into both eyes at bedtime., Disp: , Rfl: 3 .  fluconazole (DIFLUCAN) 150 MG tablet, Take 1 tablet (150 mg total) by mouth every other day., Disp: 3 tablet, Rfl: 0  Allergies  Allergen Reactions  . Lisinopril Cough       . Norvasc [Amlodipine Besylate]     I personally reviewed active problem list, medication list, allergies, family history, social history with the patient/caregiver today.   ROS  Constitutional: Negative for fever or weight change.  Respiratory: Negative for cough and shortness of breath.   Cardiovascular: Negative for chest pain or palpitations.  Gastrointestinal: Negative for abdominal pain, no bowel changes.  Musculoskeletal: Negative for gait problem or joint swelling.  Skin: Negative for rash.  Neurological: Negative for dizziness or headache.  No other specific complaints in a complete review of systems (except as listed in HPI above).  Objective  Vitals:   10/18/18 1141  BP: (!) 144/76  Pulse: 96  Resp: 18  Temp: (!) 97.3 F (36.3 C)  TempSrc: Temporal  SpO2: 99%  Weight: 237 lb (107.5 kg)  Height: 5\' 1"  (1.549 m)    Body mass index is 44.78 kg/m.  Physical Exam  Constitutional: Patient appears well-developed and well-nourished. Obese  No distress.  HEENT: head atraumatic, normocephalic, pupils equal and reactive to light, neck supple Cardiovascular: Normal rate, regular rhythm and normal heart sounds.  No murmur heard.  Pulmonary/Chest: Effort normal and breath sounds normal. No respiratory distress. Abdominal: Soft.  There is no tenderness. Muscular skeletal: flat feet, scars from surgery on right foot, , mild swelling on left ankle  Psychiatric: Patient has a normal mood and affect. behavior is normal. Judgment and thought content normal.  Recent Results (from the past 2160 hour(s))  Wound culture     Status: None   Collection  Time: 08/07/18  9:49 AM   Specimen: Wound  Result Value Ref Range  MICRO NUMBER: [id number]    SPECIMEN QUALITY: Adequate    SOURCE: ABSCESS    STATUS: FINAL    GRAM STAIN:      No white blood cells seen No epithelial cells seen No organisms seen   RESULT: No Growth      PHQ2/9: [facility] screen [facility] 10/18/2018 08/07/2018 07/17/2018 06/07/2018 04/17/2018  Decreased Interest 2 1 0 0 0  Down, Depressed, Hopeless 2 1 0 0 1  PHQ - 2 Score 4 2 0 0 1  Altered sleeping 1 1 0 1 1  Tired, decreased energy 2 1 0 3 1  Change in appetite 2 1 0 0 0  Feeling bad or failure about yourself  2 1 0 0 1  Trouble concentrating 2 1 0 0 0  Moving slowly or fidgety/restless 0 0 0 0 0  Suicidal thoughts 0 0 0 0 0  PHQ-9 Score 13 7 0 4 4  Difficult doing work/chores Somewhat difficult Somewhat difficult Not difficult at all Not difficult at all Somewhat difficult  Some recent data might be hidden    phq 9 is positive   Fall Risk: Fall Risk  10/18/2018 07/17/2018 06/07/2018 04/17/2018 03/14/2018  Falls in the past year? 0 0 0 0 0  Number falls in past yr: 0 0 0 - -  Injury with Fall? 0 0 0 - -    Functional Status Survey: Is the patient deaf or have difficulty hearing?: No Does the patient have difficulty seeing, even when wearing glasses/contacts?: Yes Does the patient have difficulty concentrating, remembering, or making decisions?: No Does the patient have difficulty walking or climbing stairs?: Yes Does the patient have difficulty dressing or bathing?: No Does the patient have difficulty doing errands alone such as visiting a doctor's office or shopping?: No    Assessment & Plan  1. Obstructive apnea  - Armodafinil (NUVIGIL) 150 MG tablet; Take 1 tablet (150 mg total) by mouth daily.  Dispense: 90 tablet; Refill: 0  2. Shift work sleep disorder  - Armodafinil (NUVIGIL) 150 MG tablet; Take 1 tablet (150 mg total) by mouth daily.  Dispense: 90 tablet; Refill: 0  3. Depression, major,  recurrent, mild (HCC)  Doing a little worse, states she is sad that she had to switch her job and upset about starting a new job  - buPROPion (WELLBUTRIN XL) 150 MG 24 hr tablet; Take 1 tablet (150 mg total) by mouth daily.  Dispense: 90 tablet; Refill: 1 - DULoxetine (CYMBALTA) 60 MG capsule; Take 1 capsule (60 mg total) by mouth daily.  Dispense: 90 capsule; Refill: 1  4. Fibromyalgia  - DULoxetine (CYMBALTA) 60 MG capsule; Take 1 capsule (60 mg total) by mouth daily.  Dispense: 90 capsule; Refill: 1 - metaxalone (SKELAXIN) 800 MG tablet; Take 1 tablet (800 mg total) by mouth 3 (three) times daily as needed for muscle spasms.  Dispense: 90 tablet; Refill: 1  5. Vulvar irritation  - fluconazole (DIFLUCAN) 150 MG tablet; Take 1 tablet (150 mg total) by mouth every other day.  Dispense: 3 tablet; Refill: 0  6. Seasonal allergic rhinitis, unspecified trigger  - azelastine (ASTELIN) 0.1 % nasal spray; Place 2 sprays into both nostrils 2 (two) times daily. Use in each nostril as directed  Dispense: 90 mL; Refill: 0 - fluticasone (FLONASE) 50 MCG/ACT nasal spray; Place 2 sprays into both nostrils daily as needed for allergies.  Dispense: 16 g; Refill: 1  7. Essential (primary) hypertension  - olmesartan (BENICAR) 40 MG  tablet; Take 1 tablet (40 mg total) by mouth daily.  Dispense: 90 tablet; Refill: 1 - COMPLETE METABOLIC PANEL WITH GFR - CBC with Differential/Platelet - Microalbumin / creatinine urine ratio  8. Needs flu shot  - Flu Vaccine QUAD 36+ mos IM  9. Hyperglycemia  - Hemoglobin A1c  10. Morbid obesity (HCC)  Discussed with the patient the risk posed by an increased BMI. Discussed importance of portion control, calorie counting and at least 150 minutes of physical activity weekly. Avoid sweet beverages and drink more water. Eat at least 6 servings of fruit and vegetables daily   11. Dyslipidemia  - Lipid panel  12. Dysmetabolic syndrome  - Hemoglobin A1c  13.  History of iron deficiency  - Iron, TIBC and Ferritin Panel  14. Need for Tdap vaccination  - Tdap vaccine greater than or equal to 7yo IM

## 2018-10-19 LAB — COMPLETE METABOLIC PANEL WITH GFR
AG Ratio: 1.5 (calc) (ref 1.0–2.5)
ALT: 12 U/L (ref 6–29)
AST: 11 U/L (ref 10–35)
Albumin: 4.2 g/dL (ref 3.6–5.1)
Alkaline phosphatase (APISO): 77 U/L (ref 31–125)
BUN: 13 mg/dL (ref 7–25)
CO2: 29 mmol/L (ref 20–32)
Calcium: 9.7 mg/dL (ref 8.6–10.2)
Chloride: 104 mmol/L (ref 98–110)
Creat: 0.65 mg/dL (ref 0.50–1.10)
GFR, Est African American: 121 mL/min/{1.73_m2} (ref >=60)
GFR, Est Non African American: 104 mL/min/{1.73_m2} (ref >=60)
Globulin: 2.8 g/dL (calc) (ref 1.9–3.7)
Glucose, Bld: 105 mg/dL — ABNORMAL HIGH (ref 65–99)
Potassium: 4.1 mmol/L (ref 3.5–5.3)
Sodium: 141 mmol/L (ref 135–146)
Total Bilirubin: 0.2 mg/dL (ref 0.2–1.2)
Total Protein: 7 g/dL (ref 6.1–8.1)

## 2018-10-19 LAB — IRON,TIBC AND FERRITIN PANEL
%SAT: 12 % (calc) — ABNORMAL LOW (ref 16–45)
Ferritin: 92 ng/mL (ref 16–232)
Iron: 47 ug/dL (ref 40–190)
TIBC: 385 mcg/dL (calc) (ref 250–450)

## 2018-10-19 LAB — CBC WITH DIFFERENTIAL/PLATELET
Absolute Monocytes: 737 cells/uL (ref 200–950)
Basophils Absolute: 44 cells/uL (ref 0–200)
Basophils Relative: 0.4 %
Eosinophils Absolute: 231 cells/uL (ref 15–500)
Eosinophils Relative: 2.1 %
HCT: 40 % (ref 35.0–45.0)
Hemoglobin: 12.7 g/dL (ref 11.7–15.5)
Lymphs Abs: 3311 cells/uL (ref 850–3900)
MCH: 25.9 pg — ABNORMAL LOW (ref 27.0–33.0)
MCHC: 31.8 g/dL — ABNORMAL LOW (ref 32.0–36.0)
MCV: 81.5 fL (ref 80.0–100.0)
MPV: 8.6 fL (ref 7.5–12.5)
Monocytes Relative: 6.7 %
Neutro Abs: 6677 cells/uL (ref 1500–7800)
Neutrophils Relative %: 60.7 %
Platelets: 365 10*3/uL (ref 140–400)
RBC: 4.91 10*6/uL (ref 3.80–5.10)
RDW: 15.5 % — ABNORMAL HIGH (ref 11.0–15.0)
Total Lymphocyte: 30.1 %
WBC: 11 10*3/uL — ABNORMAL HIGH (ref 3.8–10.8)

## 2018-10-19 LAB — LIPID PANEL
Cholesterol: 203 mg/dL — ABNORMAL HIGH (ref <200)
HDL: 45 mg/dL — ABNORMAL LOW (ref >=50)
LDL Cholesterol (Calc): 127 mg/dL (calc) — ABNORMAL HIGH
Non-HDL Cholesterol (Calc): 158 mg/dL (calc) — ABNORMAL HIGH (ref <130)
Total CHOL/HDL Ratio: 4.5 (calc) (ref <5.0)
Triglycerides: 185 mg/dL — ABNORMAL HIGH (ref <150)

## 2018-10-19 LAB — MICROALBUMIN / CREATININE URINE RATIO
Creatinine, Urine: 155 mg/dL (ref 20–275)
Microalb Creat Ratio: 72 mcg/mg creat — ABNORMAL HIGH (ref <30)
Microalb, Ur: 11.2 mg/dL

## 2018-10-19 LAB — HEMOGLOBIN A1C
Hgb A1c MFr Bld: 5.9 % of total Hgb — ABNORMAL HIGH (ref <5.7)
Mean Plasma Glucose: 123 (calc)
eAG (mmol/L): 6.8 (calc)

## 2018-10-21 ENCOUNTER — Ambulatory Visit
Admission: RE | Admit: 2018-10-21 | Discharge: 2018-10-21 | Disposition: A | Discharge status: Home / Self Care | Payer: 59 | Source: Ambulatory Visit | Attending: Family Medicine | Admitting: Family Medicine

## 2018-10-21 DIAGNOSIS — N6320 Unspecified lump in the left breast, unspecified quadrant: Secondary | ICD-10-CM | POA: Diagnosis not present

## 2018-10-21 DIAGNOSIS — N6324 Unspecified lump in the left breast, lower inner quadrant: Secondary | ICD-10-CM | POA: Diagnosis not present

## 2018-10-21 DIAGNOSIS — R928 Other abnormal and inconclusive findings on diagnostic imaging of breast: Secondary | ICD-10-CM | POA: Diagnosis not present

## 2019-02-10 ENCOUNTER — Other Ambulatory Visit: Payer: Self-pay | Admitting: Family Medicine

## 2019-02-10 DIAGNOSIS — Z1239 Encounter for other screening for malignant neoplasm of breast: Secondary | ICD-10-CM

## 2019-04-15 ENCOUNTER — Other Ambulatory Visit: Payer: Self-pay

## 2019-04-15 ENCOUNTER — Ambulatory Visit: Payer: 59 | Admitting: Family Medicine

## 2019-04-15 ENCOUNTER — Encounter: Payer: Self-pay | Admitting: Family Medicine

## 2019-04-15 VITALS — BP 130/80 | HR 100 | Temp 97.8°F | Resp 16 | Ht 61.0 in | Wt 242.0 lb

## 2019-04-15 DIAGNOSIS — M797 Fibromyalgia: Secondary | ICD-10-CM | POA: Diagnosis not present

## 2019-04-15 DIAGNOSIS — R739 Hyperglycemia, unspecified: Secondary | ICD-10-CM

## 2019-04-15 DIAGNOSIS — I10 Essential (primary) hypertension: Secondary | ICD-10-CM

## 2019-04-15 DIAGNOSIS — G4726 Circadian rhythm sleep disorder, shift work type: Secondary | ICD-10-CM

## 2019-04-15 DIAGNOSIS — E785 Hyperlipidemia, unspecified: Secondary | ICD-10-CM

## 2019-04-15 DIAGNOSIS — F330 Major depressive disorder, recurrent, mild: Secondary | ICD-10-CM

## 2019-04-15 DIAGNOSIS — Z1211 Encounter for screening for malignant neoplasm of colon: Secondary | ICD-10-CM

## 2019-04-15 DIAGNOSIS — G4733 Obstructive sleep apnea (adult) (pediatric): Secondary | ICD-10-CM | POA: Diagnosis not present

## 2019-04-15 DIAGNOSIS — E8881 Metabolic syndrome: Secondary | ICD-10-CM

## 2019-04-15 MED ORDER — ARMODAFINIL 150 MG PO TABS: 150.0000 mg | ORAL_TABLET | Freq: Every day | ORAL | 0 refills | Status: DC

## 2019-04-15 MED ORDER — DULOXETINE HCL 60 MG PO CPEP: 60.0000 mg | ORAL_CAPSULE | Freq: Every day | ORAL | 1 refills | Status: DC

## 2019-04-15 MED ORDER — BUPROPION HCL ER (XL) 150 MG PO TB24: 150.0000 mg | ORAL_TABLET | Freq: Every day | ORAL | 1 refills | Status: DC

## 2019-04-15 MED ORDER — OLMESARTAN MEDOXOMIL 40 MG PO TABS: 40.0000 mg | ORAL_TABLET | Freq: Every day | ORAL | 1 refills | Status: DC

## 2019-04-15 NOTE — Progress Notes (Signed)
Name: Kathleen Conley   MRN: [id number]    DOB: 1970/10/06   Date:04/15/2019       Progress Note  Subjective  Chief Complaint  Chief Complaint  Patient presents with  . Medication Refill    6 month F/U  . Depression  . Hypertension  . GAD  . Sleep Apnea  . Fibromyalgia  . Dyslipidmia  . Metabolic Syndrome and Obesity    HPI  Major depression:she is doing well, but worried about seasonal affective disorder,she is back onWellbutrinsince Fall 2019, she was doing well, adjusting to married life, new job, having a teenager at home. Phq 9 has improved   Metabolic Syndrome and Obesity: we tried giving her Trulicity but needs step therapy, shedid not likeMetformin - caused diarrhea. She denies polyphagia, polyuria or polydipsia.She is only on life style modification  Obesity: she has a long history of obesity, started after her divorce when she was in her 80's. She tried weight watchers but was unsuccessful.She is a stress eater, working now [employer] shift 7 pm to 7 am, weekends. She asked me about intermittent fasting, also discussed increase in exercise - like walking in a pool  FMS: she states pain is better with Cymbalta, could not tolerate Lyrica ( caused nightmares and vivid dreams)also stopped Gabapentin ( she was grinding her teeth), pain level on Cymbalta is3/10,she takes skelaxin about once a dayat most for cramps.   OSA: she has mild symptoms,did not qualify for CPAP,avoiding sleeping on her back,trying to sleep  on her side, she is not waking up with headaches. She also has shift work sleep disorder and states nuvigil helps her stay awake.  HTN:bp is at goal, no chest pain or palpitation, taking medication daily   Insomnia: she is doing well at this time, except on weekends, she works [employer] shifts   Leucocytosis and anemia: she has seen hematologist in the past and does not want to go back at this time.She has hidradenitis suppurative . recheck  yearly     Patient Active Problem List   Diagnosis Date Noted  . Morbid obesity ([place]) 04/17/2018  . 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 Flat foot 01/28/2016  . Aortic sclerosis 10/14/2015  . Midline low back pain without sciatica 09/20/2015  . Leukocytosis 11/29/2014  . Osteoarthritis of left knee 11/20/2014  . History of ventral hernia repair 10/27/2014  . Allergic rhinitis 08/17/2014  . Axillary hidradenitis suppurativa 08/17/2014  . Baker cyst 08/17/2014  . Chronic constipation 08/17/2014  . Depression, major, recurrent, mild ([place]) 08/17/2014  . Engages in travel abroad 08/17/2014  . Fibromyalgia 08/17/2014  . Cardiac murmur 08/17/2014  . Dysmetabolic syndrome 81/82/9937  . Plantar fasciitis 08/17/2014  . Beat, premature ventricular 08/17/2014  . 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 Obstructive apnea 12/02/2013  . Essential (primary) hypertension 12/09/2009  . Hypertrichosis 12/09/2009    Past Surgical History:  Procedure Laterality Date  . FOOT ARTHRODESIS Right 12/01/2016   Procedure: ARTHRODESIS FOOT; TRIPLE;  Surgeon: Samara Deist, DPM;  Location: [facility] ORS;  Service: Podiatry;  Laterality: Right;  . HERNIA REPAIR  10/09/11   Ventral Incarcerated- Dr. Pat Patrick  . OOPHORECTOMY Left 12/09/2009  . TUBAL LIGATION  1993  . VENTRAL HERNIA REPAIR  10/09/2011    Family History  Problem Relation Age of Onset  . Hypertension Mother   . CVA Mother   . Kidney disease Mother   . Congenital heart disease Mother   . Heart disease  Father   . Hypertension Father   . Diabetes Father   . Breast cancer Paternal Grandmother        Bilateral  . Colon cancer Maternal Grandfather   . Colon cancer Maternal Uncle     Social History   Tobacco Use  . Smoking status: Never Smoker  . Smokeless tobacco: Never Used  Substance Use Topics  . Alcohol use: No    Alcohol/week: 0.0 standard drinks  . Drug use: No      Current Outpatient Medications:  .  acetaminophen (TYLENOL) 500 MG tablet, Take 1 tablet (500 mg total) by mouth every 6 (six) hours as needed. Max of 3 grams daily or 6 pills dialy (Patient taking differently: Take 1,000 mg by mouth every 6 (six) hours as needed for moderate pain or headache. ), Disp: 30 tablet, Rfl: 0 .  Armodafinil (NUVIGIL) 150 MG tablet, Take 1 tablet (150 mg total) by mouth daily., Disp: 90 tablet, Rfl: 0 .  aspirin EC 81 MG tablet, Take 1 tablet (81 mg total) by mouth daily., Disp: 30 tablet, Rfl: 0 .  azelastine (ASTELIN) 0.1 % nasal spray, Place 2 sprays into both nostrils 2 (two) times daily. Use in each nostril as directed, Disp: 90 mL, Rfl: 0 .  buPROPion (WELLBUTRIN XL) 150 MG 24 hr tablet, Take 1 tablet (150 mg total) by mouth daily., Disp: 90 tablet, Rfl: 1 .  Cetirizine HCl (ZYRTEC ALLERGY) 10 MG CAPS, Take 1 capsule by mouth as needed., Disp: , Rfl:  .  Cholecalciferol (VITAMIN D) 2000 UNITS tablet, Take 2,000 Units by mouth daily. , Disp: , Rfl:  .  clotrimazole-betamethasone (LOTRISONE) cream, Apply 1 application topically 2 (two) times daily., Disp: 90 g, Rfl: 0 .  docusate sodium (COLACE) 100 MG capsule, Take 100 mg by mouth daily as needed for moderate constipation. , Disp: , Rfl:  .  DULoxetine (CYMBALTA) 60 MG capsule, Take 1 capsule (60 mg total) by mouth daily., Disp: 90 capsule, Rfl: 1 .  ferrous sulfate 325 (65 FE) MG tablet, Take 325 mg by mouth daily. , Disp: , Rfl:  .  fluticasone (FLONASE) 50 MCG/ACT nasal spray, Place 2 sprays into both nostrils daily as needed for allergies., Disp: 16 g, Rfl: 1 .  levonorgestrel (MIRENA, 52 MG,) 20 MCG/24HR IUD, 1 each by Intrauterine route once. , Disp: , Rfl:  .  metaxalone (SKELAXIN) 800 MG tablet, Take 1 tablet (800 mg total) by mouth 3 (three) times daily as needed for muscle spasms., Disp: 90 tablet, Rfl: 1 .  Multiple Vitamins-Minerals (MULTIVITAMIN PO), Take 1 tablet by mouth daily., Disp: , Rfl:  .   olmesartan (BENICAR) 40 MG tablet, Take 1 tablet (40 mg total) by mouth daily., Disp: 90 tablet, Rfl: 1 .  TRAVATAN Z 0.004 % SOLN ophthalmic solution, Place 1 drop into both eyes at bedtime., Disp: , Rfl: 3  Allergies  Allergen Reactions  . Lisinopril Cough       . Norvasc [Amlodipine Besylate]     I personally reviewed active problem list, medication list, allergies, family history, social history, health maintenance with the patient/caregiver today.   ROS  Constitutional: Negative for fever or weight change.  Respiratory: Negative for cough and shortness of breath.   Cardiovascular: Negative for chest pain or palpitations.  Gastrointestinal: Negative for abdominal pain, no bowel changes.  Musculoskeletal: Positive for gait problem ( left ankle pain seeing Dr. Ether Griffins ) and intermittent  Left ankle joint swelling.  Skin: Negative for  rash.  Neurological: Negative for dizziness or headache.  No other specific complaints in a complete review of systems (except as listed in HPI above).  Objective  Vitals:   04/15/19 1137  BP: 130/80  Pulse: 100  Resp: 16  Temp: 97.8 F (36.6 C)  TempSrc: Temporal  SpO2: 97%  Weight: 242 lb (109.8 kg)  Height: 5\' 1"  (1.549 m)    Body mass index is 45.73 kg/m.  Physical Exam  Constitutional: Patient appears well-developed and well-nourished. Obese  No distress.  HEENT: head atraumatic, normocephalic, pupils equal and reactive to light Cardiovascular: Normal rate, regular rhythm and normal heart sounds.  No murmur heard. No BLE edema. Pulmonary/Chest: Effort normal and breath sounds normal. No respiratory distress. Abdominal: Soft.  There is no tenderness. Psychiatric: Patient has a normal mood and affect. behavior is normal. Judgment and thought content normal.  PHQ2/9: Depression screen [facility] 2/9 04/15/2019 10/18/2018 08/07/2018 07/17/2018 06/07/2018  Decreased Interest 1 2 1  0 0  Down, Depressed, Hopeless 0 2 1 0 0  PHQ - 2 Score 1 4 2   0 0  Altered sleeping 0 1 1 0 1  Tired, decreased energy 1 2 1  0 3  Change in appetite 1 2 1  0 0  Feeling bad or failure about yourself  0 2 1 0 0  Trouble concentrating 0 2 1 0 0  Moving slowly or fidgety/restless 0 0 0 0 0  Suicidal thoughts 0 0 0 0 0  PHQ-9 Score 3 13 7  0 4  Difficult doing work/chores Not difficult at all Somewhat difficult Somewhat difficult Not difficult at all Not difficult at all  Some recent data might be hidden    phq 9 is positive   Fall Risk: Fall Risk  04/15/2019 10/18/2018 07/17/2018 06/07/2018 04/17/2018  Falls in the past year? 0 0 0 0 0  Number falls in past yr: 0 0 0 0 -  Injury with Fall? 0 0 0 0 -     Functional Status Survey: Is the patient deaf or have difficulty hearing?: No Does the patient have difficulty seeing, even when wearing glasses/contacts?: Yes Does the patient have difficulty concentrating, remembering, or making decisions?: No Does the patient have difficulty walking or climbing stairs?: No Does the patient have difficulty dressing or bathing?: No Does the patient have difficulty doing errands alone such as visiting a doctor's office or shopping?: No    Assessment & Plan  1. Obstructive apnea  - Armodafinil (NUVIGIL) 150 MG tablet; Take 1 tablet (150 mg total) by mouth daily.  Dispense: 90 tablet; Refill: 0  2. Fibromyalgia  - DULoxetine (CYMBALTA) 60 MG capsule; Take 1 capsule (60 mg total) by mouth daily.  Dispense: 90 capsule; Refill: 1  3. Essential (primary) hypertension  - olmesartan (BENICAR) 40 MG tablet; Take 1 tablet (40 mg total) by mouth daily.  Dispense: 90 tablet; Refill: 1  4. Morbid obesity (HCC)  Discussed with the patient the risk posed by an increased BMI. Discussed importance of portion control, calorie counting and at least 150 minutes of physical activity weekly. Avoid sweet beverages and drink more water. Eat at least 6 servings of fruit and vegetables daily   5. Dysmetabolic syndrome   6.  Hyperglycemia   7. Dyslipidemia   8. Depression, major, recurrent, mild (HCC)  - DULoxetine (CYMBALTA) 60 MG capsule; Take 1 capsule (60 mg total) by mouth daily.  Dispense: 90 capsule; Refill: 1 - buPROPion (WELLBUTRIN XL) 150 MG 24 hr tablet; Take  1 tablet (150 mg total) by mouth daily.  Dispense: 90 tablet; Refill: 1  9. Shift work sleep disorder  - Armodafinil (NUVIGIL) 150 MG tablet; Take 1 tablet (150 mg total) by mouth daily.  Dispense: 90 tablet; Refill: 0

## 2019-05-16 ENCOUNTER — Telehealth: Payer: Self-pay

## 2019-05-16 NOTE — Telephone Encounter (Signed)
Copied from CRM [id number]. Topic: General - Inquiry >> May 15, 2019  3:40 PM Leary Roca wrote: Reason for CRM:  Patient is wanting to speak with a nurse regarding a rash around her neck after receiving her second dosage of covid 19 vaccination. Please advise

## 2019-05-16 NOTE — Telephone Encounter (Signed)
Called patient and left vm in regards to rash.

## 2019-06-24 ENCOUNTER — Other Ambulatory Visit: Payer: Self-pay | Admitting: Family Medicine

## 2019-06-24 DIAGNOSIS — M797 Fibromyalgia: Secondary | ICD-10-CM

## 2019-06-24 NOTE — Telephone Encounter (Signed)
Requested medication (s) are due for refill today: yes  Requested medication (s) are on the active medication list: yes  Last refill:  03/04/19  Future visit scheduled: yes  Notes to clinic:  not delegated    Requested Prescriptions  Pending Prescriptions Disp Refills   metaxalone (SKELAXIN) 800 MG tablet [Pharmacy Med Name: METAXALONE 800 MG TABS 800 Tablet] 90 tablet 1    Sig: TAKE 1 TABLET BY MOUTH 3 TIMES DAILY AS NEEDED FOR MUSCLE SPASMS.      Not Delegated - Analgesics:  Muscle Relaxants Failed - 06/24/2019  8:46 AM      Failed - This refill cannot be delegated      Passed - Valid encounter within last 6 months    Recent Outpatient Visits           2 months ago Obstructive apnea   [facility] [place], Danna Hefty, MD   8 months ago Depression, major, recurrent, mild [facility])   [facility] Alba Cory, MD   10 months ago Abscess of left axilla   [facility] Alba Cory, MD   11 months ago Vaginal discomfort   [facility] Alba Cory, MD   1 year ago Intertrigo   [facility] Alba Cory, MD       Future Appointments             In 1 month Alba Cory, MD [facility]   In 3 months Alba Cory, MD [facility]

## 2019-07-31 ENCOUNTER — Encounter: Payer: Self-pay | Admitting: Obstetrics and Gynecology

## 2019-07-31 ENCOUNTER — Ambulatory Visit (INDEPENDENT_AMBULATORY_CARE_PROVIDER_SITE_OTHER): Payer: 59 | Admitting: Obstetrics and Gynecology

## 2019-07-31 ENCOUNTER — Other Ambulatory Visit: Payer: Self-pay

## 2019-07-31 ENCOUNTER — Other Ambulatory Visit (HOSPITAL_COMMUNITY)
Admission: RE | Admit: 2019-07-31 | Discharge: 2019-07-31 | Disposition: A | Discharge status: Home / Self Care | Payer: 59 | Source: Ambulatory Visit | Attending: Obstetrics and Gynecology | Admitting: Obstetrics and Gynecology

## 2019-07-31 VITALS — BP 186/101 | HR 93 | Ht 62.0 in | Wt 233.0 lb

## 2019-07-31 DIAGNOSIS — Z124 Encounter for screening for malignant neoplasm of cervix: Secondary | ICD-10-CM | POA: Insufficient documentation

## 2019-07-31 DIAGNOSIS — Z1239 Encounter for other screening for malignant neoplasm of breast: Secondary | ICD-10-CM | POA: Diagnosis not present

## 2019-07-31 DIAGNOSIS — R102 Pelvic and perineal pain: Secondary | ICD-10-CM

## 2019-07-31 DIAGNOSIS — N951 Menopausal and female climacteric states: Secondary | ICD-10-CM

## 2019-07-31 DIAGNOSIS — B354 Tinea corporis: Secondary | ICD-10-CM

## 2019-07-31 DIAGNOSIS — Z1211 Encounter for screening for malignant neoplasm of colon: Secondary | ICD-10-CM

## 2019-07-31 MED ORDER — NYSTATIN 100000 UNIT/GM EX POWD: 1.0000 "application " | Freq: Two times a day (BID) | CUTANEOUS | 0 refills | Status: DC | PRN

## 2019-07-31 NOTE — Progress Notes (Signed)
Gynecology Annual Exam  PCP: Alba Cory, MD  Chief Complaint:  Chief Complaint  Patient presents with  . Gynecologic Exam    Random right side pain, history of cyst    History of Present Illness:Patient is a 49 y.o. G3P3 presents for annual exam. The patient has no complaints today.   LMP: No LMP recorded. (Menstrual status: IUD). No vaginal bleeding.  The patient is sexually active. She denies dyspareunia.  The patient does perform self breast exams.  There is no notable family history of breast or ovarian cancer in her family.  The patient wears seatbelts: yes.   The patient has regular exercise: not asked.    The patient denies current symptoms of depression.     Has noted some right lower quadrant off an on discomfort, sharp twinges.    Review of Systems: Review of Systems  Constitutional: Negative for chills and fever.  HENT: Negative for congestion.   Respiratory: Negative for cough and shortness of breath.   Cardiovascular: Negative for chest pain and palpitations.  Gastrointestinal: Negative for abdominal pain, constipation, diarrhea, heartburn, nausea and vomiting.  Genitourinary: Negative for dysuria, frequency and urgency.  Skin: Negative for itching and rash.  Neurological: Negative for dizziness and headaches.  Endo/Heme/Allergies: Negative for polydipsia.  Psychiatric/Behavioral: Negative for depression.    Past Medical History:  Patient Active Problem List   Diagnosis Date Noted  . Morbid obesity (HCC) 04/17/2018  . 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 Flat foot 01/28/2016  . Aortic sclerosis 10/14/2015  . Midline low back pain without sciatica 09/20/2015  . Leukocytosis 11/29/2014  . Osteoarthritis of left knee 11/20/2014  . History of ventral hernia repair 10/27/2014  . Allergic rhinitis 08/17/2014  . Axillary hidradenitis suppurativa 08/17/2014    Has been taking Doxycycline for 7 days as needed but feels that she may be  manifesting a degree of resistence to this medication. Has previously been evaluated by Dr. Birdie Sons and is not yet a surgical candidate.   Excell Seltzer cyst 08/17/2014  . Chronic constipation 08/17/2014  . 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 Engages in travel abroad 08/17/2014  . Fibromyalgia 08/17/2014  . Cardiac murmur 08/17/2014  . Dysmetabolic syndrome 08/17/2014  . Plantar fasciitis 08/17/2014  . Beat, premature ventricular 08/17/2014  . 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 Obstructive apnea 12/02/2013    no need for CPAP   . Essential (primary) hypertension 12/09/2009  . Hypertrichosis 12/09/2009    Past Surgical History:  Past Surgical History:  Procedure Laterality Date  . FOOT ARTHRODESIS Right 12/01/2016   Procedure: ARTHRODESIS FOOT; TRIPLE;  Surgeon: [name], DPM;  Location: [facility] ORS;  Service: Podiatry;  Laterality: Right;  . HERNIA REPAIR  10/09/11   Ventral Incarcerated- Dr. [name]  . OOPHORECTOMY Left 12/09/2009  . TUBAL LIGATION  1993  . VENTRAL HERNIA REPAIR  10/09/2011    Gynecologic History:  No LMP recorded. (Menstrual status: IUD). Last Pap: Results were: 11/28/2016 NIL and HR HPV negative  Last mammogram:10/21/2018 Results were: BI-RAD II  Mirena IUD 11/14/2012  Obstetric History: G3P3  Family History:  Family History  Problem Relation Age of Onset  . Hypertension Mother   . CVA Mother   . Kidney disease Mother   . Congenital heart disease Mother   . Heart disease Father   . Hypertension Father   . Diabetes Father   . Breast cancer Paternal Grandmother  Bilateral  . Colon cancer Maternal Grandfather   . Colon cancer Maternal Uncle     Social History:  Social History   Socioeconomic History  . Marital status: Married    Spouse name: Not on file  . Number of children: 3  . Years of education: Associates  . Highest education level: Associate degree: academic  program  Occupational History  . Occupation: LPN at the [employer]  . Smoking status: Never Smoker  . Smokeless tobacco: Never Used  Vaping Use  . Vaping Use: Never used  Substance and Sexual Activity  . Alcohol use: No    Alcohol/week: 0.0 standard drinks  . Drug use: No  . Sexual activity: Yes    Partners: Male    Birth control/protection: I.U.D.  Other Topics Concern  . Not on file  Social History Narrative   Married Moses in 2016 in [place] and he finally got visa to move to [place] Nov 2019   Social Determinants of Health   Financial Resource Strain:   . Difficulty of Paying Living Expenses:   Food Insecurity:   . Worried About Programme researcher, broadcasting/film/video in the Last Year:   . Barista in the Last Year:   Transportation Needs:   . Freight forwarder (Medical):   Marland Kitchen Lack of Transportation (Non-Medical):   Physical Activity:   . Days of Exercise per Week:   . Minutes of Exercise per Session:   Stress:   . Feeling of Stress :   Social Connections:   . Frequency of Communication with Friends and Family:   . Frequency of Social Gatherings with Friends and Family:   . Attends Religious Services:   . Active Member of Clubs or Organizations:   . Attends Banker Meetings:   Marland Kitchen Marital Status:   Intimate Partner Violence:   . Fear of Current or Ex-Partner:   . Emotionally Abused:   Marland Kitchen Physically Abused:   . Sexually Abused:     Allergies:  Allergies  Allergen Reactions  . Lisinopril Cough       . Norvasc [Amlodipine Besylate]     Medications: Prior to Admission medications   Medication Sig Start Date End Date Taking? Authorizing Provider  acetaminophen (TYLENOL) 500 MG tablet Take 1 tablet (500 mg total) by mouth every 6 (six) hours as needed. Max of 3 grams daily or 6 pills dialy Patient taking differently: Take 1,000 mg by mouth every 6 (six) hours as needed for moderate pain or headache.  06/18/15  Yes Sowles, Danna Hefty, MD    Armodafinil (NUVIGIL) 150 MG tablet Take 1 tablet (150 mg total) by mouth daily. Patient taking differently: Take 150 mg by mouth daily as needed (shift work--sleepiness).  04/15/19  Yes Alba Cory, MD  aspirin EC 81 MG tablet Take 1 tablet (81 mg total) by mouth daily. Patient taking differently: Take 81 mg by mouth every evening.  01/28/16  Yes Sowles, Danna Hefty, MD  buPROPion (WELLBUTRIN XL) 150 MG 24 hr tablet Take 1 tablet (150 mg total) by mouth daily. Patient taking differently: Take 150 mg by mouth every evening.  04/15/19  Yes Sowles, Danna Hefty, MD  cetirizine (ZYRTEC) 10 MG tablet Take 10 mg by mouth every evening.   Yes [provider]  Cholecalciferol (VITAMIN D) 2000 UNITS tablet Take 2,000 Units by mouth every evening.    Yes [provider]  clotrimazole-betamethasone (LOTRISONE) cream Apply 1 application topically 2 (two) times daily. Patient  taking differently: Apply 1 application topically 2 (two) times daily as needed (skin irritation.).  06/07/18  Yes Sowles, Drue Stager, MD  docusate sodium (COLACE) 100 MG capsule Take 100 mg by mouth daily as needed for moderate constipation.    Yes [provider]  DULoxetine (CYMBALTA) 60 MG capsule Take 1 capsule (60 mg total) by mouth daily. Patient taking differently: Take 60 mg by mouth every evening.  04/15/19  Yes Sowles, Drue Stager, MD  ferrous sulfate 325 (65 FE) MG tablet Take 325 mg by mouth every evening.    Yes [provider]  fluticasone (FLONASE) 50 MCG/ACT nasal spray Place 2 sprays into both nostrils daily as needed for allergies. 10/18/18  Yes Sowles, Drue Stager, MD  ibuprofen (ADVIL) 200 MG tablet Take 200-600 mg by mouth every 6 (six) hours as needed (for pain.).   Yes [provider]  levonorgestrel (MIRENA, 52 MG,) 20 MCG/24HR IUD 1 each by Intrauterine route once.    Yes [provider]  metaxalone (SKELAXIN) 800 MG tablet TAKE 1 TABLET BY MOUTH 3 TIMES DAILY AS NEEDED FOR MUSCLE  SPASMS. Patient taking differently: Take 800 mg by mouth 3 (three) times daily as needed for muscle spasms.  06/24/19  Yes Sowles, Drue Stager, MD  Multiple Vitamin (MULTIVITAMIN WITH MINERALS) TABS tablet Take 1 tablet by mouth every evening.   Yes [provider]  olmesartan (BENICAR) 40 MG tablet Take 1 tablet (40 mg total) by mouth daily. Patient taking differently: Take 40 mg by mouth every evening.  04/15/19  Yes Sowles, Drue Stager, MD  TRAVATAN Z 0.004 % SOLN ophthalmic solution Place 1 drop into both eyes at bedtime. 08/21/17  Yes Pa, [employer]    Physical Exam Vitals: Blood pressure (!) 186/101, pulse 93, height 5\' 2"  (1.575 m), weight 233 lb (105.7 kg).  General: NAD HEENT: normocephalic, anicteric Thyroid: no enlargement, no palpable nodules Pulmonary: No increased work of breathing, CTAB Cardiovascular: RRR, distal pulses 2+ Breast: Breast symmetrical, no tenderness, no palpable nodules or masses, no skin or nipple retraction present, no nipple discharge.  No axillary or supraclavicular lymphadenopathy. Abdomen: NABS, soft, non-tender, non-distended.  Umbilicus without lesions.  No hepatomegaly, splenomegaly or masses palpable. No evidence of hernia  Genitourinary:  External: Normal external female genitalia.  Normal urethral meatus, normal Bartholin's and Skene's glands.    Vagina: Normal vaginal mucosa, no evidence of prolapse.    Cervix: Grossly normal in appearance, no bleeding, IUD strings visualized  Uterus: Non-enlarged, mobile, normal contour.  No CMT  Adnexa: ovaries non-enlarged, no adnexal masses  Rectal: deferred  Lymphatic: no evidence of inguinal lymphadenopathy Extremities: no edema, erythema, or tenderness Neurologic: Grossly intact Psychiatric: mood appropriate, affect full  Female chaperone present for pelvic and breast  portions of the physical exam     Assessment: 49 y.o. G3P3 routine annual exam  Plan: Problem List Items Addressed This  Visit    None    Visit Diagnoses    Pelvic pain    -  Primary   Relevant Orders   US Transvaginal Non-OB   Screening for malignant neoplasm of cervix       Relevant Orders   Cytology - PAP   Breast screening       Relevant Orders   MM 3D SCREEN BREAST BILATERAL   Vasomotor symptoms due to menopause       Relevant Orders   Follicle stimulating hormone   Estradiol   Tinea corporis       Relevant  Medications   nystatin (MYCOSTATIN/NYSTOP) powder   Colon cancer screening       Relevant Orders   Ambulatory referral to Gastroenterology      1) Mammogram - recommend yearly screening mammogram.  Mammogram Is up to date  2) STI screening  was notoffered and therefore not obtained  3) ASCCP guidelines and rational discussed.  Patient opts for every 3 years screening interval  4) Osteoporosis  - per USPTF routine screening DEXA at age 14  5) Routine healthcare maintenance including cholesterol, diabetes screening discussed managed by PCP  6) Colonoscopy - GI referral  7) Tinea Corporis - Rx nystatin powder  8) Mirena - is due for replacement this year.  Will obtain FSH/Estradiol to make determination of removal vs removal and replacement  9) Right sided pelvic pain - normal exam today.  Will obtained TVUS to further evaluation for adnexal pathology  10) Return in about 2 weeks (around 08/14/2019) for TVUS and follow up.    Vena Austria, MD Domingo Pulse, [facility] 07/31/2019, 11:11 AM

## 2019-08-01 LAB — ESTRADIOL: Estradiol: 13.8 pg/mL

## 2019-08-01 LAB — FOLLICLE STIMULATING HORMONE: FSH: 37.2 m[IU]/mL

## 2019-08-04 LAB — CYTOLOGY - PAP
Comment: NEGATIVE
High risk HPV: NEGATIVE

## 2019-08-05 ENCOUNTER — Encounter: Payer: Self-pay | Admitting: *Deleted

## 2019-08-05 ENCOUNTER — Other Ambulatory Visit: Payer: Self-pay | Admitting: Podiatry

## 2019-08-06 ENCOUNTER — Encounter
Admission: RE | Admit: 2019-08-06 | Discharge: 2019-08-06 | Disposition: A | Discharge status: Home / Self Care | Payer: 59 | Source: Ambulatory Visit | Attending: Podiatry | Admitting: Podiatry

## 2019-08-06 ENCOUNTER — Other Ambulatory Visit: Payer: Self-pay

## 2019-08-06 DIAGNOSIS — Z01818 Encounter for other preprocedural examination: Secondary | ICD-10-CM | POA: Insufficient documentation

## 2019-08-06 DIAGNOSIS — I10 Essential (primary) hypertension: Secondary | ICD-10-CM | POA: Diagnosis not present

## 2019-08-06 HISTORY — DX: Prediabetes: R73.03

## 2019-08-06 NOTE — Patient Instructions (Signed)
Your procedure is scheduled on: Friday August 15, 2019  Report to Day Surgery. To find out your arrival time please call [phone] between 1PM - 3PM on Thursday August 14, 2019.  Remember: Instructions that are not followed completely may result in serious medical risk,  up to and including death, or upon the discretion of your surgeon and anesthesiologist your  surgery may need to be rescheduled.     _X__ 1. Do not eat food after midnight the night before your procedure.                 No gum chewing or hard candies. You may drink clear liquids up to 2 hours                 before you are scheduled to arrive for your surgery- DO not drink clear                 liquids within 2 hours of the start of your surgery.                 Clear Liquids include:  water, apple juice without pulp, clear Gatorade, G2 or                  Gatorade Zero (avoid Red/Purple/Blue), Black Coffee or Tea (Do not add                 anything to coffee or tea).  __X__2.   Complete the carbohydrate drink provided to you, 2 hours before arrival.  __X__3.  On the morning of surgery brush your teeth with toothpaste and water, you                may rinse your mouth with mouthwash if you wish.  Do not swallow any toothpaste of mouthwash.     _X__ 4.  No Alcohol for 24 hours before or after surgery.   _X__ 5.  Do Not Smoke or use e-cigarettes For 24 Hours Prior to Your Surgery.                 Do not use any chewable tobacco products for at least 6 hours prior to                 Surgery.  _X__  6.  Do not use any recreational drugs (marijuana, cocaine, heroin, ecstacy, MDMA or other)                For at least one week prior to your surgery.  Combination of these drugs with anesthesia                May have life threatening results.   __X__  7.  Notify your doctor if there is any change in your medical condition      (cold, fever, infections).     Do not wear jewelry, make-up, hairpins,  clips or nail polish. Do not wear lotions, powders, or perfumes. You may wear deodorant. Do not shave 48 hours prior to surgery. Men may shave face and neck. Do not bring valuables to the hospital.    [facility] is not responsible for any belongings or valuables.  Contacts, dentures or bridgework may not be worn into surgery. Leave your suitcase in the car. After surgery it may be brought to your room. For patients admitted to the hospital, discharge time is determined by your treatment team.   Patients discharged the day of surgery will not  be allowed to drive home.   Make arrangements for someone to be with you for the first 24 hours of your Same Day Discharge.     ____ Take these medicines the morning of surgery with A SIP OF WATER:    1. None   __X__ Use CHG Soap (or wipes) as directed  __x__ Use inhalers on the day of surgery  fluticasone (FLONASE) 50 MCG/ACT nasal spray  __X__ Stop aspirin AS INSTRUCTED BY YOUR PROVIDER.  __X__ Stop Anti-inflammatories SUCH AS IBUPROFEN, ALEVE, NAPROXEN AND OR BC POWDERS.   __X__ Stop supplements until after surgery.    __X__ Do not start any herbal supplements before your surgery.

## 2019-08-08 ENCOUNTER — Encounter
Admission: RE | Admit: 2019-08-08 | Discharge: 2019-08-08 | Disposition: A | Discharge status: Home / Self Care | Payer: 59 | Source: Ambulatory Visit | Attending: Podiatry | Admitting: Podiatry

## 2019-08-08 ENCOUNTER — Other Ambulatory Visit: Payer: Self-pay

## 2019-08-08 DIAGNOSIS — Z01818 Encounter for other preprocedural examination: Secondary | ICD-10-CM | POA: Diagnosis not present

## 2019-08-08 LAB — CBC
HCT: 39.9 % (ref 36.0–46.0)
Hemoglobin: 12.7 g/dL (ref 12.0–15.0)
MCH: 26.1 pg (ref 26.0–34.0)
MCHC: 31.8 g/dL (ref 30.0–36.0)
MCV: 81.9 fL (ref 80.0–100.0)
Platelets: 377 10*3/uL (ref 150–400)
RBC: 4.87 MIL/uL (ref 3.87–5.11)
RDW: 16.5 % — ABNORMAL HIGH (ref 11.5–15.5)
WBC: 13.5 10*3/uL — ABNORMAL HIGH (ref 4.0–10.5)
nRBC: 0 % (ref 0.0–0.2)

## 2019-08-08 LAB — BASIC METABOLIC PANEL
Anion gap: 9 (ref 5–15)
BUN: 11 mg/dL (ref 6–20)
CO2: 27 mmol/L (ref 22–32)
Calcium: 9.1 mg/dL (ref 8.9–10.3)
Chloride: 105 mmol/L (ref 98–111)
Creatinine, Ser: 0.73 mg/dL (ref 0.44–1.00)
GFR calc Af Amer: >60 mL/min (ref >60)
GFR calc non Af Amer: >60 mL/min (ref >60)
Glucose, Bld: 91 mg/dL (ref 70–99)
Potassium: 4 mmol/L (ref 3.5–5.1)
Sodium: 141 mmol/L (ref 135–145)

## 2019-08-08 NOTE — Pre-Procedure Instructions (Signed)
Pre-Admit Testing Provider Notification Note  Provider Notified: Dr. Ether Griffins  Notification Mode: Fax  Reason: Abnormal lab result. (CBC)  Response: Fax confirmation received.   Additional Information: Placed on chart. Noted on Pre-Admit Worksheet.  Signed: Alvester Morin, RN

## 2019-08-13 ENCOUNTER — Encounter: Payer: Self-pay | Admitting: Obstetrics and Gynecology

## 2019-08-13 ENCOUNTER — Other Ambulatory Visit: Payer: Self-pay

## 2019-08-13 ENCOUNTER — Ambulatory Visit (INDEPENDENT_AMBULATORY_CARE_PROVIDER_SITE_OTHER): Payer: 59

## 2019-08-13 ENCOUNTER — Other Ambulatory Visit
Admission: RE | Admit: 2019-08-13 | Discharge: 2019-08-13 | Disposition: A | Discharge status: Home / Self Care | Payer: 59 | Source: Ambulatory Visit | Attending: Podiatry | Admitting: Podiatry

## 2019-08-13 ENCOUNTER — Ambulatory Visit (INDEPENDENT_AMBULATORY_CARE_PROVIDER_SITE_OTHER): Payer: 59 | Admitting: Obstetrics and Gynecology

## 2019-08-13 VITALS — BP 160/90 | Ht 62.0 in | Wt 234.0 lb

## 2019-08-13 DIAGNOSIS — R102 Pelvic and perineal pain: Secondary | ICD-10-CM | POA: Diagnosis not present

## 2019-08-13 DIAGNOSIS — R87612 Low grade squamous intraepithelial lesion on cytologic smear of cervix (LGSIL): Secondary | ICD-10-CM | POA: Diagnosis not present

## 2019-08-13 DIAGNOSIS — Z01812 Encounter for preprocedural laboratory examination: Secondary | ICD-10-CM | POA: Insufficient documentation

## 2019-08-13 DIAGNOSIS — Z20822 Contact with and (suspected) exposure to covid-19: Secondary | ICD-10-CM | POA: Insufficient documentation

## 2019-08-13 LAB — SARS CORONAVIRUS 2 (TAT 6-24 HRS): SARS Coronavirus 2: NEGATIVE

## 2019-08-13 NOTE — Progress Notes (Signed)
Gynecology Ultrasound Follow Up  Chief Complaint:  Chief Complaint  Patient presents with  . Follow-up     History of Present Illness: Patient is a 49 y.o. female who presents today for ultrasound evaluation of pelvic pain.  Ultrasound demonstrates the following findgins Adnexa: left ovary surgically absent right ovary normal Uterus: Non-enlarged with two small uterine fibroid <2cm with endometrial stripe 3.38mm Additional: IUD in proper position within the endometrial cavity  Review of Systems: Review of Systems  Constitutional: Negative.   Gastrointestinal: Positive for abdominal pain. Negative for constipation, diarrhea, nausea and vomiting.  Genitourinary: Negative.     Past Medical History:  Past Medical History:  Diagnosis Date  . 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 Allergy   . Anemia    Iron Deficiency  . Arthritis   . Astigmatism 08/21/2017   Dr. Marcellus Scott  . Baker cyst   . Breast mass, right 01/18/2010  . Cardiac murmur   . Chronic constipation   . Depression   . Dysmetabolic syndrome   . Fibromyalgia   . Hairiness   . Hydradenitis   . Hypertension   . Incarcerated ventral hernia 09/29/2011  . 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 Marcellus Scott  . Myopia 08/21/2017   Dr. Marcellus Scott - [facility]  . Obesity   . Obstructive sleep apnea    no CPAP  . Open-angle glaucoma of both eyes, mild stage 08/21/2017   Dr. Marcellus Scott - [facility]  . Plantar fasciitis   . Pre-diabetes   . Presbyopia 07/02/219   Dr. Marcellus Scott  . PVC (premature ventricular contraction)     Past Surgical History:  Past Surgical History:  Procedure Laterality Date  . FOOT ARTHRODESIS Right 12/01/2016   Procedure: ARTHRODESIS FOOT; TRIPLE;  Surgeon: [name], DPM;  Location: [facility] ORS;  Service: Podiatry;  Laterality: Right;  . HERNIA REPAIR  10/09/11   [facility] Incarcerated- Dr. [name]  .  OOPHORECTOMY Left 12/09/2009  . TUBAL LIGATION  1993  . VENTRAL HERNIA REPAIR  10/09/2011    Gynecologic History:  No LMP recorded. (Menstrual status: IUD). Contraception: IUD Last Pap:  07/31/2019 LGSIL HPV negative 11/28/2016 NIL HPV negative  Family History:  Family History  Problem Relation Age of Onset  . Hypertension Mother   . CVA Mother   . Kidney disease Mother   . Congenital heart disease Mother   . Heart disease Father   . Hypertension Father   . Diabetes Father   . Breast cancer Paternal Grandmother        Bilateral  . Colon cancer Maternal Grandfather   . Colon cancer Maternal Uncle     Social History:  Social History   Socioeconomic History  . Marital status: Married    Spouse name: Not on file  . Number of children: 3  . Years of education: Associates  . Highest education level: Associate degree: academic program  Occupational History  . Occupation: LPN at the [employer]  . Smoking status: Never Smoker  . Smokeless tobacco: Never Used  Vaping Use  . Vaping Use: Never used  Substance and Sexual Activity  . Alcohol use: No    Alcohol/week: 0.0 standard drinks  . Drug use: No  . Sexual activity: Yes    Partners: Male    Birth control/protection: I.U.D.  Other Topics Concern  . Not on file  Social History Narrative  Married Moses in 2016 in [place] and he finally got visa to move to [place] Nov 2019   Social Determinants of Health   Financial Resource Strain:   . Difficulty of Paying Living Expenses:   Food Insecurity:   . Worried About Programme researcher, broadcasting/film/video in the Last Year:   . Barista in the Last Year:   Transportation Needs:   . Freight forwarder (Medical):   Marland Kitchen Lack of Transportation (Non-Medical):   Physical Activity:   . Days of Exercise per Week:   . Minutes of Exercise per Session:   Stress:   . Feeling of Stress :   Social Connections:   . Frequency of Communication with Friends and Family:   .  Frequency of Social Gatherings with Friends and Family:   . Attends Religious Services:   . Active Member of Clubs or Organizations:   . Attends Banker Meetings:   Marland Kitchen Marital Status:   Intimate Partner Violence:   . Fear of Current or Ex-Partner:   . Emotionally Abused:   Marland Kitchen Physically Abused:   . Sexually Abused:     Allergies:  Allergies  Allergen Reactions  . Lisinopril Cough       . Norvasc [Amlodipine Besylate]     Medications: Prior to Admission medications   Medication Sig Start Date End Date Taking? Authorizing Provider  acetaminophen (TYLENOL) 500 MG tablet Take 1 tablet (500 mg total) by mouth every 6 (six) hours as needed. Max of 3 grams daily or 6 pills dialy Patient taking differently: Take 1,000 mg by mouth every 6 (six) hours as needed for moderate pain or headache.  06/18/15  Yes Sowles, Danna Hefty, MD  Armodafinil (NUVIGIL) 150 MG tablet Take 1 tablet (150 mg total) by mouth daily. Patient taking differently: Take 150 mg by mouth daily as needed (shift work--sleepiness).  04/15/19  Yes Alba Cory, MD  aspirin EC 81 MG tablet Take 1 tablet (81 mg total) by mouth daily. Patient taking differently: Take 81 mg by mouth every evening.  01/28/16  Yes Sowles, Danna Hefty, MD  buPROPion (WELLBUTRIN XL) 150 MG 24 hr tablet Take 1 tablet (150 mg total) by mouth daily. Patient taking differently: Take 150 mg by mouth every evening.  04/15/19  Yes Sowles, Danna Hefty, MD  cetirizine (ZYRTEC) 10 MG tablet Take 10 mg by mouth every evening.   Yes [provider]  Cholecalciferol (VITAMIN D) 2000 UNITS tablet Take 2,000 Units by mouth every evening.    Yes [provider]  clotrimazole-betamethasone (LOTRISONE) cream Apply 1 application topically 2 (two) times daily. Patient taking differently: Apply 1 application topically 2 (two) times daily as needed (skin irritation.).  06/07/18  Yes Sowles, Danna Hefty, MD  docusate sodium (COLACE) 100 MG capsule Take 100 mg  by mouth daily as needed for moderate constipation.    Yes [provider]  DULoxetine (CYMBALTA) 60 MG capsule Take 1 capsule (60 mg total) by mouth daily. Patient taking differently: Take 60 mg by mouth every evening.  04/15/19  Yes Sowles, Danna Hefty, MD  ferrous sulfate 325 (65 FE) MG tablet Take 325 mg by mouth every evening.    Yes [provider]  fluticasone (FLONASE) 50 MCG/ACT nasal spray Place 2 sprays into both nostrils daily as needed for allergies. 10/18/18  Yes Sowles, Danna Hefty, MD  ibuprofen (ADVIL) 200 MG tablet Take 200-600 mg by mouth every 6 (six) hours as needed (for pain.).   Yes [provider]  levonorgestrel (  MIRENA, 52 MG,) 20 MCG/24HR IUD 1 each by Intrauterine route once.    Yes [provider]  metaxalone (SKELAXIN) 800 MG tablet TAKE 1 TABLET BY MOUTH 3 TIMES DAILY AS NEEDED FOR MUSCLE SPASMS. Patient taking differently: Take 800 mg by mouth 3 (three) times daily as needed for muscle spasms.  06/24/19  Yes Sowles, Drue Stager, MD  Multiple Vitamin (MULTIVITAMIN WITH MINERALS) TABS tablet Take 1 tablet by mouth every evening.   Yes [provider]  nystatin (MYCOSTATIN/NYSTOP) powder Apply 1 application topically 2 (two) times daily as needed. 07/31/19  Yes Malachy Mood, MD  olmesartan (BENICAR) 40 MG tablet Take 1 tablet (40 mg total) by mouth daily. Patient taking differently: Take 40 mg by mouth every evening.  04/15/19  Yes Sowles, Drue Stager, MD  TRAVATAN Z 0.004 % SOLN ophthalmic solution Place 1 drop into both eyes at bedtime. 08/21/17  Yes Pa, [employer]    Physical Exam Vitals: Blood pressure (!) 160/90, height 5\' 2"  (1.575 m), weight 234 lb (106.1 kg).  General: NAD HEENT: normocephalic, anicteric Pulmonary: No increased work of breathing Extremities: no edema, erythema, or tenderness Neurologic: Grossly intact, normal gait Psychiatric: mood appropriate, affect full   Assessment: 49 y.o. G3P3 follow up pelvic  pain Plan: Problem List Items Addressed This Visit    None      1) Normal ultrasound without evidence of gyn pathology to explain patient intermittent pain.  At present pain is not significant enough that patient opts for expectant management.   We discussed possibility of ortho referral to evaluate for referred MSK pain from the hip and no other GI or GU symptomatology.  2) LGSIL pap HPV negative.  - Discussed repeat pap in 12 months.    3) Mirena IUD - FSH/estradiol appear to be approaching menopause.  We discussed fluctuations in these labs and that there may still be some residual ovarian function and this would not guaranteed any bleeding issues going forward if we were to remove the IUD.  I would repeat FSH/estradiol next year and if further into menopausal range then removal would be considered  4) A total of 15 minutes were spent in face-to-face contact with the patient during this encounter with over half of that time devoted to counseling and coordination of care.  5) Return in about 1 year (around 08/12/2020) for annual.     Malachy Mood, MD, [place], [facility] 08/13/2019, 2:35 PM

## 2019-08-15 ENCOUNTER — Encounter: Payer: Self-pay | Admitting: Podiatry

## 2019-08-15 ENCOUNTER — Ambulatory Visit
Admission: RE | Admit: 2019-08-15 | Discharge: 2019-08-15 | Disposition: A | Discharge status: Home / Self Care | Payer: 59 | Source: Ambulatory Visit | Attending: Podiatry | Admitting: Podiatry

## 2019-08-15 ENCOUNTER — Encounter
Admission: RE | Disposition: A | Discharge status: Home / Self Care | Payer: Self-pay | Source: Ambulatory Visit | Attending: Podiatry

## 2019-08-15 ENCOUNTER — Ambulatory Visit: Payer: 59

## 2019-08-15 ENCOUNTER — Ambulatory Visit: Payer: 59 | Admitting: Anesthesiology

## 2019-08-15 ENCOUNTER — Other Ambulatory Visit: Payer: Self-pay

## 2019-08-15 DIAGNOSIS — M79673 Pain in unspecified foot: Secondary | ICD-10-CM

## 2019-08-15 DIAGNOSIS — M76822 Posterior tibial tendinitis, left leg: Secondary | ICD-10-CM | POA: Diagnosis not present

## 2019-08-15 DIAGNOSIS — Z793 Long term (current) use of hormonal contraceptives: Secondary | ICD-10-CM | POA: Insufficient documentation

## 2019-08-15 DIAGNOSIS — Z7982 Long term (current) use of aspirin: Secondary | ICD-10-CM | POA: Insufficient documentation

## 2019-08-15 DIAGNOSIS — Z419 Encounter for procedure for purposes other than remedying health state, unspecified: Secondary | ICD-10-CM

## 2019-08-15 DIAGNOSIS — M25572 Pain in left ankle and joints of left foot: Secondary | ICD-10-CM | POA: Insufficient documentation

## 2019-08-15 DIAGNOSIS — Z6841 Body Mass Index (BMI) 40.0 and over, adult: Secondary | ICD-10-CM | POA: Diagnosis not present

## 2019-08-15 DIAGNOSIS — G4730 Sleep apnea, unspecified: Secondary | ICD-10-CM | POA: Insufficient documentation

## 2019-08-15 DIAGNOSIS — Z79899 Other long term (current) drug therapy: Secondary | ICD-10-CM | POA: Insufficient documentation

## 2019-08-15 DIAGNOSIS — I10 Essential (primary) hypertension: Secondary | ICD-10-CM | POA: Diagnosis not present

## 2019-08-15 DIAGNOSIS — D649 Anemia, unspecified: Secondary | ICD-10-CM | POA: Diagnosis not present

## 2019-08-15 DIAGNOSIS — M79672 Pain in left foot: Secondary | ICD-10-CM | POA: Insufficient documentation

## 2019-08-15 HISTORY — PX: ACHILLES TENDON SURGERY: SHX542

## 2019-08-15 LAB — POCT PREGNANCY, URINE: Preg Test, Ur: NEGATIVE

## 2019-08-15 SURGERY — TENOTOMY, ACHILLES
Anesthesia: General | Laterality: Left

## 2019-08-15 MED ORDER — EPINEPHRINE PF 1 MG/ML IJ SOLN
INTRAMUSCULAR | Status: AC
  Filled 2019-08-15: qty 1

## 2019-08-15 MED ORDER — FENTANYL CITRATE (PF) 100 MCG/2ML IJ SOLN
50.0000 ug | Freq: Once | INTRAMUSCULAR | Status: AC
  Administered 2019-08-15: 50 ug via INTRAVENOUS

## 2019-08-15 MED ORDER — LIDOCAINE-EPINEPHRINE 1 %-1:100000 IJ SOLN
INTRAMUSCULAR | Status: DC | PRN
Start: 2019-08-15 — End: 2019-08-15
  Administered 2019-08-15: 20 mL

## 2019-08-15 MED ORDER — LIDOCAINE HCL (CARDIAC) PF 100 MG/5ML IV SOSY
PREFILLED_SYRINGE | INTRAVENOUS | Status: DC | PRN
  Administered 2019-08-15: 100 mg via INTRAVENOUS

## 2019-08-15 MED ORDER — FENTANYL CITRATE (PF) 100 MCG/2ML IJ SOLN
INTRAMUSCULAR | Status: DC | PRN
  Administered 2019-08-15: 100 ug via INTRAVENOUS

## 2019-08-15 MED ORDER — SUGAMMADEX SODIUM 200 MG/2ML IV SOLN
INTRAVENOUS | Status: DC | PRN
  Administered 2019-08-15: 200 mg via INTRAVENOUS

## 2019-08-15 MED ORDER — CEFAZOLIN SODIUM-DEXTROSE 2-4 GM/100ML-% IV SOLN
2.0000 g | INTRAVENOUS | Status: AC
  Administered 2019-08-15: 2 g via INTRAVENOUS

## 2019-08-15 MED ORDER — LACTATED RINGERS IV SOLN: INTRAVENOUS | Status: DC

## 2019-08-15 MED ORDER — CEFAZOLIN SODIUM-DEXTROSE 2-4 GM/100ML-% IV SOLN
INTRAVENOUS | Status: AC
  Filled 2019-08-15: qty 100

## 2019-08-15 MED ORDER — ONDANSETRON HCL 4 MG/2ML IJ SOLN
INTRAMUSCULAR | Status: AC
  Filled 2019-08-15: qty 2

## 2019-08-15 MED ORDER — MIDAZOLAM HCL 2 MG/2ML IJ SOLN
1.0000 mg | Freq: Once | INTRAMUSCULAR | Status: AC
  Administered 2019-08-15: 1 mg via INTRAVENOUS

## 2019-08-15 MED ORDER — ROCURONIUM BROMIDE 100 MG/10ML IV SOLN
INTRAVENOUS | Status: DC | PRN
  Administered 2019-08-15: 50 mg via INTRAVENOUS

## 2019-08-15 MED ORDER — POVIDONE-IODINE 7.5 % EX SOLN
Freq: Once | CUTANEOUS | Status: DC
  Filled 2019-08-15: qty 118

## 2019-08-15 MED ORDER — ROPIVACAINE HCL 5 MG/ML IJ SOLN
INTRAMUSCULAR | Status: DC | PRN
Start: 2019-08-15 — End: 2019-08-15
  Administered 2019-08-15: 30 mL via EPIDURAL

## 2019-08-15 MED ORDER — ORAL CARE MOUTH RINSE: 15.0000 mL | Freq: Once | OROMUCOSAL | Status: AC

## 2019-08-15 MED ORDER — LIDOCAINE HCL (PF) 2 % IJ SOLN
INTRAMUSCULAR | Status: AC
  Filled 2019-08-15: qty 5

## 2019-08-15 MED ORDER — DEXMEDETOMIDINE HCL IN NACL 200 MCG/50ML IV SOLN
INTRAVENOUS | Status: DC | PRN
  Administered 2019-08-15: 4 ug via INTRAVENOUS
  Administered 2019-08-15: 8 ug via INTRAVENOUS

## 2019-08-15 MED ORDER — PHENYLEPHRINE HCL (PRESSORS) 10 MG/ML IV SOLN
INTRAVENOUS | Status: DC | PRN
  Administered 2019-08-15: 100 ug via INTRAVENOUS

## 2019-08-15 MED ORDER — LIDOCAINE HCL (PF) 1 % IJ SOLN
INTRAMUSCULAR | Status: AC
  Filled 2019-08-15: qty 5

## 2019-08-15 MED ORDER — FAMOTIDINE 20 MG PO TABS
ORAL_TABLET | ORAL | Status: AC
  Administered 2019-08-15: 20 mg via ORAL
  Filled 2019-08-15: qty 1

## 2019-08-15 MED ORDER — KETOROLAC TROMETHAMINE 30 MG/ML IJ SOLN
INTRAMUSCULAR | Status: DC | PRN
  Administered 2019-08-15: 30 mg via INTRAVENOUS

## 2019-08-15 MED ORDER — CHLORHEXIDINE GLUCONATE 0.12 % MT SOLN: 15.0000 mL | Freq: Once | OROMUCOSAL | Status: AC

## 2019-08-15 MED ORDER — ROPIVACAINE HCL 5 MG/ML IJ SOLN
INTRAMUSCULAR | Status: AC
  Filled 2019-08-15: qty 30

## 2019-08-15 MED ORDER — MIDAZOLAM HCL 2 MG/2ML IJ SOLN
INTRAMUSCULAR | Status: AC
  Administered 2019-08-15: 1 mg via INTRAVENOUS
  Filled 2019-08-15: qty 2

## 2019-08-15 MED ORDER — PROPOFOL 10 MG/ML IV BOLUS
INTRAVENOUS | Status: DC | PRN
  Administered 2019-08-15: 150 mg via INTRAVENOUS

## 2019-08-15 MED ORDER — MIDAZOLAM HCL 2 MG/2ML IJ SOLN
INTRAMUSCULAR | Status: DC | PRN
  Administered 2019-08-15: 2 mg via INTRAVENOUS

## 2019-08-15 MED ORDER — SUCCINYLCHOLINE CHLORIDE 20 MG/ML IJ SOLN
INTRAMUSCULAR | Status: DC | PRN
  Administered 2019-08-15: 100 mg via INTRAVENOUS

## 2019-08-15 MED ORDER — ONDANSETRON HCL 4 MG/2ML IJ SOLN
INTRAMUSCULAR | Status: DC | PRN
  Administered 2019-08-15: 4 mg via INTRAVENOUS

## 2019-08-15 MED ORDER — MIDAZOLAM HCL 2 MG/2ML IJ SOLN
INTRAMUSCULAR | Status: AC
  Filled 2019-08-15: qty 2

## 2019-08-15 MED ORDER — FAMOTIDINE 20 MG PO TABS: 20.0000 mg | ORAL_TABLET | Freq: Once | ORAL | Status: AC

## 2019-08-15 MED ORDER — FENTANYL CITRATE (PF) 100 MCG/2ML IJ SOLN
INTRAMUSCULAR | Status: AC
  Administered 2019-08-15: 50 ug via INTRAVENOUS
  Filled 2019-08-15: qty 2

## 2019-08-15 MED ORDER — ROCURONIUM BROMIDE 10 MG/ML (PF) SYRINGE
PREFILLED_SYRINGE | INTRAVENOUS | Status: AC
  Filled 2019-08-15: qty 10

## 2019-08-15 MED ORDER — DEXAMETHASONE SODIUM PHOSPHATE 10 MG/ML IJ SOLN
INTRAMUSCULAR | Status: AC
  Filled 2019-08-15: qty 1

## 2019-08-15 MED ORDER — CHLORHEXIDINE GLUCONATE 0.12 % MT SOLN
OROMUCOSAL | Status: AC
  Administered 2019-08-15: 15 mL via OROMUCOSAL
  Filled 2019-08-15: qty 15

## 2019-08-15 MED ORDER — PROPOFOL 10 MG/ML IV BOLUS
INTRAVENOUS | Status: AC
  Filled 2019-08-15: qty 40

## 2019-08-15 MED ORDER — OXYCODONE-ACETAMINOPHEN 5-325 MG PO TABS: 1.0000 | ORAL_TABLET | Freq: Four times a day (QID) | ORAL | 0 refills | Status: DC | PRN

## 2019-08-15 MED ORDER — LIDOCAINE HCL (PF) 1 % IJ SOLN
INTRAMUSCULAR | Status: DC | PRN
Start: 2019-08-15 — End: 2019-08-15
  Administered 2019-08-15 (×2): .8 mL via SUBCUTANEOUS

## 2019-08-15 MED ORDER — SEVOFLURANE IN SOLN
RESPIRATORY_TRACT | Status: AC
  Filled 2019-08-15: qty 250

## 2019-08-15 MED ORDER — FENTANYL CITRATE (PF) 100 MCG/2ML IJ SOLN
INTRAMUSCULAR | Status: AC
  Filled 2019-08-15: qty 2

## 2019-08-15 SURGICAL SUPPLY — 77 items
BIT DRILL 2 FENESTRATED (MISCELLANEOUS) IMPLANT
BIT DRILL CANNULATED 4.6 (BIT) ×1 IMPLANT
BIT DRILLL 2 FENESTRATED (MISCELLANEOUS) ×2
BLADE SURG 15 STRL LF DISP TIS (BLADE) ×2 IMPLANT
BLADE SURG 15 STRL SS (BLADE) ×4
BLADE SURG MINI STRL (BLADE) ×2 IMPLANT
BNDG CMPR STD VLCR NS LF 5.8X4 (GAUZE/BANDAGES/DRESSINGS) ×2
BNDG CONFORM 2 STRL LF (GAUZE/BANDAGES/DRESSINGS) ×1 IMPLANT
BNDG CONFORM 3 STRL LF (GAUZE/BANDAGES/DRESSINGS) ×2 IMPLANT
BNDG ELASTIC 4X5.8 VLCR NS LF (GAUZE/BANDAGES/DRESSINGS) ×4 IMPLANT
BNDG ESMARK 4X12 TAN STRL LF (GAUZE/BANDAGES/DRESSINGS) ×2 IMPLANT
BNDG ESMARK 6X12 TAN STRL LF (GAUZE/BANDAGES/DRESSINGS) IMPLANT
BUR 4X45 EGG (BURR) ×1 IMPLANT
CANISTER SUCT 1200ML W/VALVE (MISCELLANEOUS) ×2 IMPLANT
CNTNR SPEC 2.5X3XGRAD LEK (MISCELLANEOUS) ×1
CONT SPEC 4OZ STER OR WHT (MISCELLANEOUS) ×1
CONT SPEC 4OZ STRL OR WHT (MISCELLANEOUS) ×1
CONTAINER SPEC 2.5X3XGRAD LEK (MISCELLANEOUS) IMPLANT
COUNTERSINK 7.0 (MISCELLANEOUS) ×2
COVER WAND RF STERILE (DRAPES) ×2 IMPLANT
DRAPE C-ARMOR (DRAPES) ×1 IMPLANT
DRAPE FLUOR MINI C-ARM 54X84 (DRAPES) ×1 IMPLANT
DURAPREP 26ML APPLICATOR (WOUND CARE) ×2 IMPLANT
ELECT REM PT RETURN 9FT ADLT (ELECTROSURGICAL) ×2
ELECTRODE REM PT RTRN 9FT ADLT (ELECTROSURGICAL) ×1 IMPLANT
GAUZE SPONGE 4X4 12PLY STRL (GAUZE/BANDAGES/DRESSINGS) ×2 IMPLANT
GAUZE XEROFORM 1X8 LF (GAUZE/BANDAGES/DRESSINGS) ×2 IMPLANT
GLOVE BIO SURGEON STRL SZ7.5 (GLOVE) ×2 IMPLANT
GLOVE INDICATOR 8.0 STRL GRN (GLOVE) ×2 IMPLANT
GOWN STRL REUS W/ TWL LRG LVL4 (GOWN DISPOSABLE) ×2 IMPLANT
GOWN STRL REUS W/TWL LRG LVL4 (GOWN DISPOSABLE) ×4
GRAFT TRIN ELITE MED MUSC TRAN (Graft) ×1 IMPLANT
HANDLE YANKAUER SUCT BULB TIP (MISCELLANEOUS) ×2 IMPLANT
K-WIRE SINGLE TROCAR 2.3X230 (WIRE) ×16
KIT PROCEDURE DRILL (DRILL) ×1 IMPLANT
KIT TURNOVER KIT A (KITS) ×2 IMPLANT
KWIRE SINGLE TROCAR 2.3X230 (WIRE) IMPLANT
LABEL OR SOLS (LABEL) ×2 IMPLANT
NDL FILTER BLUNT 18X1 1/2 (NEEDLE) ×1 IMPLANT
NDL HYPO 25X1 1.5 SAFETY (NEEDLE) ×3 IMPLANT
NDL MAYO CATGUT SZ5 (NEEDLE)
NDL SUT 5 .5 CRC TPR PNT MAYO (NEEDLE) IMPLANT
NEEDLE FILTER BLUNT 18X 1/2SAF (NEEDLE) ×1
NEEDLE FILTER BLUNT 18X1 1/2 (NEEDLE) ×1 IMPLANT
NEEDLE HYPO 25X1 1.5 SAFETY (NEEDLE) ×6 IMPLANT
NS IRRIG 500ML POUR BTL (IV SOLUTION) ×5 IMPLANT
PACK EXTREMITY (MISCELLANEOUS) ×2 IMPLANT
PAD CAST CTTN 4X4 STRL (SOFTGOODS) ×1 IMPLANT
PADDING CAST COTTON 4X4 STRL (SOFTGOODS) ×2
RASP SM TEAR CROSS CUT (RASP) ×1 IMPLANT
SCREW COUNTERSINK 7.0 (MISCELLANEOUS) IMPLANT
SCREW SHORT THRD 7.0X70X17 (Screw) ×1 IMPLANT
SPLINT CAST 1 STEP 5X30 WHT (MISCELLANEOUS) ×1 IMPLANT
SPLINT FAST PLASTER 5X30 (CAST SUPPLIES) ×1
SPLINT PLASTER CAST FAST 5X30 (CAST SUPPLIES) ×1 IMPLANT
SPONGE LAP 18X18 RF (DISPOSABLE) ×2 IMPLANT
STAPLE DYNACLIP 14X14X14 (Staple) ×2 IMPLANT
STAPLE DYNACLIP 18X18X18 (Staple) ×2 IMPLANT
STAPLE DYNACLIP 20X18X18 (Staple) ×2 IMPLANT
STOCKINETTE M/LG 89821 (MISCELLANEOUS) ×2 IMPLANT
STRIP CLOSURE SKIN 1/2X4 (GAUZE/BANDAGES/DRESSINGS) ×2 IMPLANT
SUT ETHILON 3-0 FS-10 30 BLK (SUTURE) ×4
SUT MNCRL+ 5-0 VIOLET P-3 (SUTURE) ×1 IMPLANT
SUT MONOCRYL 5-0 (SUTURE)
SUT PDS AB 0 CT1 27 (SUTURE) IMPLANT
SUT VIC AB 0 SH 27 (SUTURE) ×1 IMPLANT
SUT VIC AB 2-0 SH 27 (SUTURE) ×6
SUT VIC AB 2-0 SH 27XBRD (SUTURE) ×2 IMPLANT
SUT VIC AB 3-0 SH 27 (SUTURE) ×4
SUT VIC AB 3-0 SH 27X BRD (SUTURE) ×1 IMPLANT
SUT VIC AB 4-0 FS2 27 (SUTURE) ×1 IMPLANT
SUT VICRYL AB 3-0 FS1 BRD 27IN (SUTURE) ×1 IMPLANT
SUTURE EHLN 3-0 FS-10 30 BLK (SUTURE) IMPLANT
SWABSTK COMLB BENZOIN TINCTURE (MISCELLANEOUS) ×2 IMPLANT
SYR 10ML LL (SYRINGE) ×4 IMPLANT
SYR 3ML LL SCALE MARK (SYRINGE) ×2 IMPLANT
WIRE MAGNUM (SUTURE) ×1 IMPLANT

## 2019-08-15 NOTE — Anesthesia Postprocedure Evaluation (Signed)
Anesthesia Post Note  Patient: Kathleen Conley  Procedure(s) Performed: TRIPLE ARTHRODESIS AND ACHILLES TENDON LENGTHENING LEFT FOOT (Left )  Patient location during evaluation: PACU Anesthesia Type: General Level of consciousness: awake and alert Pain management: pain level controlled Vital Signs Assessment: post-procedure vital signs reviewed and stable Respiratory status: spontaneous breathing and respiratory function stable Cardiovascular status: stable Anesthetic complications: no   No complications documented.   Last Vitals:  Vitals:   08/15/19 1238 08/15/19 1253  BP: (!) 144/82 (!) 149/77  Pulse: 98 94  Resp: 20 16  Temp: 36.5 C (!) 36.3 C  SpO2: 92% 96%    Last Pain:  Vitals:   08/15/19 1253  TempSrc: Temporal  PainSc: 0-No pain                 Kobyn Kray K

## 2019-08-15 NOTE — Op Note (Signed)
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 Percutaneous tendo Achilles lengthening 2. Triple arthrodesis left foot    EBL: Minimal    Anesthesia:regional and general. Patient underwent popliteal block in the preoperative holding area.    Hemostasis: Mid calf tourniquet inflated to 200 mmHg for 120 minutes    Specimen: None    Complications: None    Operative indications:Kathleen Conley is an 49 y.o. that presents today for surgical intervention.  The risks/benefits/alternatives/complications have been discussed and consent has been given.    Procedure:  Patient was brought into the OR and placed on the operating table in thesupine position. After anesthesia was obtained theleft lower extremity was prepped and draped in usual sterile fashion.  Attention was directed to the posterior aspect of the Achilles insertion where at 1 3 and 5 cm from its insertion 3 hemisections were placed. The Achilles tendon was noted to lengthen. The incisions were then closed with a 3-0 nylon.  The tourniquet was then inflated. Attention was directed laterally at the distal tip of the fibula coursing over the calcaneocuboid joint incision was performed. Sharp and blunt dissection carried down through the fascial layer. The EDB muscle belly was noted and reflected dorsally. The subtalar joint and calcaneocuboid joint were then exposed. Care was taken to retract the peroneal tendons throughout the procedure. Next all articular cartilage was removed from the posterior middle and anterior facet of the subtalar joint. All articular cartilage was removed from the calcaneocuboid joint.  Attention was then directed to the anteromedial aspect where an incision was made just medial to the anterior tibial tendon. Sharp and blunt dissection carried down  to the periosteum. Subperiosteal dissection was taken medial and lateral exposing the entire talonavicular joint. All articular cartilage was then removed.  At this time all joints were prepared and drilled with a 2.0  millimeter drill bit. The subtalar joint was the first joint stabilized. A guidewire for the 7.0 mm compression screws was placed through the posterior heel crossing the subtalar joint. Excellent alignment and compression was noted. The calcaneus was noted in a neutral position. A large screw was then placed crossing the subtalar joint with excellent compression. Attention was then directed to the talonavicular joint where 3 compression bone staples were placed. Attention was then directed laterally at the calcaneocuboid joint where 3 compression bone staples were placed. Good alignment was noted in all planes. Prior to final fixation of all joints the areas were packed with Trinity bone graft. All wounds were then flushed and closure was performed with a combination of 2-0 Vicryl 3-0 Vicryl and a 3-0 nylon. A bulky sterile dressing was applied. The patient was then placed in a posterior splint.    Patient tolerated the procedure and anesthesia well.  Was transported from the OR to the PACU with all vital signs stable and vascular status intact. To be discharged per routine protocol.  Will follow up in approximately 1 week in the outpatient clinic.

## 2019-08-15 NOTE — Anesthesia Procedure Notes (Signed)
Anesthesia Regional Block: Popliteal block   Pre-Anesthetic Checklist: ,, timeout performed, Correct Patient, Correct Site, Correct Laterality, Correct Procedure, Correct Position, site marked, Risks and benefits discussed,  Surgical consent,  Pre-op evaluation,  At surgeon's request and post-op pain management  Laterality: Lower and Left  Prep: chloraprep       Needles:  Injection technique: Single-shot  Needle Type: Echogenic Needle     Needle Length: 9cm  Needle Gauge: 21     Additional Needles:   Procedures:, nerve stimulator,,, ultrasound used (permanent image in chart),,,,   Nerve Stimulator or Paresthesia:  Response: 0.8 mA,   Additional Responses:   Narrative:  Start time: 08/15/2019 7:19 AM End time: 08/15/2019 7:33 AM Injection made incrementally with aspirations every 5 mL.  Performed by: Personally  Anesthesiologist: Piscitello, Cleda Mccreedy, MD  Additional Notes: Patient consented for risk and benefits of nerve block including but not limited to nerve damage, failed block, bleeding and infection.  Patient voiced understanding.  Functioning IV was confirmed and monitors were applied.  Timeout done prior to procedure and prior to any sedation being given to the patient.  Patient confirmed procedure site prior to any sedation given to the patient.  A 49mm 22ga Stimuplex needle was used. Sterile prep,hand hygiene was used.  Minimal sedation used for procedure.  No paresthesia endorsed by patient during the procedure.  Negative aspiration and negative test dose prior to incremental administration of local anesthetic. The patient tolerated the procedure well with no immediate complications.

## 2019-08-15 NOTE — Anesthesia Procedure Notes (Signed)
Procedure Name: Intubation Date/Time: 08/15/2019 8:08 AM Performed by: Manning Charity, CRNA Pre-anesthesia Checklist: Patient identified, Emergency Drugs available, Suction available and Patient being monitored Patient Re-evaluated:Patient Re-evaluated prior to induction Oxygen Delivery Method: Circle system utilized Preoxygenation: Pre-oxygenation with 100% oxygen Induction Type: IV induction Ventilation: Mask ventilation without difficulty Laryngoscope Size: McGraph and 4 Grade View: Grade I Tube type: Oral Tube size: 7.0 mm Number of attempts: 1 Airway Equipment and Method: Stylet Placement Confirmation: ETT inserted through vocal cords under direct vision,  positive ETCO2 and breath sounds checked- equal and bilateral Secured at: 20 cm Tube secured with: Tape Dental Injury: Teeth and Oropharynx as per pre-operative assessment

## 2019-08-15 NOTE — H&P (Signed)
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  The various methods of treatment have been discussed with the patient.  No guarantees were given.  After consideration of risks, benefits and other options for treatment, the patient has consented to surgery.  I have reviewed the patients' chart and labs.     A history and physical examination was performed in my office.  The patient was reexamined.  There have been no changes to this history and physical examination.  [name] A

## 2019-08-15 NOTE — Transfer of Care (Signed)
Immediate Anesthesia Transfer of Care Note  Patient: Kathleen Conley  Procedure(s) Performed: TRIPLE ARTHRODESIS AND ACHILLES TENDON LENGTHENING LEFT FOOT (Left )  Patient Location: PACU  Anesthesia Type:General  Level of Consciousness: sedated  Airway & Oxygen Therapy: Patient Spontanous Breathing and Patient connected to face mask oxygen  Post-op Assessment: Report given to RN and Post -op Vital signs reviewed and stable  Post vital signs: Reviewed  Last Vitals:  Vitals Value Taken Time  BP    Temp    Pulse 89 08/15/19 1122  Resp 13 08/15/19 1122  SpO2 97 % 08/15/19 1122  Vitals shown include unvalidated device data.  Last Pain:  Vitals:   08/15/19 0719  TempSrc:   PainSc: 3          Complications: No complications documented.

## 2019-08-15 NOTE — Discharge Instructions (Signed)
[facility]  POST OPERATIVE INSTRUCTIONS FOR DR. TROXLER, DR. Ether Griffins, AND [facility]   1. Take your medication as prescribed.  Pain medication should be taken only as needed.  2. Keep the dressing clean, dry and intact.  3. Keep your foot elevated above the heart level for the first 48 hours.  4. Walking to the bathroom and brief periods of walking are acceptable, unless we have instructed you to be non-weight bearing.  5. Always use your crutches or scooter.   You are to be non-weight bearing.  6. Do not take a shower. Baths are permissible as long as the foot is kept out of the water.   7. Every hour you are awake:  - Bend your knee 15 times.  8. Call [facility] [phone]) if any of the following problems occur: - You develop a temperature or fever. - The bandage becomes saturated with blood. - Medication does not stop your pain. - Injury of the foot occurs. - Any symptoms of infection including redness, odor, or red streaks running from wound.   AMBULATORY SURGERY  DISCHARGE INSTRUCTIONS   1) The drugs that you were given will stay in your system until tomorrow so for the next 24 hours you should not:  A) Drive an automobile B) Make any legal decisions C) Drink any alcoholic beverage   2) You may resume regular meals tomorrow.  Today it is better to start with liquids and gradually work up to solid foods.  You may eat anything you prefer, but it is better to start with liquids, then soup and crackers, and gradually work up to solid foods.   3) Please notify your doctor immediately if you have any unusual bleeding, trouble breathing, redness and pain at the surgery site, drainage, fever, or pain not relieved by medication.    4) Additional Instructions:        Please contact your physician with any problems or Same Day Surgery at [phone], Monday through Friday 6 am  to 4 pm, or [place] at [facility] number at [phone].

## 2019-08-15 NOTE — Progress Notes (Signed)
Glasses returned to patient.

## 2019-08-15 NOTE — Anesthesia Preprocedure Evaluation (Signed)
Anesthesia Evaluation  Patient identified by MRN, date of birth, ID band Patient awake    Reviewed: Allergy & Precautions, NPO status , Patient's Chart, lab work & pertinent test results  History of Anesthesia Complications Negative for: history of anesthetic complications  Airway Mallampati: III       Dental   Pulmonary sleep apnea (does not require CPAP) , neg COPD, Not current smoker,           Cardiovascular hypertension, Pt. on medications (-) Past MI and (-) CHF (-) dysrhythmias + Valvular Problems/Murmurs ("innocent murmur")      Neuro/Psych neg Seizures Depression    GI/Hepatic Neg liver ROS, neg GERD  ,  Endo/Other  neg diabetesMorbid obesity  Renal/GU negative Renal ROS     Musculoskeletal   Abdominal (+) + obese,   Peds  Hematology  (+) anemia ,   Anesthesia Other Findings   Reproductive/Obstetrics                             Anesthesia Physical Anesthesia Plan  ASA: III  Anesthesia Plan: General   Post-op Pain Management:    Induction: Intravenous  PONV Risk Score and Plan: 3 and Ondansetron, Dexamethasone and Midazolam  Airway Management Planned: Oral ETT  Additional Equipment:   Intra-op Plan:   Post-operative Plan:   Informed Consent: I have reviewed the patients History and Physical, chart, labs and discussed the procedure including the risks, benefits and alternatives for the proposed anesthesia with the patient or authorized representative who has indicated his/her understanding and acceptance.       Plan Discussed with:   Anesthesia Plan Comments:         Anesthesia Quick Evaluation

## 2019-08-22 ENCOUNTER — Encounter: Payer: 59 | Admitting: Family Medicine

## 2019-10-10 NOTE — Progress Notes (Signed)
No show

## 2019-10-13 ENCOUNTER — Ambulatory Visit: Payer: 59 | Admitting: Family Medicine

## 2019-10-13 DIAGNOSIS — M797 Fibromyalgia: Secondary | ICD-10-CM

## 2019-10-13 DIAGNOSIS — F330 Major depressive disorder, recurrent, mild: Secondary | ICD-10-CM

## 2019-11-04 ENCOUNTER — Ambulatory Visit: Payer: 59 | Attending: Podiatry | Admitting: Physical Therapy

## 2019-11-04 ENCOUNTER — Other Ambulatory Visit: Payer: Self-pay

## 2019-11-04 DIAGNOSIS — M25572 Pain in left ankle and joints of left foot: Secondary | ICD-10-CM | POA: Insufficient documentation

## 2019-11-04 DIAGNOSIS — R262 Difficulty in walking, not elsewhere classified: Secondary | ICD-10-CM | POA: Insufficient documentation

## 2019-11-04 DIAGNOSIS — M6281 Muscle weakness (generalized): Secondary | ICD-10-CM

## 2019-11-04 NOTE — Therapy (Signed)
[facility] [address], [place], [address] Phone: [phone]   Fax:  [phone]  Physical Therapy Evaluation  Patient Details  Name: Kathleen Conley MRN: [id number] Date of Birth: Jan 07, 1971 Referring Provider (PT):  [name], DPM   Encounter Date: 11/04/2019   PT End of Session - 11/04/19 1734    Visit Number 1    Number of Visits 24    Date for PT Re-Evaluation 01/27/20    Authorization Type Self pay reporting period from 11/04/2019    Progress Note Due on Visit 10    PT Start Time 1605    PT Stop Time 1705    PT Time Calculation (min) 60 min    Equipment Utilized During Treatment Other (comment)   axial crutches and left cam boot   Activity Tolerance Patient tolerated treatment well    Behavior During Therapy [facility] for tasks assessed/performed           Past Medical History:  Diagnosis Date  . 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 Allergy   . Anemia    Iron Deficiency  . Arthritis   . Astigmatism 08/21/2017   Dr. Marcellus Scott  . Baker cyst   . Breast mass, right 01/18/2010  . Cardiac murmur   . Chronic constipation   . Depression   . Dysmetabolic syndrome   . Fibromyalgia   . Hairiness   . Hydradenitis   . Hypertension   . Incarcerated ventral hernia 09/29/2011  . 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 Marcellus Scott  . Myopia 08/21/2017   Dr. Marcellus Scott - [facility]  . Obesity   . Obstructive sleep apnea    no CPAP  . Open-angle glaucoma of both eyes, mild stage 08/21/2017   Dr. Marcellus Scott - [facility]  . Plantar fasciitis   . Pre-diabetes   . Presbyopia 07/02/219   Dr. Marcellus Scott  . PVC (premature ventricular contraction)     Past Surgical History:  Procedure Laterality Date  . ACHILLES TENDON SURGERY Left 08/15/2019   Procedure: TRIPLE ARTHRODESIS AND ACHILLES TENDON LENGTHENING LEFT FOOT;   Surgeon: [name], DPM;  Location: [facility] ORS;  Service: Podiatry;  Laterality: Left;  . FOOT ARTHRODESIS Right 12/01/2016   Procedure: ARTHRODESIS FOOT; TRIPLE;  Surgeon: [name], DPM;  Location: [facility] ORS;  Service: Podiatry;  Laterality: Right;  . HERNIA REPAIR  10/09/11   [facility] Incarcerated- Dr. [name]  . OOPHORECTOMY Left 12/09/2009  . TUBAL LIGATION  1993  . VENTRAL HERNIA REPAIR  10/09/2011    There were no vitals filed for this visit.    Subjective Assessment - 11/04/19 1619    Subjective Patient states she was born with extremely flat feet and over time with age it messed with her ankle joint and cause a lot of pain that was unbearable (unable to work). This lead to surgery on her left ankle/foot on 08/15/2019. According to the op note, the following procedures were performed: 1. Percutaneous tendo Achilles lengthening 2. Triple arthrodesis left foot. She has been ambulating NWB in a cast for 3 months using a knee scooter for mobility until last Thursday 10/30/2019 where she was placed in a walking cam boot and instructed the following according to the visit documentation: ""Okay to weight-bear as tolerated in your boot for the next 3 weeks. When comfortable can transition into an athletic style shoe. Physical therapy can assist  you with your increased weightbearing and strength." She had the same surgery performed on the R ankle/foot in October 2018. The right side feels good now. She is currently using a RW at home and B axial crutches when she is out of the house. She is now putting partial weight on it. She does not sleep in the boot and takes small steps at night.    Pertinent History Patient is a 49 y.o. female who presents to outpatient physical therapy with a referral for medical diagnosis equinus deformity L foot. This patient's chief complaints consist of pain, stiffness, loss of function and weightbearing tolerance second to achilles lengthening and triple arthrodesis surgery  to the L ankle on 08/15/2019, leading to the following functional deficits: Limited ambulatory distance, slower with ADLs and IADLs, not able to work, cannot do much housework. Relevant past medical history and comorbidities include hypertension, fibromyalgia (controlled with medication), hx of L knee problems (baker's cyst), chronic back pain (never down legs), obesity, hx of R ankle arthrodesis, myopia, depression (recently bad related to surgical situation, feels she has enough support), glaucoma of both eyes, arthritis, cardiac murmur, dysmetabolic syndrome. Patient denies hx of cancer, stroke, seizures, lung problem, major cardiac events, diabetes, unexplained weight loss, changes in bowel or bladder problems, new onset stumbling or dropping things.    Limitations House hold activities;Standing;Walking;Lifting;Other (comment)   Limited ambulatory distance, slower with ADLs and IADLs, not able to work, cannot do much housework   How long can you sit comfortably? no limited by this problem    How long can you stand comfortably? 5-10 min (brush teeth, comb hair)    How long can you walk comfortably? 2 min    Diagnostic tests Radiograph 10/30/2019: "AP oblique and lateral of the left foot reveals surgical arthrodesis of   the talonavicular, calcaneocuboid, and subtalar joint.  Solid arthrodesis   noted in all 3 views.  No signs of motion to the hardware.  Good bone   consolidation across the fusion sites are noted.  Remainder the foot x-ray   is negative for acute fracture"    Patient Stated Goals return to work, return to walking on her own without problems, be able to complete her usual activities as she did prior to surgery    Currently in Pain? Yes    Pain Score 6    W: 10/10; B: 0/10   Pain Location Ankle    Pain Orientation Left;Lateral;Anterior    Pain Descriptors / Indicators Aching    Pain Type Surgical pain    Pain Radiating Towards denies numbness or tingling    Pain Onset 1 to 4 weeks ago     Pain Frequency Intermittent    Aggravating Factors  increased or prolonged weightbearing    Pain Relieving Factors not bearing weight on it, sitting, lying down, tylenol/ibuprofen    Effect of Pain on Daily Activities Limited ambulatory distance, slower with ADLs and IADLs, not able to work, cannot do much housework              [facility] PT Assessment - 11/04/19 0001      Assessment   Medical Diagnosis equinus deformity L foot    Referring Provider (PT)  [name], DPM    Onset Date/Surgical Date 08/15/19    Hand Dominance Right    Next MD Visit 12/11/2019    Prior Therapy none for this problem prior to current episode of care, previously for the R side for same procedure in 2018  with favorable outcome      Precautions   Precautions Fall    Required Braces or Orthoses Other Brace/Splint    Other Brace/Splint L Cam boot when weight bearing      Restrictions   Weight Bearing Restrictions Yes    LUE Weight Bearing Weight bearing as tolerated   in cam boot for 3 weeks from 10/30/2019     Balance Screen   Has the patient fallen in the past 6 months No    Has the patient had a decrease in activity level because of a fear of falling?  Yes    Is the patient reluctant to leave their home because of a fear of falling?  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 Tremor: none . Skin: The incision sites appear to be healing well with no excessive redness, warmth, drainage or signs of infection present.   . Significantly more edema in the L forefoot and around ankle compared to R but not excessive.  . Bed mobility: supine <> sit <> prone Independent.  . Transfers: sit <> stand mod I to axial crutches . Gait: ambulates partial weight bearing on L LE with cam boot on using B axial crutches and step to pattern stooped.   . Stairs: deferred   EDEMA Figure 8 measurements (cm): R = 51cm; L = 56 cm  PERIPHERAL JOINT MOTION (in degrees)  Active Range of Motion (AROM) *Indicates pain 11/04/19 Date Date  Joint/Motion R/L R/L R/L  Ankle/Foot     Dorsiflexion (knee ext) / / /  Dorsiflexion (knee flex) 5/0 / /  Plantarflexion 35/35* / /  Everison 5/13 / /  Inversion 10/2 / /  Comments:  B hip and knee AROM appear WFL except lacking bilateral hip extension.   Passive Range of Motion (PROM) *Indicates pain 11/04/19 Date Date  Joint/Motion R/L R/L R/L  Ankle/Foot     Dorsiflexion (knee ext) / / /  Dorsiflexion (knee flex) 10/5* / /  Great toe extension 50/73 / /  Comments:  B hip and knee PROM appear WFL except lacking bilateral hip extension.   MUSCLE PERFORMANCE (MMT):  *Indicates pain 11/04/19 Date Date  Joint/Motion R/L R/L R/L  Hip     Flexion 5/5 / /  Extension (knee ext) 4+/4+ / /  Abduction 5/3+ / /  Knee     Extension 5/5 / /  Flexion 4+/4 / /  Ankle/Foot     Dorsiflexion  4+/4+* / /  Great toe extension 5/4 / /  Eversion 5/4+ / /  Plantarflexion 4/3+* / /  Inversion 4+/3* / /  Comments: hip extension lacks range of motion  EDUCATION/COGNITION: Patient is alert and oriented X 4.  Objective measurements completed on  examination: See above findings.     TREATMENT:   Therapeutic exercise: to centralize symptoms and improve ROM, strength, muscular endurance, and activity tolerance required for successful completion of functional activities.  - seated L ankle AROM alphabet - seated L ankle PF against yellow theraband x 20 - seated L ankle gastroc stretch with towel, 2x30 seconds - seated L ankle soleus stretch with towel, 1x30 seconds. - Education on diagnosis, prognosis, POC, anatomy and physiology of current condition.  - Education on HEP including handout   HOME EXERCISE PROGRAM Access Code: [email or web address] URL: [email or web address] Date: 11/04/2019 Prepared by: Norton Blizzard  Exercises Seated Ankle Alphabet - 2 x daily - 1 sets Seated Ankle Plantar Flexion with Resistance Loop - 2 x daily - 2 sets - 20 reps Leg stretch - 2 x daily - 3 reps - 30 seconds hold Seated Soleus Stretch with Strap - 2 x daily - 3 reps - 30 hold   PT Education - 11/04/19 1733    Education provided Yes    Education Details Exercise purpose/form. Self management techniques. Education on diagnosis, prognosis, POC, anatomy and physiology of current condition Education on HEP including handout    Person(s) Educated Patient    Methods Explanation;Demonstration;Tactile cues;Verbal cues;Handout    Comprehension Verbalized understanding;Returned demonstration;Verbal cues required;Tactile cues required;Need further instruction            PT Short Term Goals - 11/04/19 1746      PT SHORT TERM GOAL #1   Title Be independent with initial home exercise program for self-management of symptoms.    Baseline initial HEP provided at IE (11/04/2019);    Time 2    Status New    Target Date 11/18/19             PT Long Term Goals - 11/04/19 1739      PT LONG TERM GOAL #1   Title Demonstrate improved FOTO score to equal or greater than 53 by visit #16 to demonstrate improvement in overall condition and self-reported  functional ability.    Baseline 32 (11/04/2019);    Time 12    Period Weeks    Status New   TARGET DATE FOR ALL LONG TERM GOALS: 01/27/2020     PT LONG TERM GOAL #2   Title Pt will ambulate equal or greater than 10000 feet on the 6 Minute Walk Test with minimal>no gait abnormalities to return to walking for exercise and work.    Baseline ambulates 100 feet to car with CAM boot and step too pattern PWB on B axial crutches (11/04/2019);    Time 12    Period Weeks    Status New      PT LONG TERM GOAL #3   Title Pt will be able to stand for at least 30 minutes without pain for return to work    Baseline 5-10 min before needing to sit down due to pain (11/04/2019);    Time 12    Period Weeks    Status New      PT LONG TERM GOAL #4   Title Pt's L ankle AROM will improve to [facility] for improved functional use of LLE during ambulation and squatting.    Baseline from neutral DF: 0d  PF: 35d    Time 12    Period Weeks    Status New      PT LONG TERM GOAL #5   Title Be independent with a long-term home exercise program for self-management of symptoms.    Baseline initial HEP provided at IE (11/04/2019);    Time 12    Period Weeks    Status New                  Plan - 11/04/19 1749    Clinical Impression Statement Patient is a 49 y.o. female referred to outpatient physical therapy with a medical diagnosis of equinus deformity L foot who presents with signs and symptoms consistent with L ankle pain, stiffness, and weakness s/p achilles lengthening and triple arthrodesis surgery to the L ankle on 08/15/2019. Patient is currently transitioning to full weight bearing in CAM boot per physician guidelines.  Patient presents with significant pain, stiffness, skin integrity, ROM, joint stiffness, muscle performance (strength/power/endurance), activity tolerance, and balance impairments that are limiting ability to complete her usual activities including basic household and community mobility, ADLs,  IADLs and work without difficulty. Patient is currently unable to work second to limited ambulation and standing tolerance. Patient will benefit from skilled physical therapy intervention to address current body structure impairments and activity limitations to improve function and work towards goals set in current POC in order to return to prior level of function or maximal functional improvement.    Personal Factors and Comorbidities Age;Comorbidity 3+;Past/Current Experience;Fitness;Time since onset of injury/illness/exacerbation;Profession    Comorbidities Relevant past medical history and comorbidities include hypertension, fibromyalgia (controlled with medication), hx of L knee problems (baker's cyst), chronic back pain (never down legs), obesity, hx of R ankle arthrodesis, myopia, depression (recently bad related to surgical situation, feels she has enough support), glaucoma of both eyes, arthritis, cardiac murmur, dysmetabolic syndrome    Examination-Activity Limitations Squat;Stairs;Locomotion Level;Stand;Bend;Caring for Others;Carry;Transfers;Dressing   Limited ambulatory distance, slower with ADLs and IADLs, not able to work, cannot do much housework   Examination-Participation Restrictions Church;Laundry;Cleaning;Shop;Community Activity;Meal Prep;Driving;Occupation;Yard Work    Stability/Clinical Decision Making Stable/Uncomplicated    Optometrist Low    Rehab Potential Good    PT Frequency 2x / week    PT Duration 12 weeks    PT Treatment/Interventions ADLs/Self Care Home Management;Aquatic Therapy;Biofeedback;Cryotherapy;Electrical Stimulation;Moist Heat;DME Instruction;Gait Network engineer;Therapeutic activities;Therapeutic exercise;Balance training;Neuromuscular re-education;Patient/family education;Orthotic Fit/Training;Manual techniques;Compression bandaging;Scar mobilization;Passive range of motion;Dry needling;Energy conservation;Taping;Spinal Manipulations;Joint  Manipulations    PT Next Visit Plan assess response to HEP and update it; progressive strengthening, weight bearing, functional activities    PT Home Exercise Plan Medbridge: Access Code: [email or web address]    Consulted and Agree with Plan of Care Patient           Patient will benefit from skilled therapeutic intervention in order to improve the following deficits and impairments:  Abnormal gait, Decreased activity tolerance, Decreased balance, Decreased endurance, Decreased knowledge of precautions, Decreased knowledge of use of DME, Decreased mobility, Decreased range of motion, Decreased scar mobility, Decreased strength, Difficulty walking, Hypomobility, Increased edema, Impaired perceived functional ability, Impaired flexibility, Improper body mechanics, Postural dysfunction, Obesity, Pain, Decreased skin integrity  Visit Diagnosis: Pain in left ankle and joints of left foot  Muscle weakness (generalized)  Difficulty in walking, not elsewhere classified     Problem List Patient Active Problem List   Diagnosis Date Noted  . Morbid obesity (HCC) 04/17/2018  . 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 Flat  foot 01/28/2016  . Aortic sclerosis 10/14/2015  . Midline low back pain without sciatica 09/20/2015  . Leukocytosis 11/29/2014  . Osteoarthritis of left knee 11/20/2014  . History of ventral hernia repair 10/27/2014  . Allergic rhinitis 08/17/2014  . Axillary hidradenitis suppurativa 08/17/2014  . Baker cyst 08/17/2014  . Chronic constipation 08/17/2014  . 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 Engages in travel abroad 08/17/2014  . Fibromyalgia 08/17/2014  . Cardiac murmur 08/17/2014  . Dysmetabolic syndrome 08/17/2014  . Plantar fasciitis 08/17/2014  . Beat, premature ventricular 08/17/2014  . 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 Obstructive apnea 12/02/2013  . Essential (primary) hypertension  12/09/2009  . Hypertrichosis 12/09/2009    Luretha MurphySara R. Ilsa IhaSnyder, PT, DPT 11/04/19, 5:53 PM  [facility] [address] [place], [place], [address] Phone: [phone]   Fax:  [phone]  Name: Kathleen Conley MRN: [id number] Date of Birth: 06/08/1970

## 2019-11-11 ENCOUNTER — Ambulatory Visit: Payer: 59 | Admitting: Physical Therapy

## 2019-11-18 ENCOUNTER — Ambulatory Visit: Payer: 59 | Admitting: Physical Therapy

## 2019-11-18 ENCOUNTER — Other Ambulatory Visit: Payer: Self-pay

## 2019-11-18 ENCOUNTER — Encounter: Payer: Self-pay | Admitting: Physical Therapy

## 2019-11-18 DIAGNOSIS — M6281 Muscle weakness (generalized): Secondary | ICD-10-CM

## 2019-11-18 DIAGNOSIS — R262 Difficulty in walking, not elsewhere classified: Secondary | ICD-10-CM

## 2019-11-18 DIAGNOSIS — M25572 Pain in left ankle and joints of left foot: Secondary | ICD-10-CM | POA: Diagnosis not present

## 2019-11-18 NOTE — Therapy (Signed)
[facility] PH[facility] AND [facility]OR[facility] MEDICINE [address], [place], [address] Phone: [phone]   Fax:  [phone]  Physical Therapy Treatment  Patient Details  Name: Kathleen Conley MRN: [id number] Date of Birth: [birth year] Referring Provider (PT):  [name], DPM   Encounter Date: 11/18/2019   PT End of Session - 11/18/19 1621    Visit Number 2    Number of Visits 24    Date for PT Re-Evaluation 01/27/20    Authorization Type Self pay reporting period from 11/04/2019    Progress Note Due on Visit 10    PT Start Time 1612    PT Stop Time 1650    PT Time Calculation (min) 38 min    Equipment Utilized During Treatment Other (comment)   axial crutches and left cam boot   Activity Tolerance Patient tolerated treatment well    Behavior During Therapy [facility] for tasks assessed/performed           Past Medical History:  Diagnosis Date  . 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 Allergy   . Anemia    Iron Deficiency  . Arthritis   . Astigmatism 08/21/2017   Dr. Marcellus Scott  . Baker cyst   . Breast mass, right 01/18/2010  . Cardiac murmur   . Chronic constipation   . Depression   . Dysmetabolic syndrome   . Fibromyalgia   . Hairiness   . Hydradenitis   . Hypertension   . Incarcerated ventral hernia 09/29/2011  . 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 Marcellus Scott  . Myopia 08/21/2017   Dr. Marcellus Scott - [facility]  . Obesity   . Obstructive sleep apnea    no CPAP  . Open-angle glaucoma of both eyes, mild stage 08/21/2017   Dr. Marcellus Scott - [facility]  . Plantar fasciitis   . Pre-diabetes   . Presbyopia 07/02/219   Dr. Marcellus Scott  . PVC (premature ventricular contraction)     Past Surgical History:  Procedure Laterality Date  . ACHILLES TENDON SURGERY Left 08/15/2019   Procedure: TRIPLE ARTHRODESIS AND ACHILLES TENDON LENGTHENING LEFT FOOT;   Surgeon: [name], DPM;  Location: [facility] ORS;  Service: Podiatry;  Laterality: Left;  . FOOT ARTHRODESIS Right 12/01/2016   Procedure: ARTHRODESIS FOOT; TRIPLE;  Surgeon: [name], DPM;  Location: [facility] ORS;  Service: Podiatry;  Laterality: Right;  . HERNIA REPAIR  10/09/11   [facility] Incarcerated- Dr. [name]  . OOPHORECTOMY Left 12/09/2009  . TUBAL LIGATION  1993  . VENTRAL HERNIA REPAIR  10/09/2011    There were no vitals filed for this visit.   Subjective Assessment - 11/18/19 1613    Subjective Patient reports she is feeling well today and her pain is 2-3/10 in her left ankle. She was painful following last treatment session but it got better over the next two days. She did the alphabet a couple of times but has had trouble doing her HEP. Pain is getting a bit better with walking, but walking around in [employer] is a big problem. Still using B axial crutches and cam boot.    Pertinent History Patient is a 49 y.o. female who presents to outpatient physical therapy with a referral for medical diagnosis equinus deformity L foot. This patient's chief complaints consist of pain, stiffness, loss of function and weightbearing tolerance second to achilles lengthening and triple arthrodesis surgery to the L ankle on 08/15/2019, leading  to the following functional deficits: Limited ambulatory distance, slower with ADLs and IADLs, not able to work, cannot do much housework. Relevant past medical history and comorbidities include hypertension, fibromyalgia (controlled with medication), hx of L knee problems (baker's cyst), chronic back pain (never down legs), obesity, hx of R ankle arthrodesis, myopia, depression (recently bad related to surgical situation, feels she has enough support), glaucoma of both eyes, arthritis, cardiac murmur, dysmetabolic syndrome. Patient denies hx of cancer, stroke, seizures, lung problem, major cardiac events, diabetes, unexplained weight loss, changes in bowel or bladder  problems, new onset stumbling or dropping things.    Limitations House hold activities;Standing;Walking;Lifting;Other (comment)   Limited ambulatory distance, slower with ADLs and IADLs, not able to work, cannot do much housework   How long can you sit comfortably? no limited by this problem    How long can you stand comfortably? 5-10 min (brush teeth, comb hair)    How long can you walk comfortably? 2 min    Diagnostic tests Radiograph 10/30/2019: "AP oblique and lateral of the left foot reveals surgical arthrodesis of   the talonavicular, calcaneocuboid, and subtalar joint.  Solid arthrodesis   noted in all 3 views.  No signs of motion to the hardware.  Good bone   consolidation across the fusion sites are noted.  Remainder the foot x-ray   is negative for acute fracture"    Patient Stated Goals return to work, return to walking on her own without problems, be able to complete her usual activities as she did prior to surgery    Currently in Pain? Yes    Pain Score 3     Pain Location Ankle    Pain Orientation Left    Pain Onset 1 to 4 weeks ago             TREATMENT:   Therapeutic exercise:to centralize symptoms and improve ROM, strength, muscular endurance, and activity tolerance required for successful completion of functional activities.   - NuStep level 5 using bilateral lower extremities. Seat setting 8. For improved extremity mobility, muscular endurance, and activity tolerance; and to induce the analgesic effect of aerobic exercise, stimulate improved joint nutrition, and prepare body structures and systems for following interventions. X 6  minutes. Average SPM = 60 - Seated left ankle BAPS board, level 2, plantarflexion/dorsiflexion, inversion/eversion, circles clockwise, circles counter clockwise, 20 reps.  - seated L ankle AROM alphabet x 1 - seated L ankle PF against green theraband 3x 20 - seated L ankle gastroc stretch with gait belt, 3x30 seconds - seated L ankle soleus  stretch with gait belt, 3x30 seconds.  - Education on diagnosis, prognosis, POC, anatomy and physiology of current condition.   Manual therapy: to reduce pain and tissue tension, improve range of motion, neuromodulation, in order to promote improved ability to complete functional activities. - Prone STM to L triceps surae  HOME EXERCISE PROGRAM Access Code: [email or web address] URL: [email or web address] Date: 11/04/2019 Prepared by: Norton Blizzard  Exercises Seated Ankle Alphabet - 2 x daily - 1 sets Seated Ankle Plantar Flexion with Resistance Loop - 2 x daily - 2 sets - 20 reps Leg stretch - 2 x daily - 3 reps - 30 seconds hold Seated Soleus Stretch with Strap - 2 x daily - 3 reps - 30 hold    PT Education - 11/18/19 1621    Education Details Exercise purpose/form. Self management techniques.    Person(s) Educated Patient    Methods Explanation;Demonstration;Tactile cues;Verbal cues  Comprehension Verbalized understanding;Returned demonstration;Verbal cues required;Tactile cues required;Need further instruction            PT Short Term Goals - 11/04/19 1746      PT SHORT TERM GOAL #1   Title Be independent with initial home exercise program for self-management of symptoms.    Baseline initial HEP provided at IE (11/04/2019);    Time 2    Status New    Target Date 11/18/19             PT Long Term Goals - 11/04/19 1739      PT LONG TERM GOAL #1   Title Demonstrate improved FOTO score to equal or greater than 53 by visit #16 to demonstrate improvement in overall condition and self-reported functional ability.    Baseline 32 (11/04/2019);    Time 12    Period Weeks    Status New   TARGET DATE FOR ALL LONG TERM GOALS: 01/27/2020     PT LONG TERM GOAL #2   Title Pt will ambulate equal or greater than 10000 feet on the 6 Minute Walk Test with minimal>no gait abnormalities to return to walking for exercise and work.    Baseline ambulates 100 feet to car with CAM  boot and step too pattern PWB on B axial crutches (11/04/2019);    Time 12    Period Weeks    Status New      PT LONG TERM GOAL #3   Title Pt will be able to stand for at least 30 minutes without pain for return to work    Baseline 5-10 min before needing to sit down due to pain (11/04/2019);    Time 12    Period Weeks    Status New      PT LONG TERM GOAL #4   Title Pt's L ankle [facility]OM [facility] for improved functional use of LLE during ambulation and squatting.    Baseline from neutral DF: 0d PF: 35d    Time 12    Period Weeks    Status New      PT LONG TERM GOAL #5   Title Be independent with a long-term home exercise program for self-management of symptoms.    Baseline initial HEP provided at IE (11/04/2019);    Time 12    Period Weeks    Status New                 Plan - 11/18/19 1802    Clinical Impression Statement Patient tolerated treatment with some difficulty due to pain with inversion and eversion. States it is tolerable and feels okay by end of session after back in the boot. Reviewed HEP and adjusted as appropriate. Does lack proprioceptive control at the ankle and is not yet weight bearing without B axial crutches. Patient would benefit from continued management of limiting condition by skilled physical therapist to address remaining impairments and functional limitations to work towards stated goals and return to PLOF or maximal functional independence.    Personal Factors and Comorbidities Age;Comorbidity 3+;Past/Current Experience;Fitness;Time since onset of injury/illness/exacerbation;Profession    Comorbidities Relevant past medical history and comorbidities include hypertension, fibromyalgia (controlled with medication), hx of L knee problems (baker's cyst), chronic back pain (never down legs), obesity, hx of R ankle arthrodesis, myopia, depression (recently bad related to surgical situation, feels she has enough support), glaucoma of both eyes,  arthritis, cardiac murmur, dysmetabolic syndrome    Examination-Activity Limitations Squat;Stairs;Locomotion Level;Stand;Bend;Caring for Others;Carry;Transfers;Dressing  Limited ambulatory distance, slower with ADLs and IADLs, not able to work, cannot do much housework   Examination-Participation Restrictions Church;Laundry;Cleaning;Shop;Community Activity;Meal Prep;Driving;Occupation;Yard Work    Stability/Clinical Decision Making Stable/Uncomplicated    Rehab Potential Good    PT Frequency 2x / week    PT Duration 12 weeks    PT Treatment/Interventions ADLs/Self Care Home Management;Aquatic Therapy;Biofeedback;Cryotherapy;Electrical Stimulation;Moist Heat;DME Instruction;Gait Network engineer;Therapeutic activities;Therapeutic exercise;Balance training;Neuromuscular re-education;Patient/family education;Orthotic Fit/Training;Manual techniques;Compression bandaging;Scar mobilization;Passive range of motion;Dry needling;Energy conservation;Taping;Spinal Manipulations;Joint Manipulations    PT Next Visit Plan progressive strengthening, weight bearing, functional activities    PT Home Exercise Plan Medbridge: Access Code: [email or web address]    Consulted and Agree with Plan of Care Patient           Patient will benefit from skilled therapeutic intervention in order to improve the following deficits and impairments:  Abnormal gait, Decreased activity tolerance, Decreased balance, Decreased endurance, Decreased knowledge of precautions, Decreased knowledge of use of DME, Decreased mobility, Decreased range of motion, Decreased scar mobility, Decreased strength, Difficulty walking, Hypomobility, Increased edema, Impaired perceived functional ability, Impaired flexibility, Improper body mechanics, Postural dysfunction, Obesity, Pain, Decreased skin integrity  Visit Diagnosis: Pain in left ankle and joints of left foot  Muscle weakness (generalized)  Difficulty in walking, not elsewhere  classified     Problem List Patient Active Problem List   Diagnosis Date Noted  . Morbid obesity (HCC) 04/17/2018  . 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 Flat foot 01/28/2016  . Aortic sclerosis 10/14/2015  . Midline low back pain without sciatica 09/20/2015  . Leukocytosis 11/29/2014  . Osteoarthritis of left knee 11/20/2014  . History of ventral hernia repair 10/27/2014  . Allergic rhinitis 08/17/2014  . Axillary hidradenitis suppurativa 08/17/2014  . Baker cyst 08/17/2014  . Chronic constipation 08/17/2014  . 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 Engages in travel abroad 08/17/2014  . Fibromyalgia 08/17/2014  . Cardiac murmur 08/17/2014  . Dysmetabolic syndrome 08/17/2014  . Plantar fasciitis 08/17/2014  . Beat, premature ventricular 08/17/2014  . 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 Obstructive apnea 12/02/2013  . Essential (primary) hypertension 12/09/2009  . Hypertrichosis 12/09/2009    Luretha Murphy. Ilsa Iha, PT, DPT 11/18/19, 6:03 PM  [facility] PHYSICAL AND SPORTS MEDICINE [address] [place], [address] Phone: [phone]   Fax:  [phone]  Name: Kathleen Conley MRN: [id number] Date of Birth: 08/05/1970

## 2019-11-20 ENCOUNTER — Other Ambulatory Visit: Payer: Self-pay

## 2019-11-20 ENCOUNTER — Encounter: Payer: Self-pay | Admitting: Physical Therapy

## 2019-11-20 ENCOUNTER — Ambulatory Visit: Payer: 59 | Admitting: Physical Therapy

## 2019-11-20 DIAGNOSIS — R262 Difficulty in walking, not elsewhere classified: Secondary | ICD-10-CM

## 2019-11-20 DIAGNOSIS — M25572 Pain in left ankle and joints of left foot: Secondary | ICD-10-CM | POA: Diagnosis not present

## 2019-11-20 DIAGNOSIS — M6281 Muscle weakness (generalized): Secondary | ICD-10-CM

## 2019-11-20 NOTE — Therapy (Signed)
[facility] [address] [place], [address] Phone: [phone]   Fax:  [phone]  Physical Therapy Treatment  Patient Details  Name: Kathleen Conley MRN: [id number] Date of Birth: 1970/06/27 Referring Provider (PT):  [name], DPM   Encounter Date: 11/20/2019   PT End of Session - 11/20/19 1126    Visit Number 3    Number of Visits 24    Date for PT Re-Evaluation 01/27/20    Authorization Type Self pay reporting period from 11/04/2019    Progress Note Due on Visit 10    PT Start Time 1124    PT Stop Time 1202    PT Time Calculation (min) 38 min    Equipment Utilized During Treatment Other (comment)   axial crutches and left cam boot   Activity Tolerance Patient tolerated treatment well    Behavior During Therapy [facility] for tasks assessed/performed           Past Medical History:  Diagnosis Date  . 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 Allergy   . Anemia    Iron Deficiency  . Arthritis   . Astigmatism 08/21/2017   Dr. Marcellus Scott  . Baker cyst   . Breast mass, right 01/18/2010  . Cardiac murmur   . Chronic constipation   . Depression   . Dysmetabolic syndrome   . Fibromyalgia   . Hairiness   . Hydradenitis   . Hypertension   . Incarcerated ventral hernia 09/29/2011  . 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 Marcellus Scott  . Myopia 08/21/2017   Dr. Marcellus Scott - [facility]  . Obesity   . Obstructive sleep apnea    no CPAP  . Open-angle glaucoma of both eyes, mild stage 08/21/2017   Dr. Marcellus Scott - [facility]  . Plantar fasciitis   . Pre-diabetes   . Presbyopia 07/02/219   Dr. Marcellus Scott  . PVC (premature ventricular contraction)     Past Surgical History:  Procedure Laterality Date  . ACHILLES TENDON SURGERY Left 08/15/2019   Procedure: TRIPLE ARTHRODESIS AND ACHILLES TENDON LENGTHENING LEFT FOOT;   Surgeon: [name], DPM;  Location: [facility] ORS;  Service: Podiatry;  Laterality: Left;  . FOOT ARTHRODESIS Right 12/01/2016   Procedure: ARTHRODESIS FOOT; TRIPLE;  Surgeon: [name], DPM;  Location: [facility] ORS;  Service: Podiatry;  Laterality: Right;  . HERNIA REPAIR  10/09/11   [facility] Incarcerated- Dr. [name]  . OOPHORECTOMY Left 12/09/2009  . TUBAL LIGATION  1993  . VENTRAL HERNIA REPAIR  10/09/2011    There were no vitals filed for this visit.   Subjective Assessment - 11/20/19 1124    Subjective Patient reports she is feeling well today and has mild pain in the left ankle rated 2/10 upon arrival. States she had some increased pain that was tolerable after last session but it went away after a few hours and she was able to do her HEP yesterday without a problem.    Pertinent History Patient is a 49 y.o. female who presents to outpatient physical therapy with a referral for medical diagnosis equinus deformity L foot. This patient's chief complaints consist of pain, stiffness, loss of function and weightbearing tolerance second to achilles lengthening and triple arthrodesis surgery to the L ankle on 08/15/2019, leading to the following functional deficits: Limited ambulatory distance, slower with ADLs and IADLs, not able to work, cannot do much housework.  Relevant past medical history and comorbidities include hypertension, fibromyalgia (controlled with medication), hx of L knee problems (baker's cyst), chronic back pain (never down legs), obesity, hx of R ankle arthrodesis, myopia, depression (recently bad related to surgical situation, feels she has enough support), glaucoma of both eyes, arthritis, cardiac murmur, dysmetabolic syndrome. Patient denies hx of cancer, stroke, seizures, lung problem, major cardiac events, diabetes, unexplained weight loss, changes in bowel or bladder problems, new onset stumbling or dropping things.    Limitations House hold  activities;Standing;Walking;Lifting;Other (comment)   Limited ambulatory distance, slower with ADLs and IADLs, not able to work, cannot do much housework   How long can you sit comfortably? no limited by this problem    How long can you stand comfortably? 5-10 min (brush teeth, comb hair)    How long can you walk comfortably? 2 min    Diagnostic tests Radiograph 10/30/2019: "AP oblique and lateral of the left foot reveals surgical arthrodesis of   the talonavicular, calcaneocuboid, and subtalar joint.  Solid arthrodesis   noted in all 3 views.  No signs of motion to the hardware.  Good bone   consolidation across the fusion sites are noted.  Remainder the foot x-ray   is negative for acute fracture"    Patient Stated Goals return to work, return to walking on her own without problems, be able to complete her usual activities as she did prior to surgery    Currently in Pain? Yes    Pain Score 2     Pain Location Ankle    Pain Orientation Left    Pain Onset 1 to 4 weeks ago           TREATMENT:  Therapeutic exercise:to centralize symptoms and improve ROM, strength, muscular endurance, and activity tolerance required for successful completion of functional activities. - NuStep level 6 using bilateral lower extremities. Seat setting 8. For improved extremity mobility, muscular endurance, and activity tolerance; and to induce the analgesic effect of aerobic exercise, stimulate improved joint nutrition, and prepare body structures and systems for following interventions. X 6  minutes. Average SPM = 69 - Seated left ankle BAPS board, level 2, plantarflexion/dorsiflexion, inversion/eversion, circles clockwise, circles counter clockwise, 20 reps.  - seated L ankle gastroc stretch with gait belt, 3x30 seconds - seated L ankle AROM alphabet form check (correct) - seated L ankle PF against yellow theraband 1x 20 - L Toe splays, repeated 2 sec holds. Ball of foot and heel maintains contact with floor.  To improve intrinsic foot muscle activation and strength in order to better support arch and intrinsic foot structures. x20 - L Toe Yoga: great toe extension with small toes flexion pressure into floor, repeated 2 sec holds; small toes extension with great toe flexion pressure into floor, repeated 2 sec holds. Ball of foot and heel maintains contact with floor. To improve intrinsic foot muscle activation and strength in order to better support arch and intrinsic foot structures. x20 each - ambulation around clinic wearing CAM boot 2x100 feet with SPC and single axial crutch. With SBA  - instructed to bring athletic shoe next session to start weaning out of boot.   HOME EXERCISE PROGRAM Access Code: [email or web address] URL: [email or web address] Date: 11/04/2019 Prepared by: Norton Blizzard  Exercises Seated Ankle Alphabet - 2 x daily - 1 sets Seated Ankle Plantar Flexion with Resistance Loop - 2 x daily - 2 sets - 20 reps Leg stretch - 2 x daily - 3 reps - 30 seconds hold  Seated Soleus Stretch with Strap - 2 x daily - 3 reps - 30 hold    PT Education - 11/20/19 1125    Education Details Exercise purpose/form. Self management techniques    Person(s) Educated Patient    Methods Explanation;Demonstration;Tactile cues;Verbal cues    Comprehension Verbalized understanding;Returned demonstration;Verbal cues required;Tactile cues required;Need further instruction            PT Short Term Goals - 11/20/19 1258      PT SHORT TERM GOAL #1   Title Be independent with initial home exercise program for self-management of symptoms.    Baseline initial HEP provided at IE (11/04/2019);    Time 2    Status Achieved    Target Date 11/18/19             PT Long Term Goals - 11/04/19 1739      PT LONG TERM GOAL #1   Title Demonstrate improved FOTO score to equal or greater than 53 by visit #16 to demonstrate improvement in overall condition and self-reported functional ability.    Baseline  32 (11/04/2019);    Time 12    Period Weeks    Status New   TARGET DATE FOR ALL LONG TERM GOALS: 01/27/2020     PT LONG TERM GOAL #2   Title Pt will ambulate equal or greater than 10000 feet on the 6 Minute Walk Test with minimal>no gait abnormalities to return to walking for exercise and work.    Baseline ambulates 100 feet to car with CAM boot and step too pattern PWB on B axial crutches (11/04/2019);    Time 12    Period Weeks    Status New      PT LONG TERM GOAL #3   Title Pt will be able to stand for at least 30 minutes without pain for return to work    Baseline 5-10 min before needing to sit down due to pain (11/04/2019);    Time 12    Period Weeks    Status New      PT LONG TERM GOAL #4   Title Pt's L ankle AROM will improve to [facility] for improved functional use of LLE during ambulation and squatting.    Baseline from neutral DF: 0d PF: 35d    Time 12    Period Weeks    Status New      PT LONG TERM GOAL #5   Title Be independent with a long-term home exercise program for self-management of symptoms.    Baseline initial HEP provided at IE (11/04/2019);    Time 12    Period Weeks    Status New                 Plan - 11/20/19 1409    Clinical Impression Statement Patient tolerated treatment well with much less pain than last session. Today marks the 3 week mark from her last physician appointment where she was told he could start transitioning to [facility] in athletic shoe under PT supervision. Recommended she bring an athletic shoe to next session. Introduced foot intrinsic strengthening. Patient demonstrated ability to walk stably with SPC using CAM boot. Patient would benefit from continued management of limiting condition by skilled physical therapist to address remaining impairments and functional limitations to work towards stated goals and return to PLOF or maximal functional independence.    Personal Factors and Comorbidities Age;Comorbidity 3+;Past/Current  Experience;Fitness;Time since onset of injury/illness/exacerbation;Profession    Comorbidities Relevant past medical history  and comorbidities include hypertension, fibromyalgia (controlled with medication), hx of L knee problems (baker's cyst), chronic back pain (never down legs), obesity, hx of R ankle arthrodesis, myopia, depression (recently bad related to surgical situation, feels she has enough support), glaucoma of both eyes, arthritis, cardiac murmur, dysmetabolic syndrome    Examination-Activity Limitations Squat;Stairs;Locomotion Level;Stand;Bend;Caring for Others;Carry;Transfers;Dressing   Limited ambulatory distance, slower with ADLs and IADLs, not able to work, cannot do much housework   Examination-Participation Restrictions Church;Laundry;Cleaning;Shop;Community Activity;Meal Prep;Driving;Occupation;Yard Work    Stability/Clinical Decision Making Stable/Uncomplicated    Rehab Potential Good    PT Frequency 2x / week    PT Duration 12 weeks    PT Treatment/Interventions ADLs/Self Care Home Management;Aquatic Therapy;Biofeedback;Cryotherapy;Electrical Stimulation;Moist Heat;DME Instruction;Gait Network engineertraining;Stair training;Therapeutic activities;Therapeutic exercise;Balance training;Neuromuscular re-education;Patient/family education;Orthotic Fit/Training;Manual techniques;Compression bandaging;Scar mobilization;Passive range of motion;Dry needling;Energy conservation;Taping;Spinal Manipulations;Joint Manipulations    PT Next Visit Plan progressive strengthening, weight bearing, functional activities    PT Home Exercise Plan Medbridge: Access Code: [email or web address]    Consulted and Agree with Plan of Care Patient           Patient will benefit from skilled therapeutic intervention in order to improve the following deficits and impairments:  Abnormal gait, Decreased activity tolerance, Decreased balance, Decreased endurance, Decreased knowledge of precautions, Decreased knowledge of use of DME,  Decreased mobility, Decreased range of motion, Decreased scar mobility, Decreased strength, Difficulty walking, Hypomobility, Increased edema, Impaired perceived functional ability, Impaired flexibility, Improper body mechanics, Postural dysfunction, Obesity, Pain, Decreased skin integrity  Visit Diagnosis: Pain in left ankle and joints of left foot  Muscle weakness (generalized)  Difficulty in walking, not elsewhere classified     Problem List Patient Active Problem List   Diagnosis Date Noted  . Morbid obesity (HCC) 04/17/2018  . 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 Flat foot 01/28/2016  . Aortic sclerosis 10/14/2015  . Midline low back pain without sciatica 09/20/2015  . Leukocytosis 11/29/2014  . Osteoarthritis of left knee 11/20/2014  . History of ventral hernia repair 10/27/2014  . Allergic rhinitis 08/17/2014  . Axillary hidradenitis suppurativa 08/17/2014  . Baker cyst 08/17/2014  . Chronic constipation 08/17/2014  . 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 Engages in travel abroad 08/17/2014  . Fibromyalgia 08/17/2014  . Cardiac murmur 08/17/2014  . Dysmetabolic syndrome 08/17/2014  . Plantar fasciitis 08/17/2014  . Beat, premature ventricular 08/17/2014  . 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 Obstructive apnea 12/02/2013  . Essential (primary) hypertension 12/09/2009  . Hypertrichosis 12/09/2009    Luretha MurphySara R. Ilsa IhaSnyder, PT, DPT 11/20/19, 2:10 PM  [facility] [address] [place], [place], [id number] Phone: [phone]   Fax:  [phone]  Name: Kathleen Conley MRN: [id number] Date of Birth: January 25, 1971

## 2019-11-24 ENCOUNTER — Other Ambulatory Visit: Payer: Self-pay

## 2019-11-24 ENCOUNTER — Encounter: Payer: Self-pay | Admitting: Physical Therapy

## 2019-11-24 ENCOUNTER — Ambulatory Visit: Payer: 59 | Attending: Podiatry | Admitting: Physical Therapy

## 2019-11-24 DIAGNOSIS — M25572 Pain in left ankle and joints of left foot: Secondary | ICD-10-CM | POA: Diagnosis present

## 2019-11-24 DIAGNOSIS — R262 Difficulty in walking, not elsewhere classified: Secondary | ICD-10-CM | POA: Diagnosis present

## 2019-11-24 DIAGNOSIS — M6281 Muscle weakness (generalized): Secondary | ICD-10-CM | POA: Insufficient documentation

## 2019-11-24 NOTE — Therapy (Signed)
[facility] [address], [place], [address] Phone: [phone]   Fax:  [phone]  Physical Therapy Treatment  Patient Details  Name: Kathleen Conley MRN: [id number] Date of Birth: 1970-06-29 Referring Provider (PT):  [name], DPM   Encounter Date: 11/24/2019   PT End of Session - 11/24/19 1041    Visit Number 4    Number of Visits 24    Date for PT Re-Evaluation 01/27/20    Authorization Type Self pay reporting period from 11/04/2019    Progress Note Due on Visit 10    PT Start Time 1035    PT Stop Time 1115    PT Time Calculation (min) 40 min    Equipment Utilized During Treatment Other (comment)   single axial crutch and left cam boot   Activity Tolerance Patient tolerated treatment well    Behavior During Therapy [facility] for tasks assessed/performed           Past Medical History:  Diagnosis Date  . 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 Allergy   . Anemia    Iron Deficiency  . Arthritis   . Astigmatism 08/21/2017   Dr. Marcellus Scott  . Baker cyst   . Breast mass, right 01/18/2010  . Cardiac murmur   . Chronic constipation   . Depression   . Dysmetabolic syndrome   . Fibromyalgia   . Hairiness   . Hydradenitis   . Hypertension   . Incarcerated ventral hernia 09/29/2011  . 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 Marcellus Scott  . Myopia 08/21/2017   Dr. Marcellus Scott - [facility]  . Obesity   . Obstructive sleep apnea    no CPAP  . Open-angle glaucoma of both eyes, mild stage 08/21/2017   Dr. Marcellus Scott - [facility]  . Plantar fasciitis   . Pre-diabetes   . Presbyopia 07/02/219   Dr. Marcellus Scott  . PVC (premature ventricular contraction)     Past Surgical History:  Procedure Laterality Date  . ACHILLES TENDON SURGERY Left 08/15/2019   Procedure: TRIPLE ARTHRODESIS AND ACHILLES TENDON LENGTHENING LEFT FOOT;   Surgeon: [name], DPM;  Location: [facility] ORS;  Service: Podiatry;  Laterality: Left;  . FOOT ARTHRODESIS Right 12/01/2016   Procedure: ARTHRODESIS FOOT; TRIPLE;  Surgeon: [name], DPM;  Location: [facility] ORS;  Service: Podiatry;  Laterality: Right;  . HERNIA REPAIR  10/09/11   [facility] Incarcerated- Dr. [name]  . OOPHORECTOMY Left 12/09/2009  . TUBAL LIGATION  1993  . VENTRAL HERNIA REPAIR  10/09/2011    There were no vitals filed for this visit.   Subjective Assessment - 11/24/19 1039    Subjective Patient reports no pain upon arrival and comes in with cam boot on left lower leg and single axial crutch in the R side. Reports she looked everywhere and cannot find her left athletic shoe so she did not bring it. States her husband packed them up and they are lost right now. Will bring a new one next session. Reports she felt okay following last treatment session. HEP has been going well and she moved up to the green band.    Pertinent History Patient is a 49 y.o. female who presents to outpatient physical therapy with a referral for medical diagnosis equinus deformity L foot. This patient's chief complaints consist of pain, stiffness, loss of function and weightbearing tolerance second to achilles lengthening and  triple arthrodesis surgery to the L ankle on 08/15/2019, leading to the following functional deficits: Limited ambulatory distance, slower with ADLs and IADLs, not able to work, cannot do much housework. Relevant past medical history and comorbidities include hypertension, fibromyalgia (controlled with medication), hx of L knee problems (baker's cyst), chronic back pain (never down legs), obesity, hx of R ankle arthrodesis, myopia, depression (recently bad related to surgical situation, feels she has enough support), glaucoma of both eyes, arthritis, cardiac murmur, dysmetabolic syndrome. Patient denies hx of cancer, stroke, seizures, lung problem, major cardiac events, diabetes, unexplained  weight loss, changes in bowel or bladder problems, new onset stumbling or dropping things.    Limitations House hold activities;Standing;Walking;Lifting;Other (comment)   Limited ambulatory distance, slower with ADLs and IADLs, not able to work, cannot do much housework   How long can you sit comfortably? no limited by this problem    How long can you stand comfortably? 5-10 min (brush teeth, comb hair)    How long can you walk comfortably? 2 min    Diagnostic tests Radiograph 10/30/2019: "AP oblique and lateral of the left foot reveals surgical arthrodesis of   the talonavicular, calcaneocuboid, and subtalar joint.  Solid arthrodesis   noted in all 3 views.  No signs of motion to the hardware.  Good bone   consolidation across the fusion sites are noted.  Remainder the foot x-ray   is negative for acute fracture"    Patient Stated Goals return to work, return to walking on her own without problems, be able to complete her usual activities as she did prior to surgery    Currently in Pain? No/denies    Pain Onset 1 to 4 weeks ago           TREATMENT:  Therapeutic exercise:to centralize symptoms and improve ROM, strength, muscular endurance, and activity tolerance required for successful completion of functional activities. -NuStep level5using bilateral lower extremities. Seat setting7. NO CAM BOOT OR SHOES. For improved extremity mobility, muscular endurance, and activity tolerance; and to induce the analgesic effect of aerobic exercise, stimulate improved joint nutrition, and prepare body structures and systems for following interventions. X89minutes. Average SPM = 69 - ambulation x 20 feet with no CAM boot and one axial crutch under R arm. Step through pattern, decreased weight shift to L -Seated left ankle BAPS board, plantarflexion/dorsiflexion (level3), inversion/eversion (lvl2), circles clockwise (lvl2), circles counter clockwise (lvl2), 20 reps each. - seated L ankle PF  againstblue theraband 3x 20 - L Toe splays, repeated 2 sec holds. Ball of foot and heel maintains contact with floor. To improve intrinsic foot muscle activation and strength in order to better support arch and intrinsic foot structures. x20 - L Toe Yoga: great toe extension with small toes flexion pressure into floor, repeated 2 sec holds; small toes extension with great toe flexion pressure into floor, repeated 2 sec holds. Ball of foot and heel maintains contact with floor. To improve intrinsic foot muscle activation and strength in order to better support arch and intrinsic foot structures. x20 each - seated L ankle gastroc stretch withgait belt,3x30 seconds - sit <> stand from 19.5 inch plinth, 3x10, no UE support or shoes.  - seated L ankle inversion towel scoot on slick surface (one standard towel's worth).   HOME EXERCISE PROGRAM Access Code: [email or web address] URL: [email or web address] Date: 11/04/2019 Prepared by: Norton Blizzard  Exercises Seated Ankle Alphabet - 2 x daily - 1 sets Seated Ankle Plantar Flexion with Resistance Loop - 2 x  daily - 2 sets - 20 reps Leg stretch - 2 x daily - 3 reps - 30 seconds hold Seated Soleus Stretch with Strap - 2 x daily - 3 reps - 30 hold    PT Education - 11/24/19 1041    Education Details Exercise purpose/form. Self management techniques    Person(s) Educated Patient    Methods Explanation;Demonstration;Tactile cues;Verbal cues    Comprehension Verbalized understanding;Returned demonstration;Verbal cues required;Tactile cues required;Need further instruction            PT Short Term Goals - 11/20/19 1258      PT SHORT TERM GOAL #1   Title Be independent with initial home exercise program for self-management of symptoms.    Baseline initial HEP provided at IE (11/04/2019);    Time 2    Status Achieved    Target Date 11/18/19             PT Long Term Goals - 11/04/19 1739      PT LONG TERM GOAL #1   Title Demonstrate  improved FOTO score to equal or greater than 53 by visit #16 to demonstrate improvement in overall condition and self-reported functional ability.    Baseline 32 (11/04/2019);    Time 12    Period Weeks    Status New   TARGET DATE FOR ALL LONG TERM GOALS: 01/27/2020     PT LONG TERM GOAL #2   Title Pt will ambulate equal or greater than 10000 feet on the 6 Minute Walk Test with minimal>no gait abnormalities to return to walking for exercise and work.    Baseline ambulates 100 feet to car with CAM boot and step too pattern PWB on B axial crutches (11/04/2019);    Time 12    Period Weeks    Status New      PT LONG TERM GOAL #3   Title Pt will be able to stand for at least 30 minutes without pain for return to work    Baseline 5-10 min before needing to sit down due to pain (11/04/2019);    Time 12    Period Weeks    Status New      PT LONG TERM GOAL #4   Title Pt's L ankle AROM will improve to [facility]WFL for improved functional use of LLE during ambulation and squatting.    Baseline from neutral DF: 0d PF: 35d    Time 12    Period Weeks    Status New      PT LONG TERM GOAL #5   Title Be independent with a long-term home exercise program for self-management of symptoms.    Baseline initial HEP provided at IE (11/04/2019);    Time 12    Period Weeks    Status New                 Plan - 11/24/19 1500    Clinical Impression Statement Patient tolerated treatment well overall and was again able to move up in band resistance. Initiated some gentle weight bearing without the boot but did not progress as far due to no athletic shoe available. Continues to improve with good pain control. Plan to progress to more weight bearing activities next session with athletic shoe. Patient would benefit from continued management of limiting condition by skilled physical therapist to address remaining impairments and functional limitations to work towards stated goals and return to PLOF or maximal functional  independence.    Personal Factors and Comorbidities Age;Comorbidity 3+;Past/Current  Experience;Fitness;Time since onset of injury/illness/exacerbation;Profession    Comorbidities Relevant past medical history and comorbidities include hypertension, fibromyalgia (controlled with medication), hx of L knee problems (baker's cyst), chronic back pain (never down legs), obesity, hx of R ankle arthrodesis, myopia, depression (recently bad related to surgical situation, feels she has enough support), glaucoma of both eyes, arthritis, cardiac murmur, dysmetabolic syndrome    Examination-Activity Limitations Squat;Stairs;Locomotion Level;Stand;Bend;Caring for Others;Carry;Transfers;Dressing   Limited ambulatory distance, slower with ADLs and IADLs, not able to work, cannot do much housework   Examination-Participation Restrictions Church;Laundry;Cleaning;Shop;Community Activity;Meal Prep;Driving;Occupation;Yard Work    Stability/Clinical Decision Making Stable/Uncomplicated    Rehab Potential Good    PT Frequency 2x / week    PT Duration 12 weeks    PT Treatment/Interventions ADLs/Self Care Home Management;Aquatic Therapy;Biofeedback;Cryotherapy;Electrical Stimulation;Moist Heat;DME Instruction;Gait Network engineer;Therapeutic activities;Therapeutic exercise;Balance training;Neuromuscular re-education;Patient/family education;Orthotic Fit/Training;Manual techniques;Compression bandaging;Scar mobilization;Passive range of motion;Dry needling;Energy conservation;Taping;Spinal Manipulations;Joint Manipulations    PT Next Visit Plan progressive strengthening, weight bearing, functional activities    PT Home Exercise Plan Medbridge: Access Code: [email or web address]    Consulted and Agree with Plan of Care Patient           Patient will benefit from skilled therapeutic intervention in order to improve the following deficits and impairments:  Abnormal gait, Decreased activity tolerance, Decreased balance, Decreased  endurance, Decreased knowledge of precautions, Decreased knowledge of use of DME, Decreased mobility, Decreased range of motion, Decreased scar mobility, Decreased strength, Difficulty walking, Hypomobility, Increased edema, Impaired perceived functional ability, Impaired flexibility, Improper body mechanics, Postural dysfunction, Obesity, Pain, Decreased skin integrity  Visit Diagnosis: Pain in left ankle and joints of left foot  Muscle weakness (generalized)  Difficulty in walking, not elsewhere classified     Problem List Patient Active Problem List   Diagnosis Date Noted  . Morbid obesity (HCC) 04/17/2018  . 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 Flat foot 01/28/2016  . Aortic sclerosis 10/14/2015  . Midline low back pain without sciatica 09/20/2015  . Leukocytosis 11/29/2014  . Osteoarthritis of left knee 11/20/2014  . History of ventral hernia repair 10/27/2014  . Allergic rhinitis 08/17/2014  . Axillary hidradenitis suppurativa 08/17/2014  . Baker cyst 08/17/2014  . Chronic constipation 08/17/2014  . 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 Engages in travel abroad 08/17/2014  . Fibromyalgia 08/17/2014  . Cardiac murmur 08/17/2014  . Dysmetabolic syndrome 08/17/2014  . Plantar fasciitis 08/17/2014  . Beat, premature ventricular 08/17/2014  . 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 Obstructive apnea 12/02/2013  . Essential (primary) hypertension 12/09/2009  . Hypertrichosis 12/09/2009    Luretha Murphy. Ilsa Iha, PT, DPT 11/24/19, 3:01 PM  [place] [facility] PHYSICAL AND SPORTS MEDICINE [address] [place], [address] Phone: [phone]   Fax:  [phone]  Name: Kathleen Conley MRN: [id number] Date of Birth: 1970/09/27

## 2019-11-26 ENCOUNTER — Encounter: Payer: Self-pay | Admitting: Physical Therapy

## 2019-11-26 ENCOUNTER — Ambulatory Visit: Payer: 59 | Admitting: Physical Therapy

## 2019-11-26 ENCOUNTER — Other Ambulatory Visit: Payer: Self-pay

## 2019-11-26 DIAGNOSIS — M25572 Pain in left ankle and joints of left foot: Secondary | ICD-10-CM | POA: Diagnosis not present

## 2019-11-26 DIAGNOSIS — R262 Difficulty in walking, not elsewhere classified: Secondary | ICD-10-CM

## 2019-11-26 DIAGNOSIS — M6281 Muscle weakness (generalized): Secondary | ICD-10-CM

## 2019-11-26 NOTE — Therapy (Signed)
[facility] PHYSICAL AND SPOR[facility] MEDICINE [address] [place], [address] Phone: [phone]   Fax:  [phone]  Physical Therapy Treatment  Patient Details  Name: Kathleen Conley MRN: [id number] Date of Birth: [birth year] Referring Provider (PT):  [name], DPM   Encounter Date: 11/26/2019   PT End of Session - 11/26/19 1528    Visit Number 5    Number of Visits 24    Date for PT Re-Evaluation 01/27/20    Authorization Type Self pay reporting period from 11/04/2019    Progress Note Due on Visit 10    PT Start Time 1435    PT Stop Time 1515    PT Time Calculation (min) 40 min    Equipment Utilized During Treatment Other (comment)   single axial crutch   Activity Tolerance Patient tolerated treatment well    Behavior During Therapy [facility] for tasks assessed/performed           Past Medical History:  Diagnosis Date  . 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 Allergy   . Anemia    Iron Deficiency  . Arthritis   . Astigmatism 08/21/2017   Dr. Marcellus Scott  . Baker cyst   . Breast mass, right 01/18/2010  . Cardiac murmur   . Chronic constipation   . Depression   . Dysmetabolic syndrome   . Fibromyalgia   . Hairiness   . Hydradenitis   . Hypertension   . Incarcerated ventral hernia 09/29/2011  . 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 Marcellus Scott  . Myopia 08/21/2017   Dr. Marcellus Scott - [facility]  . Obesity   . Obstructive sleep apnea    no CPAP  . Open-angle glaucoma of both eyes, mild stage 08/21/2017   Dr. Marcellus Scott - [facility]  . Plantar fasciitis   . Pre-diabetes   . Presbyopia 07/02/219   Dr. Marcellus Scott  . PVC (premature ventricular contraction)     Past Surgical History:  Procedure Laterality Date  . ACHILLES TENDON SURGERY Left 08/15/2019   Procedure: TRIPLE ARTHRODESIS AND ACHILLES TENDON LENGTHENING LEFT FOOT;  Surgeon: [name], DPM;  Location: [facility] ORS;  Service: Podiatry;  Laterality: Left;  . FOOT ARTHRODESIS Right 12/01/2016   Procedure: ARTHRODESIS FOOT; TRIPLE;  Surgeon: [name], DPM;  Location: [facility] ORS;  Service: Podiatry;  Laterality: Right;  . HERNIA REPAIR  10/09/11   [facility] Incarcerated- Dr. [name]  . OOPHORECTOMY Left 12/09/2009  . TUBAL LIGATION  1993  . VENTRAL HERNIA REPAIR  10/09/2011    There were no vitals filed for this visit.   Subjective Assessment - 11/26/19 1437    Subjective Pateint reports no pain upon arrival. Was a little sore in the L ankle following last treatment session but nothing she is worried about. Arrives today with athletic shoes on B feet and single axial crutch on the right side. HEP yesterday went well. Started wearing shoes yesterday about 1 hour yesterday without a problem.    Pertinent History Patient is a 49 y.o. female who presents to outpatient physical therapy with a referral for medical diagnosis equinus deformity L foot. This patient's chief complaints consist of pain, stiffness, loss of function and weightbearing tolerance second to achilles lengthening and triple arthrodesis surgery to the L ankle on 08/15/2019, leading to the following functional deficits: Limited ambulatory distance, slower with ADLs and IADLs, not able to work, cannot do much  housework. Relevant past medical history and comorbidities include hypertension, fibromyalgia (controlled with medication), hx of L knee problems (baker's cyst), chronic back pain (never down legs), obesity, hx of R ankle arthrodesis, myopia, depression (recently bad related to surgical situation, feels she has enough support), glaucoma of both eyes, arthritis, cardiac murmur, dysmetabolic syndrome. Patient denies hx of cancer, stroke, seizures, lung problem, major cardiac events, diabetes, unexplained weight loss, changes in bowel or bladder problems, new onset stumbling or dropping things.    Limitations House hold  activities;Standing;Walking;Lifting;Other (comment)   Limited ambulatory distance, slower with ADLs and IADLs, not able to work, cannot do much housework   How long can you sit comfortably? no limited by this problem    How long can you stand comfortably? 5-10 min (brush teeth, comb hair)    How long can you walk comfortably? 2 min    Diagnostic tests Radiograph 10/30/2019: "AP oblique and lateral of the left foot reveals surgical arthrodesis of   the talonavicular, calcaneocuboid, and subtalar joint.  Solid arthrodesis   noted in all 3 views.  No signs of motion to the hardware.  Good bone   consolidation across the fusion sites are noted.  Remainder the foot x-ray   is negative for acute fracture"    Patient Stated Goals return to work, return to walking on her own without problems, be able to complete her usual activities as she did prior to surgery    Currently in Pain? No/denies           TREATMENT:  Therapeutic exercise:to centralize symptoms and improve ROM, strength, muscular endurance, and activity tolerance required for successful completion of functional activities. - standing hip abduction 3x10 each side with BUE support.  - sit <> stand from 19.5 inch plinth, 3x10, no UE support  - standing L ankle gastroc stretch on floor,3x30 seconds - standing L ankle soleus stretch on floor,3x30 seconds - seated L ankle inversion towel scoot on slick surface, 3x length of standard towel. PT held heel still.  - seated L ankle PF [email or web address] 20  Neuromuscular Re-education: to improve, balance, postural strength, muscle activation patterns, and stabilization strength required for functional activities: - SLS on floor, 3x30 seconds each side (L with full B UE support, R with 1 finger each).   HOME EXERCISE PROGRAM Access Code: [email or web address] URL: [email or web address] Date: 11/26/2019 Prepared by: Norton Blizzard  Exercises Sit to Stand without Arm Support - 1 x  daily - 3 sets - 10 reps Standing Hip Abduction with Anterior Support - 1 x daily - 3 sets - 10 reps Standing Single Leg Stance with Counter Support - 1 x daily - 3 sets - 30 seconds hold Seated Ankle Plantar Flexion with Resistance Loop - 2 x daily - 2 sets - 20 reps Standing Soleus Stretch - 2 x daily - 3 sets - 30 seconds hold Standing Gastroc Stretch - 2 x daily - 3 sets - 30 hold     PT Education - 11/26/19 1438    Education Details Exercise purpose/form. Self management techniques    Person(s) Educated Patient    Methods Explanation;Demonstration;Tactile cues;Verbal cues    Comprehension Verbalized understanding;Returned demonstration;Verbal cues required;Tactile cues required;Need further instruction            PT Short Term Goals - 11/20/19 1258      PT SHORT TERM GOAL #1   Title Be independent with initial home exercise program for self-management of symptoms.    Baseline initial HEP provided at  IE (11/04/2019);    Time 2    Status Achieved    Target Date 11/18/19             PT Long Term Goals - 11/04/19 1739      PT LONG TERM GOAL #1   Title Demonstrate improved FOTO score to equal or greater than 53 by visit #16 to demonstrate improvement in overall condition and self-reported functional ability.    Baseline 32 (11/04/2019);    Time 12    Period Weeks    Status New   TARGET DATE FOR ALL LONG TERM GOALS: 01/27/2020     PT LONG TERM GOAL #2   Title Pt will ambulate equal or greater than 10000 feet on the 6 Minute Walk Test with minimal>no gait abnormalities to return to walking for exercise and work.    Baseline ambulates 100 feet to car with CAM boot and step too pattern PWB on B axial crutches (11/04/2019);    Time 12    Period Weeks    Status New      PT LONG TERM GOAL #3   Title Pt will be able to stand for at least 30 minutes without pain for return to work    Baseline 5-10 min before needing to sit down due to pain (11/04/2019);    Time 12    Period  Weeks    Status New      PT LONG TERM GOAL #4   Title Pt's L ankle AROM will improve to [facility] for improved functional use of LLE during ambulation and squatting.    Baseline from neutral DF: 0d PF: 35d    Time 12    Period Weeks    Status New      PT LONG TERM GOAL #5   Title Be independent with a long-term home exercise program for self-management of symptoms.    Baseline initial HEP provided at IE (11/04/2019);    Time 12    Period Weeks    Status New                 Plan - 11/26/19 1523    Clinical Impression Statement Patient tolerated treatment well overall with mild increase in pain to 3/10 (patient reports "not bad"). Advised ice when she gets home and to re-apply her cam boot until tomorrow to continue with gradual weaning of boot. Noted for fairly good dorsiflexion ROM today in gastroc and soleus stretch. Does compensate with toe flexors when attempting to plantar flex against band. Patient would benefit from continued management of limiting condition by skilled physical therapist to address remaining impairments and functional limitations to work towards stated goals and return to PLOF or maximal functional independence.    Personal Factors and Comorbidities Age;Comorbidity 3+;Past/Current Experience;Fitness;Time since onset of injury/illness/exacerbation;Profession    Comorbidities Relevant past medical history and comorbidities include hypertension, fibromyalgia (controlled with medication), hx of L knee problems (baker's cyst), chronic back pain (never down legs), obesity, hx of R ankle arthrodesis, myopia, depression (recently bad related to surgical situation, feels she has enough support), glaucoma of both eyes, arthritis, cardiac murmur, dysmetabolic syndrome    Examination-Activity Limitations Squat;Stairs;Locomotion Level;Stand;Bend;Caring for Others;Carry;Transfers;Dressing   Limited ambulatory distance, slower with ADLs and IADLs, not able to work, cannot do much  housework   Examination-Participation Restrictions Church;Laundry;Cleaning;Shop;Community Activity;Meal Prep;Driving;Occupation;Yard Work    Stability/Clinical Decision Making Stable/Uncomplicated    Rehab Potential Good    PT Frequency 2x / week    PT Duration  12 weeks    PT Treatment/Interventions ADLs/Self Care Home Management;Aquatic Therapy;Biofeedback;Cryotherapy;Electrical Stimulation;Moist Heat;DME Instruction;Gait Network engineertraining;Stair training;Therapeutic activities;Therapeutic exercise;Balance training;Neuromuscular re-education;Patient/family education;Orthotic Fit/Training;Manual techniques;Compression bandaging;Scar mobilization;Passive range of motion;Dry needling;Energy conservation;Taping;Spinal Manipulations;Joint Manipulations    PT Next Visit Plan progressive strengthening, weight bearing, functional activities    PT Home Exercise Plan Medbridge: Access Code: [email or web address]    Consulted and Agree with Plan of Care Patient           Patient will benefit from skilled therapeutic intervention in order to improve the following deficits and impairments:  Abnormal gait, Decreased activity tolerance, Decreased balance, Decreased endurance, Decreased knowledge of precautions, Decreased knowledge of use of DME, Decreased mobility, Decreased range of motion, Decreased scar mobility, Decreased strength, Difficulty walking, Hypomobility, Increased edema, Impaired perceived functional ability, Impaired flexibility, Improper body mechanics, Postural dysfunction, Obesity, Pain, Decreased skin integrity  Visit Diagnosis: Pain in left ankle and joints of left foot  Muscle weakness (generalized)  Difficulty in walking, not elsewhere classified     Problem List Patient Active Problem List   Diagnosis Date Noted  . Morbid obesity (HCC) 04/17/2018  . 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 Flat foot 01/28/2016  . Aortic sclerosis 10/14/2015  . Midline low back pain without sciatica  09/20/2015  . Leukocytosis 11/29/2014  . Osteoarthritis of left knee 11/20/2014  . History of ventral hernia repair 10/27/2014  . Allergic rhinitis 08/17/2014  . Axillary hidradenitis suppurativa 08/17/2014  . Baker cyst 08/17/2014  . Chronic constipation 08/17/2014  . 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 Engages in travel abroad 08/17/2014  . Fibromyalgia 08/17/2014  . Cardiac murmur 08/17/2014  . Dysmetabolic syndrome 08/17/2014  . Plantar fasciitis 08/17/2014  . Beat, premature ventricular 08/17/2014  . 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 Obstructive apnea 12/02/2013  . Essential (primary) hypertension 12/09/2009  . Hypertrichosis 12/09/2009    Luretha MurphySara R. Ilsa IhaSnyder, PT, DPT 11/26/19, 3:29 PM  [facility] PH[facility]ICINE [address] [place], [place], [address] Phone: [phone]   Fax:  [phone]  Name: Elvera Marializabeth Sweaney MRN: [id number] Date of Birth: 1970/07/13

## 2019-12-01 ENCOUNTER — Ambulatory Visit: Payer: 59 | Admitting: Physical Therapy

## 2019-12-03 ENCOUNTER — Other Ambulatory Visit: Payer: Self-pay

## 2019-12-03 ENCOUNTER — Ambulatory Visit: Payer: 59 | Admitting: Physical Therapy

## 2019-12-03 DIAGNOSIS — M25572 Pain in left ankle and joints of left foot: Secondary | ICD-10-CM | POA: Diagnosis not present

## 2019-12-03 DIAGNOSIS — M6281 Muscle weakness (generalized): Secondary | ICD-10-CM

## 2019-12-03 DIAGNOSIS — R262 Difficulty in walking, not elsewhere classified: Secondary | ICD-10-CM

## 2019-12-03 NOTE — Therapy (Signed)
[facility] [address] [place], [address] Phone: [phone]   Fax:  [phone]  Physical Therapy Treatment  Patient Details  Name: Kathleen Conley MRN: [id number] Date of Birth: Jul 10, 1970 Referring Provider (PT):  [name], DPM   Encounter Date: 12/03/2019   PT End of Session - 12/03/19 1114    Visit Number 6    Number of Visits 24    Date for PT Re-Evaluation 01/27/20    Authorization Type Self pay reporting period from 11/04/2019    Progress Note Due on Visit 10    PT Start Time 0900    PT Stop Time 0945    PT Time Calculation (min) 45 min    Equipment Utilized During Treatment Other (comment)   single axial crutch   Activity Tolerance Patient tolerated treatment well    Behavior During Therapy [facility] for tasks assessed/performed           Past Medical History:  Diagnosis Date  . 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 Allergy   . Anemia    Iron Deficiency  . Arthritis   . Astigmatism 08/21/2017   Dr. Marcellus Scott  . Baker cyst   . Breast mass, right 01/18/2010  . Cardiac murmur   . Chronic constipation   . Depression   . Dysmetabolic syndrome   . Fibromyalgia   . Hairiness   . Hydradenitis   . Hypertension   . Incarcerated ventral hernia 09/29/2011  . 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 Marcellus Scott  . Myopia 08/21/2017   Dr. Marcellus Scott - [facility]  . Obesity   . Obstructive sleep apnea    no CPAP  . Open-angle glaucoma of both eyes, mild stage 08/21/2017   Dr. Marcellus Scott - [facility]  . Plantar fasciitis   . Pre-diabetes   . Presbyopia 07/02/219   Dr. Marcellus Scott  . PVC (premature ventricular contraction)     Past Surgical History:  Procedure Laterality Date  . ACHILLES TENDON SURGERY Left 08/15/2019   Procedure: TRIPLE ARTHRODESIS AND ACHILLES TENDON LENGTHENING LEFT FOOT;  Surgeon: [name], DPM;  Location: [facility] ORS;  Service: Podiatry;  Laterality: Left;  . FOOT ARTHRODESIS Right 12/01/2016   Procedure: ARTHRODESIS FOOT; TRIPLE;  Surgeon: [name], DPM;  Location: [facility] OR[facility];  Service: Podiatry;  Laterality: Right;  . HERNIA REPAIR  10/09/11   [facility] Incarcerated- Dr. [name]  . OOPHORECTOMY Left 12/09/2009  . TUBAL LIGATION  1993  . VENTRAL HERNIA REPAIR  10/09/2011    There were no vitals filed for this visit.   Subjective Assessment - 12/03/19 0923    Subjective Patient reports she is feeling well today. Arrives with single axial crutch and athletic shoes on both feet. States her pain is 3/10 "not bad" for no apparent reason this morning. States HEP is going well and she doesn't really use the boot any longer but doesn't do much, mostly staying at home. Felt okay following last treatment session.    Pertinent History Patient is a 49 y.o. female who presents to outpatient physical therapy with a referral for medical diagnosis equinus deformity L foot. This patient's chief complaints consist of pain, stiffness, loss of function and weightbearing tolerance second to achilles lengthening and triple arthrodesis surgery to the L ankle on 08/15/2019, leading to the following functional deficits: Limited ambulatory distance, slower with ADLs and IADLs, not able to  work, cannot do much housework. Relevant past medical history and comorbidities include hypertension, fibromyalgia (controlled with medication), hx of L knee problems (baker's cyst), chronic back pain (never down legs), obesity, hx of R ankle arthrodesis, myopia, depression (recently bad related to surgical situation, feels she has enough support), glaucoma of both eyes, arthritis, cardiac murmur, dysmetabolic syndrome. Patient denies hx of cancer, stroke, seizures, lung problem, major cardiac events, diabetes, unexplained weight loss, changes in bowel or bladder problems, new onset stumbling or dropping things.     Limitations House hold activities;Standing;Walking;Lifting;Other (comment)   Limited ambulatory distance, slower with ADLs and IADLs, not able to work, cannot do much housework   How long can you sit comfortably? no limited by this problem    How long can you stand comfortably? 5-10 min (brush teeth, comb hair)    How long can you walk comfortably? 2 min    Diagnostic tests Radiograph 10/30/2019: "AP oblique and lateral of the left foot reveals surgical arthrodesis of   the talonavicular, calcaneocuboid, and subtalar joint.  Solid arthrodesis   noted in all 3 views.  No signs of motion to the hardware.  Good bone   consolidation across the fusion sites are noted.  Remainder the foot x-ray   is negative for acute fracture"    Patient Stated Goals return to work, return to walking on her own without problems, be able to complete her usual activities as she did prior to surgery    Currently in Pain? Yes    Pain Score 3     Pain Location Ankle    Pain Orientation Left;Lateral           TREATMENT:  Therapeutic exercise:to centralize symptoms and improve ROM, strength, muscular endurance, and activity tolerance required for successful completion of functional activities. -NuStep level4using bilateral lower extremities. Seat setting8.  For improved extremity mobility, muscular endurance, and activity tolerance; and to induce the analgesic effect of aerobic exercise, stimulate improved joint nutrition, and prepare body structures and systems for following interventions. X5 minutes. Average SPM = 82 - standing hip abduction 3x10 each side with BUE support. (last set with yellow theraband around ankles).  - lateral step up at 6 inch step with BUE support on railing, 1x10 each side.  - forward runner's step up at 6 inch step with BUE support, 2x10 each side.  - alternating forward mini lunge, 2x10 each side, touched unilateral UE support as needed. (did not complete last set due to back pain).  -  Education on HEP including handout   Neuromuscular Re-education: to improve, balance, postural strength, muscle activation patterns, and stabilization strength required for functional activities: - semi-narrow stance balance on airex while drawing in the air with both hands pressed together using large movements. 1xAlphabet forwards, 1xAlphabet backwards. CGA - min A to prevent falls.  - tandem walking forward and backwards with unilateral support on table 3x8 feet each way.   HOME EXERCISE PROGRAM Access Code: [email or web address] URL: [email or web address] Date: 12/03/2019 Prepared by: Norton Blizzard  Exercises Standing Single Leg Stance with Counter Support - 1 x daily - 3 sets - 30 seconds hold Seated Ankle Plantar Flexion with Resistance Loop - 2 x daily - 2 sets - 20 reps Standing Soleus Stretch - 2 x daily - 3 sets - 30 seconds hold Standing Gastroc Stretch - 2 x daily - 3 sets - 30 hold Standing hip abduction with band around ankles - 1 x daily - 3-4 x weekly - 3 sets -  10 reps Runner's Step Up/Down - 3-4 x weekly - 3 sets - 10 reps Mini Lunge - 3-4 x weekly - 2-3 sets - 10 reps Tandem Walking with Counter Support - 3-4 x weekly - 2 sets - 5 reps Backward Tandem Walking with Counter Support - 3-4 x weekly - 2 sets - 5 reps    PT Education - 12/03/19 1114    Education Details Exercise purpose/form. Self management techniques    Person(s) Educated Patient    Methods Explanation;Demonstration;Tactile cues;Verbal cues;Handout    Comprehension Verbalized understanding;Returned demonstration;Verbal cues required;Tactile cues required;Need further instruction            PT Short Term Goals - 11/20/19 1258      PT SHORT TERM GOAL #1   Title Be independent with initial home exercise program for self-management of symptoms.    Baseline initial HEP provided at IE (11/04/2019);    Time 2    Status Achieved    Target Date 11/18/19             PT Long Term Goals - 11/04/19  1739      PT LONG TERM GOAL #1   Title Demonstrate improved FOTO score to equal or greater than 53 by visit #16 to demonstrate improvement in overall condition and self-reported functional ability.    Baseline 32 (11/04/2019);    Time 12    Period Weeks    Status New   TARGET DATE FOR ALL LONG TERM GOALS: 01/27/2020     PT LONG TERM GOAL #2   Title Pt will ambulate equal or greater than 10000 feet on the 6 Minute Walk Test with minimal>no gait abnormalities to return to walking for exercise and work.    Baseline ambulates 100 feet to car with CAM boot and step too pattern PWB on B axial crutches (11/04/2019);    Time 12    Period Weeks    Status New      PT LONG TERM GOAL #3   Title Pt will be able to stand for at least 30 minutes without pain for return to work    Baseline 5-10 min before needing to sit down due to pain (11/04/2019);    Time 12    Period Weeks    Status New      PT LONG TERM GOAL #4   Title Pt's L ankle AROM will improve to [facility] for improved functional use of LLE during ambulation and squatting.    Baseline from neutral DF: 0d PF: 35d    Time 12    Period Weeks    Status New      PT LONG TERM GOAL #5   Title Be independent with a long-term home exercise program for self-management of symptoms.    Baseline initial HEP provided at IE (11/04/2019);    Time 12    Period Weeks    Status New                 Plan - 12/03/19 1114    Clinical Impression Statement Patient tolerated treatment well overall with report of decreased ankle pian by end of session. Found exercises very challenging and required heavy UE support for most of the functional lower extremity strength exercises. Updated HEP to reflect goal of reducing clinic visit frequency to 1x a week. Patient did have some back pain that she reports is normal for her and continues to have excessive sweating pouring down her face with minimal exertion that bothers  her during the session. Educated patient on how  she can start grading activities at home to work towards being able to sustain the activity needed to return to work. Patient would benefit from continued management of limiting condition by skilled physical therapist to address remaining impairments and functional limitations to work towards stated goals and return to PLOF or maximal functional independence.    Personal Factors and Comorbidities Age;Comorbidity 3+;Past/Current Experience;Fitness;Time since onset of injury/illness/exacerbation;Profession    Comorbidities Relevant past medical history and comorbidities include hypertension, fibromyalgia (controlled with medication), hx of L knee problems (baker's cyst), chronic back pain (never down legs), obesity, hx of R ankle arthrodesis, myopia, depression (recently bad related to surgical situation, feels she has enough support), glaucoma of both eyes, arthritis, cardiac murmur, dysmetabolic syndrome    Examination-Activity Limitations Squat;Stairs;Locomotion Level;Stand;Bend;Caring for Others;Carry;Transfers;Dressing   Limited ambulatory distance, slower with ADLs and IADLs, not able to work, cannot do much housework   Examination-Participation Restrictions Church;Laundry;Cleaning;Shop;Community Activity;Meal Prep;Driving;Occupation;Yard Work    Stability/Clinical Decision Making Stable/Uncomplicated    Rehab Potential Good    PT Frequency 2x / week    PT Duration 12 weeks    PT Treatment/Interventions ADLs/Self Care Home Management;Aquatic Therapy;Biofeedback;Cryotherapy;Electrical Stimulation;Moist Heat;DME Instruction;Gait Network engineertraining;Stair training;Therapeutic activities;Therapeutic exercise;Balance training;Neuromuscular re-education;Patient/family education;Orthotic Fit/Training;Manual techniques;Compression bandaging;Scar mobilization;Passive range of motion;Dry needling;Energy conservation;Taping;Spinal Manipulations;Joint Manipulations    PT Next Visit Plan progressive strengthening, weight  bearing, functional activities    PT Home Exercise Plan Medbridge: Access Code: [email or web address]    Consulted and Agree with Plan of Care Patient           Patient will benefit from skilled therapeutic intervention in order to improve the following deficits and impairments:  Abnormal gait, Decreased activity tolerance, Decreased balance, Decreased endurance, Decreased knowledge of precautions, Decreased knowledge of use of DME, Decreased mobility, Decreased range of motion, Decreased scar mobility, Decreased strength, Difficulty walking, Hypomobility, Increased edema, Impaired perceived functional ability, Impaired flexibility, Improper body mechanics, Postural dysfunction, Obesity, Pain, Decreased skin integrity  Visit Diagnosis: Pain in left ankle and joints of left foot  Muscle weakness (generalized)  Difficulty in walking, not elsewhere classified     Problem List Patient Active Problem List   Diagnosis Date Noted  . Morbid obesity (HCC) 04/17/2018  . 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 Flat foot 01/28/2016  . Aortic sclerosis 10/14/2015  . Midline low back pain without sciatica 09/20/2015  . Leukocytosis 11/29/2014  . Osteoarthritis of left knee 11/20/2014  . History of ventral hernia repair 10/27/2014  . Allergic rhinitis 08/17/2014  . Axillary hidradenitis suppurativa 08/17/2014  . Baker cyst 08/17/2014  . Chronic constipation 08/17/2014  . 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 Engages in travel abroad 08/17/2014  . Fibromyalgia 08/17/2014  . Cardiac murmur 08/17/2014  . Dysmetabolic syndrome 08/17/2014  . Plantar fasciitis 08/17/2014  . Beat, premature ventricular 08/17/2014  . 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 Obstructive apnea 12/02/2013  . Essential (primary) hypertension 12/09/2009  . Hypertrichosis 12/09/2009   Luretha MurphySara R. Ilsa IhaSnyder, PT, DPT 12/03/19, 11:16 AM  [facility] [address] [place], [place], [id number] Phone: [phone]   Fax:  [phone]  Name: Kathleen Conley MRN: [id number] Date of Birth: 1970-07-22

## 2019-12-09 ENCOUNTER — Ambulatory Visit: Payer: 59 | Admitting: Physical Therapy

## 2019-12-09 ENCOUNTER — Telehealth: Payer: Self-pay | Admitting: Physical Therapy

## 2019-12-09 NOTE — Telephone Encounter (Signed)
Called patient when she did not show up for her appointment at 9:45 this morning. No answer. Left voicemail informing her of the missed appointment and offering reschedule. Requested call back to reschedule at [phone]  Luretha Murphy. Ilsa Iha, PT, DPT 12/09/19, 10:05 AM

## 2019-12-10 ENCOUNTER — Encounter: Payer: Self-pay | Admitting: Physical Therapy

## 2019-12-10 ENCOUNTER — Other Ambulatory Visit: Payer: Self-pay

## 2019-12-10 ENCOUNTER — Ambulatory Visit: Payer: 59 | Admitting: Physical Therapy

## 2019-12-10 DIAGNOSIS — M25572 Pain in left ankle and joints of left foot: Secondary | ICD-10-CM | POA: Diagnosis not present

## 2019-12-10 DIAGNOSIS — M6281 Muscle weakness (generalized): Secondary | ICD-10-CM

## 2019-12-10 DIAGNOSIS — R262 Difficulty in walking, not elsewhere classified: Secondary | ICD-10-CM

## 2019-12-10 NOTE — Therapy (Signed)
[facility] PHYSICAL AND SPORTS MEDICINE [address] [place], [place], [address] Phone: [phone]   Fax:  [phone]  Physical Therapy Treatment  Patient Details  Name: Kathleen Conley MRN: [id number] Date of Birth: 11-Jan-1971 Referring Provider (PT):  [name], DPM   Encounter Date: 12/10/2019   PT End of Session - 12/10/19 1224    Visit Number 7    Number of Visits 24    Date for PT Re-Evaluation 01/27/20    Authorization Type Self pay reporting period from 11/04/2019    Progress Note Due on Visit 10    PT Start Time 1044    PT Stop Time 1115    PT Time Calculation (min) 31 min    Activity Tolerance Patient limited by pain   in the back   Behavior [facility] for tasks assessed/performed           Past Medical History:  Diagnosis Date  . 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 Allergy   . Anemia    Iron Deficiency  . Arthritis   . Astigmatism 08/21/2017   Dr. Marcellus ScottErin Jackson  . Baker cyst   . Breast mass, right 01/18/2010  . Cardiac murmur   . Chronic constipation   . Depression   . Dysmetabolic syndrome   . Fibromyalgia   . Hairiness   . Hydradenitis   . Hypertension   . Incarcerated ventral hernia 09/29/2011  . 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 Marcellus ScottErin Jackson  . Myopia 08/21/2017   Dr. Marcellus ScottErin Jackson - [facility]  . Obesity   . Obstructive sleep apnea    no CPAP  . Open-angle glaucoma of both eyes, mild stage 08/21/2017   Dr. Marcellus ScottErin Jackson - [facility]  . Plantar fasciitis   . Pre-diabetes   . Presbyopia 07/02/219   Dr. Marcellus ScottErin Jackson  . PVC (premature ventricular contraction)     Past Surgical History:  Procedure Laterality Date  . ACHILLES TENDON SURGERY Left 08/15/2019   Procedure: TRIPLE ARTHRODESIS AND ACHILLES TENDON LENGTHENING LEFT FOOT;  Surgeon: Gwyneth RevelsFowler, Justin, DPM;  Location: [facility] ORS;  Service: Podiatry;  Laterality: Left;    . FOOT ARTHRODESIS Right 12/01/2016   Procedure: ARTHRODESIS FOOT; TRIPLE;  Surgeon: Gwyneth RevelsFowler, Justin, DPM;  Location: [facility] ORS;  Service: Podiatry;  Laterality: Right;  . HERNIA REPAIR  10/09/11   [facility] Incarcerated- [name]  . OOPHORECTOMY Left 12/09/2009  . TUBAL LIGATION  1993  . VENTRAL HERNIA REPAIR  10/09/2011    There were no vitals filed for this visit.   Subjective Assessment - 12/10/19 1046    Subjective Patient reports she is feeling okay today. has 3/10 pain in her left ankle. It hurts when climbing stairs. She has not had any bad pain. Came in with no AD and wearing tennis shoes. States she can walk short distances without her crutches now and HEP is going well except standing exercises are uncomfortable on her back so she takes tylenol arthritis and ibuprofen for that.    Pertinent History Patient is a 49 y.o. female who presents to outpatient physical therapy with a referral for medical diagnosis equinus deformity L foot. This patient's chief complaints consist of pain, stiffness, loss of function and weightbearing tolerance second to achilles lengthening and triple arthrodesis surgery to the L ankle on 08/15/2019, leading to the following functional deficits: Limited ambulatory distance, slower with ADLs and IADLs, not able to  work, cannot do much housework. Relevant past medical history and comorbidities include hypertension, fibromyalgia (controlled with medication), hx of L knee problems (baker's cyst), chronic back pain (never down legs), obesity, hx of R ankle arthrodesis, myopia, depression (recently bad related to surgical situation, feels she has enough support), glaucoma of both eyes, arthritis, cardiac murmur, dysmetabolic syndrome. Patient denies hx of cancer, stroke, seizures, lung problem, major cardiac events, diabetes, unexplained weight loss, changes in bowel or bladder problems, new onset stumbling or dropping things.    Limitations House hold  activities;Standing;Walking;Lifting;Other (comment)   Limited ambulatory distance, slower with ADLs and IADLs, not able to work, cannot do much housework   How long can you sit comfortably? no limited by this problem    How long can you stand comfortably? 5-10 min (brush teeth, comb hair)    How long can you walk comfortably? 2 min    Diagnostic tests Radiograph 10/30/2019: "AP oblique and lateral of the left foot reveals surgical arthrodesis of   the talonavicular, calcaneocuboid, and subtalar joint.  Solid arthrodesis   noted in all 3 views.  No signs of motion to the hardware.  Good bone   consolidation across the fusion sites are noted.  Remainder the foot x-ray   is negative for acute fracture"    Patient Stated Goals return to work, return to walking on her own without problems, be able to complete her usual activities as she did prior to surgery    Currently in Pain? Yes    Pain Score 3     Pain Location Ankle    Pain Orientation Left           TREATMENT:  Therapeutic exercise:to centralize symptoms and improve ROM, strength, muscular endurance, and activity tolerance required for successful completion of functional activities. -sit <> stand from green chair (18 inches) with hands crossed over chest. 3x10.  - lateral step up at 8 inch step with BUE support on railing, 3x10 each side.  - SLS balance with half rounds of contralateral LE with unilateral UE support (light as possible). 3x10 each side.  - reverse mini lunge, 2x10 each side, touched unilateral UE support as needed. (did not complete last set due to back pain).  - seated plantar flexion B LE on leg press machine, 3x10 at 15# - Education on HEP including handout (updated mini lunge to reverse mini lunge to accommodate back pain)  HOME EXERCISE PROGRAM Access Code: [email or web address] URL: [email or web address] Date: 12/10/2019 Prepared by: Norton Blizzard  Exercises Standing Single Leg Stance with Counter Support - 1  x daily - 3 sets - 30 seconds hold Seated Ankle Plantar Flexion with Resistance Loop - 2 x daily - 2 sets - 20 reps Standing Soleus Stretch - 2 x daily - 3 sets - 30 seconds hold Standing Gastroc Stretch - 2 x daily - 3 sets - 30 hold Standing hip abduction with band around ankles - 1 x daily - 3-4 x weekly - 3 sets - 10 reps Runner's Step Up/Down - 3-4 x weekly - 3 sets - 10 reps Reverse Lunge - 3-4 x weekly - 2 sets - 10 reps Tandem Walking with Counter Support - 3-4 x weekly - 2 sets - 5 reps Backward Tandem Walking with Counter Support - 3-4 x weekly - 2 sets - 5 reps    PT Education - 12/10/19 1224    Education Details Exercise purpose/form. Self management techniques. HEP    Person(s) Educated Patient    Methods  Explanation;Demonstration;Tactile cues;Verbal cues;Handout    Comprehension Verbalized understanding;Returned demonstration;Verbal cues required;Tactile cues required;Need further instruction            PT Short Term Goals - 11/20/19 1258      PT SHORT TERM GOAL #1   Title Be independent with initial home exercise program for self-management of symptoms.    Baseline initial HEP provided at IE (11/04/2019);    Time 2    Status Achieved    Target Date 11/18/19             PT Long Term Goals - 11/04/19 1739      PT LONG TERM GOAL #1   Title Demonstrate improved FOTO score to equal or greater than 53 by visit #16 to demonstrate improvement in overall condition and self-reported functional ability.    Baseline 32 (11/04/2019);    Time 12    Period Weeks    Status New   TARGET DATE FOR ALL LONG TERM GOALS: 01/27/2020     PT LONG TERM GOAL #2   Title Pt will ambulate equal or greater than 10000 feet on the 6 Minute Walk Test with minimal>no gait abnormalities to return to walking for exercise and work.    Baseline ambulates 100 feet to car with CAM boot and step too pattern PWB on B axial crutches (11/04/2019);    Time 12    Period Weeks    Status New      PT  LONG TERM GOAL #3   Title Pt will be able to stand for at least 30 minutes without pain for return to work    Baseline 5-10 min before needing to sit down due to pain (11/04/2019);    Time 12    Period Weeks    Status New      PT LONG TERM GOAL #4   Title Pt's L ankle AROM will improve to [facility] for improved functional use of LLE during ambulation and squatting.    Baseline from neutral DF: 0d PF: 35d    Time 12    Period Weeks    Status New      PT LONG TERM GOAL #5   Title Be independent with a long-term home exercise program for self-management of symptoms.    Baseline initial HEP provided at IE (11/04/2019);    Time 12    Period Weeks    Status New                 Plan - 12/10/19 1223    Clinical Impression Statement Patient arrived 14 min late and appointment was unable to be extended to accommodate lost time due to scheduling limitations. Patient tolerated session with some difficulty due to low back pain that she reports is common for her with standing exercises and she did not want to stop performing exercises on account of her back. Did modify lunges and did last exercise in sitting due to increasing back pain. Patient was able to advance LE exercises slightly with mild ankle discomfort and demonstrates improved ambulation with no AD. Patient has not yet returned to [facility] and continues to have functional limitations such as walking longer distances and prolonged standing. Clarified work expectations today and she states her job usually requires sitting with intermittent standing for night shifts. Patient appears to be doing well with 1x a week PT treatment sessions at the clinic. Patient would benefit from continued management of limiting condition by skilled physical therapist to address remaining impairments and  functional limitations to work towards stated goals and return to PLOF or maximal functional independence.    Personal Factors and Comorbidities Age;Comorbidity  3+;Past/Current Experience;Fitness;Time since onset of injury/illness/exacerbation;Profession    Comorbidities Relevant past medical history and comorbidities include hypertension, fibromyalgia (controlled with medication), hx of L knee problems (baker's cyst), chronic back pain (never down legs), obesity, hx of R ankle arthrodesis, myopia, depression (recently bad related to surgical situation, feels she has enough support), glaucoma of both eyes, arthritis, cardiac murmur, dysmetabolic syndrome    Examination-Activity Limitations Squat;Stairs;Locomotion Level;Stand;Bend;Caring for Others;Carry;Transfers;Dressing   Limited ambulatory distance, slower with ADLs and IADLs, not able to work, cannot do much housework   Examination-Participation Restrictions Church;Laundry;Cleaning;Shop;Community Activity;Meal Prep;Driving;Occupation;Yard Work    Stability/Clinical Decision Making Stable/Uncomplicated    Rehab Potential Good    PT Frequency 2x / week    PT Duration 12 weeks    PT Treatment/Interventions ADLs/Self Care Home Management;Aquatic Therapy;Biofeedback;Cryotherapy;Electrical Stimulation;Moist Heat;DME Instruction;Gait Network engineer;Therapeutic activities;Therapeutic exercise;Balance training;Neuromuscular re-education;Patient/family education;Orthotic Fit/Training;Manual techniques;Compression bandaging;Scar mobilization;Passive range of motion;Dry needling;Energy conservation;Taping;Spinal Manipulations;Joint Manipulations    PT Next Visit Plan progressive strengthening, weight bearing, functional activities    PT Home Exercise Plan Medbridge: Access Code: [email or web address]    Consulted and Agree with Plan of Care Patient           Patient will benefit from skilled therapeutic intervention in order to improve the following deficits and impairments:  Abnormal gait, Decreased activity tolerance, Decreased balance, Decreased endurance, Decreased knowledge of precautions, Decreased knowledge of  use of DME, Decreased mobility, Decreased range of motion, Decreased scar mobility, Decreased strength, Difficulty walking, Hypomobility, Increased edema, Impaired perceived functional ability, Impaired flexibility, Improper body mechanics, Postural dysfunction, Obesity, Pain, Decreased skin integrity  Visit Diagnosis: Pain in left ankle and joints of left foot  Muscle weakness (generalized)  Difficulty in walking, not elsewhere classified     Problem List Patient Active Problem List   Diagnosis Date Noted  . Morbid obesity (HCC) 04/17/2018  . 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 Flat foot 01/28/2016  . Aortic sclerosis 10/14/2015  . Midline low back pain without sciatica 09/20/2015  . Leukocytosis 11/29/2014  . Osteoarthritis of left knee 11/20/2014  . History of ventral hernia repair 10/27/2014  . Allergic rhinitis 08/17/2014  . Axillary hidradenitis suppurativa 08/17/2014  . Baker cyst 08/17/2014  . Chronic constipation 08/17/2014  . 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 Engages in travel abroad 08/17/2014  . Fibromyalgia 08/17/2014  . Cardiac murmur 08/17/2014  . Dysmetabolic syndrome 08/17/2014  . Plantar fasciitis 08/17/2014  . Beat, premature ventricular 08/17/2014  . 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 Obstructive apnea 12/02/2013  . Essential (primary) hypertension 12/09/2009  . Hypertrichosis 12/09/2009    Luretha Murphy. Ilsa Iha, PT, DPT 12/10/19, 12:26 PM  [facility] PHYSICAL AND SPORTS MEDICINE [address], [place], [address] Phone: [phone]   Fax:  [phone]  Name: Kathleen Conley MRN: [id number] Date of Birth: [birth year]

## 2019-12-16 ENCOUNTER — Ambulatory Visit: Payer: 59 | Admitting: Physical Therapy

## 2019-12-17 ENCOUNTER — Encounter: Payer: Self-pay | Admitting: Physical Therapy

## 2019-12-17 NOTE — Therapy (Signed)
[facility] PHYSICAL AND SPORTS MEDICINE [address] [place], [address] Phone: [phone]   Fax:  [phone]  Physical Therapy No-Visit Discharge Summary Reporting period: 11/04/2019 - 12/17/2019  Patient Details  Name: Madalynn Pickelsimer MRN: [id number] Date of Birth: [birth year] Referring Provider (PT):  Samara Deist, DPM   Encounter Date: 12/17/2019    Past Medical History:  Diagnosis Date   Abnormal neurological finding suggestive of lumbar-level spinal disorder    Allergy    Anemia    Iron Deficiency   Arthritis    Astigmatism 08/21/2017   Dr. Delila Spence cyst    Breast mass, right 01/18/2010   Cardiac murmur    Chronic constipation    Depression    Dysmetabolic syndrome    Fibromyalgia    Hairiness    Hydradenitis    Hypertension    Incarcerated ventral hernia 09/29/2011   Low-tension glaucoma of both eyes, mild stage 08/21/2017   Dr. Eula Flax   Myopia 08/21/2017   Dr. Eula Flax - [facility]   Obesity    Obstructive sleep apnea    no CPAP   Open-angle glaucoma of both eyes, mild stage 08/21/2017   Dr. Eula Flax - [facility]   Plantar fasciitis    Pre-diabetes    Presbyopia 07/02/219   Dr. Eula Flax   PVC (premature ventricular contraction)     Past Surgical History:  Procedure Laterality Date   ACHILLES TENDON SURGERY Left 08/15/2019   Procedure: TRIPLE ARTHRODESIS AND ACHILLES TENDON LENGTHENING LEFT FOOT;  Surgeon: Samara Deist, DPM;  Location: [facility] ORS;  Service: Podiatry;  Laterality: Left;   FOOT ARTHRODESIS Right 12/01/2016   Procedure: ARTHRODESIS FOOT; TRIPLE;  Surgeon: Samara Deist, DPM;  Location: [facility] ORS;  Service: Podiatry;  Laterality: Right;   HERNIA REPAIR  10/09/11   Ventral Incarcerated- Dr. Pat Patrick   OOPHORECTOMY Left 12/09/2009   [place] REPAIR  10/09/2011    There were no vitals filed for this  visit.   Subjective Assessment - 12/17/19 2009    Subjective Patient called the PT office and requested discharge after seeing her referring physician who released her to start back to work on reduced duty. Reports that imaging showed the surgery had fully healed.    Pertinent History Patient is a 49 y.o. female who presents to outpatient physical therapy with a referral for medical diagnosis equinus deformity L foot. This patient's chief complaints consist of pain, stiffness, loss of function and weightbearing tolerance second to achilles lengthening and triple arthrodesis surgery to the L ankle on 08/15/2019, leading to the following functional deficits: Limited ambulatory distance, slower with ADLs and IADLs, not able to work, cannot do much housework. Relevant past medical history and comorbidities include hypertension, fibromyalgia (controlled with medication), hx of L knee problems (baker's cyst), chronic back pain (never down legs), obesity, hx of R ankle arthrodesis, myopia, depression (recently bad related to surgical situation, feels she has enough support), glaucoma of both eyes, arthritis, cardiac murmur, dysmetabolic syndrome. Patient denies hx of cancer, stroke, seizures, lung problem, major cardiac events, diabetes, unexplained weight loss, changes in bowel or bladder problems, new onset stumbling or dropping things.    Limitations House hold activities;Standing;Walking;Lifting;Other (comment)   Limited ambulatory distance, slower with ADLs and IADLs, not able to work, cannot do much housework   How long can you sit comfortably? no limited by this problem    How long can  you stand comfortably? 5-10 min (brush teeth, comb hair)    How long can you walk comfortably? 2 min    Diagnostic tests Radiograph 10/30/2019: "AP oblique and lateral of the left foot reveals surgical arthrodesis of   the talonavicular, calcaneocuboid, and subtalar joint.  Solid arthrodesis   noted in all 3 views.  No signs of  motion to the hardware.  Good bone   consolidation across the fusion sites are noted.  Remainder the foot x-ray   is negative for acute fracture"    Patient Stated Goals return to work, return to walking on her own without problems, be able to complete her usual activities as she did prior to surgery            OBJECTIVE FOTO = 65 (completed from emailed link) Patient is not present for examination at this time. Please see previous documentation for latest objective data.      PT Short Term Goals - 11/20/19 1258      PT SHORT TERM GOAL #1   Title Be independent with initial home exercise program for self-management of symptoms.    Baseline initial HEP provided at IE (11/04/2019);    Time 2    Status Achieved    Target Date 11/18/19             PT Long Term Goals - 12/17/19 2011      PT LONG TERM GOAL #1   Title Demonstrate improved FOTO score to equal or greater than 53 by visit #16 to demonstrate improvement in overall condition and self-reported functional ability.    Baseline 32 (11/04/2019); 65 (12/17/2019);    Time 12    Period Weeks    Status Achieved   TARGET DATE FOR ALL LONG TERM GOALS: 01/27/2020     PT LONG TERM GOAL #2   Title Pt will ambulate equal or greater than 10000 feet on the 6 Minute Walk Test with minimal>no gait abnormalities to return to walking for exercise and work.    Baseline ambulates 100 feet to car with CAM boot and step too pattern PWB on B axial crutches (11/04/2019);    Time 12    Period Weeks    Status Partially Met      PT LONG TERM GOAL #3   Title Pt will be able to stand for at least 30 minutes without pain for return to work    Baseline 5-10 min before needing to sit down due to pain (11/04/2019);    Time 12    Period Weeks    Status Partially Met      PT LONG TERM GOAL #4   Title Pt's L ankle AROM will improve to [facility] for improved functional use of LLE during ambulation and squatting.    Baseline from neutral DF: 0d PF: 35d    Time  12    Period Weeks    Status Partially Met      PT LONG TERM GOAL #5   Title Be independent with a long-term home exercise program for self-management of symptoms.    Baseline initial HEP provided at IE (11/04/2019);    Time 12    Period Weeks    Status Achieved                 Plan - 12/17/19 2014    Clinical Impression Statement Patient attended 7 physical therapy sessions and made steady progress towards her goals. She is being discharged today due to calling to  request discharge after favorable follow up with her surgeon earlier in the week. Reports she has been released to start back to work with reduced load. Patient has been provided with an appropriate long term HEP and met her FOTO score goal, showing good improvement in self-reported function.    Personal Factors and Comorbidities Age;Comorbidity 3+;Past/Current Experience;Fitness;Time since onset of injury/illness/exacerbation;Profession    Comorbidities Relevant past medical history and comorbidities include hypertension, fibromyalgia (controlled with medication), hx of L knee problems (baker's cyst), chronic back pain (never down legs), obesity, hx of R ankle arthrodesis, myopia, depression (recently bad related to surgical situation, feels she has enough support), glaucoma of both eyes, arthritis, cardiac murmur, dysmetabolic syndrome    Examination-Activity Limitations Squat;Stairs;Locomotion Level;Stand;Bend;Caring for Others;Carry;Transfers;Dressing   Limited ambulatory distance, slower with ADLs and IADLs, not able to work, cannot do much housework   Examination-Participation Restrictions Church;Laundry;Cleaning;Shop;Community Activity;Meal Prep;Driving;Occupation;Yard Work    Stability/Clinical Decision Making Stable/Uncomplicated    Rehab Potential Good    PT Frequency 2x / week    PT Duration 12 weeks    PT Treatment/Interventions ADLs/Self Care Home Management;Aquatic Therapy;Biofeedback;Cryotherapy;Electrical  Stimulation;Moist Heat;DME Instruction;Gait Scientist, forensic;Therapeutic activities;Therapeutic exercise;Balance training;Neuromuscular re-education;Patient/family education;Orthotic Fit/Training;Manual techniques;Compression bandaging;Scar mobilization;Passive range of motion;Dry needling;Energy conservation;Taping;Spinal Manipulations;Joint Manipulations    PT Next Visit Plan Patient is now discharged due to to satisfactory improvement in condition    PT Home Exercise Plan Medbridge: Access Code: [email or web address]    Consulted and Agree with Plan of Care Patient           Patient will benefit from skilled therapeutic intervention in order to improve the following deficits and impairments:  Abnormal gait, Decreased activity tolerance, Decreased balance, Decreased endurance, Decreased knowledge of precautions, Decreased knowledge of use of DME, Decreased mobility, Decreased range of motion, Decreased scar mobility, Decreased strength, Difficulty walking, Hypomobility, Increased edema, Impaired perceived functional ability, Impaired flexibility, Improper body mechanics, Postural dysfunction, Obesity, Pain, Decreased skin integrity  Visit Diagnosis: No diagnosis found.     Problem List Patient Active Problem List   Diagnosis Date Noted   Morbid obesity ([place]) 04/17/2018   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   Flat foot 01/28/2016   Aortic sclerosis 10/14/2015   Midline low back pain without sciatica 09/20/2015   Leukocytosis 11/29/2014   Osteoarthritis of left knee 11/20/2014   History of ventral hernia repair 10/27/2014   Allergic rhinitis 08/17/2014   Axillary hidradenitis suppurativa 08/17/2014   Baker cyst 08/17/2014   Chronic constipation 08/17/2014   Depression, major, recurrent, mild ([place]) 08/17/2014   Engages in travel abroad 08/17/2014   Fibromyalgia 08/17/2014   Cardiac murmur 51/76/1607   Dysmetabolic syndrome 37/11/6267   Plantar fasciitis  08/17/2014   Beat, premature ventricular 08/17/2014   Abnormal neurological finding suggestive of lumbar-level spinal disorder 08/17/2014   Obstructive apnea 12/02/2013   Essential (primary) hypertension 12/09/2009   Hypertrichosis 12/09/2009    Everlean Alstrom. Graylon Good, PT, DPT 12/17/19, 8:15 PM  [facility] PHYSICAL AND SPORTS MEDICINE [address] [place], [address] Phone: [phone]   Fax:  [phone]  Name: Jenne Sellinger MRN: [id number] Date of Birth: Nov 11, 1970

## 2019-12-18 ENCOUNTER — Telehealth: Payer: Self-pay | Admitting: Family Medicine

## 2019-12-18 ENCOUNTER — Telehealth: Payer: Self-pay

## 2019-12-18 DIAGNOSIS — F330 Major depressive disorder, recurrent, mild: Secondary | ICD-10-CM

## 2019-12-18 NOTE — Telephone Encounter (Signed)
Medication Refill - Medication: Wellbutrin  Has the patient contacted their pharmacy? Yes.   (Agent: If no, request that the patient contact the pharmacy for the refill.) (Agent: If yes, when and what did the pharmacy advise?)  Preferred Pharmacy (with phone number or street name):[facility] - , [place] - [address]   Agent: Please be advised that RX refills may take up to 3 business days. We ask that you follow-up with your pharmacy.

## 2019-12-19 MED ORDER — BUPROPION HCL ER (XL) 150 MG PO TB24: 150.0000 mg | ORAL_TABLET | Freq: Every evening | ORAL | 0 refills | Status: DC

## 2019-12-22 ENCOUNTER — Encounter: Payer: Self-pay | Admitting: Physical Therapy

## 2019-12-22 NOTE — Telephone Encounter (Signed)
lvm for pt to call and schedule an appt °

## 2019-12-24 ENCOUNTER — Encounter: Payer: Self-pay | Admitting: Physical Therapy

## 2019-12-29 ENCOUNTER — Encounter: Payer: Self-pay | Admitting: Physical Therapy

## 2020-01-01 ENCOUNTER — Encounter: Payer: Self-pay | Admitting: Physical Therapy

## 2020-01-05 ENCOUNTER — Encounter: Payer: Self-pay | Admitting: Physical Therapy

## 2020-01-08 ENCOUNTER — Encounter: Payer: Self-pay | Admitting: Physical Therapy

## 2020-01-12 ENCOUNTER — Encounter: Payer: Self-pay | Admitting: Physical Therapy

## 2020-01-14 ENCOUNTER — Encounter: Payer: Self-pay | Admitting: Physical Therapy

## 2020-01-19 ENCOUNTER — Encounter: Payer: Self-pay | Admitting: Physical Therapy

## 2020-01-21 ENCOUNTER — Encounter: Payer: Self-pay | Admitting: Physical Therapy

## 2020-01-22 ENCOUNTER — Encounter: Payer: Self-pay | Admitting: Physical Therapy

## 2020-01-26 ENCOUNTER — Encounter: Payer: Self-pay | Admitting: Physical Therapy

## 2020-01-29 ENCOUNTER — Encounter: Payer: Self-pay | Admitting: Physical Therapy

## 2020-02-02 ENCOUNTER — Encounter: Payer: Self-pay | Admitting: Physical Therapy

## 2020-02-05 ENCOUNTER — Encounter: Payer: Self-pay | Admitting: Physical Therapy

## 2020-03-11 NOTE — Progress Notes (Signed)
Name: Kathleen Conley   MRN: [id number]    DOB: 1970-12-10   Date:03/15/2020       Progress Note  Subjective  Chief Complaint  Chief Complaint  Patient presents with  . Cough    X1 week  . Headache  . Sore Throat    I connected with  Elvera Maria on 03/15/20 at  9:40 AM EST by telephone and verified that I am speaking with the correct person using two identifiers.  I discussed the limitations, risks, security and privacy concerns of performing an evaluation and management service by telephone and the availability of in person appointments. Staff also discussed with the patient that there may be a patient responsible charge related to this service. 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 She woke up with headache, nasal congestion, sore throat, fatigue, cough and wheezing. She has noticed decrease in appetite, but normal sense of taste and smell. No fever. She got 3 Moderma vaccines. She has a pulse ox at home and has been above 94 %. She has risk factors such as Morbid obesity and HTN  Major Depression: she is taking Duloxetine and needs a refill, she had another foot surgery, but went back to work January 2022. Phq9 negative.   FMS: she needs refills of skelaxin for muscle spasms, we will give her 30 days until she can come in person for follow up. FMS pain can go up to 7/10   Patient Active Problem List   Diagnosis Date Noted  . Knee pain 03/15/2020  . Morbid obesity (HCC) 04/17/2018  . 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 Flat foot 01/28/2016  . Aortic sclerosis 10/14/2015  . Midline low back pain without sciatica 09/20/2015  . Leukocytosis 11/29/2014  . Osteoarthritis of left knee 11/20/2014  . History of ventral  hernia repair 10/27/2014  . Allergic rhinitis 08/17/2014  . Axillary hidradenitis suppurativa 08/17/2014  . Baker cyst 08/17/2014  . Chronic constipation 08/17/2014  . 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 Engages in travel abroad 08/17/2014  . Fibromyalgia 08/17/2014  . Cardiac murmur 08/17/2014  . Dysmetabolic syndrome 08/17/2014  . Plantar fasciitis 08/17/2014  . Beat, premature ventricular 08/17/2014  . 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 Obstructive apnea 12/02/2013  . Essential (primary) hypertension 12/09/2009  . Hypertrichosis 12/09/2009    Past Surgical History:  Procedure Laterality Date  . ACHILLES TENDON SURGERY Left 08/15/2019   Procedure: TRIPLE ARTHRODESIS AND ACHILLES TENDON LENGTHENING LEFT FOOT;  Surgeon: [name], DPM;  Location: [facility];  Service: Podiatry;  Laterality: Left;  . FOOT ARTHRODESIS Right 12/01/2016   Procedure: ARTHRODESIS FOOT; TRIPLE;  Surgeon: [name], DPM;  Location: [facility];  Service: Podiatry;  Laterality: Right;  . HERNIA REPAIR  10/09/11   Ventral Incarcerated- Dr. [name]  . OOPHORECTOMY Left 12/09/2009  . TUBAL LIGATION  1993  . VENTRAL HERNIA REPAIR  10/09/2011    Family History  Problem Relation Age of Onset  . Hypertension Mother   . CVA Mother   . Kidney disease Mother   . Congenital heart disease Mother   . Heart disease Father   . Hypertension Father   . Diabetes Father   . Breast cancer Paternal  Grandmother        Bilateral  . Colon cancer Maternal Grandfather   . Colon cancer Maternal Uncle      Current Outpatient Medications:  .  acetaminophen (TYLENOL) 500 MG tablet, Take 1 tablet (500 mg total) by mouth every 6 (six) hours as needed. Max of 3 grams daily or 6 pills dialy (Patient taking differently: Take 1,000 mg by mouth every 6 (six) hours as needed for moderate pain or headache.), Disp: 30 tablet, Rfl: 0 .  Armodafinil (NUVIGIL) 150 MG  tablet, Take 1 tablet (150 mg total) by mouth daily. (Patient taking differently: Take 150 mg by mouth daily as needed (shift work--sleepiness).), Disp: 90 tablet, Rfl: 0 .  aspirin EC 81 MG tablet, Take 1 tablet (81 mg total) by mouth daily. (Patient taking differently: Take 81 mg by mouth every evening.), Disp: 30 tablet, Rfl: 0 .  buPROPion (WELLBUTRIN XL) 150 MG 24 hr tablet, Take 1 tablet (150 mg total) by mouth every evening., Disp: 90 tablet, Rfl: 0 .  cetirizine (ZYRTEC) 10 MG tablet, Take 10 mg by mouth every evening., Disp: , Rfl:  .  Cholecalciferol (VITAMIN D) 2000 UNITS tablet, Take 2,000 Units by mouth every evening. , Disp: , Rfl:  .  clotrimazole-betamethasone (LOTRISONE) cream, Apply 1 application topically 2 (two) times daily. (Patient taking differently: Apply 1 application topically 2 (two) times daily as needed (skin irritation.).), Disp: 90 g, Rfl: 0 .  docusate sodium (COLACE) 100 MG capsule, Take 100 mg by mouth daily as needed for moderate constipation. , Disp: , Rfl:  .  ferrous sulfate 325 (65 FE) MG tablet, Take 325 mg by mouth every evening. , Disp: , Rfl:  .  fluticasone (FLONASE) 50 MCG/ACT nasal spray, Place 2 sprays into both nostrils daily as needed for allergies., Disp: 16 g, Rfl: 1 .  Fluticasone-Umeclidin-Vilant (TRELEGY ELLIPTA) 100-62.5-25 MCG/INH AEPB, Inhale 1 puff into the lungs daily., Disp: 60 each, Rfl: 0 .  ibuprofen (ADVIL) 200 MG tablet, Take 200-600 mg by mouth every 6 (six) hours as needed (for pain.)., Disp: , Rfl:  .  levonorgestrel (MIRENA) 20 MCG/24HR IUD, 1 each by Intrauterine route once. , Disp: , Rfl:  .  Multiple Vitamin (MULTIVITAMIN WITH MINERALS) TABS tablet, Take 1 tablet by mouth every evening., Disp: , Rfl:  .  nystatin (MYCOSTATIN/NYSTOP) powder, Apply 1 application topically 2 (two) times daily as needed., Disp: 60 g, Rfl: 0 .  olmesartan (BENICAR) 40 MG tablet, Take 1 tablet (40 mg total) by mouth daily. (Patient taking differently:  Take 40 mg by mouth every evening.), Disp: 90 tablet, Rfl: 1 .  TRAVATAN Z 0.004 % SOLN ophthalmic solution, Place 1 drop into both eyes at bedtime., Disp: , Rfl: 3 .  DULoxetine (CYMBALTA) 60 MG capsule, Take 1 capsule (60 mg total) by mouth every evening., Disp: 30 capsule, Rfl: 0 .  metaxalone (SKELAXIN) 800 MG tablet, Take 1 tablet (800 mg total) by mouth 3 (three) times daily as needed for muscle spasms., Disp: 30 tablet, Rfl: 0  Allergies  Allergen Reactions  . Lisinopril Cough       . Norvasc [Amlodipine Besylate]     I personally reviewed active problem list, medication list, allergies, family history, social history, health maintenance with the patient/caregiver today.   ROS  Ten systems reviewed and is negative except as mentioned in HPI   Objective  Virtual encounter, vitals not obtained.  There is no height or weight on file to calculate BMI.  Physical Exam  Awake, alert and oriented  PHQ2/9: Depression screen [facility] 2/9 03/15/2020 04/15/2019 10/18/2018 08/07/2018 07/17/2018  Decreased Interest 0 1 2 1  0  Down, Depressed, Hopeless 0 0 2 1 0  PHQ - 2 Score 0 1 4 2  0  Altered sleeping - 0 1 1 0  Tired, decreased energy - 1 2 1  0  Change in appetite - 1 2 1  0  Feeling bad or failure about yourself  - 0 2 1 0  Trouble concentrating - 0 2 1 0  Moving slowly or fidgety/restless - 0 0 0 0  Suicidal thoughts - 0 0 0 0  PHQ-9 Score - 3 13 7  0  Difficult doing work/chores - Not difficult at all Somewhat difficult Somewhat difficult Not difficult at all  Some recent data might be hidden   PHQ-2/9 Result is positive.    Fall Risk: Fall Risk  03/15/2020 04/15/2019 10/18/2018 07/17/2018 06/07/2018  Falls in the past year? 0 0 0 0 0  Number falls in past yr: 0 0 0 0 0  Injury with Fall? 0 0 0 0 0     Assessment & Plan  1. Suspected COVID-19 virus infection  - Novel Coronavirus, NAA ([facility])  2. Wheezing  - Fluticasone-Umeclidin-Vilant (TRELEGY ELLIPTA) 100-62.5-25  MCG/INH AEPB; Inhale 1 puff into the lungs daily.  Dispense: 60 each; Refill: 0 - Novel Coronavirus, NAA ([facility])  3. Fibromyalgia  - DULoxetine (CYMBALTA) 60 MG capsule; Take 1 capsule (60 mg total) by mouth every evening.  Dispense: 30 capsule; Refill: 0 - metaxalone (SKELAXIN) 800 MG tablet; Take 1 tablet (800 mg total) by mouth 3 (three) times daily as needed for muscle spasms.  Dispense: 30 tablet; Refill: 0  4. Depression, major, recurrent, mild (HCC)  - DULoxetine (CYMBALTA) 60 MG capsule; Take 1 capsule (60 mg total) by mouth every evening.  Dispense: 30 capsule; Refill: 0 I discussed the assessment and treatment plan with the patient. The patient was provided an opportunity to ask questions and all were answered. The patient agreed with the plan and demonstrated an understanding of the instructions.   The patient was advised to call back or seek an in-person evaluation if the symptoms worsen or if the condition fails to improve as anticipated.  I provided 25  minutes of non-face-to-face time during this encounter.  04/17/2019, MD

## 2020-03-15 ENCOUNTER — Other Ambulatory Visit: Payer: Self-pay

## 2020-03-15 ENCOUNTER — Ambulatory Visit (INDEPENDENT_AMBULATORY_CARE_PROVIDER_SITE_OTHER): Payer: 59 | Admitting: Family Medicine

## 2020-03-15 ENCOUNTER — Encounter: Payer: Self-pay | Admitting: Family Medicine

## 2020-03-15 DIAGNOSIS — F330 Major depressive disorder, recurrent, mild: Secondary | ICD-10-CM

## 2020-03-15 DIAGNOSIS — Z20822 Contact with and (suspected) exposure to covid-19: Secondary | ICD-10-CM

## 2020-03-15 DIAGNOSIS — R062 Wheezing: Secondary | ICD-10-CM

## 2020-03-15 DIAGNOSIS — M25569 Pain in unspecified knee: Secondary | ICD-10-CM | POA: Insufficient documentation

## 2020-03-15 DIAGNOSIS — M797 Fibromyalgia: Secondary | ICD-10-CM

## 2020-03-15 MED ORDER — METAXALONE 800 MG PO TABS: 800.0000 mg | ORAL_TABLET | Freq: Three times a day (TID) | ORAL | 0 refills | Status: DC | PRN

## 2020-03-15 MED ORDER — DULOXETINE HCL 60 MG PO CPEP: 60.0000 mg | ORAL_CAPSULE | Freq: Every evening | ORAL | 0 refills | Status: DC

## 2020-03-15 MED ORDER — TRELEGY ELLIPTA 100-62.5-25 MCG/INH IN AEPB: 1.0000 | INHALATION_SPRAY | Freq: Every day | RESPIRATORY_TRACT | 0 refills | Status: DC

## 2020-03-17 LAB — SARS-COV-2, NAA 2 DAY TAT

## 2020-03-17 LAB — NOVEL CORONAVIRUS, NAA: SARS-CoV-2, NAA: NOT DETECTED

## 2020-03-17 LAB — SPECIMEN STATUS REPORT

## 2020-03-25 ENCOUNTER — Other Ambulatory Visit: Payer: Self-pay | Admitting: Family Medicine

## 2020-03-25 DIAGNOSIS — I10 Essential (primary) hypertension: Secondary | ICD-10-CM

## 2020-03-25 MED ORDER — OLMESARTAN MEDOXOMIL 40 MG PO TABS: 40.0000 mg | ORAL_TABLET | Freq: Every day | ORAL | 0 refills | Status: DC

## 2020-03-25 NOTE — Telephone Encounter (Signed)
Medication: olmesartan (BENICAR) 40 MG tablet [[id number]] Apt is 04/01/20  Patient states that she is down to 2 pills  Has the patient contacted their pharmacy? YES  (Agent: If no, request that the patient contact the pharmacy for the refill.) (Agent: If yes, when and what did the pharmacy advise?)  Preferred Pharmacy (with phone number or street name): [facility] [address] - [place], [place] - [address] [facility] [address] [place] [address] Phone: [phone] Fax: [phone] Hours: Not open 24 hours    Agent: Please be advised that RX refills may take up to 3 business days. We ask that you follow-up with your pharmacy.

## 2020-03-31 NOTE — Progress Notes (Deleted)
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 She woke up with headache, nasal congestion, sore throat, fatigue, cough and wheezing. She has noticed decrease in appetite, but normal sense of taste and smell. No fever. She got 3 Moderma vaccines. She has a pulse ox at home and has been above 94 %. She has risk factors such as Morbid obesity and HTN  Major Depression: she is taking Duloxetine and needs a refill, she had another foot surgery, but went back to work January 2022. Phq9 negative.   FMS: she needs refills of skelaxin for muscle spasms, we will give her 30 days until she can come in person for follow up. FMS pain can go up to 7/10   Patient Active Problem List   Diagnosis Date Noted  . Knee pain 03/15/2020  . Morbid obesity (HCC) 04/17/2018  . 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 Flat foot 01/28/2016  . Aortic sclerosis 10/14/2015  . Midline low back pain without sciatica 09/20/2015  . Leukocytosis 11/29/2014  . Osteoarthritis of left knee 11/20/2014  . History of ventral hernia repair 10/27/2014  . Allergic rhinitis 08/17/2014  . Axillary hidradenitis suppurativa 08/17/2014  . Baker cyst 08/17/2014  . Chronic constipation 08/17/2014  . 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 Engages in travel abroad 08/17/2014  . Fibromyalgia 08/17/2014  . Cardiac murmur 08/17/2014  . Dysmetabolic syndrome 08/17/2014  . Plantar fasciitis 08/17/2014  . Beat, premature ventricular 08/17/2014  . 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 Obstructive apnea 12/02/2013  . Essential (primary) hypertension 12/09/2009  . Hypertrichosis 12/09/2009    Past Surgical History:  Procedure  Laterality Date  . ACHILLES TENDON SURGERY Left 08/15/2019   Procedure: TRIPLE ARTHRODESIS AND ACHILLES TENDON LENGTHENING LEFT FOOT;  Surgeon: [name], DPM;  Location: [facility];  Service: Podiatry;  Laterality: Left;  . FOOT ARTHRODESIS Right 12/01/2016   Procedure: ARTHRODESIS FOOT; TRIPLE;  Surgeon: [name], DPM;  Location: [facility];  Service: Podiatry;  Laterality: Right;  . HERNIA REPAIR  10/09/11   Ventral Incarcerated- Dr. [name]  . OOPHORECTOMY Left 12/09/2009  . TUBAL LIGATION  1993  . VENTRAL HERNIA REPAIR  10/09/2011    Family History  Problem Relation Age of Onset  . Hypertension Mother   . CVA Mother   . Kidney disease Mother   . Congenital heart disease Mother   . Heart disease Father   . Hypertension Father   . Diabetes Father   . Breast cancer Paternal Grandmother        Bilateral  . Colon cancer Maternal Grandfather   . Colon cancer Maternal Uncle     Social History   Tobacco Use  . Smoking status: Never Smoker  . Smokeless tobacco: Never Used  Substance Use Topics  . Alcohol use: No    Alcohol/week: 0.0 standard drinks     Current Outpatient Medications:  .  acetaminophen (TYLENOL) 500 MG tablet, Take 1 tablet (500 mg total) by mouth every 6 (six) hours as needed. Max of 3 grams daily or 6 pills dialy (Patient taking differently: Take 1,000 mg by mouth every 6 (six) hours as needed for moderate pain or  headache.), Disp: 30 tablet, Rfl: 0 .  Armodafinil (NUVIGIL) 150 MG tablet, Take 1 tablet (150 mg total) by mouth daily. (Patient taking differently: Take 150 mg by mouth daily as needed (shift work--sleepiness).), Disp: 90 tablet, Rfl: 0 .  aspirin EC 81 MG tablet, Take 1 tablet (81 mg total) by mouth daily. (Patient taking differently: Take 81 mg by mouth every evening.), Disp: 30 tablet, Rfl: 0 .  buPROPion (WELLBUTRIN XL) 150 MG 24 hr tablet, Take 1 tablet (150 mg total) by mouth every evening., Disp: 90 tablet, Rfl: 0 .  cetirizine (ZYRTEC) 10  MG tablet, Take 10 mg by mouth every evening., Disp: , Rfl:  .  Cholecalciferol (VITAMIN D) 2000 UNITS tablet, Take 2,000 Units by mouth every evening. , Disp: , Rfl:  .  clotrimazole-betamethasone (LOTRISONE) cream, Apply 1 application topically 2 (two) times daily. (Patient taking differently: Apply 1 application topically 2 (two) times daily as needed (skin irritation.).), Disp: 90 g, Rfl: 0 .  docusate sodium (COLACE) 100 MG capsule, Take 100 mg by mouth daily as needed for moderate constipation. , Disp: , Rfl:  .  DULoxetine (CYMBALTA) 60 MG capsule, Take 1 capsule (60 mg total) by mouth every evening., Disp: 30 capsule, Rfl: 0 .  ferrous sulfate 325 (65 FE) MG tablet, Take 325 mg by mouth every evening. , Disp: , Rfl:  .  fluticasone (FLONASE) 50 MCG/ACT nasal spray, Place 2 sprays into both nostrils daily as needed for allergies., Disp: 16 g, Rfl: 1 .  Fluticasone-Umeclidin-Vilant (TRELEGY ELLIPTA) 100-62.5-25 MCG/INH AEPB, Inhale 1 puff into the lungs daily., Disp: 60 each, Rfl: 0 .  ibuprofen (ADVIL) 200 MG tablet, Take 200-600 mg by mouth every 6 (six) hours as needed (for pain.)., Disp: , Rfl:  .  levonorgestrel (MIRENA) 20 MCG/24HR IUD, 1 each by Intrauterine route once. , Disp: , Rfl:  .  metaxalone (SKELAXIN) 800 MG tablet, Take 1 tablet (800 mg total) by mouth 3 (three) times daily as needed for muscle spasms., Disp: 30 tablet, Rfl: 0 .  Multiple Vitamin (MULTIVITAMIN WITH MINERALS) TABS tablet, Take 1 tablet by mouth every evening., Disp: , Rfl:  .  nystatin (MYCOSTATIN/NYSTOP) powder, Apply 1 application topically 2 (two) times daily as needed., Disp: 60 g, Rfl: 0 .  olmesartan (BENICAR) 40 MG tablet, Take 1 tablet (40 mg total) by mouth daily., Disp: 90 tablet, Rfl: 0 .  TRAVATAN Z 0.004 % SOLN ophthalmic solution, Place 1 drop into both eyes at bedtime., Disp: , Rfl: 3  Allergies  Allergen Reactions  . Lisinopril Cough       . Norvasc [Amlodipine Besylate]     I personally  reviewed {Reviewed:14835} with the patient/caregiver today.   ROS  ***  Objective  There were no vitals filed for this visit.  There is no height or weight on file to calculate BMI.  Physical Exam ***  Recent Results (from the past 2160 hour(s))  Novel Coronavirus, NAA ([facility])     Status: None   Collection Time: 03/15/20 12:00 AM   Specimen: Nasopharyngeal(NP) swabs in vial transport medium   Nasopharynge  Result Value Ref Range   SARS-CoV-2, NAA Not Detected Not Detected    Comment: This nucleic acid amplification test was developed and its performance characteristics determined by [employer]. Nucleic acid amplification tests include RT-PCR and TMA. This test has not been FDA cleared or approved. This test has been authorized by FDA under an Emergency Use Authorization (EUA). This test is only  authorized for the duration of time the declaration that circumstances exist justifying the authorization of the emergency use of in vitro diagnostic tests for detection of SARS-CoV-2 virus and/or diagnosis of COVID-19 infection under section 564(b)(1) of the Act, 21 U.S.C. 169CVE-9(F) (1), unless the authorization is terminated or revoked sooner. When diagnostic testing is negative, the possibility of a false negative result should be considered in the context of a patient's recent exposures and the presence of clinical signs and symptoms consistent with COVID-19. An individual without symptoms of COVID-19 and who is not shedding SARS-CoV-2 virus wo uld expect to have a negative (not detected) result in this assay.   SARS-COV-2, NAA 2 DAY TAT     Status: None   Collection Time: 03/15/20 12:00 AM   Nasopharynge  Result Value Ref Range   SARS-CoV-2, NAA 2 DAY TAT Performed   Specimen status report     Status: None   Collection Time: 03/15/20 12:00 AM  Result Value Ref Range   specimen status report Comment     Comment: Please note Please note The date and/or time  of collection was not indicated on the requisition as required by state and federal law.  The date of receipt of the specimen was used as the collection date if not supplied.     Diabetic Foot Exam: Diabetic Foot Exam - Simple   No data filed    ***  PHQ2/9: Depression screen [facility] 2/9 03/15/2020 04/15/2019 10/18/2018 08/07/2018 07/17/2018  Decreased Interest 0 1 2 1  0  Down, Depressed, Hopeless 0 0 2 1 0  PHQ - 2 Score 0 1 4 2  0  Altered sleeping - 0 1 1 0  Tired, decreased energy - 1 2 1  0  Change in appetite - 1 2 1  0  Feeling bad or failure about yourself  - 0 2 1 0  Trouble concentrating - 0 2 1 0  Moving slowly or fidgety/restless - 0 0 0 0  Suicidal thoughts - 0 0 0 0  PHQ-9 Score - 3 13 7  0  Difficult doing work/chores - Not difficult at all Somewhat difficult Somewhat difficult Not difficult at all  Some recent data might be hidden    phq 9 is {gen pos ***  Fall Risk: Fall Risk  03/15/2020 04/15/2019 10/18/2018 07/17/2018 06/07/2018  Falls in the past year? 0 0 0 0 0  Number falls in past yr: 0 0 0 0 0  Injury with Fall? 0 0 0 0 0   ***   Functional Status Survey:   ***   Assessment & Plan  *** There are no diagnoses linked to this encounter.

## 2020-04-01 ENCOUNTER — Telehealth: Payer: Self-pay | Admitting: Family Medicine

## 2020-04-01 ENCOUNTER — Ambulatory Visit: Payer: 59 | Admitting: Family Medicine

## 2020-04-01 NOTE — Telephone Encounter (Signed)
Pt was seen last week or so and called to schedule another apt / first available was 2.15.21/ Pt states she is still experiencing the coughing and that she wanted to know if Dr. Carlynn Purl can call in some Tussinex or cough syrup/ she states her cough is bad / please advise

## 2020-04-02 ENCOUNTER — Other Ambulatory Visit: Payer: Self-pay | Admitting: Family Medicine

## 2020-04-02 MED ORDER — HYDROCOD POLST-CPM POLST ER 10-8 MG/5ML PO SUER: 5.0000 mL | Freq: Two times a day (BID) | ORAL | 0 refills | Status: DC | PRN

## 2020-05-11 ENCOUNTER — Other Ambulatory Visit: Payer: Self-pay | Admitting: Family Medicine

## 2020-05-11 DIAGNOSIS — M797 Fibromyalgia: Secondary | ICD-10-CM

## 2020-05-11 DIAGNOSIS — I10 Essential (primary) hypertension: Secondary | ICD-10-CM

## 2020-05-11 DIAGNOSIS — R062 Wheezing: Secondary | ICD-10-CM

## 2020-05-11 DIAGNOSIS — F330 Major depressive disorder, recurrent, mild: Secondary | ICD-10-CM

## 2020-05-11 NOTE — Telephone Encounter (Signed)
Requested medication (s) are due for refill today:no  Requested medication (s) are on the active medication list: yes  Future visit scheduled: yes  Notes to clinic:  Patient has upcoming appointment on 05/24/2020 Review medication for refills   Requested Prescriptions  Pending Prescriptions Disp Refills   TRELEGY ELLIPTA 100-62.5-25 MCG/INH AEPB [Pharmacy Med Name: TRELEGY ELLIPTA 100-62.5-25] 60 each 0    Sig: INHALE ONE PUFF BY MOUTH DAILY      Off-Protocol Failed - 05/11/2020 10:24 AM      Failed - Medication not assigned to a protocol, review manually.      Passed - Valid encounter within last 12 months    Recent Outpatient Visits           1 month ago Suspected COVID-19 virus infection   [facility] [place], Danna Hefty, MD   7 months ago Depression, major, recurrent, mild [facility])   [facility], MD   1 year ago Obstructive apnea   [facility], MD   1 year ago Depression, major, recurrent, mild [facility])   [facility], MD   1 year ago Abscess of left axilla   [facility] Al[facility] Cory, MD       Future Appointments             In 1 week [facility] Cory, MD [facility]               buPROPion (WELLBUTRIN XL) 150 MG 24 hr tablet [Pharmacy Med Name: buPROPion HCL XL 150 MG TABLET] 90 tablet 0    Sig: TAKE ONE TABLET BY MOUTH EVERY EVENING      Psychiatry: Antidepressants - bupropion Failed - 05/11/2020 10:24 AM      Failed - Last BP in normal range    BP Readings from Last 1 Encounters:  08/15/19 (!) 149/77          Passed - Completed PHQ-2 or PHQ-9 in the last 360 days      Passed - Valid encounter within last 6 months    Recent Outpatient Visits           1 month ago Suspected COVID-19 virus infection   [facility] [place], Danna Hefty, MD   7 months ago Depression, major,  recurrent, mild [facility])   [facility], MD   1 year ago Obstructive apnea   [facility], MD   1 year ago Depression, major, recurrent, mild [facility])   [facility] Al[facility] Cory, MD   1 year ago Abscess of left axilla   [facility], MD       Future Appointments             In 1 week Alba Cory, MD [facility]               DULoxetine (CYMBALTA) 60 MG capsule [Pharmacy Med Name: DULOXETINE HCL DR 60 MG CAPSULE] 30 capsule 0    Sig: TAKE ONE CAPSULE BY MOUTH EVERY EVENING      Psychiatry: Antidepressants - SNRI Failed - 05/11/2020 10:24 AM      Failed - Last BP in normal range    BP Readings from Last 1 Encounters:  08/15/19 (!) 149/77          Passed - Completed PHQ-2 or PHQ-9 in the last  360 days      Passed - Valid encounter within last 6 months    Recent Outpatient Visits           1 month ago Suspected COVID-19 virus infection   [facility] [place], Danna Hefty, MD   7 months ago Depression, major, recurrent, mild [facility])   [facility], MD   1 year ago Obstructive apnea   [facility] Cory, MD   1 year ago Depression, major, recurrent, mild [facility])   [facility] Cory, MD   1 year ago Abscess of left axilla   [facility] Cory, MD       Future Appointments             In 1 week Al[place] Cory, MD [facility]               metaxalone (SKELAXIN) 800 MG tablet [Pharmacy Med Name: METAXALONE 800 MG TABLET] 30 tablet 0    Sig: TAKE ONE TABLET BY MOUTH THREE TIMES A DAY AS NEEDED FOR MUSCLE SPASMS      Not Delegated - Analgesics:  Muscle Relaxants Failed - 05/11/2020 10:24 AM      Failed - This refill cannot be delegated      Passed - Valid  encounter within last 6 months    Recent Outpatient Visits           1 month ago Suspected COVID-19 virus infection   [facility] [place], Danna Hefty, MD   7 months ago Depression, major, recurrent, mild [facility])   [facility], Danna Hefty, MD   1 year ago Obstructive apnea   [facility] [place], Danna Hefty, MD   1 year ago Depression, major, recurrent, mild [facility])   [facility], MD   1 year ago Abscess of left axilla   [facility] Alba Cory, MD       Future Appointments             In 1 week [facility] Cory, MD [facility], [facility]               olmesartan (BENICAR) 40 MG tablet [Pharmacy Med Name: OLMESARTAN MEDOXOMIL 40 MG TAB] 90 tablet 0    Sig: TAKE ONE TABLET BY MOUTH DAILY      Cardiovascular:  Angiotensin Receptor Blockers Failed - 05/11/2020 10:24 AM      Failed - Cr in normal range and within 180 days    Creat  Date Value Ref Range Status  10/18/2018 0.65 0.50 - 1.10 mg/dL Final   Creatinine, Ser  Date Value Ref Range Status  08/08/2019 0.73 0.44 - 1.00 mg/dL Final   Creatinine, Urine  Date Value Ref Range Status  10/18/2018 155 20 - 275 mg/dL Final          Failed - K in normal range and within 180 days    Potassium  Date Value Ref Range Status  08/08/2019 4.0 3.5 - 5.1 mmol/L Final  11/01/2012 4.0 3.5 - 5.1 mmol/L Final          Failed - Last BP in normal range    BP Readings from Last 1 Encounters:  08/15/19 (!) 149/77          Passed - Patient is not pregnant      Passed - Valid encounter within last 6  months    Recent Outpatient Visits           1 month ago Suspected COVID-19 virus infection   [facility] [place], Danna Hefty, MD   7 months ago Depression, major, recurrent, mild [facility])   [facility] Cory, MD   1 year ago Obstructive apnea   [facility], MD   1 year ago Depression, major, recurrent, mild [facility])   [facility] Al[facility] Cory, MD   1 year ago Abscess of left axilla   [facility] Alba Cory, MD       Future Appointments             In 1 week Alba Cory, MD [facility]

## 2020-05-21 NOTE — Progress Notes (Signed)
Name: Kathleen Conley   MRN: [id number]    DOB: 09/05/1970   Date:05/24/2020       Progress Note  Subjective  Chief Complaint  Follow up   HPI   Major depression:she is doing well, but worried about seasonal affective disorder,she is back onWellbutrinsince Fall 2019,, but today bp is elevated, we will try going up on Duloxetine and cut down wellbutrin, she states since left foot surgery, she has been back to work, also pain resolved and is feeling much better about her life.   Metabolic Syndrome and Obesity: we tried giving her Trulicity but needs step therapy, shedid not likeMetformin - caused diarrhea. She denies polyphagia, polyuria or polydipsia.She is only on life style modification Last A1C was 5.9 %  Obesity: she has a long history of obesity, started after her divorce when she was in her 7130's. She tried weight watchers but was unsuccessful, we discussed intermittent fasting , she states she is a stress eater. Discussed Saxenda - but not covered by insurance, discussed contrave , but we will get bp under control first   FMS: she states pain is better with Cymbalta, could not tolerate Lyrica ( caused nightmares and vivid dreams)also stopped Gabapentin ( she was grinding her teeth), she states pain has been controlled, taking skelaxin prn. She has flares but not as often   OSA: she has mild symptoms,did not qualify for CPAP,avoiding sleeping on her back,trying to sleep  on her side, she is not waking up with headaches. She also has shift work sleep disorder and states nuvigil helps her stay awake, she works third shift   HTN: bp is elevated today, she agreed on changing from Benicar 40 mg to Benicar 40/12.5 mg daily, discussed possible side effects. No chest pain or palpitation, dizziness.  Insomnia: she is doing well at this time, except on weekends, she works M-F 11 pm to 7 am, takes Big Lotsuvigil prior to work . Doing well and needs refills.   Leucocytosis and  anemia: she has seen hematologist in the past and does not want to go back at this time.She has hidradenitis suppurative . we will recheck it now   Patient Active Problem List   Patient Active Problem List   Diagnosis Date Noted  . Knee pain 03/15/2020  . Morbid obesity (HCC) 04/17/2018  . 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 Flat foot 01/28/2016  . Aortic sclerosis 10/14/2015  . Midline low back pain without sciatica 09/20/2015  . Leukocytosis 11/29/2014  . Osteoarthritis of left knee 11/20/2014  . History of ventral hernia repair 10/27/2014  . Allergic rhinitis 08/17/2014  . Axillary hidradenitis suppurativa 08/17/2014  . Baker cyst 08/17/2014  . Chronic constipation 08/17/2014  . 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 Engages in travel abroad 08/17/2014  . Fibromyalgia 08/17/2014  . Cardiac murmur 08/17/2014  . Dysmetabolic syndrome 08/17/2014  . Plantar fasciitis 08/17/2014  . Beat, premature ventricular 08/17/2014  . 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 Obstructive apnea 12/02/2013  . Essential (primary) hypertension 12/09/2009  . Hypertrichosis 12/09/2009    Past Surgical History:  Procedure Laterality Date  . ACHILLES TENDON SURGERY Left 08/15/2019   Procedure: TRIPLE ARTHRODESIS AND ACHILLES TENDON LENGTHENING LEFT FOOT;  Surgeon: Gwyneth RevelsFowler, Justin, DPM;  Location: [facility] ORS;  Service: Podiatry;  Laterality: Left;  . FOOT ARTHRODESIS Right 12/01/2016   Procedure: ARTHRODESIS FOOT; TRIPLE;  Surgeon: Gwyneth RevelsFowler, Justin, DPM;  Location: [facility] ORS;  Service: Podiatry;  Laterality:  Right;  Marland Kitchen HERNIA REPAIR  10/09/11   Ventral Incarcerated- Dr. [name]  . OOPHORECTOMY Left 12/09/2009  . TUBAL LIGATION  1993  . VENTRAL HERNIA REPAIR  10/09/2011    Family History  Problem Relation Age of Onset  . Hypertension Mother   . CVA Mother   . Kidney disease Mother   . Congenital heart disease Mother   . Heart  disease Father   . Hypertension Father   . Diabetes Father   . Breast cancer Paternal Grandmother        Bilateral  . Colon cancer Maternal Grandfather   . Colon cancer Maternal Uncle     Social History   Tobacco Use  . Smoking status: Never Smoker  . Smokeless tobacco: Never Used  Substance Use Topics  . Alcohol use: No    Alcohol/week: 0.0 standard drinks     Current Outpatient Medications:  .  acetaminophen (TYLENOL) 500 MG tablet, Take 1 tablet (500 mg total) by mouth every 6 (six) hours as needed. Max of 3 grams daily or 6 pills dialy (Patient taking differently: Take 1,000 mg by mouth every 6 (six) hours as needed for moderate pain or headache.), Disp: 30 tablet, Rfl: 0 .  aspirin EC 81 MG tablet, Take 1 tablet (81 mg total) by mouth daily. (Patient taking differently: Take 81 mg by mouth every evening.), Disp: 30 tablet, Rfl: 0 .  cetirizine (ZYRTEC) 10 MG tablet, Take 10 mg by mouth every evening., Disp: , Rfl:  .  Cholecalciferol (VITAMIN D) 2000 UNITS tablet, Take 2,000 Units by mouth every evening. , Disp: , Rfl:  .  docusate sodium (COLACE) 100 MG capsule, Take 100 mg by mouth daily as needed for moderate constipation. , Disp: , Rfl:  .  ferrous sulfate 325 (65 FE) MG tablet, Take 325 mg by mouth every evening. , Disp: , Rfl:  .  fluticasone (FLONASE) 50 MCG/ACT nasal spray, Place 2 sprays into both nostrils daily as needed for allergies., Disp: 16 g, Rfl: 1 .  ibuprofen (ADVIL) 200 MG tablet, Take 200-600 mg by mouth every 6 (six) hours as needed (for pain.)., Disp: , Rfl:  .  levonorgestrel (MIRENA) 20 MCG/24HR IUD, 1 each by Intrauterine route once. , Disp: , Rfl:  .  Multiple Vitamin (MULTIVITAMIN WITH MINERALS) TABS tablet, Take 1 tablet by mouth every evening., Disp: , Rfl:  .  nystatin (MYCOSTATIN/NYSTOP) powder, Apply 1 application topically 2 (two) times daily as needed., Disp: 60 g, Rfl: 0 .  TRAVATAN Z 0.004 % SOLN ophthalmic solution, Place 1 drop into both  eyes at bedtime., Disp: , Rfl: 3 .  Armodafinil (NUVIGIL) 150 MG tablet, Take 1 tablet (150 mg total) by mouth daily as needed (shift work--sleepiness)., Disp: 90 tablet, Rfl: 0 .  DULoxetine (CYMBALTA) 60 MG capsule, Take 2 capsules (120 mg total) by mouth every evening., Disp: 180 capsule, Rfl: 0 .  metaxalone (SKELAXIN) 800 MG tablet, Take 1 tablet (800 mg total) by mouth 3 (three) times daily as needed for muscle spasms., Disp: 90 tablet, Rfl: 0 .  olmesartan (BENICAR) 40 MG tablet, Take 1 tablet (40 mg total) by mouth daily., Disp: 90 tablet, Rfl: 0  Allergies  Allergen Reactions  . Lisinopril Cough       . Norvasc [Amlodipine Besylate]     I personally reviewed active problem list, medication list, allergies, family history, social history, health maintenance with the patient/caregiver today.   ROS  Constitutional: Negative for fever or weight  change.  Respiratory: Negative for cough and shortness of breath.   Cardiovascular: Negative for chest pain or palpitations.  Gastrointestinal: Negative for abdominal pain, no bowel changes.  Musculoskeletal: Negative for gait problem or joint swelling.  Skin: Negative for rash.   Neurological: Negative for dizziness or headache.  No other specific complaints in a complete review of systems (except as listed in HPI above).  Objective  Vitals:   05/24/20 1426  BP: 140/88  Pulse: 96  Resp: 16  Temp: 98 F (36.7 C)  TempSrc: Oral  SpO2: 99%  Weight: 235 lb (106.6 kg)  Height: 5\' 2"  (1.575 m)    Body mass index is 42.98 kg/m.  Physical Exam  Constitutional: Patient appears well-developed and well-nourished. Obese  No distress.  HEENT: head atraumatic, normocephalic, pupils equal and reactive to light,  neck supple Cardiovascular: Normal rate, regular rhythm and normal heart sounds.  Systolic  murmur heard. No BLE edema. Pulmonary/Chest: Effort normal and breath sounds normal. No respiratory distress. Abdominal: Soft.  There  is no tenderness. Psychiatric: Patient has a normal mood and affect. behavior is normal. Judgment and thought content normal.   Recent Results (from the past 2160 hour(s))  Novel Coronavirus, NAA ([facility])     Status: None   Collection Time: 03/15/20 12:00 AM   Specimen: Nasopharyngeal(NP) swabs in vial transport medium   Nasopharynge  Result Value Ref Range   SARS-CoV-2, NAA Not Detected Not Detected    Comment: This nucleic acid amplification test was developed and its performance characteristics determined by 03/17/20. Nucleic acid amplification tests include RT-PCR and TMA. This test has not been FDA cleared or approved. This test has been authorized by FDA under an Emergency Use Authorization (EUA). This test is only authorized for the duration of time the declaration that circumstances exist justifying the authorization of the emergency use of in vitro diagnostic tests for detection of SARS-CoV-2 virus and/or diagnosis of COVID-19 infection under section 564(b)(1) of the Act, 21 U.S.C. [employer]) (1), unless the authorization is terminated or revoked sooner. When diagnostic testing is negative, the possibility of a false negative result should be considered in the context of a patient's recent exposures and the presence of clinical signs and symptoms consistent with COVID-19. An individual without symptoms of COVID-19 and who is not shedding SARS-CoV-2 virus wo uld expect to have a negative (not detected) result in this assay.   SARS-COV-2, NAA 2 DAY TAT     Status: None   Collection Time: 03/15/20 12:00 AM   Nasopharynge  Result Value Ref Range   SARS-CoV-2, NAA 2 DAY TAT Performed   Specimen status report     Status: None   Collection Time: 03/15/20 12:00 AM  Result Value Ref Range   specimen status report Comment     Comment: Please note Please note The date and/or time of collection was not indicated on the requisition as required by state and federal  law.  The date of receipt of the specimen was used as the collection date if not supplied.     PHQ2/9: Depression screen [facility] 2/9 05/24/2020 03/15/2020 04/15/2019 10/18/2018 08/07/2018  Decreased Interest 0 0 1 2 1   Down, Depressed, Hopeless 0 0 0 2 1  PHQ - 2 Score 0 0 1 4 2   Altered sleeping 0 - 0 1 1  Tired, decreased energy 1 - 1 2 1   Change in appetite 0 - 1 2 1   Feeling bad or failure about yourself  0 -  0 2 1  Trouble concentrating 0 - 0 2 1  Moving slowly or fidgety/restless 0 - 0 0 0  Suicidal thoughts 0 - 0 0 0  PHQ-9 Score 1 - 3 13 7   Difficult doing work/chores - - Not difficult at all Somewhat difficult Somewhat difficult  Some recent data might be hidden    phq 9 is negative   Fall Risk: Fall Risk  05/24/2020 03/15/2020 04/15/2019 10/18/2018 07/17/2018  Falls in the past year? 0 0 0 0 0  Number falls in past yr: 0 0 0 0 0  Injury with Fall? 0 0 0 0 0     Functional Status Survey: Is the patient deaf or have difficulty hearing?: No Does the patient have difficulty seeing, even when wearing glasses/contacts?: No Does the patient have difficulty concentrating, remembering, or making decisions?: No Does the patient have difficulty walking or climbing stairs?: Yes Does the patient have difficulty dressing or bathing?: No Does the patient have difficulty doing errands alone such as visiting a doctor's office or shopping?: No    Assessment & Plan  1. Depression, major, recurrent, mild (HCC)  - DULoxetine (CYMBALTA) 60 MG capsule; Take 2 capsules (120 mg total) by mouth every evening.  Dispense: 180 capsule; Refill: 0  2. Encounter for hepatitis C screening test for low risk patient   3. Obstructive apnea  - Armodafinil (NUVIGIL) 150 MG tablet; Take 1 tablet (150 mg total) by mouth daily as needed (shift work--sleepiness).  Dispense: 90 tablet; Refill: 0  4. Shift work sleep disorder  - Armodafinil (NUVIGIL) 150 MG tablet; Take 1 tablet (150 mg total) by mouth daily  as needed (shift work--sleepiness).  Dispense: 90 tablet; Refill: 0  5. Fibromyalgia  - Armodafinil (NUVIGIL) 150 MG tablet; Take 1 tablet (150 mg total) by mouth daily as needed (shift work--sleepiness).  Dispense: 90 tablet; Refill: 0 - DULoxetine (CYMBALTA) 60 MG capsule; Take 2 capsules (120 mg total) by mouth every evening.  Dispense: 180 capsule; Refill: 0 - metaxalone (SKELAXIN) 800 MG tablet; Take 1 tablet (800 mg total) by mouth 3 (three) times daily as needed for muscle spasms.  Dispense: 90 tablet; Refill: 0  6. Colon cancer screening  - Ambulatory referral to Gastroenterology  7. Essential (primary) hypertension  - olmesartan (BENICAR) 40 MG tablet; Take 1 tablet (40 mg total) by mouth daily.  Dispense: 90 tablet; Refill: 0 - COMPLETE METABOLIC PANEL WITH GFR  8. Dyslipidemia  - Lipid panel  9. Morbid obesity (HCC)  Discussed with the patient the risk posed by an increased BMI. Discussed importance of portion control, calorie counting and at least 150 minutes of physical activity weekly. Avoid sweet beverages and drink more water. Eat at least 6 servings of fruit and vegetables daily   10. Hyperglycemia  - Hemoglobin A1c  11. Dysmetabolic syndrome  - Hemoglobin A1c  12. Seasonal allergic rhinitis, unspecified trigger   13. History of iron deficiency  - CBC with Differential/Platelet  14. Encounter for screening mammogram for malignant neoplasm of breast  - MM 3D SCREEN BREAST BILATERAL; Future.

## 2020-05-24 ENCOUNTER — Other Ambulatory Visit: Payer: Self-pay

## 2020-05-24 ENCOUNTER — Ambulatory Visit: Payer: 59 | Admitting: Family Medicine

## 2020-05-24 ENCOUNTER — Encounter: Payer: Self-pay | Admitting: Family Medicine

## 2020-05-24 VITALS — BP 140/88 | HR 96 | Temp 98.0°F | Resp 16 | Ht 62.0 in | Wt 235.0 lb

## 2020-05-24 DIAGNOSIS — J302 Other seasonal allergic rhinitis: Secondary | ICD-10-CM

## 2020-05-24 DIAGNOSIS — E8881 Metabolic syndrome: Secondary | ICD-10-CM

## 2020-05-24 DIAGNOSIS — G4733 Obstructive sleep apnea (adult) (pediatric): Secondary | ICD-10-CM | POA: Diagnosis not present

## 2020-05-24 DIAGNOSIS — M797 Fibromyalgia: Secondary | ICD-10-CM | POA: Diagnosis not present

## 2020-05-24 DIAGNOSIS — R739 Hyperglycemia, unspecified: Secondary | ICD-10-CM

## 2020-05-24 DIAGNOSIS — Z1231 Encounter for screening mammogram for malignant neoplasm of breast: Secondary | ICD-10-CM

## 2020-05-24 DIAGNOSIS — Z1159 Encounter for screening for other viral diseases: Secondary | ICD-10-CM

## 2020-05-24 DIAGNOSIS — I10 Essential (primary) hypertension: Secondary | ICD-10-CM

## 2020-05-24 DIAGNOSIS — G4726 Circadian rhythm sleep disorder, shift work type: Secondary | ICD-10-CM

## 2020-05-24 DIAGNOSIS — Z8639 Personal history of other endocrine, nutritional and metabolic disease: Secondary | ICD-10-CM

## 2020-05-24 DIAGNOSIS — E785 Hyperlipidemia, unspecified: Secondary | ICD-10-CM

## 2020-05-24 DIAGNOSIS — Z1211 Encounter for screening for malignant neoplasm of colon: Secondary | ICD-10-CM

## 2020-05-24 DIAGNOSIS — F330 Major depressive disorder, recurrent, mild: Secondary | ICD-10-CM

## 2020-05-24 MED ORDER — OLMESARTAN MEDOXOMIL 40 MG PO TABS: 40.0000 mg | ORAL_TABLET | Freq: Every day | ORAL | 0 refills | Status: DC

## 2020-05-24 MED ORDER — METAXALONE 800 MG PO TABS: 800.0000 mg | ORAL_TABLET | Freq: Three times a day (TID) | ORAL | 0 refills | Status: DC | PRN

## 2020-05-24 MED ORDER — OLMESARTAN MEDOXOMIL-HCTZ 40-12.5 MG PO TABS: 1.0000 | ORAL_TABLET | Freq: Every day | ORAL | 0 refills | Status: DC

## 2020-05-24 MED ORDER — DULOXETINE HCL 60 MG PO CPEP: 120.0000 mg | ORAL_CAPSULE | Freq: Every evening | ORAL | 0 refills | Status: DC

## 2020-05-24 MED ORDER — ARMODAFINIL 150 MG PO TABS: 150.0000 mg | ORAL_TABLET | Freq: Every day | ORAL | 0 refills | Status: DC | PRN

## 2020-05-25 LAB — CBC WITH DIFFERENTIAL/PLATELET
Absolute Monocytes: 907 cells/uL (ref 200–950)
Basophils Absolute: 50 cells/uL (ref 0–200)
Basophils Relative: 0.4 %
Eosinophils Absolute: 340 cells/uL (ref 15–500)
Eosinophils Relative: 2.7 %
HCT: 38.2 % (ref 35.0–45.0)
Hemoglobin: 12.2 g/dL (ref 11.7–15.5)
Lymphs Abs: 3717 cells/uL (ref 850–3900)
MCH: 25.9 pg — ABNORMAL LOW (ref 27.0–33.0)
MCHC: 31.9 g/dL — ABNORMAL LOW (ref 32.0–36.0)
MCV: 81.1 fL (ref 80.0–100.0)
MPV: 9.2 fL (ref 7.5–12.5)
Monocytes Relative: 7.2 %
Neutro Abs: 7585 cells/uL (ref 1500–7800)
Neutrophils Relative %: 60.2 %
Platelets: 362 10*3/uL (ref 140–400)
RBC: 4.71 10*6/uL (ref 3.80–5.10)
RDW: 15.4 % — ABNORMAL HIGH (ref 11.0–15.0)
Total Lymphocyte: 29.5 %
WBC: 12.6 10*3/uL — ABNORMAL HIGH (ref 3.8–10.8)

## 2020-05-25 LAB — COMPLETE METABOLIC PANEL WITH GFR
AG Ratio: 1.3 (calc) (ref 1.0–2.5)
ALT: 13 U/L (ref 6–29)
AST: 16 U/L (ref 10–35)
Albumin: 4 g/dL (ref 3.6–5.1)
Alkaline phosphatase (APISO): 80 U/L (ref 37–153)
BUN: 12 mg/dL (ref 7–25)
CO2: 28 mmol/L (ref 20–32)
Calcium: 9.4 mg/dL (ref 8.6–10.4)
Chloride: 104 mmol/L (ref 98–110)
Creat: 0.52 mg/dL (ref 0.50–1.05)
GFR, Est African American: 128 mL/min/{1.73_m2} (ref >=60)
GFR, Est Non African American: 111 mL/min/{1.73_m2} (ref >=60)
Globulin: 3.1 g/dL (calc) (ref 1.9–3.7)
Glucose, Bld: 77 mg/dL (ref 65–99)
Potassium: 4.3 mmol/L (ref 3.5–5.3)
Sodium: 143 mmol/L (ref 135–146)
Total Bilirubin: 0.2 mg/dL (ref 0.2–1.2)
Total Protein: 7.1 g/dL (ref 6.1–8.1)

## 2020-05-25 LAB — LIPID PANEL
Cholesterol: 189 mg/dL (ref <200)
HDL: 46 mg/dL — ABNORMAL LOW (ref >=50)
LDL Cholesterol (Calc): 109 mg/dL (calc) — ABNORMAL HIGH
Non-HDL Cholesterol (Calc): 143 mg/dL (calc) — ABNORMAL HIGH (ref <130)
Total CHOL/HDL Ratio: 4.1 (calc) (ref <5.0)
Triglycerides: 225 mg/dL — ABNORMAL HIGH (ref <150)

## 2020-05-25 LAB — HEPATITIS C ANTIBODY
Hepatitis C Ab: NONREACTIVE
SIGNAL TO CUT-OFF: 0.01 (ref <1.00)

## 2020-05-25 LAB — HEMOGLOBIN A1C
Hgb A1c MFr Bld: 5.9 % of total Hgb — ABNORMAL HIGH (ref <5.7)
Mean Plasma Glucose: 123 mg/dL
eAG (mmol/L): 6.8 mmol/L

## 2020-06-02 NOTE — Progress Notes (Addendum)
Name: Kathleen Conley   MRN: [id number]    DOB: 03/04/1970   Date:06/03/2020       Progress Note  Subjective  Chief Complaint  Bump- Left Arm  HPI  Hydradenitis: she has a history of hidradenitis suppurative, seen by surgeon in the past and given reassurance. She avoids shaving and keeps area clean, however over the weekend developed pain, swelling and pressure, three days ago it rupture and draining purulent material with a foul odor, she states pain has improved since the drainage began, she is using warm water and keeping area clean. No fever, chills, arm edema.   Patient Active Problem List   Diagnosis Date Noted  . Knee pain 03/15/2020  . Morbid obesity (HCC) 04/17/2018  . 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 Flat foot 01/28/2016  . Aortic sclerosis 10/14/2015  . Midline low back pain without sciatica 09/20/2015  . Leukocytosis 11/29/2014  . Osteoarthritis of left knee 11/20/2014  . History of ventral hernia repair 10/27/2014  . Allergic rhinitis 08/17/2014  . Axillary hidradenitis suppurativa 08/17/2014  . Baker cyst 08/17/2014  . Chronic constipation 08/17/2014  . 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 Engages in travel abroad 08/17/2014  . Fibromyalgia 08/17/2014  . Cardiac murmur 08/17/2014  . Dysmetabolic syndrome 08/17/2014  . Plantar fasciitis 08/17/2014  . Beat, premature ventricular 08/17/2014  . 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 Obstructive apnea 12/02/2013  . Essential (primary) hypertension 12/09/2009  . Hypertrichosis 12/09/2009    Past Surgical History:  Procedure Laterality Date  . ACHILLES TENDON SURGERY Left 08/15/2019   Procedure: TRIPLE ARTHRODESIS AND ACHILLES TENDON LENGTHENING LEFT FOOT;  Surgeon: [name], DPM;  Location: [facility];  Service: Podiatry;  Laterality: Left;  . FOOT ARTHRODESIS Right 12/01/2016   Procedure: ARTHRODESIS FOOT; TRIPLE;  Surgeon:  [name], DPM;  Location: [facility];  Service: Podiatry;  Laterality: Right;  . HERNIA REPAIR  10/09/11   Ventral Incarcerated- Dr. [name]  . OOPHORECTOMY Left 12/09/2009  . TUBAL LIGATION  1993  . VENTRAL HERNIA REPAIR  10/09/2011    Family History  Problem Relation Age of Onset  . Hypertension Mother   . CVA Mother   . Kidney disease Mother   . Congenital heart disease Mother   . Heart disease Father   . Hypertension Father   . Diabetes Father   . Breast cancer Paternal Grandmother        Bilateral  . Colon cancer Maternal Grandfather   . Colon cancer Maternal Uncle     Social History   Tobacco Use  . Smoking status: Never Smoker  . Smokeless tobacco: Never Used  Substance Use Topics  . Alcohol use: No    Alcohol/week: 0.0 standard drinks     Current Outpatient Medications:  .  acetaminophen (TYLENOL) 500 MG tablet, Take 1 tablet (500 mg total) by mouth every 6 (six) hours as needed. Max of 3 grams daily or 6 pills dialy (Patient taking differently: Take 1,000 mg by mouth every 6 (six) hours as needed for moderate pain or headache.), Disp: 30 tablet, Rfl: 0 .  Armodafinil (NUVIGIL) 150 MG tablet, Take 1 tablet (150 mg total) by mouth daily as needed (shift work--sleepiness)., Disp: 90 tablet, Rfl: 0 .  aspirin EC 81 MG tablet, Take 1 tablet (81 mg total) by mouth daily. (Patient taking differently: Take 81 mg by mouth every evening.), Disp: 30 tablet, Rfl: 0 .  cetirizine (ZYRTEC) 10 MG  tablet, Take 10 mg by mouth every evening., Disp: , Rfl:  .  Cholecalciferol (VITAMIN D) 2000 UNITS tablet, Take 2,000 Units by mouth every evening. , Disp: , Rfl:  .  docusate sodium (COLACE) 100 MG capsule, Take 100 mg by mouth daily as needed for moderate constipation. , Disp: , Rfl:  .  doxycycline (VIBRA-TABS) 100 MG tablet, Take 1 tablet (100 mg total) by mouth 2 (two) times daily., Disp: 14 tablet, Rfl: 0 .  DULoxetine (CYMBALTA) 60 MG capsule, Take 2 capsules (120 mg total) by  mouth every evening., Disp: 180 capsule, Rfl: 0 .  ferrous sulfate 325 (65 FE) MG tablet, Take 325 mg by mouth every evening. , Disp: , Rfl:  .  fluticasone (FLONASE) 50 MCG/ACT nasal spray, Place 2 sprays into both nostrils daily as needed for allergies., Disp: 16 g, Rfl: 1 .  ibuprofen (ADVIL) 200 MG tablet, Take 200-600 mg by mouth every 6 (six) hours as needed (for pain.)., Disp: , Rfl:  .  levonorgestrel (MIRENA) 20 MCG/24HR IUD, 1 each by Intrauterine route once. , Disp: , Rfl:  .  metaxalone (SKELAXIN) 800 MG tablet, Take 1 tablet (800 mg total) by mouth 3 (three) times daily as needed for muscle spasms., Disp: 90 tablet, Rfl: 0 .  Multiple Vitamin (MULTIVITAMIN WITH MINERALS) TABS tablet, Take 1 tablet by mouth every evening., Disp: , Rfl:  .  nystatin (MYCOSTATIN/NYSTOP) powder, Apply 1 application topically 2 (two) times daily as needed., Disp: 60 g, Rfl: 0 .  olmesartan-hydrochlorothiazide (BENICAR HCT) 40-12.5 MG tablet, Take 1 tablet by mouth daily., Disp: 90 tablet, Rfl: 0 .  TRAVATAN Z 0.004 % SOLN ophthalmic solution, Place 1 drop into both eyes at bedtime., Disp: , Rfl: 3  Allergies  Allergen Reactions  . Lisinopril Cough       . Norvasc [Amlodipine Besylate]     I personally reviewed active problem list, medication list, allergies, family history, social history with the patient/caregiver today.   ROS  Ten systems reviewed and is negative except as mentioned in HPI   Objective  Vitals:   06/03/20 0901  BP: 128/82  Pulse: 100  Resp: 18  Temp: 98.2 F (36.8 C)  TempSrc: Oral  SpO2: 97%  Weight: 235 lb 4.8 oz (106.7 kg)  Height: 5\' 2"  (1.575 m)    Body mass index is 43.04 kg/m.  Physical Exam  Constitutional: Patient appears well-developed and well-nourished. Obese  No distress.  HEENT: head atraumatic, normocephalic, pupils equal and reactive to light,  neck supple Cardiovascular: Normal rate, regular rhythm and normal heart sounds.  2/6systolic  murmur  heard. No BLE edema. Axilla: large area of erythema and induration, a dime size opening in the center with some drainage, yellow in color. Some tenderness to touch  Pulmonary/Chest: Effort normal and breath sounds normal. No respiratory distress. Abdominal: Soft.  There is no tenderness. Psychiatric: Patient has a normal mood and affect. behavior is normal. Judgment and thought content normal.  Recent Results (from the past 2160 hour(s))  Novel Coronavirus, NAA ([facility])     Status: None   Collection Time: 03/15/20 12:00 AM   Specimen: Nasopharyngeal(NP) swabs in vial transport medium   Nasopharynge  Result Value Ref Range   SARS-CoV-2, NAA Not Detected Not Detected    Comment: This nucleic acid amplification test was developed and its performance characteristics determined by 03/17/20. Nucleic acid amplification tests include RT-PCR and TMA. This test has not been FDA cleared or approved. This test  has been authorized by FDA under an Emergency Use Authorization (EUA). This test is only authorized for the duration of time the declaration that circumstances exist justifying the authorization of the emergency use of in vitro diagnostic tests for detection of SARS-CoV-2 virus and/or diagnosis of COVID-19 infection under section 564(b)(1) of the Act, 21 U.S.C. 010XNA-3(F) (1), unless the authorization is terminated or revoked sooner. When diagnostic testing is negative, the possibility of a false negative result should be considered in the context of a patient's recent exposures and the presence of clinical signs and symptoms consistent with COVID-19. An individual without symptoms of COVID-19 and who is not shedding SARS-CoV-2 virus wo uld expect to have a negative (not detected) result in this assay.   SARS-COV-2, NAA 2 DAY TAT     Status: None   Collection Time: 03/15/20 12:00 AM   Nasopharynge  Result Value Ref Range   SARS-CoV-2, NAA 2 DAY TAT Performed   Specimen  status report     Status: None   Collection Time: 03/15/20 12:00 AM  Result Value Ref Range   specimen status report Comment     Comment: Please note Please note The date and/or time of collection was not indicated on the requisition as required by state and federal law.  The date of receipt of the specimen was used as the collection date if not supplied.   Lipid panel     Status: Abnormal   Collection Time: 05/24/20  3:09 PM  Result Value Ref Range   Cholesterol 189 <200 mg/dL   HDL 46 (L) > OR = 50 mg/dL   Triglycerides 573 (H) <150 mg/dL    Comment: . If a non-fasting specimen was collected, consider repeat triglyceride testing on a fasting specimen if clinically indicated.  Perry Mount et al. J. of Clin. Lipidol. 2015;9:129-169. Marland Kitchen    LDL Cholesterol (Calc) 109 (H) mg/dL (calc)    Comment: Reference range: <100 . Desirable range <100 mg/dL for primary prevention;   <70 mg/dL for patients with CHD or diabetic patients  with > or = 2 CHD risk factors. Marland Kitchen LDL-C is now calculated using the Martin-Hopkins  calculation, which is a validated novel method providing  better accuracy than the Friedewald equation in the  estimation of LDL-C.  Horald Pollen et al. Lenox Ahr. 2202;542(70): 2061-2068  (http://education.[facility].com/faq/FAQ164)    Total CHOL/HDL Ratio 4.1 <5.0 (calc)   Non-HDL Cholesterol (Calc) 143 (H) <130 mg/dL (calc)    Comment: For patients with diabetes plus 1 major ASCVD risk  factor, treating to a non-HDL-C goal of <100 mg/dL  (LDL-C of <62 mg/dL) is considered a therapeutic  option.   COMPLETE METABOLIC PANEL WITH GFR     Status: None   Collection Time: 05/24/20  3:09 PM  Result Value Ref Range   Glucose, Bld 77 65 - 99 mg/dL    Comment: .            Fasting reference interval .    BUN 12 7 - 25 mg/dL   Creat 3.76 2.83 - 1.51 mg/dL    Comment: For patients >47 years of age, the reference limit for Creatinine is approximately 13% higher for  people identified as African-American. .    GFR, Est Non African American 111 > OR = 60 mL/min/1.65m2   GFR, Est African American 128 > OR = 60 mL/min/1.30m2   BUN/Creatinine Ratio NOT APPLICABLE 6 - 22 (calc)   Sodium 143 135 - 146 mmol/L   Potassium 4.3 3.5 -  5.3 mmol/L   Chloride 104 98 - 110 mmol/L   CO2 28 20 - 32 mmol/L   Calcium 9.4 8.6 - 10.4 mg/dL   Total Protein 7.1 6.1 - 8.1 g/dL   Albumin 4.0 3.6 - 5.1 g/dL   Globulin 3.1 1.9 - 3.7 g/dL (calc)   AG Ratio 1.3 1.0 - 2.5 (calc)   Total Bilirubin 0.2 0.2 - 1.2 mg/dL   Alkaline phosphatase (APISO) 80 37 - 153 U/L   AST 16 10 - 35 U/L   ALT 13 6 - 29 U/L  CBC with Differential/Platelet     Status: Abnormal   Collection Time: 05/24/20  3:09 PM  Result Value Ref Range   WBC 12.6 (H) 3.8 - 10.8 Thousand/uL   RBC 4.71 3.80 - 5.10 Million/uL   Hemoglobin 12.2 11.7 - 15.5 g/dL   HCT 16.1 09.6 - 04.5 %   MCV 81.1 80.0 - 100.0 fL   MCH 25.9 (L) 27.0 - 33.0 pg   MCHC 31.9 (L) 32.0 - 36.0 g/dL   RDW 40.9 (H) 81.1 - 91.4 %   Platelets 362 140 - 400 Thousand/uL   MPV 9.2 7.5 - 12.5 fL   Neutro Abs 7,585 1,500 - 7,800 cells/uL   Lymphs Abs 3,717 850 - 3,900 cells/uL   Absolute Monocytes 907 200 - 950 cells/uL   Eosinophils Absolute 340 15 - 500 cells/uL   Basophils Absolute 50 0 - 200 cells/uL   Neutrophils Relative % 60.2 %   Total Lymphocyte 29.5 %   Monocytes Relative 7.2 %   Eosinophils Relative 2.7 %   Basophils Relative 0.4 %  Hemoglobin A1c     Status: Abnormal   Collection Time: 05/24/20  3:09 PM  Result Value Ref Range   Hgb A1c MFr Bld 5.9 (H) <5.7 % of total Hgb    Comment: For someone without known diabetes, a hemoglobin  A1c value between 5.7% and 6.4% is consistent with prediabetes and should be confirmed with a  follow-up test. . For someone with known diabetes, a value <7% indicates that their diabetes is well controlled. A1c targets should be individualized based on duration of diabetes, age, comorbid  conditions, and other considerations. . This assay result is consistent with an increased risk of diabetes. . Currently, no consensus exists regarding use of hemoglobin A1c for diagnosis of diabetes for children. .    Mean Plasma Glucose 123 mg/dL   eAG (mmol/L) 6.8 mmol/L  Hepatitis C antibody     Status: None   Collection Time: 05/24/20  3:09 PM  Result Value Ref Range   Hepatitis C Ab NON-REACTIVE NON-REACTI   SIGNAL TO CUT-OFF 0.01 <1.00    Comment: . HCV antibody was non-reactive. There is no laboratory  evidence of HCV infection. . In most cases, no further action is required. However, if recent HCV exposure is suspected, a test for HCV RNA (test code 78295) is suggested. . For additional information please refer to http://education.[facility].com/faq/FAQ22v1 (This link is being provided for informational/ educational purposes only.) .       PHQ2/9: Depression screen [facility] 2/9 06/03/2020 05/24/2020 03/15/2020 04/15/2019 10/18/2018  Decreased Interest 0 0 0 1 2  Down, Depressed, Hopeless 0 0 0 0 2  PHQ - 2 Score 0 0 0 1 4  Altered sleeping 0 0 - 0 1  Tired, decreased energy 1 1 - 1 2  Change in appetite 0 0 - 1 2  Feeling bad or failure about yourself  0 0 -  0 2  Trouble concentrating 0 0 - 0 2  Moving slowly or fidgety/restless 0 0 - 0 0  Suicidal thoughts 0 0 - 0 0  PHQ-9 Score 1 1 - 3 13  Difficult doing work/chores - - - Not difficult at all Somewhat difficult  Some recent data might be hidden    phq 9 is negative   Fall Risk: Fall Risk  06/03/2020 05/24/2020 03/15/2020 04/15/2019 10/18/2018  Falls in the past year? 0 0 0 0 0  Number falls in past yr: 0 0 0 0 0  Injury with Fall? - 0 0 0 0  Follow up Falls prevention discussed - - - -    Functional Status Survey: Is the patient deaf or have difficulty hearing?: No Does the patient have difficulty seeing, even when wearing glasses/contacts?: No Does the patient have difficulty concentrating,  remembering, or making decisions?: No Does the patient have difficulty walking or climbing stairs?: Yes Does the patient have difficulty dressing or bathing?: No Does the patient have difficulty doing errands alone such as visiting a doctor's office or shopping?: No    Assessment & Plan  1. Left axillary hidradenitis  - Anaerobic and Aerobic Culture

## 2020-06-03 ENCOUNTER — Other Ambulatory Visit: Payer: Self-pay

## 2020-06-03 ENCOUNTER — Encounter: Payer: Self-pay | Admitting: Family Medicine

## 2020-06-03 ENCOUNTER — Ambulatory Visit: Payer: 59 | Admitting: Family Medicine

## 2020-06-03 VITALS — BP 128/82 | HR 100 | Temp 98.2°F | Resp 18 | Ht 62.0 in | Wt 235.3 lb

## 2020-06-03 DIAGNOSIS — L732 Hidradenitis suppurativa: Secondary | ICD-10-CM

## 2020-06-03 MED ORDER — DOXYCYCLINE HYCLATE 100 MG PO TABS: 100.0000 mg | ORAL_TABLET | Freq: Two times a day (BID) | ORAL | 0 refills | Status: DC

## 2020-06-09 ENCOUNTER — Encounter: Payer: Self-pay | Admitting: *Deleted

## 2020-06-10 ENCOUNTER — Telehealth (INDEPENDENT_AMBULATORY_CARE_PROVIDER_SITE_OTHER): Payer: Self-pay | Admitting: Gastroenterology

## 2020-06-10 ENCOUNTER — Other Ambulatory Visit: Payer: Self-pay

## 2020-06-10 DIAGNOSIS — Z1211 Encounter for screening for malignant neoplasm of colon: Secondary | ICD-10-CM

## 2020-06-10 LAB — ANAEROBIC AND AEROBIC CULTURE
MICRO NUMBER:: 11776020
MICRO NUMBER:: 11776021
SPECIMEN QUALITY:: ADEQUATE
SPECIMEN QUALITY:: ADEQUATE

## 2020-06-10 MED ORDER — NA SULFATE-K SULFATE-MG SULF 17.5-3.13-1.6 GM/177ML PO SOLN: 1.0000 | Freq: Once | ORAL | 0 refills | Status: AC

## 2020-06-10 NOTE — Progress Notes (Signed)
Gastroenterology Pre-Procedure Review  Request Date: fRIDAY 07/02/20 Requesting Physician: Dr. Maximino Greenland  PATIENT REVIEW QUESTIONS: The patient responded to the following health history questions as indicated:    1. Are you having any GI issues? no 2. Do you have a personal history of Polyps? no 3. Do you have a family history of Colon Cancer or Polyps?YES PATERNAL GRANDFATHER COLON CANCER 4. Diabetes Mellitus? no 5. Joint replacements in the past 12 months?no 6. Major health problems in the past 3 months?no 7. Any artificial heart valves, MVP, or defibrillator?no    MEDICATIONS & ALLERGIES:    Patient reports the following regarding taking any anticoagulation/antiplatelet therapy:   Plavix, Coumadin, Eliquis, Xarelto, Lovenox, Pradaxa, Brilinta, or Effient? NO Aspirin?YES 81 MG DAILY  Patient confirms/reports the following medications:  Current Outpatient Medications  Medication Sig Dispense Refill  . acetaminophen (TYLENOL) 500 MG tablet Take 1 tablet (500 mg total) by mouth every 6 (six) hours as needed. Max of 3 grams daily or 6 pills dialy (Patient taking differently: Take 1,000 mg by mouth every 6 (six) hours as needed for moderate pain or headache.) 30 tablet 0  . Armodafinil (NUVIGIL) 150 MG tablet Take 1 tablet (150 mg total) by mouth daily as needed (shift work--sleepiness). 90 tablet 0  . aspirin EC 81 MG tablet Take 1 tablet (81 mg total) by mouth daily. (Patient taking differently: Take 81 mg by mouth every evening.) 30 tablet 0  . cetirizine (ZYRTEC) 10 MG tablet Take 10 mg by mouth every evening.    . Cholecalciferol (VITAMIN D) 2000 UNITS tablet Take 2,000 Units by mouth every evening.     . docusate sodium (COLACE) 100 MG capsule Take 100 mg by mouth daily as needed for moderate constipation.     Marland Kitchen doxycycline (VIBRA-TABS) 100 MG tablet Take 1 tablet (100 mg total) by mouth 2 (two) times daily. 14 tablet 0  . DULoxetine (CYMBALTA) 60 MG capsule Take 2 capsules (120 mg  total) by mouth every evening. 180 capsule 0  . ferrous sulfate 325 (65 FE) MG tablet Take 325 mg by mouth every evening.     . fluticasone (FLONASE) 50 MCG/ACT nasal spray Place 2 sprays into both nostrils daily as needed for allergies. 16 g 1  . ibuprofen (ADVIL) 200 MG tablet Take 200-600 mg by mouth every 6 (six) hours as needed (for pain.).    Marland Kitchen levonorgestrel (MIRENA) 20 MCG/24HR IUD 1 each by Intrauterine route once.     . metaxalone (SKELAXIN) 800 MG tablet Take 1 tablet (800 mg total) by mouth 3 (three) times daily as needed for muscle spasms. 90 tablet 0  . Multiple Vitamin (MULTIVITAMIN WITH MINERALS) TABS tablet Take 1 tablet by mouth every evening.    . nystatin (MYCOSTATIN/NYSTOP) powder Apply 1 application topically 2 (two) times daily as needed. 60 g 0  . olmesartan-hydrochlorothiazide (BENICAR HCT) 40-12.5 MG tablet Take 1 tablet by mouth daily. 90 tablet 0  . TRAVATAN Z 0.004 % SOLN ophthalmic solution Place 1 drop into both eyes at bedtime.  3   No current facility-administered medications for this visit.    Patient confirms/reports the following allergies:  Allergies  Allergen Reactions  . Lisinopril Cough       . Norvasc [Amlodipine Besylate]     No orders of the defined types were placed in this encounter.   AUTHORIZATION INFORMATION Primary Insurance: 1D#: Group #:  Secondary Insurance: 1D#: Group #:  SCHEDULE INFORMATION: Date: 07/02/20 Time: Location:[facility]

## 2020-06-15 ENCOUNTER — Encounter: Payer: Self-pay | Admitting: *Deleted

## 2020-06-21 ENCOUNTER — Telehealth: Payer: Self-pay

## 2020-06-21 ENCOUNTER — Other Ambulatory Visit: Payer: Self-pay

## 2020-06-21 DIAGNOSIS — Z1211 Encounter for screening for malignant neoplasm of colon: Secondary | ICD-10-CM

## 2020-06-21 NOTE — Telephone Encounter (Signed)
I will go ahead and reschedule patient's procedure to 07/23/2020. Patient will be notified.

## 2020-06-21 NOTE — Telephone Encounter (Signed)
Patient says she has a procedure scheduled with Dr. Karie Schwalbe on 5/13 and needs to cancel. Says May is a bad month for her. Is open to rescheduling the procedure in June, 2022.

## 2020-07-23 ENCOUNTER — Other Ambulatory Visit: Payer: Self-pay

## 2020-07-23 ENCOUNTER — Encounter: Payer: Self-pay | Admitting: Gastroenterology

## 2020-07-23 ENCOUNTER — Ambulatory Visit: Payer: 59 | Admitting: Registered Nurse

## 2020-07-23 ENCOUNTER — Ambulatory Visit
Admission: RE | Admit: 2020-07-23 | Discharge: 2020-07-23 | Disposition: A | Discharge status: Home / Self Care | Payer: 59 | Source: Ambulatory Visit | Attending: Gastroenterology | Admitting: Gastroenterology

## 2020-07-23 ENCOUNTER — Encounter
Admission: RE | Disposition: A | Discharge status: Home / Self Care | Payer: Self-pay | Source: Ambulatory Visit | Attending: Gastroenterology

## 2020-07-23 DIAGNOSIS — Z1211 Encounter for screening for malignant neoplasm of colon: Secondary | ICD-10-CM

## 2020-07-23 DIAGNOSIS — K5730 Diverticulosis of large intestine without perforation or abscess without bleeding: Secondary | ICD-10-CM | POA: Insufficient documentation

## 2020-07-23 DIAGNOSIS — K648 Other hemorrhoids: Secondary | ICD-10-CM | POA: Insufficient documentation

## 2020-07-23 HISTORY — PX: COLONOSCOPY WITH PROPOFOL: SHX5780

## 2020-07-23 SURGERY — COLONOSCOPY WITH PROPOFOL
Anesthesia: General

## 2020-07-23 MED ORDER — LIDOCAINE HCL (CARDIAC) PF 100 MG/5ML IV SOSY
PREFILLED_SYRINGE | INTRAVENOUS | Status: DC | PRN
  Administered 2020-07-23: 40 mg via INTRAVENOUS

## 2020-07-23 MED ORDER — PROPOFOL 10 MG/ML IV BOLUS
INTRAVENOUS | Status: DC | PRN
  Administered 2020-07-23: 80 mg via INTRAVENOUS

## 2020-07-23 MED ORDER — SODIUM CHLORIDE 0.9 % IV SOLN: INTRAVENOUS | Status: DC

## 2020-07-23 MED ORDER — PROPOFOL 500 MG/50ML IV EMUL
INTRAVENOUS | Status: DC | PRN
  Administered 2020-07-23: 150 ug/kg/min via INTRAVENOUS

## 2020-07-23 MED ORDER — PROPOFOL 500 MG/50ML IV EMUL
INTRAVENOUS | Status: AC
  Filled 2020-07-23: qty 50

## 2020-07-23 NOTE — H&P (Signed)
Melodie Bouillon, MD [address], [place], [place], [address] [address], [place], [place], [address] Phone: [phone]  Fax: [phone]  Primary Care Physician:  Alba Cory, MD   Pre-Procedure History & Physical: HPI:  Kathleen Conley is a 50 y.o. female is here for a colonoscopy.   Past Medical History:  Diagnosis Date  . 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 Allergy   . Anemia    Iron Deficiency  . Arthritis   . Astigmatism 08/21/2017   Dr. Marcellus Scott  . Baker cyst   . Breast mass, right 01/18/2010  . Cardiac murmur   . Chronic constipation   . Depression   . Dysmetabolic syndrome   . Fibromyalgia   . Hairiness   . Hydradenitis   . Hypertension   . Incarcerated ventral hernia 09/29/2011  . 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 Marcellus Scott  . Myopia 08/21/2017   Dr. Marcellus Scott - [facility]  . Obesity   . Obstructive sleep apnea    no CPAP  . Open-angle glaucoma of both eyes, mild stage 08/21/2017   Dr. Marcellus Scott - [facility]  . Plantar fasciitis   . Pre-diabetes   . Presbyopia 07/02/219   Dr. Marcellus Scott  . PVC (premature ventricular contraction)     Past Surgical History:  Procedure Laterality Date  . ACHILLES TENDON SURGERY Left 08/15/2019   Procedure: TRIPLE ARTHRODESIS AND ACHILLES TENDON LENGTHENING LEFT FOOT;  Surgeon: [name], DPM;  Location: [facility] ORS;  Service: Podiatry;  Laterality: Left;  . FOOT ARTHRODESIS Right 12/01/2016   Procedure: ARTHRODESIS FOOT; TRIPLE;  Surgeon: [name], DPM;  Location: [facility] ORS;  Service: Podiatry;  Laterality: Right;  . HERNIA REPAIR  10/09/11   Ventral Incarcerated- Dr. [name]  . OOPHORECTOMY Left 12/09/2009  . TUBAL LIGATION  1993  . VENTRAL HERNIA REPAIR  10/09/2011    Prior to Admission medications   Medication Sig Start Date End Date Taking? Authorizing Provider  cetirizine (ZYRTEC) 10 MG  tablet Take 10 mg by mouth every evening.   Yes [provider]  Cholecalciferol (VITAMIN D) 2000 UNITS tablet Take 2,000 Units by mouth every evening.    Yes [provider]  DULoxetine (CYMBALTA) 60 MG capsule Take 2 capsules (120 mg total) by mouth every evening. 05/24/20  Yes Sowles, Danna Hefty, MD  olmesartan-hydrochlorothiazide (BENICAR HCT) 40-12.5 MG tablet Take 1 tablet by mouth daily. 05/24/20  Yes Sowles, Danna Hefty, MD  TRAVATAN Z 0.004 % SOLN ophthalmic solution Place 1 drop into both eyes at bedtime. 08/21/17  Yes Pa, [facility]  acetaminophen (TYLENOL) 500 MG tablet Take 1 tablet (500 mg total) by mouth every 6 (six) hours as needed. Max of 3 grams daily or 6 pills dialy Patient taking differently: Take 1,000 mg by mouth every 6 (six) hours as needed for moderate pain or headache. 06/18/15   Alba Cory, MD  Armodafinil (NUVIGIL) 150 MG tablet Take 1 tablet (150 mg total) by mouth daily as needed (shift work--sleepiness). 05/24/20   Alba Cory, MD  aspirin EC 81 MG tablet Take 1 tablet (81 mg total) by mouth daily. Patient taking differently: Take 81 mg by mouth every evening. 01/28/16   Alba Cory, MD  docusate sodium (COLACE) 100 MG capsule Take 100 mg by mouth daily as needed for moderate constipation.     [provider]  doxycycline (VIBRA-TABS) 100 MG tablet Take 1  tablet (100 mg total) by mouth 2 (two) times daily. Patient not taking: Reported on 07/23/2020 06/03/20   Alba Cory, MD  ferrous sulfate 325 (65 FE) MG tablet Take 325 mg by mouth every evening.     [provider]  fluticasone (FLONASE) 50 MCG/ACT nasal spray Place 2 sprays into both nostrils daily as needed for allergies. 10/18/18   Alba Cory, MD  ibuprofen (ADVIL) 200 MG tablet Take 200-600 mg by mouth every 6 (six) hours as needed (for pain.).    [provider]  levonorgestrel (MIRENA) 20 MCG/24HR IUD 1 each by Intrauterine route once.     [provider]  metaxalone (SKELAXIN) 800 MG tablet Take 1 tablet (800 mg total) by mouth 3 (three) times daily as needed for muscle spasms. 05/24/20   Alba Cory, MD  Multiple Vitamin (MULTIVITAMIN WITH MINERALS) TABS tablet Take 1 tablet by mouth every evening.    [provider]  nystatin (MYCOSTATIN/NYSTOP) powder Apply 1 application topically 2 (two) times daily as needed. 07/31/19   Vena Austria, MD    Allergies as of 06/21/2020 - Review Complete 06/03/2020  Allergen Reaction Noted  . Lisinopril Cough 09/14/2016  . Norvasc [amlodipine besylate]  03/14/2018    Family History  Problem Relation Age of Onset  . Hypertension Mother   . CVA Mother   . Kidney disease Mother   . Congenital heart disease Mother   . Heart disease Father   . Hypertension Father   . Diabetes Father   . Breast cancer Paternal Grandmother        Bilateral  . Colon cancer Maternal Grandfather   . Colon cancer Maternal Uncle     Social History   Socioeconomic History  . Marital status: Married    Spouse name: Not on file  . Number of children: 3  . Years of education: Associates  . Highest education level: Associate degree: academic program  Occupational History  . Occupation: LPN at the [employer]  . Smoking status: Never Smoker  . Smokeless tobacco: Never Used  Vaping Use  . Vaping Use: Never used  Substance and Sexual Activity  . Alcohol use: No    Alcohol/week: 0.0 standard drinks  . Drug use: No  . Sexual activity: Yes    Partners: Male    Birth control/protection: I.U.D.  Other Topics Concern  . Not on file  Social History Narrative   Married Moses in 2016 in [place] and he finally got visa to move to [place] Nov 2019   Social Determinants of Health   Financial Resource Strain: Not on [employer] Insecurity: Not on file  Transportation Needs: Not on file  Physical Activity: Not on file  Stress: Not on file  Social Connections: Not on file   Intimate Partner Violence: Not on file    Review of Systems: See HPI, otherwise negative ROS  Physical Exam: There were no vitals taken for this visit. General:   Alert,  pleasant and cooperative in NAD Head:  Normocephalic and atraumatic. Neck:  Supple; no masses or thyromegaly. Lungs:  Clear throughout to auscultation, normal respiratory effort.    Heart:  +S1, +S2, Regular rate and rhythm, No edema. Abdomen:  Soft, nontender and nondistended. Normal bowel sounds, without guarding, and without rebound.   Neurologic:  Alert and  oriented x4;  grossly normal neurologically.  Impression/Plan: Kathleen Conley is here for a colonoscopy to be performed for average risk screening.  Risks, benefits,  limitations, and alternatives regarding  colonoscopy have been reviewed with the patient.  Questions have been answered.  All parties agreeable.   Pasty Spillers, MD  07/23/2020, 8:25 AM

## 2020-07-23 NOTE — Op Note (Signed)
[facility] Gastroenterology Patient Name: Kathleen Conley Procedure Date: 07/23/2020 9:48 AM MRN: [id number] Account #: [id number] Date of Birth: 1970/02/25 Admit Type: Outpatient Age: 50 Room: [facility] ENDO ROOM 2 Gender: Female Note Status: Finalized Procedure:             Colonoscopy Indications:           Screening for colorectal malignant neoplasm Providers:             Anshika Pethtel B. Maximino Greenland MD, MD Referring MD:          Onnie Boer. Sowles, MD (Referring MD) Medicines:             Monitored Anesthesia Care Complications:         No immediate complications. Procedure:             Pre-Anesthesia Assessment:                        - Prior to the procedure, a History and Physical was                         performed, and patient medications, allergies and                         sensitivities were reviewed. The patient's tolerance                         of previous anesthesia was reviewed.                        - The risks and benefits of the procedure and the                         sedation options and risks were discussed with the                         patient. All questions were answered and informed                         consent was obtained.                        - Patient identification and proposed procedure were                         verified prior to the procedure by the physician, the                         nurse, the anesthetist and the technician. The                         procedure was verified in the pre-procedure area in                         the procedure room in the endoscopy suite.                        - ASA Grade Assessment: II - A patient with mild  systemic disease.                        - After reviewing the risks and benefits, the patient                         was deemed in satisfactory condition to undergo the                         procedure.                        After obtaining informed  consent, the colonoscope was                         passed under direct vision. Throughout the procedure,                         the patient's blood pressure, pulse, and oxygen                         saturations were monitored continuously. The                         Colonoscope was introduced through the anus and                         advanced to the the cecum, identified by appendiceal                         orifice and ileocecal valve. The colonoscopy was                         performed with ease. The patient tolerated the                         procedure well. The quality of the bowel preparation                         was good. Findings:      The perianal and digital rectal examinations were normal.      Multiple diverticula were found in the sigmoid colon.      The exam was otherwise without abnormality.      The rectum, sigmoid colon, descending colon, transverse colon, ascending       colon and cecum appeared normal.      Non-bleeding internal hemorrhoids were found during retroflexion. Impression:            - Diverticulosis in the sigmoid colon.                        - The examination was otherwise normal.                        - The rectum, sigmoid colon, descending colon,                         transverse colon, ascending colon and cecum are normal.                        -  Non-bleeding internal hemorrhoids.                        - No specimens collected. Recommendation:        - Discharge patient to home.                        - Resume previous diet.                        - Continue present medications.                        - Repeat colonoscopy in 10 years for screening                         purposes.                        - Return to primary care physician as previously                         scheduled.                        - The findings and recommendations were discussed with                         the patient.                        - The  findings and recommendations were discussed with                         the patient's family.                        - High fiber diet.                        - In the future, if patient develops new symptoms such                         as blood per rectum, abdominal pain, weight loss,                         altered bowel habits or any other reason for concern,                         patient should discuss this with thier PCP as they may                         need a GI referral at that time or evaluation for need                         for colonoscopy earlier than the recommended screening                         colonoscopy.                        In addition,  if patient's family history of colon                         cancer changes (no family history at this time) in the                         future, earlier screening may be indicated and patient                         should discuss this with PCP as well. Procedure Code(s):     --- Professional ---                        [id number], Colonoscopy, flexible; diagnostic, including                         collection of specimen(s) by brushing or washing, when                         performed (separate procedure) Diagnosis Code(s):     --- Professional ---                        Z12.11, Encounter for screening for malignant neoplasm                         of colon CPT copyright 2019 American Medical Association. All rights reserved. The codes documented in this report are preliminary and upon coder review may  be revised to meet current compliance requirements.  Melodie BouillonVarnita Aydan Phoenix, MD Michel BickersVarnita B. Maximino Greenlandahiliani MD, MD 07/23/2020 10:30:43 AM This report has been signed electronically. Number of Addenda: 0 Note Initiated On: 07/23/2020 9:48 AM Scope Withdrawal Time: 0 hours 18 minutes 17 seconds  Total Procedure Duration: 0 hours 24 minutes 40 seconds       [facility]

## 2020-07-23 NOTE — Anesthesia Preprocedure Evaluation (Signed)
Anesthesia Evaluation  Patient identified by MRN, date of birth, ID band Patient awake    Reviewed: Allergy & Precautions, H&P , NPO status , Patient's Chart, lab work & pertinent test results, reviewed documented beta blocker date and time   Airway Mallampati: III   Neck ROM: full    Dental  (+) Teeth Intact   Pulmonary sleep apnea ,    Pulmonary exam normal        Cardiovascular Exercise Tolerance: Good hypertension, On Medications Normal cardiovascular exam+ Valvular Problems/Murmurs  Rhythm:regular Rate:Normal     Neuro/Psych PSYCHIATRIC DISORDERS Depression  Neuromuscular disease    GI/Hepatic negative GI ROS, Neg liver ROS,   Endo/Other  Morbid obesity  Renal/GU negative Renal ROS  negative genitourinary   Musculoskeletal   Abdominal   Peds  Hematology  (+) Blood dyscrasia, anemia ,   Anesthesia Other Findings Past Medical History: No date: Abnormal neurological finding suggestive of lumbar-level  spinal disorder No date: Allergy No date: Anemia     Comment:  Iron Deficiency No date: Arthritis 08/21/2017: Astigmatism     Comment:  Dr. Marcellus Scott No date: Excell Seltzer cyst 01/18/2010: Breast mass, right No date: Cardiac murmur No date: Chronic constipation No date: Depression No date: Dysmetabolic syndrome No date: Fibromyalgia No date: Hairiness No date: Hydradenitis No date: Hypertension 09/29/2011: Incarcerated ventral hernia 08/21/2017: Low-tension glaucoma of both eyes, mild stage     Comment:  Dr. Marcellus Scott 08/21/2017: Myopia     Comment:  Dr. Marcellus Scott - [facility] No date: Obesity No date: Obstructive sleep apnea     Comment:  no CPAP 08/21/2017: Open-angle glaucoma of both eyes, mild stage     Comment:  Dr. Marcellus Scott - [facility] No date: Plantar fasciitis No date: Pre-diabetes 07/02/219: Presbyopia     Comment:  Dr. Marcellus Scott No date: PVC (premature ventricular  contraction) Past Surgical History: 08/15/2019: ACHILLES TENDON SURGERY; Left     Comment:  Procedure: TRIPLE ARTHRODESIS AND ACHILLES TENDON               LENGTHENING LEFT FOOT;  Surgeon: [name], DPM;                Location: [facility] ORS;  Service: Podiatry;  Laterality:               Left; 12/01/2016: FOOT ARTHRODESIS; Right     Comment:  Procedure: ARTHRODESIS FOOT; TRIPLE;  Surgeon: [name], DPM;  Location: [facility] ORS;  Service: Podiatry;                Laterality: Right; 10/09/11: HERNIA REPAIR     Comment:  Ventral Incarcerated- Dr. [name] 12/09/2009: OOPHORECTOMY; Left 1993: TUBAL LIGATION 10/09/2011: VENTRAL HERNIA REPAIR BMI    Body Mass Index: 42.98 kg/m     Reproductive/Obstetrics negative OB ROS                             Anesthesia Physical Anesthesia Plan  ASA: III  Anesthesia Plan: General   Post-op Pain Management:    Induction:   PONV Risk Score and Plan:   Airway Management Planned:   Additional Equipment:   Intra-op Plan:   Post-operative Plan:   Informed Consent: I have reviewed the patients History and Physical, chart, labs and discussed the procedure including the risks, benefits and alternatives for the proposed anesthesia  with the patient or authorized representative who has indicated his/her understanding and acceptance.     Dental Advisory Given  Plan Discussed with: CRNA  Anesthesia Plan Comments:         Anesthesia Quick Evaluation

## 2020-07-23 NOTE — Transfer of Care (Signed)
Immediate Anesthesia Transfer of Care Note  Patient: Kathleen Conley  Procedure(s) Performed: COLONOSCOPY WITH PROPOFOL (N/A )  Patient Location: PACU  Anesthesia Type:General  Level of Consciousness: drowsy and patient cooperative  Airway & Oxygen Therapy: Patient Spontanous Breathing  Post-op Assessment: Report given to RN and Post -op Vital signs reviewed and stable  Post vital signs: Reviewed and stable  Last Vitals:  Vitals Value Taken Time  BP 142/75 07/23/20 1030  Temp 35.6 C 07/23/20 1030  Pulse 75 07/23/20 1032  Resp 19 07/23/20 1032  SpO2 100 % 07/23/20 1032    Last Pain:  Vitals:   07/23/20 1030  TempSrc: Temporal  PainSc: Asleep         Complications: No complications documented.

## 2020-07-25 NOTE — Anesthesia Postprocedure Evaluation (Signed)
Anesthesia Post Note  Patient: Kathleen Conley  Procedure(s) Performed: COLONOSCOPY WITH PROPOFOL (N/A )  Patient location during evaluation: PACU Anesthesia Type: General Level of consciousness: awake and alert Pain management: pain level controlled Vital Signs Assessment: post-procedure vital signs reviewed and stable Respiratory status: spontaneous breathing, nonlabored ventilation, respiratory function stable and patient connected to nasal cannula oxygen Cardiovascular status: blood pressure returned to baseline and stable Postop Assessment: no apparent nausea or vomiting Anesthetic complications: no   No complications documented.   Last Vitals:  Vitals:   07/23/20 1040 07/23/20 1050  BP: (!) 150/82 (!) 159/90  Pulse: 72 67  Resp: 14 20  Temp:    SpO2: 100% 98%    Last Pain:  Vitals:   07/23/20 1050  TempSrc:   PainSc: 0-No pain                 Yevette Edwards

## 2020-07-26 ENCOUNTER — Encounter: Payer: Self-pay | Admitting: Gastroenterology

## 2020-07-30 ENCOUNTER — Other Ambulatory Visit: Payer: Self-pay | Admitting: Family Medicine

## 2020-07-30 DIAGNOSIS — M797 Fibromyalgia: Secondary | ICD-10-CM

## 2020-07-30 NOTE — Telephone Encounter (Signed)
Pt has an appt on 08/30/20 

## 2020-07-30 NOTE — Telephone Encounter (Signed)
Requested medication (s) are due for refill today: yes  Requested medication (s) are on the active medication list: yes   Last refill:05/24/2020  Future visit scheduled: yes  Notes to clinic: this refill cannot be delegated   Requested Prescriptions  Pending Prescriptions Disp Refills   metaxalone (SKELAXIN) 800 MG tablet [Pharmacy Med Name: METAXALONE 800 MG TABLET] 90 tablet 0    Sig: TAKE ONE TABLET BY MOUTH THREE TIMES A DAY AS NEEDED FOR MUSCLE SPASMS      Not Delegated - Analgesics:  Muscle Relaxants Failed - 07/30/2020  9:34 AM      Failed - This refill cannot be delegated      Passed - Valid encounter within last 6 months    Recent Outpatient Visits           1 month ago Left axillary hidradenitis   [facility] Alba Cory, MD   2 months ago Depression, major, recurrent, mild [facility])   [facility] Alba Cory, MD   4 months ago Suspected COVID-19 virus infection   [facility] [place], Danna Hefty, MD   9 months ago Depression, major, recurrent, mild [facility])   [facility] Alba Cory, MD   1 year ago Obstructive apnea   [facility] Alba Cory, MD       Future Appointments             In 1 month Alba Cory, MD [facility]   In 2 months Alba Cory, MD [facility]

## 2020-08-17 ENCOUNTER — Telehealth: Payer: Self-pay | Admitting: Family Medicine

## 2020-08-17 ENCOUNTER — Encounter: Payer: Self-pay | Admitting: Family Medicine

## 2020-08-17 NOTE — Telephone Encounter (Signed)
Called pt back she states still had bottle of benicar left so finished that because she did not want to waste.  Just started the benicar HCTZ yesterday experienced numbness and leg heaviness and read that is a side effect of this medication.  She also states has took HCTZ in past and had bad side effects.  She has not checked bp has no way of doing.

## 2020-08-17 NOTE — Telephone Encounter (Signed)
Pt has an appt on 08/30/20 

## 2020-08-17 NOTE — Telephone Encounter (Signed)
Pt called and refuses to take the benicar with HCTZ in it.

## 2020-08-17 NOTE — Telephone Encounter (Addendum)
Patient states she would like to only take BENICAR 40MG  without the combination HCT. Patient states she took her first does of Benicar with the HCT and it makes her feel bad (patient states its hard to explain) .   [facility] [id number], [place] - [place] [address] Phone:  [phone]  Fax:  [phone]

## 2020-08-26 NOTE — Progress Notes (Signed)
Name: Kathleen Conley   MRN: [id number]    DOB: 03-02-1970   Date:08/30/2020       Progress Note  Subjective  Chief Complaint  Follow Up  HPI  Major depression: she also has seasonal affective disorder, she was doing well this past Spring and we stopped wellbutrin ( bp was high) and increased dose of Duloxetine, she states she has not been doing well since off Wellbutrin, feeling sad and would like to resume previous regiment    Metabolic Syndrome and Obesity: we tried giving her Trulicity but needs step therapy, she did not like  Metformin - caused diarrhea . She denies polyphagia, polyuria or polydipsia. She is only on life style modification and last A1C was 5.9 %, she lost a few pounds since last visit, continue life style modification    Obesity: she has a long history of obesity, started after her divorce when she was in her 48's. She tried weight watchers but was unsuccessful, we discussed intermittent fasting , she states she is a stress eater. Discussed Bernie Covey - but not covered by insurance, discussed contrave in the past but she states needs to resume Wellbutrin for depression since higher dose of duloxetine is not working for her. She has  lost a few pounds since last visit    FMS: she states pain is better with Cymbalta, could not tolerate Lyrica ( caused nightmares and vivid dreams) also stopped Gabapentin ( she was grinding her teeth), she states pain has been controlled, taking skelaxin about once a day  She has intermittent back pain, that is intense and brief, usually feels like catch    OSA: she has mild symptoms, did not qualify for CPAP , avoiding sleeping on her back,  trying to sleep  on her side, she is not waking up with headaches. She also has shift work sleep disorder and states nuvigil helps her stay awake, she works third shift. Unchanged    HTN: bp is elevated today, she  agreed on changing from Benicar 40 mg to Benicar 40/12.5 mg daily, however she developed  tingling/burning on her feet and went back to Benicar without HCTZ, bp is at goal, continue current medication   Insomnia: she is doing well at this time, except on weekends, she works M-F 11 pm to 7 am, takes [employer] prior to work , she needs a refill    Leucocytosis and anemia: she has seen hematologist in the past and does not want to go back at this time. She has hidradenitis suppurative . 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   Flat foot 01/28/2016   Aortic sclerosis 10/14/2015   Midline low back pain without sciatica 09/20/2015   Leukocytosis 11/29/2014   Osteoarthritis of left knee 11/20/2014   History of ventral hernia repair 10/27/2014   Allergic rhinitis 08/17/2014   Axillary hidradenitis suppurativa 08/17/2014   Baker cyst 08/17/2014   Chronic constipation 08/17/2014   Depression, major, recurrent, mild (HCC) 08/17/2014   Engages in travel abroad 08/17/2014   Fibromyalgia 08/17/2014   Cardiac murmur 08/17/2014   Dysmetabolic syndrome 08/17/2014   Plantar fasciitis 08/17/2014   Beat, premature ventricular 08/17/2014   Abnormal neurological finding suggestive of lumbar-level spinal disorder 08/17/2014   Obstructive apnea 12/02/2013   Essential (primary) hypertension 12/09/2009  Hypertrichosis 12/09/2009    Past Surgical History:  Procedure Laterality Date   ACHILLES TENDON SURGERY Left 08/15/2019   Procedure: TRIPLE ARTHRODESIS AND ACHILLES TENDON LENGTHENING LEFT FOOT;  Surgeon: [name], DPM;  Location: [facility];  Service: Podiatry;  Laterality: Left;   COLONOSCOPY WITH PROPOFOL N/A 07/23/2020   Procedure: COLONOSCOPY WITH PROPOFOL;  Surgeon: Pasty Spillers, MD;  Location: [facility];  Service: Endoscopy;  Laterality: N/A;   FOOT ARTHRODESIS Right 12/01/2016    Procedure: ARTHRODESIS FOOT; TRIPLE;  Surgeon: [name], DPM;  Location: [facility];  Service: Podiatry;  Laterality: Right;   HERNIA REPAIR  10/09/11   Ventral Incarcerated- Dr. [name]   OOPHORECTOMY Left 12/09/2009   TUBAL LIGATION  1993   VENTRAL HERNIA REPAIR  10/09/2011    Family History  Problem Relation Age of Onset   Hypertension Mother    CVA Mother    Kidney disease Mother    Congenital heart disease Mother    Heart disease Father    Hypertension Father    Diabetes Father    Breast cancer Paternal Grandmother        Bilateral   Colon cancer Maternal Grandfather    Colon cancer Maternal Uncle     Social History   Tobacco Use   Smoking status: Never   Smokeless tobacco: Never  Substance Use Topics   Alcohol use: No    Alcohol/week: 0.0 standard drinks     Current Outpatient Medications:    acetaminophen (TYLENOL) 500 MG tablet, Take 1 tablet (500 mg total) by mouth every 6 (six) hours as needed. Max of 3 grams daily or 6 pills dialy (Patient taking differently: Take 1,000 mg by mouth every 6 (six) hours as needed for moderate pain or headache.), Disp: 30 tablet, Rfl: 0   aspirin EC 81 MG tablet, Take 1 tablet (81 mg total) by mouth daily. (Patient taking differently: Take 81 mg by mouth every evening.), Disp: 30 tablet, Rfl: 0   buPROPion (WELLBUTRIN XL) 150 MG 24 hr tablet, Take 1 tablet (150 mg total) by mouth daily., Disp: 90 tablet, Rfl: 0   cetirizine (ZYRTEC) 10 MG tablet, Take 10 mg by mouth every evening., Disp: , Rfl:    Cholecalciferol (VITAMIN D) 2000 UNITS tablet, Take 2,000 Units by mouth every evening. , Disp: , Rfl:    docusate sodium (COLACE) 100 MG capsule, Take 100 mg by mouth daily as needed for moderate constipation. , Disp: , Rfl:    ferrous sulfate 325 (65 FE) MG tablet, Take 325 mg by mouth every evening. , Disp: , Rfl:    fluticasone (FLONASE) 50 MCG/ACT nasal spray, Place 2 sprays into both nostrils daily as needed for allergies., Disp: 16 g,  Rfl: 1   ibuprofen (ADVIL) 200 MG tablet, Take 200-600 mg by mouth every 6 (six) hours as needed (for pain.)., Disp: , Rfl:    levonorgestrel (MIRENA) 20 MCG/24HR IUD, 1 each by Intrauterine route once. , Disp: , Rfl:    Multiple Vitamin (MULTIVITAMIN WITH MINERALS) TABS tablet, Take 1 tablet by mouth every evening., Disp: , Rfl:    nystatin (MYCOSTATIN/NYSTOP) powder, Apply 1 application topically 2 (two) times daily as needed., Disp: 60 g, Rfl: 0   olmesartan (BENICAR) 40 MG tablet, Take 1 tablet (40 mg total) by mouth daily., Disp: 90 tablet, Rfl: 0   TRAVATAN Z 0.004 % SOLN ophthalmic solution, Place 1 drop into both eyes at bedtime., Disp: , Rfl: 3   Armodafinil (NUVIGIL) 150 MG  tablet, Take 1 tablet (150 mg total) by mouth daily as needed (shift work--sleepiness)., Disp: 90 tablet, Rfl: 0   DULoxetine (CYMBALTA) 60 MG capsule, Take 1 capsule (60 mg total) by mouth every evening., Disp: 90 capsule, Rfl: 0   metaxalone (SKELAXIN) 800 MG tablet, Take 1 tablet (800 mg total) by mouth daily as needed for muscle spasms., Disp: 90 tablet, Rfl: 0  Allergies  Allergen Reactions   Lisinopril Cough        Norvasc [Amlodipine Besylate]    Hctz [Hydrochlorothiazide] Other (See Comments)    Burning sensation on her feet     I personally reviewed active problem list, medication list, allergies, family history, social history, health maintenance with the patient/caregiver today.   ROS  Constitutional: Negative for fever or weight change.  Respiratory: Negative for cough and shortness of breath.   Cardiovascular: Negative for chest pain or palpitations.  Gastrointestinal: Negative for abdominal pain, no bowel changes.  Musculoskeletal: positive for gait problem ( right foot leans to the right and difficulty climbing stairs) but no joint swelling.  Skin: Negative for rash.  Neurological: Negative for dizziness or headache.  No other specific complaints in a complete review of systems (except as  listed in HPI above).   Objective  Vitals:   08/30/20 1152  BP: 126/84  Pulse: 92  Resp: 16  Temp: 98.1 F (36.7 C)  SpO2: 96%  Weight: 231 lb (104.8 kg)  Height: 5\' 2"  (1.575 m)    Body mass index is 42.25 kg/m.  Physical Exam  Constitutional: Patient appears well-developed and well-nourished. Obese  No distress.  HEENT: head atraumatic, normocephalic, pupils equal and reactive to light, neck supple Cardiovascular: Normal rate, regular rhythm and normal heart sounds.  No murmur heard. No BLE edema. Pulmonary/Chest: Effort normal and breath sounds normal. No respiratory distress. Abdominal: Soft.  There is no tenderness. Psychiatric: Patient has a normal mood and affect. behavior is normal. Judgment and thought content normal.  Recent Results (from the past 2160 hour(s))  Anaerobic and Aerobic Culture     Status: None   Collection Time: 06/03/20 10:23 AM  Result Value Ref Range   MICRO NUMBER: 06/05/20    SPECIMEN QUALITY: Adequate    Source: WOUND (SITE NOT SPECIFIED)    STATUS: FINAL    GRAM STAIN:      No epithelial cells seen No organisms or white blood cells seen   ANA RESULT:      No anaerobes isolated. No source was provided. The specimen was tested and reported based upon the test code ordered. If this is incorrect, please contact client services.   MICRO NUMBER: [id number]    SPECIMEN QUALITY: Adequate    SOURCE: WOUND (SITE NOT SPECIFIED)    STATUS: FINAL    AER RESULT:      Growth of skin flora (note: Growth does not include S. aureus, beta-hemolytic Streptococci or P. aeruginosa). No source was provided. The specimen was tested and reported based upon the test code ordered. If this is incorrect, please contact client  services.      PHQ2/9: Depression screen [facility] 2/9 08/30/2020 06/03/2020 05/24/2020 03/15/2020 04/15/2019  Decreased Interest 3 0 0 0 1  Down, Depressed, Hopeless 3 0 0 0 0  PHQ - 2 Score 6 0 0 0 1  Altered sleeping 0 0 0 - 0  Tired, decreased  energy 0 1 1 - 1  Change in appetite 0 0 0 - 1  Feeling bad or failure about  yourself  3 0 0 - 0  Trouble concentrating 0 0 0 - 0  Moving slowly or fidgety/restless 0 0 0 - 0  Suicidal thoughts 0 0 0 - 0  PHQ-9 Score 9 1 1  - 3  Difficult doing work/chores - - - - Not difficult at all  Some recent data might be hidden    phq 9 is positive   Fall Risk: Fall Risk  08/30/2020 06/03/2020 05/24/2020 03/15/2020 04/15/2019  Falls in the past year? 0 0 0 0 0  Number falls in past yr: 0 0 0 0 0  Injury with Fall? 0 - 0 0 0  Follow up - Falls prevention discussed - - -     Functional Status Survey: Is the patient deaf or have difficulty hearing?: No Does the patient have difficulty seeing, even when wearing glasses/contacts?: No Does the patient have difficulty concentrating, remembering, or making decisions?: No Does the patient have difficulty walking or climbing stairs?: Yes Does the patient have difficulty dressing or bathing?: No Does the patient have difficulty doing errands alone such as visiting a doctor's office or shopping?: No    Assessment & Plan  1. Depression, major, recurrent, mild (HCC)  - DULoxetine (CYMBALTA) 60 MG capsule; Take 1 capsule (60 mg total) by mouth every evening.  Dispense: 90 capsule; Refill: 0 - buPROPion (WELLBUTRIN XL) 150 MG 24 hr tablet; Take 1 tablet (150 mg total) by mouth daily.  Dispense: 90 tablet; Refill: 0  2. Fibromyalgia  - DULoxetine (CYMBALTA) 60 MG capsule; Take 1 capsule (60 mg total) by mouth every evening.  Dispense: 90 capsule; Refill: 0 - Armodafinil (NUVIGIL) 150 MG tablet; Take 1 tablet (150 mg total) by mouth daily as needed (shift work--sleepiness).  Dispense: 90 tablet; Refill: 0 - metaxalone (SKELAXIN) 800 MG tablet; Take 1 tablet (800 mg total) by mouth daily as needed for muscle spasms.  Dispense: 90 tablet; Refill: 0  3. Essential (primary) hypertension  Continue Benicar 40 mg, she stopped Benicar hctz, bp is at goal   4.  Obstructive apnea  - Armodafinil (NUVIGIL) 150 MG tablet; Take 1 tablet (150 mg total) by mouth daily as needed (shift work--sleepiness).  Dispense: 90 tablet; Refill: 0  5. Shift work sleep disorder  - Armodafinil (NUVIGIL) 150 MG tablet; Take 1 tablet (150 mg total) by mouth daily as needed (shift work--sleepiness).  Dispense: 90 tablet; Refill: 0  6. Hyperglycemia   7. Dyslipidemia   8. Morbid obesity (HCC)  Discussed with the patient the risk posed by an increased BMI. Discussed importance of portion control, calorie counting and at least 150 minutes of physical activity weekly. Avoid sweet beverages and drink more water. Eat at least 6 servings of fruit and vegetables daily

## 2020-08-30 ENCOUNTER — Encounter: Payer: Self-pay | Admitting: Family Medicine

## 2020-08-30 ENCOUNTER — Other Ambulatory Visit: Payer: Self-pay

## 2020-08-30 ENCOUNTER — Ambulatory Visit: Payer: 59 | Admitting: Family Medicine

## 2020-08-30 VITALS — BP 126/84 | HR 92 | Temp 98.1°F | Resp 16 | Ht 62.0 in | Wt 231.0 lb

## 2020-08-30 DIAGNOSIS — I10 Essential (primary) hypertension: Secondary | ICD-10-CM

## 2020-08-30 DIAGNOSIS — G4733 Obstructive sleep apnea (adult) (pediatric): Secondary | ICD-10-CM

## 2020-08-30 DIAGNOSIS — M797 Fibromyalgia: Secondary | ICD-10-CM

## 2020-08-30 DIAGNOSIS — F330 Major depressive disorder, recurrent, mild: Secondary | ICD-10-CM

## 2020-08-30 DIAGNOSIS — R739 Hyperglycemia, unspecified: Secondary | ICD-10-CM | POA: Diagnosis not present

## 2020-08-30 DIAGNOSIS — E785 Hyperlipidemia, unspecified: Secondary | ICD-10-CM

## 2020-08-30 DIAGNOSIS — G4726 Circadian rhythm sleep disorder, shift work type: Secondary | ICD-10-CM

## 2020-08-30 MED ORDER — DULOXETINE HCL 60 MG PO CPEP: 60.0000 mg | ORAL_CAPSULE | Freq: Every evening | ORAL | 0 refills | Status: DC

## 2020-08-30 MED ORDER — METAXALONE 800 MG PO TABS: 800.0000 mg | ORAL_TABLET | Freq: Every day | ORAL | 0 refills | Status: DC | PRN

## 2020-08-30 MED ORDER — OLMESARTAN MEDOXOMIL 40 MG PO TABS: 40.0000 mg | ORAL_TABLET | Freq: Every day | ORAL | 0 refills | Status: DC

## 2020-08-30 MED ORDER — BUPROPION HCL ER (XL) 150 MG PO TB24: 150.0000 mg | ORAL_TABLET | Freq: Every day | ORAL | 0 refills | Status: DC

## 2020-08-30 MED ORDER — ARMODAFINIL 150 MG PO TABS: 150.0000 mg | ORAL_TABLET | Freq: Every day | ORAL | 0 refills | Status: DC | PRN

## 2020-09-21 ENCOUNTER — Telehealth: Payer: Self-pay

## 2020-09-21 ENCOUNTER — Emergency Department: Payer: 59

## 2020-09-21 ENCOUNTER — Other Ambulatory Visit: Payer: Self-pay

## 2020-09-21 DIAGNOSIS — Z7982 Long term (current) use of aspirin: Secondary | ICD-10-CM | POA: Insufficient documentation

## 2020-09-21 DIAGNOSIS — K5904 Chronic idiopathic constipation: Secondary | ICD-10-CM | POA: Diagnosis not present

## 2020-09-21 DIAGNOSIS — R109 Unspecified abdominal pain: Secondary | ICD-10-CM | POA: Diagnosis present

## 2020-09-21 DIAGNOSIS — I10 Essential (primary) hypertension: Secondary | ICD-10-CM | POA: Diagnosis not present

## 2020-09-21 DIAGNOSIS — Z79899 Other long term (current) drug therapy: Secondary | ICD-10-CM | POA: Diagnosis not present

## 2020-09-21 LAB — COMPREHENSIVE METABOLIC PANEL
ALT: 16 U/L (ref 0–44)
AST: 17 U/L (ref 15–41)
Albumin: 3.9 g/dL (ref 3.5–5.0)
Alkaline Phosphatase: 73 U/L (ref 38–126)
Anion gap: 6 (ref 5–15)
BUN: 19 mg/dL (ref 6–20)
CO2: 24 mmol/L (ref 22–32)
Calcium: 8.4 mg/dL — ABNORMAL LOW (ref 8.9–10.3)
Chloride: 110 mmol/L (ref 98–111)
Creatinine, Ser: 0.69 mg/dL (ref 0.44–1.00)
GFR, Estimated: >60 mL/min (ref >60)
Glucose, Bld: 108 mg/dL — ABNORMAL HIGH (ref 70–99)
Potassium: 3.9 mmol/L (ref 3.5–5.1)
Sodium: 140 mmol/L (ref 135–145)
Total Bilirubin: 0.4 mg/dL (ref 0.3–1.2)
Total Protein: 7.7 g/dL (ref 6.5–8.1)

## 2020-09-21 LAB — URINALYSIS, COMPLETE (UACMP) WITH MICROSCOPIC
Bacteria, UA: NONE SEEN
Bilirubin Urine: NEGATIVE
Glucose, UA: NEGATIVE mg/dL
Ketones, ur: NEGATIVE mg/dL
Nitrite: NEGATIVE
Protein, ur: 100 mg/dL — AB
Specific Gravity, Urine: 1.019 (ref 1.005–1.030)
pH: 6 (ref 5.0–8.0)

## 2020-09-21 LAB — CBC
HCT: 39.5 % (ref 36.0–46.0)
Hemoglobin: 12.1 g/dL (ref 12.0–15.0)
MCH: 26.9 pg (ref 26.0–34.0)
MCHC: 30.6 g/dL (ref 30.0–36.0)
MCV: 88 fL (ref 80.0–100.0)
Platelets: 358 10*3/uL (ref 150–400)
RBC: 4.49 MIL/uL (ref 3.87–5.11)
RDW: 16.5 % — ABNORMAL HIGH (ref 11.5–15.5)
WBC: 14.3 10*3/uL — ABNORMAL HIGH (ref 4.0–10.5)
nRBC: 0 % (ref 0.0–0.2)

## 2020-09-21 LAB — LIPASE, BLOOD: Lipase: 30 U/L (ref 11–51)

## 2020-09-21 LAB — PREGNANCY, URINE: Preg Test, Ur: NEGATIVE

## 2020-09-21 NOTE — Telephone Encounter (Signed)
Pt reports that she has been experiencing nausea and constipation since she had colonoscopy 07/23/20.Marland KitchenMarland KitchenMarland Kitchen

## 2020-09-21 NOTE — ED Triage Notes (Signed)
Pt states she had colonoscopy back in early June and has had problems with her stomach ever since. Pt states the pain, constipation and bloating is worse today. Pt has tried laxatives at home with no relief.

## 2020-09-22 ENCOUNTER — Emergency Department
Admission: EM | Admit: 2020-09-22 | Discharge: 2020-09-22 | Disposition: A | Discharge status: Home / Self Care | Payer: 59 | Attending: Emergency Medicine | Admitting: Emergency Medicine

## 2020-09-22 ENCOUNTER — Telehealth: Payer: Self-pay | Admitting: Family Medicine

## 2020-09-22 ENCOUNTER — Emergency Department: Payer: 59

## 2020-09-22 ENCOUNTER — Encounter: Payer: Self-pay | Admitting: Family Medicine

## 2020-09-22 DIAGNOSIS — K5904 Chronic idiopathic constipation: Secondary | ICD-10-CM

## 2020-09-22 DIAGNOSIS — R109 Unspecified abdominal pain: Secondary | ICD-10-CM

## 2020-09-22 LAB — LACTIC ACID, PLASMA: Lactic Acid, Venous: 0.9 mmol/L (ref 0.5–1.9)

## 2020-09-22 MED ORDER — DULCOLAX 5 MG PO TBEC: 5.0000 mg | DELAYED_RELEASE_TABLET | Freq: Every day | ORAL | 1 refills | Status: AC | PRN

## 2020-09-22 NOTE — ED Provider Notes (Signed)
[facility] Emergency Department Provider Note  ____________________________________________  Time seen: Approximately 2:08 AM  I have reviewed the triage vital signs and the nursing notes.   HISTORY  Chief Complaint Abdominal Pain   HPI Kathleen Conley is a 50 y.o. female with a history of chronic constipation, obesity, hypertension, fibromyalgia, anemia on iron supplementation who presents for evaluation of abdominal pain and constipation.  Patient reports having a colonoscopy 2 months ago.  Since then she has been more constipated than normal but still having bowel movements.  She feels bloated.  She is passing gas.  She has a history of chronic constipation and takes fiber pills for it which she continues to take it.  Last bowel movement was yesterday.  She also noticed that over the last 7 days she has been having postprandial epigastric abdominal pain that she describes as sharp usually lasting about an hour after eating or drinking anything and subsiding without any intervention.  She has nausea associated with it but no vomiting.  No chest pain or shortness of breath, no dysuria or hematuria, no flank pain.  She has no pain at this time.  She tried to call her GI doctor but did not receive a call back therefore she presented to the emergency room   Past Medical History:  Diagnosis Date   Abnormal neurological finding suggestive of lumbar-level spinal disorder    Allergy    Anemia    Iron Deficiency   Arthritis    Astigmatism 08/21/2017   Dr. 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 Marcellus Scott   Myopia 08/21/2017   Dr. Marcellus Scott - [facility]   Obesity    Obstructive sleep  apnea    no CPAP   Open-angle glaucoma of both eyes, mild stage 08/21/2017   Dr. Marcellus Scott - [facility]   Plantar fasciitis    Pre-diabetes    Presbyopia 07/02/219   Dr. Marcellus Scott   PVC (premature ventricular contraction)     Patient Active Problem List   Diagnosis Date Noted   Encounter for screening colonoscopy    Knee pain 03/15/2020   Morbid obesity (HCC) 04/17/2018   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   Flat foot 01/28/2016   Aortic sclerosis 10/14/2015   Midline low back pain without sciatica 09/20/2015   Leukocytosis 11/29/2014   Osteoarthritis of left knee 11/20/2014   History of ventral hernia repair 10/27/2014   Allergic rhinitis 08/17/2014   Axillary hidradenitis suppurativa 08/17/2014   Baker cyst 08/17/2014   Chronic constipation 08/17/2014   Depression, major, recurrent, mild (HCC) 08/17/2014   Engages in travel abroad 08/17/2014   Fibromyalgia 08/17/2014   Cardiac murmur 08/17/2014   Dysmetabolic syndrome 08/17/2014   Plantar fasciitis 08/17/2014   Beat, premature ventricular 08/17/2014   Abnormal neurological finding suggestive of lumbar-level spinal disorder 08/17/2014   Obstructive apnea 12/02/2013   Essential (primary) hypertension 12/09/2009   Hypertrichosis 12/09/2009    Past Surgical History:  Procedure Laterality Date   ACHILLES TENDON SURGERY Left 08/15/2019   Procedure: TRIPLE ARTHRODESIS AND ACHILLES TENDON LENGTHENING LEFT FOOT;  Surgeon: [name], DPM;  Location: [facility] ORS;  Service: Podiatry;  Laterality: Left;   COLONOSCOPY WITH PROPOFOL N/A 07/23/2020   Procedure: COLONOSCOPY WITH PROPOFOL;  Surgeon: Pasty Spillers, MD;  Location: [facility] ENDOSCOPY;  Service: Endoscopy;  Laterality: N/A;   FOOT ARTHRODESIS Right 12/01/2016   Procedure: ARTHRODESIS FOOT; TRIPLE;  Surgeon: [name], DPM;  Location: [facility] ORS;  Service: Podiatry;  Laterality: Right;   HERNIA REPAIR  10/09/11   Ventral Incarcerated- Dr. [name]    OOPHORECTOMY Left 12/09/2009   TUBAL LIGATION  1993   VENTRAL HERNIA REPAIR  10/09/2011    Prior to Admission medications   Medication Sig Start Date End Date Taking? Authorizing Provider  bisacodyl (DULCOLAX) 5 MG EC tablet Take 1 tablet (5 mg total) by mouth daily as needed for moderate constipation. 09/22/20 09/22/21 Yes Rema Lievanos, [place], MD  acetaminophen (TYLENOL) 500 MG tablet Take 1 tablet (500 mg total) by mouth every 6 (six) hours as needed. Max of 3 grams daily or 6 pills dialy Patient taking differently: Take 1,000 mg by mouth every 6 (six) hours as needed for moderate pain or headache. 06/18/15   Alba Cory, MD  Armodafinil (NUVIGIL) 150 MG tablet Take 1 tablet (150 mg total) by mouth daily as needed (shift work--sleepiness). 08/30/20   Alba Cory, MD  aspirin EC 81 MG tablet Take 1 tablet (81 mg total) by mouth daily. Patient taking differently: Take 81 mg by mouth every evening. 01/28/16   Alba Cory, MD  buPROPion (WELLBUTRIN XL) 150 MG 24 hr tablet Take 1 tablet (150 mg total) by mouth daily. 08/30/20   Alba Cory, MD  cetirizine (ZYRTEC) 10 MG tablet Take 10 mg by mouth every evening.    [provider]  Cholecalciferol (VITAMIN D) 2000 UNITS tablet Take 2,000 Units by mouth every evening.     [provider]  docusate sodium (COLACE) 100 MG capsule Take 100 mg by mouth daily as needed for moderate constipation.     [provider]  DULoxetine (CYMBALTA) 60 MG capsule Take 1 capsule (60 mg total) by mouth every evening. 08/30/20   Alba Cory, MD  ferrous sulfate 325 (65 FE) MG tablet Take 325 mg by mouth every evening.     [provider]  fluticasone (FLONASE) 50 MCG/ACT nasal spray Place 2 sprays into both nostrils daily as needed for allergies. 10/18/18   Alba Cory, MD  ibuprofen (ADVIL) 200 MG tablet Take 200-600 mg by mouth every 6 (six) hours as needed (for pain.).    [provider]  levonorgestrel  (MIRENA) 20 MCG/24HR IUD 1 each by Intrauterine route once.     [provider]  metaxalone (SKELAXIN) 800 MG tablet Take 1 tablet (800 mg total) by mouth daily as needed for muscle spasms. 08/30/20   Alba Cory, MD  Multiple Vitamin (MULTIVITAMIN WITH MINERALS) TABS tablet Take 1 tablet by mouth every evening.    [provider]  nystatin (MYCOSTATIN/NYSTOP) powder Apply 1 application topically 2 (two) times daily as needed. 07/31/19   Vena Austria, MD  olmesartan (BENICAR) 40 MG tablet Take 1 tablet (40 mg total) by mouth daily. 08/30/20   Alba Cory, MD  TRAVATAN Z 0.004 % SOLN ophthalmic solution Place 1 drop into both eyes at bedtime. 08/21/17   Pa, [facility]    Allergies Lisinopril, Norvasc [amlodipine besylate], and Hctz [hydrochlorothiazide]  Family History  Problem Relation Age of Onset   Hypertension Mother    CVA Mother    Kidney disease Mother  Congenital heart disease Mother    Heart disease Father    Hypertension Father    Diabetes Father    Breast cancer Paternal Grandmother        Bilateral   Colon cancer Maternal Grandfather    Colon cancer Maternal Uncle     Social History Social History   Tobacco Use   Smoking status: Never   Smokeless tobacco: Never  Vaping Use   Vaping Use: Never used  Substance Use Topics   Alcohol use: No    Alcohol/week: 0.0 standard drinks   Drug use: No    Review of Systems  Constitutional: Negative for fever. Eyes: Negative for visual changes. ENT: Negative for sore throat. Neck: No neck pain  Cardiovascular: Negative for chest pain. Respiratory: Negative for shortness of breath. Gastrointestinal: + epigastric abdominal pain, bloating, nausea. No vomiting or diarrhea. Genitourinary: Negative for dysuria. Musculoskeletal: Negative for back pain. Skin: Negative for rash. Neurological: Negative for headaches, weakness or numbness. Psych: No SI or  HI  ____________________________________________   PHYSICAL EXAM:  VITAL SIGNS: Vitals:   09/22/20 0330 09/22/20 0400  BP: (!) 150/85 (!) 164/91  Pulse: 84 83  Resp: 17 15  Temp:    SpO2: 96% 94%     Constitutional: Alert and oriented. Well appearing and in no apparent distress. HEENT:      Head: Normocephalic and atraumatic.         Eyes: Conjunctivae are normal. Sclera is non-icteric.       Mouth/Throat: Mucous membranes are moist.       Neck: Supple with no signs of meningismus. Cardiovascular: Regular rate and rhythm. No murmurs, gallops, or rubs. 2+ symmetrical distal pulses are present in all extremities. No JVD. Respiratory: Normal respiratory effort. Lungs are clear to auscultation bilaterally.  Gastrointestinal: Soft, mildly tender to palpation on the right upper quadrant and epigastric region, and non distended with positive bowel sounds. No rebound or guarding. Genitourinary: No CVA tenderness. Musculoskeletal:  No edema, cyanosis, or erythema of extremities. Neurologic: Normal speech and language. Face is symmetric. Moving all extremities. No gross focal neurologic deficits are appreciated. Skin: Skin is warm, dry and intact. No rash noted. Psychiatric: Mood and affect are normal. Speech and behavior are normal.  ____________________________________________   LABS (all labs ordered are listed, but only abnormal results are displayed)  Labs Reviewed  COMPREHENSIVE METABOLIC PANEL - Abnormal; Notable for the following components:      Result Value   Glucose, Bld 108 (*)    Calcium 8.4 (*)    All other components within normal limits  CBC - Abnormal; Notable for the following components:   WBC 14.3 (*)    RDW 16.5 (*)    All other components within normal limits  URINALYSIS, COMPLETE (UACMP) WITH MICROSCOPIC - Abnormal; Notable for the following components:   Color, Urine YELLOW (*)    APPearance CLOUDY (*)    Hgb urine dipstick MODERATE (*)    Protein, ur  100 (*)    Leukocytes,Ua MODERATE (*)    All other components within normal limits  LIPASE, BLOOD  PREGNANCY, URINE  LACTIC ACID, PLASMA   ____________________________________________  EKG  none  ____________________________________________  RADIOLOGY  I have personally reviewed the images performed during this visit and I agree with the Radiologist's read.   Interpretation by Radiologist:  DG Abdomen 1 View  Result Date: 09/21/2020 CLINICAL DATA:  Abdominal pain EXAM: ABDOMEN - 1 VIEW COMPARISON:  None. FINDINGS: The bowel gas pattern is  normal. No radio-opaque calculi or other significant radiographic abnormality are seen. T-shaped contraceptive device projecting in the central pelvis. IMPRESSION: Negative. Electronically Signed   By: Deatra Robinson M.D.   On: 09/21/2020 21:50   US Abdomen Limited RUQ (LIVER/GB)  Result Date: 09/22/2020 CLINICAL DATA:  Postprandial abdominal pain x1 week. EXAM: ULTRASOUND ABDOMEN LIMITED RIGHT UPPER QUADRANT COMPARISON:  None. FINDINGS: Gallbladder: No gallstones or wall thickening visualized (1.8 mm). No sonographic Murphy sign noted by sonographer. Common bile duct: Diameter: 5.1 mm Liver: No focal lesion identified. Diffusely increased echogenicity of the liver parenchyma is noted. Portal vein is patent on color Doppler imaging with normal direction of blood flow towards the liver. Other: None. IMPRESSION: Fatty liver. Electronically Signed   By: Aram Candela M.D.   On: 09/22/2020 03:22     ____________________________________________   PROCEDURES  Procedure(s) performed: None Procedures Critical Care performed:  None ____________________________________________   INITIAL IMPRESSION / ASSESSMENT AND PLAN / ED COURSE  50 y.o. female with a history of chronic constipation, obesity, hypertension, fibromyalgia, anemia on iron supplementation who presents for evaluation of postprandial epigastric abdominal pain, bloating, nausea, and  constipation.  Patient is well-appearing in no distress, elevated BP but other vital signs are within normal limits, abdomen is soft and nondistended with mild epigastric and right upper quadrant tenderness, negative Murphy sign.  She has positive bowel sounds.  Differential diagnosis for patient's postprandial epigastric abdominal pain and nausea include gastritis versus peptic ulcer disease versus versus GERD/indigestion versus biliary colic versus pancreatitis versus mesenteric ischemia. Will get RUQ [place], lactic acid.  Labs showing leukocytosis with a white count of 14.3, normal electrolytes, normal kidney function, normal LFTs and lipase.  UA with no signs of infection.  Ddx for constipation: Patient with history of chronic constipation.  Takes iron supplementation.  She feels that she is worse since the colonoscopy.  Currently on fiber Gummies.  We will prescribe Dulcolax.  KUB showing no signs of obstruction or severe constipation. Recommended follow-up with her GI doctor in case patient needs to be on prescription medication like Linzess for constipation.    _________________________ 4:25 AM on 09/22/2020 ----------------------------------------- KUB with mild to moderate stool burden but no signs of obstruction.  Will prescribe Dulcolax.  Right upper quadrant ultrasound negative.  Lactic is negative therefore with a benign abdominal exam with no tenderness, no pain at this time I have low suspicion for mesenteric ischemia.  Will recommend follow-up with her GI doctor for an endoscopy to rule out peptic ulcer disease versus gastritis as possible the cause of her postprandial pain.  In the meantime recommended a more bland diet and return to the emergency room if pain changes or becomes worse.    _____________________________________________ Please note:  Patient was evaluated in Emergency Department today for the symptoms described in the history of present illness. Patient was evaluated in the  context of the global COVID-19 pandemic, which necessitated consideration that the patient might be at risk for infection with the SARS-CoV-2 virus that causes COVID-19. Institutional protocols and algorithms that pertain to the evaluation of patients at risk for COVID-19 are in a state of rapid change based on information released by regulatory bodies including the CDC and federal and state organizations. These policies and algorithms were followed during the patient's care in the ED.  Some ED evaluations and interventions may be delayed as a result of limited staffing during the pandemic.   [place] Controlled Substance Database was reviewed by me. ____________________________________________  FINAL CLINICAL IMPRESSION(S) / ED DIAGNOSES   Final diagnoses:  Abdominal pain  Chronic idiopathic constipation      NEW MEDICATIONS STARTED DURING THIS VISIT:  ED Discharge Orders          Ordered    bisacodyl (DULCOLAX) 5 MG EC tablet  Daily PRN        09/22/20 0424             Note:  This document was prepared using Dragon voice recognition software and may include unintentional dictation errors.    Nita Sickle, MD 09/22/20 [phone]

## 2020-09-22 NOTE — Telephone Encounter (Signed)
Pt called to follow up on her message this morning. She wanted to know if she has a UTI from her ER results and if she needs medication because she was not prescribed any at the ER/ please advise

## 2020-09-22 NOTE — Discharge Instructions (Addendum)

## 2020-09-22 NOTE — ED Notes (Signed)
US at the bedside

## 2020-09-22 NOTE — ED Notes (Signed)
MD at the bedside  

## 2020-09-22 NOTE — Telephone Encounter (Signed)
I went ahead and scheduled pt for tomorrow at 1pm as she went to ED.Marland KitchenMarland Kitchen

## 2020-09-23 ENCOUNTER — Other Ambulatory Visit: Payer: Self-pay

## 2020-09-23 ENCOUNTER — Ambulatory Visit: Payer: 59 | Admitting: Gastroenterology

## 2020-09-23 VITALS — BP 175/125 | HR 89 | Temp 98.3°F | Ht 62.0 in | Wt 232.2 lb

## 2020-09-23 DIAGNOSIS — K5900 Constipation, unspecified: Secondary | ICD-10-CM | POA: Diagnosis not present

## 2020-09-23 DIAGNOSIS — R1013 Epigastric pain: Secondary | ICD-10-CM

## 2020-09-23 MED ORDER — POLYETHYLENE GLYCOL 3350 17 G PO PACK: 17.0000 g | PACK | Freq: Every day | ORAL | 0 refills | Status: AC

## 2020-09-23 MED ORDER — POLYETHYLENE GLYCOL 3350 17 G PO PACK: 17.0000 g | PACK | Freq: Every day | ORAL | 0 refills | Status: DC

## 2020-09-23 NOTE — Progress Notes (Signed)
Melodie Bouillon, MD [address]  [address]  [place], [place] [address]  Main: [phone]  Fax: [phone]   Primary Care Physician: Alba Cory, MD   Chief Complaint  Patient presents with   Follow-up    Was seen in ED yesterday   Constipation    X 2 months   Nausea    X 2 months    HPI: Kathleen Conley is a 50 y.o. female with history of fibromyalgia, reports having constipation, nausea, abdominal discomfort that she states started after her colonoscopy 2 months ago on July 23, 2020.  Prior to this she does remember having constipation, but states it has worsened since the colonoscopy.  No vomiting.  No weight loss.  Denies abdominal pain but states discomfort feels like a dull ache, mostly bilateral upper quadrant, 5 or 10, nonradiating.  With no aggravating or relieving factors.  Associated with nausea but no vomiting.  Patient visited the ED due to her symptoms yesterday.  Notes reviewed and they obtained right upper quadrant ultrasound, lactic acid, labs.  With reassuring work-up and was recommended to follow-up as an outpatient.  They prescribed her Dulcolax on discharge.  Colonoscopy was done for screening and showed diverticulosis and internal hemorrhoids and was otherwise normal.  Repeat recommended in 10 years.  ROS: All ROS reviewed and negative except as per HPI   Past Medical History:  Diagnosis Date   Abnormal neurological finding suggestive of lumbar-level spinal disorder    Allergy    Anemia    Iron Deficiency   Arthritis    Astigmatism 08/21/2017   Dr. 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 Marcellus Scott   Myopia 08/21/2017   Dr. Marcellus Scott - [facility]    Obesity    Obstructive sleep apnea    no CPAP   Open-angle glaucoma of both eyes, mild stage 08/21/2017   Dr. Marcellus Scott - [facility]   Plantar fasciitis    Pre-diabetes    Presbyopia 07/02/219   Dr. Marcellus Scott   PVC (premature ventricular contraction)     Past Surgical History:  Procedure Laterality Date   ACHILLES TENDON SURGERY Left 08/15/2019   Procedure: TRIPLE ARTHRODESIS AND ACHILLES TENDON LENGTHENING LEFT FOOT;  Surgeon: [name], DPM;  Location: [facility];  Service: Podiatry;  Laterality: Left;   COLONOSCOPY WITH PROPOFOL N/A 07/23/2020   Procedure: COLONOSCOPY WITH PROPOFOL;  Surgeon: Pasty Spillers, MD;  Location: [facility];  Service: Endoscopy;  Laterality: N/A;   FOOT ARTHRODESIS Right 12/01/2016   Procedure: ARTHRODESIS FOOT; TRIPLE;  Surgeon: [name], DPM;  Location: [facility];  Service: Podiatry;  Laterality: Right;   HERNIA REPAIR  10/09/11   Ventral Incarcerated- Dr. [name]   OOPHORECTOMY Left 12/09/2009   TUBAL LIGATION  1993   VENTRAL HERNIA REPAIR  10/09/2011    Prior to Admission medications   Medication Sig Start Date End Date Taking? Authorizing Provider  acetaminophen (TYLENOL) 500 MG tablet Take 1 tablet (500 mg total) by mouth every 6 (six) hours as needed. Max of 3 grams daily or 6 pills dialy Patient taking differently: Take 1,000 mg by mouth  every 6 (six) hours as needed for moderate pain or headache. 06/18/15  Yes Sowles, Danna Hefty, MD  Armodafinil (NUVIGIL) 150 MG tablet Take 1 tablet (150 mg total) by mouth daily as needed (shift work--sleepiness). 08/30/20  Yes Alba Cory, MD  aspirin EC 81 MG tablet Take 1 tablet (81 mg total) by mouth daily. Patient taking differently: Take 81 mg by mouth every evening. 01/28/16  Yes Sowles, Danna Hefty, MD  bisacodyl (DULCOLAX) 5 MG EC tablet Take 1 tablet (5 mg total) by mouth daily as needed for moderate constipation. 09/22/20 09/22/21 Yes Veronese, [place], MD  buPROPion (WELLBUTRIN XL) 150 MG  24 hr tablet Take 1 tablet (150 mg total) by mouth daily. 08/30/20  Yes Sowles, Danna Hefty, MD  cetirizine (ZYRTEC) 10 MG tablet Take 10 mg by mouth every evening.   Yes [provider]  Cholecalciferol (VITAMIN D) 2000 UNITS tablet Take 2,000 Units by mouth every evening.    Yes [provider]  docusate sodium (COLACE) 100 MG capsule Take 100 mg by mouth daily as needed for moderate constipation.    Yes [provider]  DULoxetine (CYMBALTA) 60 MG capsule Take 1 capsule (60 mg total) by mouth every evening. 08/30/20  Yes Sowles, Danna Hefty, MD  ferrous sulfate 325 (65 FE) MG tablet Take 325 mg by mouth every evening.    Yes [provider]  fluticasone (FLONASE) 50 MCG/ACT nasal spray Place 2 sprays into both nostrils daily as needed for allergies. 10/18/18  Yes Sowles, Danna Hefty, MD  ibuprofen (ADVIL) 200 MG tablet Take 200-600 mg by mouth every 6 (six) hours as needed (for pain.).   Yes [provider]  levonorgestrel (MIRENA) 20 MCG/24HR IUD 1 each by Intrauterine route once.    Yes [provider]  metaxalone (SKELAXIN) 800 MG tablet Take 1 tablet (800 mg total) by mouth daily as needed for muscle spasms. 08/30/20  Yes Sowles, Danna Hefty, MD  Multiple Vitamin (MULTIVITAMIN WITH MINERALS) TABS tablet Take 1 tablet by mouth every evening.   Yes [provider]  nystatin (MYCOSTATIN/NYSTOP) powder Apply 1 application topically 2 (two) times daily as needed. 07/31/19  Yes Vena Austria, MD  olmesartan (BENICAR) 40 MG tablet Take 1 tablet (40 mg total) by mouth daily. 08/30/20  Yes Sowles, Danna Hefty, MD  TRAVATAN Z 0.004 % SOLN ophthalmic solution Place 1 drop into both eyes at bedtime. 08/21/17  Yes Pa, [facility]  polyethylene glycol (MIRALAX) 17 g packet Take 17 g by mouth daily. 09/23/20 10/23/20  Pasty Spillers, MD    Family History  Problem Relation Age of Onset   Hypertension Mother    CVA Mother    Kidney disease Mother     Congenital heart disease Mother    Heart disease Father    Hypertension Father    Diabetes Father    Breast cancer Paternal Grandmother        Bilateral   Colon cancer Maternal Grandfather    Colon cancer Maternal Uncle      Social History   Tobacco Use   Smoking status: Never   Smokeless tobacco: Never  Vaping Use   Vaping Use: Never used  Substance Use Topics   Alcohol use: No    Alcohol/week: 0.0 standard drinks   Drug use: No    Allergies as of 09/23/2020 - Review Complete 09/23/2020  Allergen Reaction Noted   Lisinopril Cough 09/14/2016   Norvasc [amlodipine besylate]  03/14/2018   Hctz [hydrochlorothiazide] Other (See Comments) 09/13/2016  Physical Examination:  Constitutional: General:   Alert,  Well-developed, well-nourished, pleasant and cooperative in NAD BP (!) 175/125   Pulse 89   Temp 98.3 F (36.8 C) (Oral)   Ht 5\' 2"  (1.575 m)   Wt 232 lb 3.2 oz (105.3 kg)   BMI 42.47 kg/m   Respiratory: Normal respiratory effort  Gastrointestinal:  Soft, non-tender and non-distended without masses, hepatosplenomegaly or hernias noted.  No guarding or rebound tenderness.     Cardiac: No clubbing or edema.  No cyanosis. Normal posterior tibial pedal pulses noted.  Psych:  Alert and cooperative. Normal mood and affect.  Musculoskeletal:  Normal gait. Head normocephalic, atraumatic. Symmetrical without gross deformities. 5/5 Lower extremity strength bilaterally.  Skin: Warm. Intact without significant lesions or rashes. No jaundice.  Neck: Supple, trachea midline  Lymph: No cervical lymphadenopathy  Psych:  Alert and oriented x3, Alert and cooperative. Normal mood and affect.  Labs: CMP     Component Value Date/Time   NA 140 09/21/2020 2125   NA 141 06/18/2015 1122   NA 138 11/01/2012 1833   K 3.9 09/21/2020 2125   K 4.0 11/01/2012 1833   CL 110 09/21/2020 2125   CL 104 11/01/2012 1833   CO2 24 09/21/2020 2125   CO2 27 11/01/2012 1833   GLUCOSE  108 (H) 09/21/2020 2125   GLUCOSE 92 11/01/2012 1833   BUN 19 09/21/2020 2125   BUN 11 06/18/2015 1122   BUN 7 11/01/2012 1833   CREATININE 0.69 09/21/2020 2125   CREATININE 0.52 05/24/2020 1509   CALCIUM 8.4 (L) 09/21/2020 2125   CALCIUM 10.2 (H) 11/01/2012 1833   PROT 7.7 09/21/2020 2125   PROT 6.7 06/18/2015 1122   PROT 7.9 11/01/2012 1833   ALBUMIN 3.9 09/21/2020 2125   ALBUMIN 4.1 06/18/2015 1122   ALBUMIN 3.5 11/01/2012 1833   AST 17 09/21/2020 2125   AST 17 11/01/2012 1833   ALT 16 09/21/2020 2125   ALT 20 11/01/2012 1833   ALKPHOS 73 09/21/2020 2125   ALKPHOS 83 11/01/2012 1833   BILITOT 0.4 09/21/2020 2125   BILITOT <0.2 06/18/2015 1122   BILITOT 0.2 11/01/2012 1833   GFRNONAA >60 09/21/2020 2125   GFRNONAA 111 05/24/2020 1509   GFRAA 128 05/24/2020 1509   Lab Results  Component Value Date   WBC 14.3 (H) 09/21/2020   HGB 12.1 09/21/2020   HCT 39.5 09/21/2020   MCV 88.0 09/21/2020   PLT 358 09/21/2020   Lipase 99, lactic acid normal at 0.9  Imaging Studies: August 2022  Right upper quadrant ultrasound fatty liver  Abdominal x-ray normal bowel gas pattern  Assessment and Plan:   Kathleen Conley is a 50 y.o. y/o female here for evaluation of abdominal discomfort, nausea, constipation for 2 months  Patient has chronic constipation that has been exacerbated since her colonoscopy according to the patient  She does not take anything for it  High-fiber diet MiraLAX daily with goal of 1-2 soft bowel movements daily.  If not at goal, patient instructed to increase dose to twice daily.  If loose stools with the medication, patient asked to decrease the medication to every other day, or half dose daily.  Patient verbalized understanding  If symptoms not better in 2 to 3 weeks patient advised to call us back or if they worsen prior to that, patient advised to notify us as well  Will check H. pylori breath test due to her nausea and abdominal discomfort  symptoms.  She does not have  pain  Her fibromyalgia may also have a component to play in her symptoms as well  No alarm symptoms present at this time  Patient has had reassuring work-up including above labs which did not show any anemia on CBC, normal liver enzymes on CMP  Lipase was normal as well.  Therefore no indication for imaging of the pancreas  Reassuring imaging with abdominal x-ray showing normal bowel gas pattern.  Right upper quadrant ultrasound does show fatty liver but her liver enzymes are normal.  Weight loss with diet and exercise encouraged  Obtain further labs for fatty liver on next visit  Dr Melodie Bouillon

## 2020-09-23 NOTE — Telephone Encounter (Signed)
Pt aware.

## 2020-10-12 ENCOUNTER — Encounter: Payer: 59 | Admitting: Family Medicine

## 2020-11-15 ENCOUNTER — Ambulatory Visit: Payer: Self-pay | Admitting: Obstetrics and Gynecology

## 2020-11-24 ENCOUNTER — Ambulatory Visit: Payer: 59 | Admitting: Gastroenterology

## 2020-12-13 ENCOUNTER — Other Ambulatory Visit: Payer: Self-pay

## 2020-12-13 ENCOUNTER — Telehealth: Payer: Self-pay

## 2020-12-13 ENCOUNTER — Encounter: Payer: Self-pay | Admitting: Family Medicine

## 2020-12-13 ENCOUNTER — Ambulatory Visit: Payer: 59 | Admitting: Family Medicine

## 2020-12-13 VITALS — BP 158/94 | HR 91 | Temp 98.5°F | Resp 18 | Ht 62.0 in | Wt 230.7 lb

## 2020-12-13 DIAGNOSIS — M62830 Muscle spasm of back: Secondary | ICD-10-CM

## 2020-12-13 DIAGNOSIS — G4733 Obstructive sleep apnea (adult) (pediatric): Secondary | ICD-10-CM | POA: Diagnosis not present

## 2020-12-13 DIAGNOSIS — M797 Fibromyalgia: Secondary | ICD-10-CM

## 2020-12-13 DIAGNOSIS — I10 Essential (primary) hypertension: Secondary | ICD-10-CM

## 2020-12-13 DIAGNOSIS — E785 Hyperlipidemia, unspecified: Secondary | ICD-10-CM | POA: Diagnosis not present

## 2020-12-13 DIAGNOSIS — R739 Hyperglycemia, unspecified: Secondary | ICD-10-CM

## 2020-12-13 DIAGNOSIS — E8881 Metabolic syndrome: Secondary | ICD-10-CM

## 2020-12-13 DIAGNOSIS — F330 Major depressive disorder, recurrent, mild: Secondary | ICD-10-CM

## 2020-12-13 DIAGNOSIS — G4726 Circadian rhythm sleep disorder, shift work type: Secondary | ICD-10-CM

## 2020-12-13 MED ORDER — ARMODAFINIL 150 MG PO TABS: 150.0000 mg | ORAL_TABLET | Freq: Every day | ORAL | 2 refills | Status: DC | PRN

## 2020-12-13 MED ORDER — NEBIVOLOL HCL 5 MG PO TABS: 5.0000 mg | ORAL_TABLET | Freq: Every day | ORAL | 2 refills | Status: DC

## 2020-12-13 MED ORDER — METAXALONE 800 MG PO TABS: 800.0000 mg | ORAL_TABLET | Freq: Every day | ORAL | 2 refills | Status: DC | PRN

## 2020-12-13 MED ORDER — OLMESARTAN MEDOXOMIL 40 MG PO TABS: 40.0000 mg | ORAL_TABLET | Freq: Every day | ORAL | 2 refills | Status: DC

## 2020-12-13 MED ORDER — DULOXETINE HCL 60 MG PO CPEP: 60.0000 mg | ORAL_CAPSULE | Freq: Every evening | ORAL | 2 refills | Status: DC

## 2020-12-13 MED ORDER — RYBELSUS 7 MG PO TABS: 7.0000 mg | ORAL_TABLET | Freq: Every day | ORAL | 0 refills | Status: DC

## 2020-12-13 MED ORDER — BUPROPION HCL ER (XL) 150 MG PO TB24: 150.0000 mg | ORAL_TABLET | Freq: Every day | ORAL | 2 refills | Status: DC

## 2020-12-13 NOTE — Telephone Encounter (Signed)
Left voicemail to let Patient know we have a Rybelsus sample available for her to pick up at her earliest convenience.

## 2020-12-13 NOTE — Progress Notes (Signed)
Name: Kathleen Conley   MRN: [id number]    DOB: 07/27/1970   Date:12/13/2020       Progress Note  Subjective  Chief Complaint  Stomach Pain/Gas  HPI  Major depression: she also has seasonal affective disorder, she was doing well this past Spring and we stopped wellbutrin ( bp was high) and increased dose of Duloxetine,she is unable to take higher dose of Duloxetine because it makes her chew her tongue, she is down to one daily again, explained we can resume wellbutrin and control bp with another medication   Metabolic Syndrome and Obesity: we tried giving her Trulicity but needs step therapy, she did not like  Metformin - caused diarrhea . She denies polyphagia, polyuria or polydipsia. She is only on life style modification and last A1C was 5.9 %. She has a new insurance and she would like to try GLP-1 agonist again . She prefers oral medication , we will try Rybelsus    Obesity: she has a long history of obesity, started after her divorce when she was in her 70's. She tried weight watchers but was unsuccessful, we discussed intermittent fasting , she states she is a stress eater. She will try Rybelsus.    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 She states the pain was so intense that make her scream, it felt intense and like a spasm and had to call out sick Thursday and Friday. She was off over the weekend. She took some Skelaxin and alternated between Tylenol and Ibuprofen, also used heating pad . She states pain on the right side resolved  but is still present on the left side. No rashes. She states it is worse when moving around   OSA: she has mild symptoms, did not qualify for CPAP , avoiding sleeping on her back,  trying to sleep  on her side, she is not waking up with headaches. She also has shift work sleep disorder and states nuvigil helps her stay awake, she works third shift. Taking Modafinil and helps with energy, explained bp is high so we need to control bp to keep taking Modafinil  HTN: bp is elevated again, she is only Benicar, off HCTZ due to side effects - causes her feet to burn , we will add Bystolic since cannot tolerate Norvasc . No chest pain, palpitation or SOB    Leucocytosis and anemia: she has seen hematologist in the past and does not want to go back at this time. She has hidradenitis suppurative .Last WBC high again   Patient Active Problem List   Diagnosis Date Noted   Knee pain 03/15/2020   Morbid obesity (HCC) 04/17/2018   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   Flat foot 01/28/2016   Aortic sclerosis 10/14/2015   Midline low back pain without sciatica 09/20/2015   Leukocytosis 11/29/2014   Osteoarthritis of left knee 11/20/2014   History of ventral hernia repair 10/27/2014   Allergic rhinitis 08/17/2014  Axillary hidradenitis suppurativa 08/17/2014   Baker cyst 08/17/2014   Chronic constipation 08/17/2014   Depression, major, recurrent, mild (HCC) 08/17/2014   Engages in travel abroad 08/17/2014   Fibromyalgia 08/17/2014   Cardiac murmur 08/17/2014   Dysmetabolic syndrome 08/17/2014   Plantar fasciitis 08/17/2014   Beat, premature ventricular 08/17/2014   Abnormal neurological finding suggestive of lumbar-level spinal disorder 08/17/2014   Obstructive apnea 12/02/2013   Essential (primary) hypertension 12/09/2009   Hypertrichosis 12/09/2009    Past Surgical History:  Procedure Laterality Date   ACHILLES TENDON SURGERY Left 08/15/2019   Procedure: TRIPLE ARTHRODESIS AND  ACHILLES TENDON LENGTHENING LEFT FOOT;  Surgeon: [name], DPM;  Location: [facility];  Service: Podiatry;  Laterality: Left;   COLONOSCOPY WITH PROPOFOL N/A 07/23/2020   Procedure: COLONOSCOPY WITH PROPOFOL;  Surgeon: Pasty Spillers, MD;  Location: [facility];  Service: Endoscopy;  Laterality: N/A;   FOOT ARTHRODESIS Right 12/01/2016   Procedure: ARTHRODESIS FOOT; TRIPLE;  Surgeon: [name], DPM;  Location: [facility];  Service: Podiatry;  Laterality: Right;   HERNIA REPAIR  10/09/11   Ventral Incarcerated- Dr. [name]   OOPHORECTOMY Left 12/09/2009   TUBAL LIGATION  1993   VENTRAL HERNIA REPAIR  10/09/2011    Family History  Problem Relation Age of Onset   Hypertension Mother    CVA Mother    Kidney disease Mother    Congenital heart disease Mother    Heart disease Father    Hypertension Father    Diabetes Father    Breast cancer Paternal Grandmother        Bilateral   Colon cancer Maternal Grandfather    Colon cancer Maternal Uncle     Social History   Tobacco Use   Smoking status: Never   Smokeless tobacco: Never  Substance Use Topics   Alcohol use: No    Alcohol/week: 0.0 standard drinks     Current Outpatient Medications:    acetaminophen (TYLENOL) 500 MG tablet, Take 1 tablet (500 mg total) by mouth every 6 (six) hours as needed. Max of 3 grams daily or 6 pills dialy (Patient taking differently: Take 1,000 mg by mouth every 6 (six) hours as needed for moderate pain or headache.), Disp: 30 tablet, Rfl: 0   Armodafinil (NUVIGIL) 150 MG tablet, Take 1 tablet (150 mg total) by mouth daily as needed (shift work--sleepiness)., Disp: 90 tablet, Rfl: 0   aspirin EC 81 MG tablet, Take 1 tablet (81 mg total) by mouth daily. (Patient taking differently: Take 81 mg by mouth every evening.), Disp: 30 tablet, Rfl: 0   bisacodyl (DULCOLAX) 5 MG EC tablet, Take 1 tablet (5 mg total) by mouth daily as needed for moderate constipation., Disp: 30 tablet, Rfl: 1   buPROPion  (WELLBUTRIN XL) 150 MG 24 hr tablet, Take 1 tablet (150 mg total) by mouth daily., Disp: 90 tablet, Rfl: 0   cetirizine (ZYRTEC) 10 MG tablet, Take 10 mg by mouth every evening., Disp: , Rfl:    Cholecalciferol (VITAMIN D) 2000 UNITS tablet, Take 2,000 Units by mouth every evening. , Disp: , Rfl:    docusate sodium (COLACE) 100 MG capsule, Take 100 mg by mouth daily as needed for moderate constipation. , Disp: , Rfl:    DULoxetine (CYMBALTA) 60 MG capsule, Take 1 capsule (60 mg total) by mouth every evening., Disp: 90 capsule, Rfl: 0   ferrous sulfate 325 (65 FE) MG tablet, Take 325 mg by mouth every evening. , Disp: , Rfl:  fluticasone (FLONASE) 50 MCG/ACT nasal spray, Place 2 sprays into both nostrils daily as needed for allergies., Disp: 16 g, Rfl: 1   ibuprofen (ADVIL) 200 MG tablet, Take 200-600 mg by mouth every 6 (six) hours as needed (for pain.)., Disp: , Rfl:    levonorgestrel (MIRENA) 20 MCG/24HR IUD, 1 each by Intrauterine route once. , Disp: , Rfl:    metaxalone (SKELAXIN) 800 MG tablet, Take 1 tablet (800 mg total) by mouth daily as needed for muscle spasms., Disp: 90 tablet, Rfl: 0   Multiple Vitamin (MULTIVITAMIN WITH MINERALS) TABS tablet, Take 1 tablet by mouth every evening., Disp: , Rfl:    nystatin (MYCOSTATIN/NYSTOP) powder, Apply 1 application topically 2 (two) times daily as needed., Disp: 60 g, Rfl: 0   olmesartan (BENICAR) 40 MG tablet, Take 1 tablet (40 mg total) by mouth daily., Disp: 90 tablet, Rfl: 0   TRAVATAN Z 0.004 % SOLN ophthalmic solution, Place 1 drop into both eyes at bedtime., Disp: , Rfl: 3  Allergies  Allergen Reactions   Lisinopril Cough        Norvasc [Amlodipine Besylate]    Hctz [Hydrochlorothiazide] Other (See Comments)    Burning sensation on her feet     I personally reviewed active problem list, medication list, allergies, family history, social history, health maintenance with the patient/caregiver today.   ROS  Constitutional:  Negative for fever or weight change.  Respiratory: Negative for cough and shortness of breath.   Cardiovascular: Negative for chest pain or palpitations.  Gastrointestinal: Negative for abdominal pain, no bowel changes.  Musculoskeletal: positive  for gait problem and intermittent  joint swelling.  Skin: Negative for rash.  Neurological: Negative for dizziness or headache.  No other specific complaints in a complete review of systems (except as listed in HPI above).   Objective  Vitals:   12/13/20 0848  BP: (!) 158/94  Pulse: 91  Resp: 18  Temp: 98.5 F (36.9 C)  TempSrc: Oral  SpO2: 98%  Weight: 230 lb 11.2 oz (104.6 kg)  Height: 5\' 2"  (1.575 m)    Body mass index is 42.2 kg/m.  Physical Exam  Constitutional: Patient appears well-developed and well-nourished. Obese  No distress.  HEENT: head atraumatic, normocephalic, pupils equal and reactive to light,  neck supple Cardiovascular: Normal rate, regular rhythm and normal heart sounds.  No murmur heard. No BLE edema. Pulmonary/Chest: Effort normal and breath sounds normal. No respiratory distress. Abdominal: Soft.  There is no tenderness. Muscular Skeletal: mild pain with palpation of left para spinal muscle of thoracic spine, decrease rom of spine.  Psychiatric: Patient has a normal mood and affect. behavior is normal. Judgment and thought content normal.   Recent Results (from the past 2160 hour(s))  Lipase, blood     Status: None   Collection Time: 09/21/20  9:25 PM  Result Value Ref Range   Lipase 30 11 - 51 U/L    Comment: Performed at [facility], [address] [place], [place]  Comprehensive metabolic panel     Status: Abnormal   Collection Time: 09/21/20  9:25 PM  Result Value Ref Range   Sodium 140 135 - 145 mmol/L   Potassium 3.9 3.5 - 5.1 mmol/L   Chloride 110 98 - 111 mmol/L   CO2 24 22 - 32 mmol/L   Glucose, Bld 108 (H) 70 - 99 mg/dL    Comment: Glucose reference range applies only  to samples taken after fasting for at least 8 hours.  BUN 19 6 - 20 mg/dL   Creatinine, Ser 4.74 0.44 - 1.00 mg/dL   Calcium 8.4 (L) 8.9 - 10.3 mg/dL   Total Protein 7.7 6.5 - 8.1 g/dL   Albumin 3.9 3.5 - 5.0 g/dL   AST 17 15 - 41 U/L   ALT 16 0 - 44 U/L   Alkaline Phosphatase 73 38 - 126 U/L   Total Bilirubin 0.4 0.3 - 1.2 mg/dL   GFR, Estimated >25 >95 mL/min    Comment: (NOTE) Calculated using the CKD-EPI Creatinine Equation (2021)    Anion gap 6 5 - 15    Comment: Performed at [facility], [address] [place], [place] [address]  CBC     Status: Abnormal   Collection Time: 09/21/20  9:25 PM  Result Value Ref Range   WBC 14.3 (H) 4.0 - 10.5 K/uL   RBC 4.49 3.87 - 5.11 MIL/uL   Hemoglobin 12.1 12.0 - 15.0 g/dL   HCT 64.3 32.9 - 51.8 %   MCV 88.0 80.0 - 100.0 fL   MCH 26.9 26.0 - 34.0 pg   MCHC 30.6 30.0 - 36.0 g/dL   RDW 84.1 (H) 66.0 - 63.0 %   Platelets 358 150 - 400 K/uL   nRBC 0.0 0.0 - 0.2 %    Comment: Performed at [facility], [address] [place], [place] [address]  Urinalysis, Complete w Microscopic     Status: Abnormal   Collection Time: 09/21/20  9:25 PM  Result Value Ref Range   Color, Urine YELLOW (A) YELLOW   APPearance CLOUDY (A) CLEAR   Specific Gravity, Urine 1.019 1.005 - 1.030   pH 6.0 5.0 - 8.0   Glucose, UA NEGATIVE NEGATIVE mg/dL   Hgb urine dipstick MODERATE (A) NEGATIVE   Bilirubin Urine NEGATIVE NEGATIVE   Ketones, ur NEGATIVE NEGATIVE mg/dL   Protein, ur 932 (A) NEGATIVE mg/dL   Nitrite NEGATIVE NEGATIVE   Leukocytes,Ua MODERATE (A) NEGATIVE   RBC / HPF 0-5 0 - 5 RBC/hpf   WBC, UA 6-10 0 - 5 WBC/hpf   Bacteria, UA NONE SEEN NONE SEEN   Squamous Epithelial / LPF 21-50 0 - 5   Mucus PRESENT     Comment: Performed at [facility], [address] [place], [place] [address]  Pregnancy, urine     Status: None   Collection Time: 09/21/20  9:25 PM  Result Value Ref Range   Preg Test, Ur NEGATIVE NEGATIVE     Comment: Performed at [facility], [address] [place], [place] [address]  Lactic acid, plasma     Status: None   Collection Time: 09/22/20  2:18 AM  Result Value Ref Range   Lactic Acid, Venous 0.9 0.5 - 1.9 mmol/L    Comment: Performed at [facility], [address] [place], [place] [address]     PHQ2/9: Depression screen [facility] 2/9 12/13/2020 08/30/2020 06/03/2020 05/24/2020 03/15/2020  Decreased Interest 0 3 0 0 0  Down, Depressed, Hopeless 0 3 0 0 0  PHQ - 2 Score 0 6 0 0 0  Altered sleeping 0 0 0 0 -  Tired, decreased energy 0 0 1 1 -  Change in appetite 0 0 0 0 -  Feeling bad or failure about yourself  0 3 0 0 -  Trouble concentrating 0 0 0 0 -  Moving slowly or fidgety/restless 0 0 0 0 -  Suicidal thoughts 0 0 0 0 -  PHQ-9 Score  0 9 1 1  -  Difficult doing work/chores Not difficult at all - - - -  Some recent data might be hidden    phq 9 is negative   Fall Risk: Fall Risk  12/13/2020 08/30/2020 06/03/2020 05/24/2020 03/15/2020  Falls in the past year? 0 0 0 0 0  Number falls in past yr: 0 0 0 0 0  Injury with Fall? 0 0 - 0 0  Risk for fall due to : No Fall Risks - - - -  Follow up Falls prevention discussed - Falls prevention discussed - -      Functional Status Survey: Is the patient deaf or have difficulty hearing?: No Does the patient have difficulty seeing, even when wearing glasses/contacts?: No Does the patient have difficulty concentrating, remembering, or making decisions?: No Does the patient have difficulty walking or climbing stairs?: Yes Does the patient have difficulty dressing or bathing?: No Does the patient have difficulty doing errands alone such as visiting a doctor's office or shopping?: No    Assessment & Plan  1. Depression, major, recurrent, mild (HCC)  - DULoxetine (CYMBALTA) 60 MG capsule; Take 1 capsule (60 mg total) by mouth every evening.  Dispense: 30 capsule; Refill: 2 - buPROPion (WELLBUTRIN XL) 150 MG 24 hr  tablet; Take 1 tablet (150 mg total) by mouth daily.  Dispense: 30 tablet; Refill: 2  2. Fibromyalgia  - DULoxetine (CYMBALTA) 60 MG capsule; Take 1 capsule (60 mg total) by mouth every evening.  Dispense: 30 capsule; Refill: 2 - Armodafinil (NUVIGIL) 150 MG tablet; Take 1 tablet (150 mg total) by mouth daily as needed (shift work--sleepiness).  Dispense: 30 tablet; Refill: 2 - metaxalone (SKELAXIN) 800 MG tablet; Take 1 tablet (800 mg total) by mouth daily as needed for muscle spasms.  Dispense: 30 tablet; Refill: 2  3. Obstructive apnea  - Armodafinil (NUVIGIL) 150 MG tablet; Take 1 tablet (150 mg total) by mouth daily as needed (shift work--sleepiness).  Dispense: 30 tablet; Refill: 2  4. Shift work sleep disorder  - Armodafinil (NUVIGIL) 150 MG tablet; Take 1 tablet (150 mg total) by mouth daily as needed (shift work--sleepiness).  Dispense: 30 tablet; Refill: 2  5. Essential (primary) hypertension  - olmesartan (BENICAR) 40 MG tablet; Take 1 tablet (40 mg total) by mouth daily.  Dispense: 30 tablet; Refill: 2 - nebivolol (BYSTOLIC) 5 MG tablet; Take 1 tablet (5 mg total) by mouth daily.  Dispense: 30 tablet; Refill: 2  6. Dyslipidemia   7. Hyperglycemia   8. Morbid obesity (HCC)  - Semaglutide (RYBELSUS) 7 MG TABS; Take 7 mg by mouth daily. For insulin resistance and weight loss  Dispense: 90 tablet; Refill: 0  9. Dysmetabolic syndrome  - Semaglutide (RYBELSUS) 7 MG TABS; Take 7 mg by mouth daily. For insulin resistance and weight loss  Dispense: 90 tablet; Refill: 0    10. Spasm of thoracic back muscle

## 2021-01-10 ENCOUNTER — Encounter: Payer: Self-pay | Admitting: Family Medicine

## 2021-01-11 ENCOUNTER — Encounter: Payer: Self-pay | Admitting: Internal Medicine

## 2021-01-11 ENCOUNTER — Other Ambulatory Visit: Payer: Self-pay

## 2021-01-11 ENCOUNTER — Telehealth (INDEPENDENT_AMBULATORY_CARE_PROVIDER_SITE_OTHER): Payer: 59 | Admitting: Internal Medicine

## 2021-01-11 VITALS — Ht 62.0 in | Wt 230.0 lb

## 2021-01-11 DIAGNOSIS — U071 COVID-19: Secondary | ICD-10-CM

## 2021-01-11 MED ORDER — MOLNUPIRAVIR EUA 200MG CAPSULE
4.0000 | ORAL_CAPSULE | Freq: Two times a day (BID) | ORAL | 0 refills | Status: AC
  Filled 2021-01-11: qty 40, 5d supply, fill #0

## 2021-01-11 NOTE — Progress Notes (Signed)
Virtual Visit via Telephone Note  I connected with Kathleen Conley on 01/11/21 at  1:00 PM EST by telephone and verified that I am speaking with the correct person using two identifiers.  Location: Patient: Home Provider: [facility]   I discussed the limitations, risks, security and privacy concerns of performing an evaluation and management service by telephone and the availability of in person appointments. I also discussed with the patient that there may be a patient responsible charge related to this service. The patient expressed understanding and agreed to proceed.  History of Present Illness:  Consepcion Utt is a 50 year old female presenting via telemedicine after a positive COVID test. Chronic medical conditions include HTN, OSA, MDD.  COVID: tested positive Sunday 11/20, symptoms started the same day, headache, sore throat, coughing. 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 She did have 2 COVID vaccines and one booster.   -Worst symptom: headache -Fever: no -Cough: yes, dry cough -Shortness of breath: no -Wheezing: no -Chest pain: no -Chest tightness: no -Chest congestion: no -Nasal congestion: no -Runny nose: no -Post nasal drip: yes -Sneezing: no -Sore throat: yes -Headache: yes -Vomiting: no -Fatigue: yes -Sick contacts: yes -Context: stable -Relief with OTC cold/cough medications: no  -Treatments attempted: cold/sinus and cough syrup Tylenol, Robitussin DM (cough better)   Observations/Objective:  General: well appearing, no acute distress ENT: conjunctiva normal appearing bilaterally  Neuro: A&O  Assessment and Plan:  1. COVID: As she is within the first 5 days of being symptomatic, she will be treated with antiviral therapy. She can continue symptomatic treatment. She was given a note for work.   - molnupiravir EUA (LAGEVRIO) 200 mg CAPS capsule; Take 4 capsules (800 mg total) by mouth 2 (two) times daily for 5 days.  Dispense: 40  capsule; Refill: 0   Follow Up Instructions: as needed if symptoms worsen or fail to improve.    I discussed the assessment and treatment plan with the patient. The patient was provided an opportunity to ask questions and all were answered. The patient agreed with the plan and demonstrated an understanding of the instructions.   The patient was advised to call back or seek an in-person evaluation if the symptoms worsen or if the condition fails to improve as anticipated.  I provided 11 minutes of non-face-to-face time during this encounter.   Margarita Mail, DO

## 2021-01-18 ENCOUNTER — Ambulatory Visit: Payer: 59 | Admitting: Internal Medicine

## 2021-01-18 ENCOUNTER — Other Ambulatory Visit: Payer: Self-pay

## 2021-01-18 ENCOUNTER — Encounter: Payer: Self-pay | Admitting: Internal Medicine

## 2021-01-18 VITALS — BP 125/80 | HR 78 | Temp 98.0°F | Resp 16 | Ht 62.0 in | Wt 230.5 lb

## 2021-01-18 DIAGNOSIS — I10 Essential (primary) hypertension: Secondary | ICD-10-CM

## 2021-01-18 DIAGNOSIS — R8290 Unspecified abnormal findings in urine: Secondary | ICD-10-CM

## 2021-01-18 DIAGNOSIS — U071 COVID-19: Secondary | ICD-10-CM

## 2021-01-18 LAB — POCT URINALYSIS DIPSTICK
Bilirubin, UA: NEGATIVE
Glucose, UA: NEGATIVE
Ketones, UA: NEGATIVE
Leukocytes, UA: NEGATIVE
Nitrite, UA: NEGATIVE
Protein, UA: NEGATIVE
Spec Grav, UA: 1.015 (ref 1.010–1.025)
Urobilinogen, UA: 0.2 E.U./dL
pH, UA: 5.5 (ref 5.0–8.0)

## 2021-01-18 NOTE — Patient Instructions (Addendum)
It was great seeing you today!  Plan discussed at today's visit: -Urine clear today, stay well hydrated  Follow up in: keep follow up on 1/30  Take care and let us know if you have any questions or concerns prior to your next visit.  Dr. Caralee Ates

## 2021-01-18 NOTE — Progress Notes (Signed)
Acute Office Visit  Subjective:    Patient ID: Kathleen Conley, female    DOB: Jun 20, 1970, 50 y.o.   MRN: [id number]  Chief Complaint  Patient presents with   Urinary Tract Infection    Foul/strong smelling urine    HPI Patient is in today for possible UTI. Noticed on Sunday that the urine was a strong foul odor, but it is getting better now. She tested positive for COVID last week but was treated with antiviral medication and is doing much better now. She has since had 2 negative tests and is returning to work. 125/80  URINARY SYMPTOMS Dysuria: no Urinary frequency: no Urgency: no Small volume voids: no Urinary incontinence: yes Foul odor: yes Hematuria: no Abdominal pain: no Back pain: no Suprapubic pain/pressure: no Flank pain: no Fever:  no Vomiting: no Status: better  Hypertension: -Medications: Benicar 40, Bystolic 5 mg -Patient is compliant with above medications and reports no side effects. -Checking BP at home (average): Not checking -Denies any SOB, CP, vision changes, LE edema or symptoms of hypotension  Past Medical History:  Diagnosis Date   Abnormal neurological finding suggestive of lumbar-level spinal disorder    Allergy    Anemia    Iron Deficiency   Arthritis    Astigmatism 08/21/2017   Dr. 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 Marcellus Scott   Myopia 08/21/2017   Dr. Marcellus Scott - [facility]   Obesity    Obstructive sleep apnea    no CPAP   Open-angle glaucoma of both eyes, mild stage 08/21/2017   Dr. Marcellus Scott - [facility]   Plantar fasciitis    Pre-diabetes    Presbyopia 07/02/219   Dr. Marcellus Scott   PVC (premature ventricular contraction)     Past  Surgical History:  Procedure Laterality Date   ACHILLES TENDON SURGERY Left 08/15/2019   Procedure: TRIPLE ARTHRODESIS AND ACHILLES TENDON LENGTHENING LEFT FOOT;  Surgeon: [name], DPM;  Location: [facility];  Service: Podiatry;  Laterality: Left;   COLONOSCOPY WITH PROPOFOL N/A 07/23/2020   Procedure: COLONOSCOPY WITH PROPOFOL;  Surgeon: Pasty Spillers, MD;  Location: [facility];  Service: Endoscopy;  Laterality: N/A;   FOOT ARTHRODESIS Right 12/01/2016   Procedure: ARTHRODESIS FOOT; TRIPLE;  Surgeon: [name], DPM;  Location: [facility];  Service: Podiatry;  Laterality: Right;   HERNIA REPAIR  10/09/11   Ventral Incarcerated- Dr. [name]   OOPHORECTOMY Left 12/09/2009   TUBAL LIGATION  1993   VENTRAL HERNIA REPAIR  10/09/2011    Family History  Problem Relation Age of Onset   Hypertension Mother    CVA Mother    Kidney disease Mother    Congenital heart disease Mother    Heart disease Father    Hypertension Father    Diabetes Father    Breast cancer Paternal Grandmother        Bilateral   Colon cancer Maternal Grandfather    Colon cancer Maternal Uncle     Social History   Socioeconomic History   Marital status: Married    Spouse name: Not on file   Number of children: 3   Years  of education: Associates   Highest education level: Associate degree: academic program  Occupational History   Occupation: LPN at the [employer]  Tobacco Use   Smoking status: Never   Smokeless tobacco: Never  Vaping Use   Vaping Use: Never used  Substance and Sexual Activity   Alcohol use: No    Alcohol/week: 0.0 standard drinks   Drug use: No   Sexual activity: Yes    Partners: Male    Birth control/protection: I.U.D.  Other Topics Concern   Not on file  Social History Narrative   Married Moses in 2016 in [place] and he finally got visa to move to [place] Nov 2019   Social Determinants of Health   Financial Resource Strain: Not on file  Food Insecurity: Not on  file  Transportation Needs: Not on file  Physical Activity: Not on file  Stress: Not on file  Social Connections: Not on file  Intimate Partner Violence: Not on file    Outpatient Medications Prior to Visit  Medication Sig Dispense Refill   acetaminophen (TYLENOL) 500 MG tablet Take 1 tablet (500 mg total) by mouth every 6 (six) hours as needed. Max of 3 grams daily or 6 pills dialy (Patient taking differently: Take 1,000 mg by mouth every 6 (six) hours as needed for moderate pain or headache.) 30 tablet 0   Armodafinil (NUVIGIL) 150 MG tablet Take 1 tablet (150 mg total) by mouth daily as needed (shift work--sleepiness). 30 tablet 2   aspirin EC 81 MG tablet Take 1 tablet (81 mg total) by mouth daily. (Patient taking differently: Take 81 mg by mouth every evening.) 30 tablet 0   bisacodyl (DULCOLAX) 5 MG EC tablet Take 1 tablet (5 mg total) by mouth daily as needed for moderate constipation. 30 tablet 1   buPROPion (WELLBUTRIN XL) 150 MG 24 hr tablet Take 1 tablet (150 mg total) by mouth daily. 30 tablet 2   cetirizine (ZYRTEC) 10 MG tablet Take 10 mg by mouth every evening.     Cholecalciferol (VITAMIN D) 2000 UNITS tablet Take 2,000 Units by mouth every evening.      docusate sodium (COLACE) 100 MG capsule Take 100 mg by mouth daily as needed for moderate constipation.      DULoxetine (CYMBALTA) 60 MG capsule Take 1 capsule (60 mg total) by mouth every evening. 30 capsule 2   ferrous sulfate 325 (65 FE) MG tablet Take 325 mg by mouth every evening.      fluticasone (FLONASE) 50 MCG/ACT nasal spray Place 2 sprays into both nostrils daily as needed for allergies. 16 g 1   ibuprofen (ADVIL) 200 MG tablet Take 200-600 mg by mouth every 6 (six) hours as needed (for pain.).     levonorgestrel (MIRENA) 20 MCG/24HR IUD 1 each by Intrauterine route once.      metaxalone (SKELAXIN) 800 MG tablet Take 1 tablet (800 mg total) by mouth daily as needed for muscle spasms. 30 tablet 2   Multiple Vitamin  (MULTIVITAMIN WITH MINERALS) TABS tablet Take 1 tablet by mouth every evening.     nebivolol (BYSTOLIC) 5 MG tablet Take 1 tablet (5 mg total) by mouth daily. 30 tablet 2   nystatin (MYCOSTATIN/NYSTOP) powder Apply 1 application topically 2 (two) times daily as needed. 60 g 0   olmesartan (BENICAR) 40 MG tablet Take 1 tablet (40 mg total) by mouth daily. 30 tablet 2   Semaglutide (RYBELSUS) 7 MG TABS Take 7 mg by mouth daily. For insulin  resistance and weight loss 90 tablet 0   TRAVATAN Z 0.004 % SOLN ophthalmic solution Place 1 drop into both eyes at bedtime.  3   No facility-administered medications prior to visit.    Allergies  Allergen Reactions   Lisinopril Cough        Norvasc [Amlodipine Besylate]    Hctz [Hydrochlorothiazide] Other (See Comments)    Burning sensation on her feet     Review of Systems  Constitutional:  Negative for chills and fever.  Respiratory:  Negative for cough and shortness of breath.   Cardiovascular:  Negative for chest pain.  Gastrointestinal:  Negative for abdominal pain, nausea and vomiting.  Genitourinary:  Negative for dysuria, flank pain, frequency, hematuria and urgency.  Neurological:  Negative for light-headedness and headaches.      Objective:    Physical Exam Constitutional:      Appearance: Normal appearance.  HENT:     Head: Normocephalic and atraumatic.  Eyes:     Conjunctiva/sclera: Conjunctivae normal.  Cardiovascular:     Rate and Rhythm: Normal rate and regular rhythm.  Pulmonary:     Effort: Pulmonary effort is normal.     Breath sounds: Normal breath sounds.  Abdominal:     General: There is no distension.     Palpations: Abdomen is soft.     Tenderness: There is no abdominal tenderness. There is no right CVA tenderness, left CVA tenderness, guarding or rebound.  Musculoskeletal:     Right lower leg: No edema.     Left lower leg: No edema.  Skin:    General: Skin is warm and dry.  Neurological:     General: No  focal deficit present.     Mental Status: She is alert. Mental status is at baseline.  Psychiatric:        Mood and Affect: Mood normal.        Behavior: Behavior normal.    BP 125/80   Pulse 78   Temp 98 F (36.7 C)   Resp 16   Ht 5\' 2"  (1.575 m)   Wt 230 lb 8 oz (104.6 kg)   SpO2 98%   BMI 42.16 kg/m  Wt Readings from Last 3 Encounters:  01/11/21 230 lb (104.3 kg)  12/13/20 230 lb 11.2 oz (104.6 kg)  09/23/20 232 lb 3.2 oz (105.3 kg)    Health Maintenance Due  Topic Date Due   COVID-19 Vaccine (1) Never done   Zoster Vaccines- Shingrix (1 of 2) Never done   INFLUENZA VACCINE  09/20/2020   MAMMOGRAM  10/20/2020    There are no preventive care reminders to display for this patient.   Lab Results  Component Value Date   TSH 3.36 05/31/2016   Lab Results  Component Value Date   WBC 14.3 (H) 09/21/2020   HGB 12.1 09/21/2020   HCT 39.5 09/21/2020   MCV 88.0 09/21/2020   PLT 358 09/21/2020   Lab Results  Component Value Date   NA 140 09/21/2020   K 3.9 09/21/2020   CO2 24 09/21/2020   GLUCOSE 108 (H) 09/21/2020   BUN 19 09/21/2020   CREATININE 0.69 09/21/2020   BILITOT 0.4 09/21/2020   ALKPHOS 73 09/21/2020   AST 17 09/21/2020   ALT 16 09/21/2020   PROT 7.7 09/21/2020   ALBUMIN 3.9 09/21/2020   CALCIUM 8.4 (L) 09/21/2020   ANIONGAP 6 09/21/2020   Lab Results  Component Value Date   CHOL 189 05/24/2020   Lab Results  Component Value Date   HDL 46 (L) 05/24/2020   Lab Results  Component Value Date   LDLCALC 109 (H) 05/24/2020   Lab Results  Component Value Date   TRIG 225 (H) 05/24/2020   Lab Results  Component Value Date   CHOLHDL 4.1 05/24/2020   Lab Results  Component Value Date   HGBA1C 5.9 (H) 05/24/2020       Assessment & Plan:   1. Foul smelling urine: UA in the office today negative for leukocytes/nitrites, the foul smelling urine is the only symptom and resolving, may be side effect of medication. Encouraged oral hydration  and follow up if symptoms return.  - POCT Urinalysis Dipstick  2. COVID: Resolved, discussed getting COVID booster in 3 months.   3. Essential (primary) hypertension: Blood pressure initially high at 152/86, recheck was much better. Continue hypertension medications, follow up in 2 months for regularly scheduled visit.    Teodora Medici, DO

## 2021-01-25 NOTE — Telephone Encounter (Signed)
03/26/2018-Mirena - initially placement 11/14/2012. Discussed data supporting 7 years of use.  We also discussed that average age of menopause is 74 and that she may not need another IUD following the removal of this one.   - FSH estradiol with plan of potentially having patient transition into menopause as opposed to having to replace IUD

## 2021-03-18 NOTE — Progress Notes (Signed)
Name: Kathleen Conley   MRN: [id number]    DOB: 1970-04-18   Date:03/21/2021       Progress Note  Subjective  Chief Complaint  Follow Up  HPI  Major depression: she is currently on Wellbutrin XL 150 mg in am and Duloxetine and seems to be controlling symptoms, she has seasonal affective disorder but she wants to take combo year round, we may need to go up to 300 mg seasonally if neeeded   Metabolic Syndrome and Obesity: we tried giving her Trulicity but needs step therapy, she did not like  Metformin - caused diarrhea . She denies polyphagia, polyuria or polydipsia. She is only on life style modification and last A1C was 5.9 %. We gave her a rx of Rybelsus on her last visit but insurance denied coverage    Obesity: she has a long history of obesity, started after her divorce when she was in her 69's. She tried weight watchers but was unsuccessful, we discussed intermittent fasting , she states she is a stress eater. She will try Rybelsus.    FMS: she states pain is better with Cymbalta, could not tolerate Lyrica ( caused nightmares and vivid dreams) also stopped Gabapentin ( she was grinding her teeth), taking skelaxin prn now. She has been doing well. She states today pain is 0/10   Back pain : she continues to have low back pain , near her tail bone, it is intermittent worse at the end of her work day, better with biofreeze    OSA: she has mild symptoms, did not qualify for CPAP , avoiding sleeping on her back,  trying to sleep  on her side. She also has shift work sleep disorder and states nuvigil helps her stay awake, she works third shift. Taking Modafinil and helps with energy  HTN: bp is elevated again, she is currently on Bystolic and Benicar and is doing well now, bp is at goal. She denies chest pain or palpitation or dizziness  Leucocytosis and anemia: she has seen hematologist in the past and does not want to go back at this time. She has hidradenitis suppurative .Last WBC done  at [facility] was stable.   AR: she states yesterday noticed nasal congestion and facial pressure, she takes Zyrtec daily, added Flonase and symptoms improved but back again today. She had COVID-19 end of Nov. No fever or chills. Advised to resume Flonase  Patient Active Problem List   Diagnosis Date Noted   Spasm of thoracic back muscle 12/13/2020   Knee pain 03/15/2020   Morbid obesity (HCC) 04/17/2018   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   Flat foot 01/28/2016   Aortic sclerosis 10/14/2015   Midline low back pain without sciatica 09/20/2015   Leukocytosis 11/29/2014   Osteoarthritis of left knee 11/20/2014   History of ventral hernia repair 10/27/2014   Allergic rhinitis 08/17/2014   Axillary hidradenitis suppurativa 08/17/2014   Baker cyst 08/17/2014   Chronic constipation 08/17/2014   Depression, major, recurrent, mild (HCC) 08/17/2014   Engages in travel abroad 08/17/2014   Fibromyalgia 08/17/2014   Cardiac murmur 08/17/2014   Dysmetabolic syndrome 08/17/2014   Plantar fasciitis 08/17/2014   Beat, premature ventricular 08/17/2014   Abnormal neurological finding suggestive of lumbar-level spinal disorder 08/17/2014   Obstructive apnea 12/02/2013   Essential (primary) hypertension 12/09/2009   Hypertrichosis 12/09/2009    Past Surgical History:  Procedure Laterality Date   ACHILLES TENDON SURGERY Left 08/15/2019   Procedure: TRIPLE ARTHRODESIS AND ACHILLES TENDON LENGTHENING  LEFT FOOT;  Surgeon: Gwyneth RevelsFowler, Justin, DPM;  Location: [facility];  Service: Podiatry;  Laterality: Left;   COLONOSCOPY WITH PROPOFOL N/A 07/23/2020   Procedure: COLONOSCOPY WITH PROPOFOL;  Surgeon: Pasty Spillersahiliani, Varnita B, MD;  Location: [facility];  Service: Endoscopy;  Laterality: N/A;   FOOT ARTHRODESIS Right 12/01/2016   Procedure: ARTHRODESIS FOOT; TRIPLE;  Surgeon: Gwyneth RevelsFowler, Justin, DPM;  Location: [facility];  Service: Podiatry;  Laterality: Right;   HERNIA REPAIR  10/09/11   Ventral Incarcerated-  [name]   OOPHORECTOMY Left 12/09/2009   TUBAL LIGATION  1993   VENTRAL HERNIA REPAIR  10/09/2011    Family History  Problem Relation Age of Onset   Hypertension Mother    CVA Mother    Kidney disease Mother    Congenital heart disease Mother    Heart disease Father    Hypertension Father    Diabetes Father    Breast cancer Paternal Grandmother        Bilateral   Colon cancer Maternal Grandfather    Colon cancer Maternal Uncle     Social History   Tobacco Use   Smoking status: Never   Smokeless tobacco: Never  Substance Use Topics   Alcohol use: No    Alcohol/week: 0.0 standard drinks     Current Outpatient Medications:    acetaminophen (TYLENOL) 500 MG tablet, Take 1 tablet (500 mg total) by mouth every 6 (six) hours as needed. Max of 3 grams daily or 6 pills dialy (Patient taking differently: Take 1,000 mg by mouth every 6 (six) hours as needed for moderate pain or headache.), Disp: 30 tablet, Rfl: 0   Armodafinil (NUVIGIL) 150 MG tablet, Take 1 tablet (150 mg total) by mouth daily as needed (shift work--sleepiness)., Disp: 30 tablet, Rfl: 2   aspirin EC 81 MG tablet, Take 1 tablet (81 mg total) by mouth daily. (Patient taking differently: Take 81 mg by mouth every evening.), Disp: 30 tablet, Rfl: 0   bisacodyl (DULCOLAX) 5 MG EC tablet, Take 1 tablet (5 mg total) by mouth daily as needed for moderate constipation., Disp: 30 tablet, Rfl: 1   buPROPion (WELLBUTRIN XL) 150 MG 24 hr tablet, Take 1 tablet (150 mg total) by mouth daily., Disp: 30 tablet, Rfl: 2   cetirizine (ZYRTEC) 10 MG tablet, Take 10 mg by mouth every evening., Disp: , Rfl:    Cholecalciferol (VITAMIN D) 2000 UNITS tablet, Take 2,000 Units by mouth every evening. , Disp: , Rfl:    docusate sodium (COLACE) 100 MG capsule, Take 100 mg by mouth daily as needed for moderate constipation. , Disp: , Rfl:    DULoxetine (CYMBALTA) 60 MG capsule, Take 1 capsule (60 mg total) by mouth every evening., Disp: 30 capsule,  Rfl: 2   ferrous sulfate 325 (65 FE) MG tablet, Take 325 mg by mouth every evening. , Disp: , Rfl:    fluticasone (FLONASE) 50 MCG/ACT nasal spray, Place 2 sprays into both nostrils daily as needed for allergies., Disp: 16 g, Rfl: 1   ibuprofen (ADVIL) 200 MG tablet, Take 200-600 mg by mouth every 6 (six) hours as needed (for pain.)., Disp: , Rfl:    levonorgestrel (MIRENA) 20 MCG/24HR IUD, 1 each by Intrauterine route once. , Disp: , Rfl:    metaxalone (SKELAXIN) 800 MG tablet, Take 1 tablet (800 mg total) by mouth daily as needed for muscle spasms., Disp: 30 tablet, Rfl: 2   Multiple Vitamin (MULTIVITAMIN WITH MINERALS) TABS tablet, Take 1 tablet by mouth every evening.,  Disp: , Rfl:    nebivolol (BYSTOLIC) 5 MG tablet, Take 1 tablet (5 mg total) by mouth daily., Disp: 30 tablet, Rfl: 2   olmesartan (BENICAR) 40 MG tablet, Take 1 tablet (40 mg total) by mouth daily., Disp: 30 tablet, Rfl: 2   Semaglutide (RYBELSUS) 7 MG TABS, Take 7 mg by mouth daily. For insulin resistance and weight loss, Disp: 90 tablet, Rfl: 0   TRAVATAN Z 0.004 % SOLN ophthalmic solution, Place 1 drop into both eyes at bedtime., Disp: , Rfl: 3   nystatin (MYCOSTATIN/NYSTOP) powder, Apply 1 application topically 2 (two) times daily as needed. (Patient not taking: Reported on 03/21/2021), Disp: 60 g, Rfl: 0  Allergies  Allergen Reactions   Lisinopril Cough        Norvasc [Amlodipine Besylate]    Hctz [Hydrochlorothiazide] Other (See Comments)    Burning sensation on her feet     I personally reviewed active problem list, medication list, allergies, family history, social history, health maintenance with the patient/caregiver today.   ROS  Constitutional: Negative for fever or weight change.  Respiratory: Negative for cough and shortness of breath.   Cardiovascular: Negative for chest pain or palpitations.  Gastrointestinal: Negative for abdominal pain, no bowel changes.  Musculoskeletal: Negative for gait problem  or joint swelling.  Skin: Negative for rash.  Neurological: Negative for dizziness or headache.  No other specific complaints in a complete review of systems (except as listed in HPI above).   Objective  Vitals:   03/21/21 1046  BP: 136/70  Pulse: 90  Resp: 16  SpO2: 98%  Weight: 233 lb (105.7 kg)  Height: 5\' 2"  (1.575 m)    Body mass index is 42.62 kg/m.  Physical Exam  Constitutional: Patient appears well-developed and well-nourished. Obese  No distress.  HEENT: head atraumatic, normocephalic, pupils equal and reactive to light, neck supple Cardiovascular: Normal rate, regular rhythm and normal heart sounds.  No murmur heard. No BLE edema. Pulmonary/Chest: Effort normal and breath sounds normal. No respiratory distress. Abdominal: Soft.  There is no tenderness. Psychiatric: Patient has a normal mood and affect. behavior is normal. Judgment and thought content normal.   Recent Results (from the past 2160 hour(s))  POCT Urinalysis Dipstick     Status: Abnormal   Collection Time: 01/18/21  9:55 AM  Result Value Ref Range   Color, UA yellow    Clarity, UA clear    Glucose, UA Negative Negative   Bilirubin, UA neg    Ketones, UA neg    Spec Grav, UA 1.015 1.010 - 1.025   Blood, UA trace    pH, UA 5.5 5.0 - 8.0   Protein, UA Negative Negative   Urobilinogen, UA 0.2 0.2 or 1.0 E.U./dL   Nitrite, UA neg    Leukocytes, UA Negative Negative   Appearance clear    Odor foul/strong      PHQ2/9: Depression screen [facility] 2/9 03/21/2021 01/18/2021 01/11/2021 12/13/2020 08/30/2020  Decreased Interest 0 0 0 0 3  Down, Depressed, Hopeless 0 0 0 0 3  PHQ - 2 Score 0 0 0 0 6  Altered sleeping 0 0 0 0 0  Tired, decreased energy 0 0 0 0 0  Change in appetite 0 0 0 0 0  Feeling bad or failure about yourself  0 0 0 0 3  Trouble concentrating 0 0 0 0 0  Moving slowly or fidgety/restless 0 0 0 0 0  Suicidal thoughts 0 0 0 0 0  PHQ-9  Score 0 0 0 0 9  Difficult doing work/chores - Not  difficult at all Not difficult at all Not difficult at all -  Some recent data might be hidden    phq 9 is negative   Fall Risk: Fall Risk  03/21/2021 01/18/2021 01/11/2021 12/13/2020 08/30/2020  Falls in the past year? 0 0 0 0 0  Number falls in past yr: 0 0 0 0 0  Injury with Fall? 0 0 0 0 0  Risk for fall due to : No Fall Risks - - No Fall Risks -  Follow up Falls prevention discussed - - Falls prevention discussed -      Functional Status Survey: Is the patient deaf or have difficulty hearing?: No Does the patient have difficulty seeing, even when wearing glasses/contacts?: No Does the patient have difficulty concentrating, remembering, or making decisions?: No Does the patient have difficulty walking or climbing stairs?: Yes Does the patient have difficulty dressing or bathing?: No Does the patient have difficulty doing errands alone such as visiting a doctor's office or shopping?: No    Assessment & Plan  1. Depression, major, recurrent, mild (HCC)  - buPROPion (WELLBUTRIN XL) 150 MG 24 hr tablet; Take 1 tablet (150 mg total) by mouth daily.  Dispense: 30 tablet; Refill: 2 - DULoxetine (CYMBALTA) 60 MG capsule; Take 1 capsule (60 mg total) by mouth every evening.  Dispense: 30 capsule; Refill: 2  2. Fibromyalgia  - Armodafinil (NUVIGIL) 150 MG tablet; Take 1 tablet (150 mg total) by mouth daily as needed (shift work--sleepiness).  Dispense: 30 tablet; Refill: 2 - DULoxetine (CYMBALTA) 60 MG capsule; Take 1 capsule (60 mg total) by mouth every evening.  Dispense: 30 capsule; Refill: 2 - metaxalone (SKELAXIN) 800 MG tablet; Take 1 tablet (800 mg total) by mouth daily as needed for muscle spasms.  Dispense: 30 tablet; Refill: 2  3. Obstructive apnea  - Armodafinil (NUVIGIL) 150 MG tablet; Take 1 tablet (150 mg total) by mouth daily as needed (shift work--sleepiness).  Dispense: 30 tablet; Refill: 2  4. Shift work sleep disorder  - Armodafinil (NUVIGIL) 150 MG tablet;  Take 1 tablet (150 mg total) by mouth daily as needed (shift work--sleepiness).  Dispense: 30 tablet; Refill: 2  5. Essential (primary) hypertension  - nebivolol (BYSTOLIC) 5 MG tablet; Take 1 tablet (5 mg total) by mouth daily.  Dispense: 30 tablet; Refill: 2 - olmesartan (BENICAR) 40 MG tablet; Take 1 tablet (40 mg total) by mouth daily.  Dispense: 30 tablet; Refill: 2

## 2021-03-21 ENCOUNTER — Encounter: Payer: Self-pay | Admitting: Family Medicine

## 2021-03-21 ENCOUNTER — Ambulatory Visit: Payer: 59 | Admitting: Family Medicine

## 2021-03-21 VITALS — BP 136/70 | HR 90 | Resp 16 | Ht 62.0 in | Wt 233.0 lb

## 2021-03-21 DIAGNOSIS — F330 Major depressive disorder, recurrent, mild: Secondary | ICD-10-CM | POA: Diagnosis not present

## 2021-03-21 DIAGNOSIS — G4726 Circadian rhythm sleep disorder, shift work type: Secondary | ICD-10-CM

## 2021-03-21 DIAGNOSIS — G4733 Obstructive sleep apnea (adult) (pediatric): Secondary | ICD-10-CM

## 2021-03-21 DIAGNOSIS — Z23 Encounter for immunization: Secondary | ICD-10-CM | POA: Diagnosis not present

## 2021-03-21 DIAGNOSIS — M797 Fibromyalgia: Secondary | ICD-10-CM

## 2021-03-21 DIAGNOSIS — I10 Essential (primary) hypertension: Secondary | ICD-10-CM

## 2021-03-21 MED ORDER — ARMODAFINIL 150 MG PO TABS: 150.0000 mg | ORAL_TABLET | Freq: Every day | ORAL | 2 refills | Status: DC | PRN

## 2021-03-21 MED ORDER — NEBIVOLOL HCL 5 MG PO TABS: 5.0000 mg | ORAL_TABLET | Freq: Every day | ORAL | 2 refills | Status: DC

## 2021-03-21 MED ORDER — METAXALONE 800 MG PO TABS: 800.0000 mg | ORAL_TABLET | Freq: Every day | ORAL | 2 refills | Status: DC | PRN

## 2021-03-21 MED ORDER — BUPROPION HCL ER (XL) 150 MG PO TB24: 150.0000 mg | ORAL_TABLET | Freq: Every day | ORAL | 2 refills | Status: DC

## 2021-03-21 MED ORDER — DULOXETINE HCL 60 MG PO CPEP: 60.0000 mg | ORAL_CAPSULE | Freq: Every evening | ORAL | 2 refills | Status: DC

## 2021-03-21 MED ORDER — OLMESARTAN MEDOXOMIL 40 MG PO TABS: 40.0000 mg | ORAL_TABLET | Freq: Every day | ORAL | 2 refills | Status: DC

## 2021-04-18 ENCOUNTER — Ambulatory Visit: Payer: Self-pay

## 2021-04-18 NOTE — Telephone Encounter (Signed)
Chief Complaint: Sore throat/ sinus pressure/ HA Symptoms: IBID Frequency: since yesterday Pertinent Negatives: Patient denies Fever, cough - Disposition: [] ED /[] Urgent Care (no appt availability in office) / [] Appointment(In office/virtual)/ []  [facility]/ [x] Home Care/ [] Refused Recommended Disposition /[] [facility]/ []  Follow-up with PCP Additional Notes: Pt has had s/s since yesterday. Had [place] in November.     Summary: headache-sore throat   Pt called in stating she has been having a headache and a sore throat, she was not sure if it could be covid and wanted to speak with a nurse. Please advise      Reason for Disposition  [1] Sinus congestion as part of a cold AND [2] present < 10 days  Answer Assessment - Initial Assessment Questions 1. COVID-19 EXPOSURE: "Please describe how you were exposed to someone with a COVID-19 infection."     At work 2. PLACE of CONTACT: "Where were you when you were exposed to COVID-19?" (e.g., home, school, medical waiting room; which city?)     work 3. TYPE of CONTACT: "How much contact was there?" (e.g., sitting next to, live in same house, work in same office, same building)     Same floor - med aid 4. DURATION of CONTACT: "How long were you in contact with the COVID-19 patient?" (e.g., a few seconds, passed by person, a few minutes, 15 minutes or longer, live with the patient)     15 minutes 5. MASK: "Were you wearing a mask?" "Was the other person wearing a mask?" Note: wearing a mask reduces the risk of an otherwise close contact.     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 DATE of CONTACT: "When did you have contact with a COVID-19 patient?" (e.g., how many days ago)     Last week 7. COMMUNITY SPREAD: "Are there lots of cases of COVID-19 (community spread) where you live?" (See public health department website, if unsure)       na 8. SYMPTOMS: "Do you have any symptoms?" (e.g., fever, cough, breathing difficulty, loss of  taste or smell)     na 9. VACCINE: "Have you gotten the COVID-19 vaccine?" If Yes, ask: "Which one, how many shots, when did you get it?"     na 10. BOOSTER: "Have you received your COVID-19 booster?" If Yes, ask: "Which one and when did you get it?"       na 11. PREGNANCY OR POSTPARTUM: "Is there any chance you are pregnant?" "When was your last menstrual period?" "Did you deliver in the last 2 weeks?"       na 12. HIGH RISK: "Do you have any heart or lung problems?" (e.g., asthma , COPD, heart failure) "Do you have a weak immune system or other risk factors?" (e.g., HIV positive, chemotherapy, renal failure, diabetes mellitus, sickle cell anemia, obesity)       na 13. TRAVEL: "Have you traveled out of the country recently?" If Yes, ask: "When and where?"  Note: Travel becomes less relevant if there is widespread community transmission where the patient lives.       na  Answer Assessment - Initial Assessment Questions 1. LOCATION: "Where does it hurt?"      Head congestion/sore throat 2. ONSET: "When did the sinus pain start?"  (e.g., hours, days)      Yesterday 3. SEVERITY: "How bad is the pain?"   (Scale 1-10; mild, moderate or severe)   - MILD (1-3): doesn't interfere with normal activities    -  MODERATE (4-7): interferes with normal activities (e.g., work or school) or awakens from sleep   - SEVERE (8-10): excruciating pain and patient unable to do any normal activities        4 4. RECURRENT SYMPTOM: "Have you ever had sinus problems before?" If Yes, ask: "When was the last time?" and "What happened that time?"      Yes - sinus infection 5. NASAL CONGESTION: "Is the nose blocked?" If Yes, ask: "Can you open it or must you breathe through your mouth?"     Not blocked 6. NASAL DISCHARGE: "Do you have discharge from your nose?" If so ask, "What color?"     no 7. FEVER: "Do you have a fever?" If Yes, ask: "What is it, how was it measured, and when did it start?"      no 8. OTHER  SYMPTOMS: "Do you have any other symptoms?" (e.g., sore throat, cough, earache, difficulty breathing)     Sore throat - sinus pressure 9. PREGNANCY: "Is there any chance you are pregnant?" "When was your last menstrual period?"     na  Protocols used: Coronavirus (COVID-19) Exposure-A-AH, Sinus Pain or Congestion-A-AH

## 2021-06-24 NOTE — Progress Notes (Signed)
Name: Kathleen Conley   MRN: [id number]    DOB: [birth year]   Date:07/04/2021 ? ?     Progress Note ? ?Subjective ? ?Chief Complaint ? ?Follow up  ? ?HPI ? ?Major depression: she is currently on Wellbutrin XL 150 mg in am and Duloxetine and seems to be controlling symptoms, she has seasonal affective disorder but she wants to take combo year round, currently in remission, had her first gand-child, likes her job , feeling well.  ?  ?Metabolic Syndrome and Obesity: we tried giving her Trulicity but needs step therapy, she did not like  Metformin - caused diarrhea . She denies polyphagia, polyuria or polydipsia. She is only on life style modification and last A1C was 5.9 %. We gave her a rx of Rybelsus on her last visit but insurance denied coverage, she also has fatty liver on [place]   ?  ?Obesity: she has a long history of obesity, started after her divorce when she was in her 45's. She tried weight watchers but was unsuccessful, we discussed intermittent fasting again, we will try [facility]. She denies family history of thyroid cancer or personal history of pancreatitis  ?  ?FMS: she states pain is better with Cymbalta, could not tolerate Lyrica ( caused nightmares and vivid dreams) also stopped Gabapentin ( she was grinding her teeth), taking skelaxin prn now but advised to take three times daily so she can stop taking ibuprofen  ? ?Back pain : she continues to have low back pain 3/10 constant , near her tail bone, it is intermittent worse at the end of her work day, better with biofreeze , she was seeing chiropractor but stopped going.  She is taking alevel daily and advised to increase skelaxin to TID instead  ?  ?OSA: she has mild symptoms, did not qualify for CPAP , avoiding sleeping on her back,  trying to sleep  on her side. She also has shift work sleep disorder and states nuvigil helps her stay awake, she works third shift and has more energy with medication ? ?HTN: , she is currently on Bystolic and Benicar and is  doing well now, bp is at goal. She denies chest pain or palpitation or dizziness ? ?Leucocytosis and anemia: she has seen hematologist in the past and does not want to go back at this time. She has hidradenitis suppurative  we will recheck labs today ? ?Dyslipidemia: we will recheck labs today  ? ?AR: she states worse this time of the year, on zyrtec daily and flonase prn  ? ? ?Patient Active Problem List  ? Diagnosis Date Noted  ? Fatty liver 07/04/2021  ? Dyslipidemia 07/04/2021  ? History of iron deficiency 07/04/2021  ? 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 Spasm of thoracic back muscle 12/13/2020  ? Morbid obesity (HCC) 04/17/2018  ? 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 Flat foot 01/28/2016  ? Aortic sclerosis 10/14/2015  ? Midline low back pain without sciatica 09/20/2015  ? Leukocytosis 11/29/2014  ? Osteoarthritis of left knee 11/20/2014  ? History of ventral hernia repair 10/27/2014  ? Allergic rhinitis 08/17/2014  ? Axillary hidradenitis suppurativa 08/17/2014  ? Baker cyst 08/17/2014  ? Chronic constipation 08/17/2014  ? Major depression in remission (HCC) 08/17/2014  ? Engages in travel abroad 08/17/2014  ? Fibromyalgia 08/17/2014  ? Cardiac murmur 08/17/2014  ? Dysmetabolic syndrome 08/17/2014  ? Plantar fasciitis 08/17/2014  ? Beat, premature ventricular 08/17/2014  ? Abnormal neurological finding suggestive of lumbar-level spinal  disorder 08/17/2014  ? Obstructive apnea 12/02/2013  ? Essential (primary) hypertension 12/09/2009  ? Hypertrichosis 12/09/2009  ? ? ?Past Surgical History:  ?Procedure Laterality Date  ? ACHILLES TENDON SURGERY Left 08/15/2019  ? Procedure: TRIPLE ARTHRODESIS AND ACHILLES TENDON LENGTHENING LEFT FOOT;  Surgeon: Gwyneth RevelsFowler, Justin, DPM;  Location: [facility] ORS;  Service: Podiatry;  Laterality: Left;  ? COLONOSCOPY WITH PROPOFOL N/A 07/23/2020  ? Procedure: COLONOSCOPY WITH PROPOFOL;  Surgeon: Pasty Spillersahiliani, Varnita B, MD;  Location: [facility];  Service: Endoscopy;   Laterality: N/A;  ? FOOT ARTHRODESIS Right 12/01/2016  ? Procedure: ARTHRODESIS FOOT; TRIPLE;  Surgeon: Gwyneth RevelsFowler, Justin, DPM;  Location: [facility] ORS;  Service: Podiatry;  Laterality: Right;  ? HERNIA REPAIR  10/09/11  ? Ventral Incarcerated- [name]  ? OOPHORECTOMY Left 12/09/2009  ? TUBAL LIGATION  1993  ? VENTRAL HERNIA REPAIR  10/09/2011  ? ? ?Family History  ?Problem Relation Age of Onset  ? Hypertension Mother   ? CVA Mother   ? Kidney disease Mother   ? Congenital heart disease Mother   ? Heart disease Father   ? Hypertension Father   ? Diabetes Father   ? Breast cancer Paternal Grandmother   ?     Bilateral  ? Colon cancer Maternal Grandfather   ? Colon cancer Maternal Uncle   ? ? ?Social History  ? ?Tobacco Use  ? Smoking status: Never  ? Smokeless tobacco: Never  ?Substance Use Topics  ? Alcohol use: No  ?  Alcohol/week: 0.0 standard drinks  ? ? ? ?Current Outpatient Medications:  ?  acetaminophen (TYLENOL) 500 MG tablet, Take 1 tablet (500 mg total) by mouth every 6 (six) hours as needed. Max of 3 grams daily or 6 pills dialy (Patient taking differently: Take 1,000 mg by mouth every 6 (six) hours as needed for moderate pain or headache.), Disp: 30 tablet, Rfl: 0 ?  Armodafinil (NUVIGIL) 150 MG tablet, Take 1 tablet (150 mg total) by mouth daily as needed (shift work--sleepiness)., Disp: 30 tablet, Rfl: 2 ?  aspirin EC 81 MG tablet, Take 1 tablet (81 mg total) by mouth daily. (Patient taking differently: Take 81 mg by mouth every evening.), Disp: 30 tablet, Rfl: 0 ?  bisacodyl (DULCOLAX) 5 MG EC tablet, Take 1 tablet (5 mg total) by mouth daily as needed for moderate constipation., Disp: 30 tablet, Rfl: 1 ?  buPROPion (WELLBUTRIN XL) 150 MG 24 hr tablet, Take 1 tablet (150 mg total) by mouth daily., Disp: 30 tablet, Rfl: 2 ?  cetirizine (ZYRTEC) 10 MG tablet, Take 10 mg by mouth every evening., Disp: , Rfl:  ?  Cholecalciferol (VITAMIN D) 2000 UNITS tablet, Take 2,000 Units by mouth every evening. , Disp: , Rfl:   ?  docusate sodium (COLACE) 100 MG capsule, Take 100 mg by mouth daily as needed for moderate constipation. , Disp: , Rfl:  ?  DULoxetine (CYMBALTA) 60 MG capsule, Take 1 capsule (60 mg total) by mouth every evening., Disp: 30 capsule, Rfl: 2 ?  ferrous sulfate 325 (65 FE) MG tablet, Take 325 mg by mouth every evening. , Disp: , Rfl:  ?  fluticasone (FLONASE) 50 MCG/ACT nasal spray, Place 2 sprays into both nostrils daily as needed for allergies., Disp: 16 g, Rfl: 1 ?  levonorgestrel (MIRENA) 20 MCG/24HR IUD, 1 each by Intrauterine route once. , Disp: , Rfl:  ?  metaxalone (SKELAXIN) 800 MG tablet, Take 1 tablet (800 mg total) by mouth daily as needed for muscle spasms., Disp: 30  tablet, Rfl: 2 ?  Multiple Vitamin (MULTIVITAMIN WITH MINERALS) TABS tablet, Take 1 tablet by mouth every evening., Disp: , Rfl:  ?  nebivolol (BYSTOLIC) 5 MG tablet, Take 1 tablet (5 mg total) by mouth daily., Disp: 30 tablet, Rfl: 2 ?  olmesartan (BENICAR) 40 MG tablet, Take 1 tablet (40 mg total) by mouth daily., Disp: 30 tablet, Rfl: 2 ?  TRAVATAN Z 0.004 % SOLN ophthalmic solution, Place 1 drop into both eyes at bedtime., Disp: , Rfl: 3 ? ?Allergies  ?Allergen Reactions  ? Lisinopril Cough  ?   ?  ? Norvasc [Amlodipine Besylate]   ? Hctz [Hydrochlorothiazide] Other (See Comments)  ?  Burning sensation on her feet   ? ? ?I personally reviewed active problem list, medication list, allergies, family history, social history with the patient/caregiver today. ? ? ?ROS ? ?Constitutional: Negative for fever or weight change.  ?Respiratory: Negative for cough and shortness of breath.   ?Cardiovascular: Negative for chest pain or palpitations.  ?Gastrointestinal: Negative for abdominal pain, no bowel changes.  ?Musculoskeletal: Negative for gait problem or joint swelling.  ?Skin: Negative for rash.  ?Neurological: Negative for dizziness or headache.  ?No other specific complaints in a complete review of systems (except as listed in HPI above).   ? ?Objective ? ?Vitals:  ? 07/04/21 1045  ?BP: 136/74  ?Pulse: 86  ?Resp: 18  ?Temp: 98.8 ?F (37.1 ?C)  ?TempSrc: Oral  ?SpO2: 97%  ?Weight: 236 lb 4.8 oz (107.2 kg)  ?Height: 5\' 2"  (1.575 m)  ? ? ?Body

## 2021-06-28 ENCOUNTER — Ambulatory Visit: Payer: Self-pay | Admitting: Family Medicine

## 2021-07-04 ENCOUNTER — Ambulatory Visit: Payer: 59 | Admitting: Family Medicine

## 2021-07-04 ENCOUNTER — Encounter: Payer: Self-pay | Admitting: Family Medicine

## 2021-07-04 VITALS — BP 136/74 | HR 86 | Temp 98.8°F | Resp 18 | Ht 62.0 in | Wt 236.3 lb

## 2021-07-04 DIAGNOSIS — G4726 Circadian rhythm sleep disorder, shift work type: Secondary | ICD-10-CM

## 2021-07-04 DIAGNOSIS — M797 Fibromyalgia: Secondary | ICD-10-CM

## 2021-07-04 DIAGNOSIS — K760 Fatty (change of) liver, not elsewhere classified: Secondary | ICD-10-CM | POA: Insufficient documentation

## 2021-07-04 DIAGNOSIS — F325 Major depressive disorder, single episode, in full remission: Secondary | ICD-10-CM

## 2021-07-04 DIAGNOSIS — E8881 Metabolic syndrome: Secondary | ICD-10-CM

## 2021-07-04 DIAGNOSIS — E785 Hyperlipidemia, unspecified: Secondary | ICD-10-CM

## 2021-07-04 DIAGNOSIS — G4733 Obstructive sleep apnea (adult) (pediatric): Secondary | ICD-10-CM

## 2021-07-04 DIAGNOSIS — Z8639 Personal history of other endocrine, nutritional and metabolic disease: Secondary | ICD-10-CM | POA: Insufficient documentation

## 2021-07-04 DIAGNOSIS — J302 Other seasonal allergic rhinitis: Secondary | ICD-10-CM

## 2021-07-04 DIAGNOSIS — Z1231 Encounter for screening mammogram for malignant neoplasm of breast: Secondary | ICD-10-CM

## 2021-07-04 DIAGNOSIS — I10 Essential (primary) hypertension: Secondary | ICD-10-CM

## 2021-07-04 MED ORDER — BUPROPION HCL ER (XL) 150 MG PO TB24: 150.0000 mg | ORAL_TABLET | Freq: Every day | ORAL | 2 refills | Status: DC

## 2021-07-04 MED ORDER — ARMODAFINIL 150 MG PO TABS: 150.0000 mg | ORAL_TABLET | Freq: Every day | ORAL | 2 refills | Status: DC | PRN

## 2021-07-04 MED ORDER — OLMESARTAN MEDOXOMIL 40 MG PO TABS: 40.0000 mg | ORAL_TABLET | Freq: Every day | ORAL | 2 refills | Status: DC

## 2021-07-04 MED ORDER — WEGOVY 0.25 MG/0.5ML ~~LOC~~ SOAJ: 0.2500 mg | SUBCUTANEOUS | 0 refills | Status: DC

## 2021-07-04 MED ORDER — NEBIVOLOL HCL 5 MG PO TABS: 5.0000 mg | ORAL_TABLET | Freq: Every day | ORAL | 2 refills | Status: DC

## 2021-07-04 MED ORDER — FLUTICASONE PROPIONATE 50 MCG/ACT NA SUSP: 2.0000 | Freq: Every day | NASAL | 1 refills | Status: DC | PRN

## 2021-07-04 MED ORDER — DULOXETINE HCL 60 MG PO CPEP: 60.0000 mg | ORAL_CAPSULE | Freq: Every evening | ORAL | 2 refills | Status: DC

## 2021-07-04 MED ORDER — METAXALONE 800 MG PO TABS: 800.0000 mg | ORAL_TABLET | Freq: Every day | ORAL | 2 refills | Status: DC | PRN

## 2021-07-05 LAB — LIPID PANEL
Cholesterol: 197 mg/dL (ref <200)
HDL: 45 mg/dL — ABNORMAL LOW (ref >=50)
LDL Cholesterol (Calc): 121 mg/dL (calc) — ABNORMAL HIGH
Non-HDL Cholesterol (Calc): 152 mg/dL (calc) — ABNORMAL HIGH (ref <130)
Total CHOL/HDL Ratio: 4.4 (calc) (ref <5.0)
Triglycerides: 193 mg/dL — ABNORMAL HIGH (ref <150)

## 2021-07-05 LAB — IRON,TIBC AND FERRITIN PANEL
%SAT: 18 % (calc) (ref 16–45)
Ferritin: 173 ng/mL (ref 16–232)
Iron: 65 ug/dL (ref 45–160)
TIBC: 363 mcg/dL (calc) (ref 250–450)

## 2021-07-05 LAB — CBC WITH DIFFERENTIAL/PLATELET
Absolute Monocytes: 802 cells/uL (ref 200–950)
Basophils Absolute: 45 cells/uL (ref 0–200)
Basophils Relative: 0.4 %
Eosinophils Absolute: 260 cells/uL (ref 15–500)
Eosinophils Relative: 2.3 %
HCT: 39.2 % (ref 35.0–45.0)
Hemoglobin: 12.6 g/dL (ref 11.7–15.5)
Lymphs Abs: 3288 cells/uL (ref 850–3900)
MCH: 27.2 pg (ref 27.0–33.0)
MCHC: 32.1 g/dL (ref 32.0–36.0)
MCV: 84.5 fL (ref 80.0–100.0)
MPV: 9 fL (ref 7.5–12.5)
Monocytes Relative: 7.1 %
Neutro Abs: 6904 cells/uL (ref 1500–7800)
Neutrophils Relative %: 61.1 %
Platelets: 338 10*3/uL (ref 140–400)
RBC: 4.64 10*6/uL (ref 3.80–5.10)
RDW: 14.3 % (ref 11.0–15.0)
Total Lymphocyte: 29.1 %
WBC: 11.3 10*3/uL — ABNORMAL HIGH (ref 3.8–10.8)

## 2021-07-05 LAB — COMPLETE METABOLIC PANEL WITH GFR
AG Ratio: 1.5 (calc) (ref 1.0–2.5)
ALT: 14 U/L (ref 6–29)
AST: 14 U/L (ref 10–35)
Albumin: 4.1 g/dL (ref 3.6–5.1)
Alkaline phosphatase (APISO): 81 U/L (ref 37–153)
BUN: 12 mg/dL (ref 7–25)
CO2: 34 mmol/L — ABNORMAL HIGH (ref 20–32)
Calcium: 9.2 mg/dL (ref 8.6–10.4)
Chloride: 104 mmol/L (ref 98–110)
Creat: 0.61 mg/dL (ref 0.50–1.03)
Globulin: 2.8 g/dL (calc) (ref 1.9–3.7)
Glucose, Bld: 103 mg/dL — ABNORMAL HIGH (ref 65–99)
Potassium: 4 mmol/L (ref 3.5–5.3)
Sodium: 144 mmol/L (ref 135–146)
Total Bilirubin: 0.3 mg/dL (ref 0.2–1.2)
Total Protein: 6.9 g/dL (ref 6.1–8.1)
eGFR: 108 mL/min/{1.73_m2} (ref >=60)

## 2021-07-05 LAB — HEMOGLOBIN A1C
Hgb A1c MFr Bld: 6 % of total Hgb — ABNORMAL HIGH (ref <5.7)
Mean Plasma Glucose: 126 mg/dL
eAG (mmol/L): 7 mmol/L

## 2021-07-28 ENCOUNTER — Other Ambulatory Visit: Payer: Self-pay | Admitting: Family Medicine

## 2021-07-28 DIAGNOSIS — Z1231 Encounter for screening mammogram for malignant neoplasm of breast: Secondary | ICD-10-CM

## 2021-08-01 ENCOUNTER — Other Ambulatory Visit (HOSPITAL_COMMUNITY)
Admission: RE | Admit: 2021-08-01 | Discharge: 2021-08-01 | Disposition: A | Discharge status: Home / Self Care | Payer: 59 | Source: Ambulatory Visit | Attending: Family Medicine | Admitting: Family Medicine

## 2021-08-01 ENCOUNTER — Encounter: Payer: Self-pay | Admitting: Family Medicine

## 2021-08-01 ENCOUNTER — Ambulatory Visit (INDEPENDENT_AMBULATORY_CARE_PROVIDER_SITE_OTHER): Payer: 59 | Admitting: Family Medicine

## 2021-08-01 VITALS — BP 132/86 | Temp 99.2°F | Resp 16 | Ht 62.0 in | Wt 236.0 lb

## 2021-08-01 DIAGNOSIS — Z01419 Encounter for gynecological examination (general) (routine) without abnormal findings: Secondary | ICD-10-CM | POA: Diagnosis present

## 2021-08-01 DIAGNOSIS — R87612 Low grade squamous intraepithelial lesion on cytologic smear of cervix (LGSIL): Secondary | ICD-10-CM | POA: Diagnosis present

## 2021-08-01 DIAGNOSIS — Z01411 Encounter for gynecological examination (general) (routine) with abnormal findings: Secondary | ICD-10-CM | POA: Diagnosis not present

## 2021-08-01 DIAGNOSIS — N912 Amenorrhea, unspecified: Secondary | ICD-10-CM | POA: Diagnosis not present

## 2021-08-01 NOTE — Progress Notes (Signed)
   GYNECOLOGY ANNUAL PREVENTATIVE CARE ENCOUNTER NOTE  Subjective:   Kathleen Conley is a 51 y.o. G3P3 female here for a routine annual gynecologic exam.  Current complaints:   LSIL in 2021 with Dr Georgianne Fick-- I do not see a repeat pap or colpo after this result Patient reports IUD in place for about 7 years. Had it placed for heavy menses Currently has irregular spotting  Denies abnormal vaginal bleeding, discharge, pelvic pain, problems with intercourse or other gynecologic concerns.    Gynecologic History No LMP recorded. (Menstrual status: IUD).-- placed about 7 years ago Contraception: IUD Last Pap: 2021. Results were: LSIL Last mammogram: PCP ordered.   Health Maintenance Due  Topic Date Due   MAMMOGRAM  10/20/2020    The following portions of the patient's history were reviewed and updated as appropriate: allergies, current medications, past family history, past medical history, past social history, past surgical history and problem list.  Review of Systems Pertinent items are noted in HPI.   Objective:  BP 132/86   Temp 99.2 F (37.3 C) (Oral)   Resp 16   Ht 5\' 2"  (1.575 m)   Wt 236 lb (107 kg)   BMI 43.16 kg/m  CONSTITUTIONAL: Well-developed, well-nourished female in no acute distress.  HENT:  Normocephalic, atraumatic, External right and left ear normal. Oropharynx is clear and moist EYES:  No scleral icterus.  NECK: Normal range of motion, supple, no masses.  Normal thyroid.  SKIN: Skin is warm and dry. No rash noted. Not diaphoretic. No erythema. No pallor. NEUROLOGIC: Alert and oriented to person, place, and time. Normal reflexes, muscle tone coordination. No cranial nerve deficit noted. PSYCHIATRIC: Normal mood and affect. Normal behavior. Normal judgment and thought content. CARDIOVASCULAR: Normal heart rate noted, regular rhythm. 2+ distal pulses. RESPIRATORY: Effort and breath sounds normal, no problems with respiration noted. BREASTS: Symmetric in  size. No masses, skin changes, nipple drainage, or lymphadenopathy. ABDOMEN: Soft,  no distention noted.  No tenderness, rebound or guarding.  PELVIC: Normal appearing external genitalia; Mildly atrophic vaginal mucosa and cervix. No abnormal discharge noted.  Pap smear obtained. IUD strings seen. Normal uterine size, no other palpable masses, no uterine or adnexal tenderness. MUSCULOSKELETAL: Normal range of motion.    Assessment and Plan:  1) Annual gynecologic examination with pap smear:  Will follow up results of pap smear and manage accordingly. Routine preventative health maintenance measures emphasized. Reviewed perimenopausal symptoms and management.   1. Well woman exam with routine gynecological exam - CRC screening up today - Has order for mammogram - Cytology - PAP  2. LGSIL on Pap smear of cervix Patient was unaware of LSIL on 2021 pap, discussed result and role of HPV. Reviewed likely need for colposcopy after result from today is back - Cytology - PAP  3. Amenorrhea Has irregular spotting Vaginal tissue appears atrophic FSH to confirm menopause prior to IUD removal - Follicle stimulating hormone   Please refer to After Visit Summary for other counseling recommendations.   Return in about 1 year (around 08/02/2022) for Yearly wellness exam.  Caren Macadam, MD, MPH, ABFM Attending Physician Center for [facility]

## 2021-08-02 ENCOUNTER — Encounter: Payer: Self-pay | Admitting: Family Medicine

## 2021-08-02 DIAGNOSIS — Z780 Asymptomatic menopausal state: Secondary | ICD-10-CM | POA: Insufficient documentation

## 2021-08-02 LAB — FOLLICLE STIMULATING HORMONE: FSH: 34.6 m[IU]/mL

## 2021-08-03 LAB — CYTOLOGY - PAP
Comment: NEGATIVE
Diagnosis: NEGATIVE
High risk HPV: NEGATIVE

## 2021-08-03 NOTE — Progress Notes (Signed)
Pt aware. Wants to schedule appt for IUD removal.

## 2021-08-08 ENCOUNTER — Telehealth: Payer: Self-pay

## 2021-08-08 ENCOUNTER — Telehealth: Payer: Self-pay | Admitting: Obstetrics

## 2021-08-08 NOTE — Telephone Encounter (Signed)
-----   Message from Federico Flake, MD sent at 08/06/2021  9:18 AM EDT ----- Pap negative for abnormal cells and also HPV. Next in 5 years. Yearly exam recommended

## 2021-08-08 NOTE — Telephone Encounter (Signed)
Pt coming into get her IUD removed next week and aware of pap results

## 2021-08-08 NOTE — Telephone Encounter (Signed)
Patient has appointment on 08/15/21 with Dr Okey Dupre. Left message to see if patient would like to be seen on 08/08/21

## 2021-08-15 ENCOUNTER — Ambulatory Visit (INDEPENDENT_AMBULATORY_CARE_PROVIDER_SITE_OTHER): Payer: 59 | Admitting: Obstetrics

## 2021-08-15 ENCOUNTER — Encounter: Payer: Self-pay | Admitting: Obstetrics

## 2021-08-15 VITALS — BP 138/84 | Ht 62.0 in | Wt 234.6 lb

## 2021-08-15 DIAGNOSIS — Z30432 Encounter for removal of intrauterine contraceptive device: Secondary | ICD-10-CM | POA: Diagnosis not present

## 2021-08-15 NOTE — Progress Notes (Signed)
Chief Complaint  Patient presents with   IUD Removal   Patient Kathleen Conley is an 51 y.o. year old G3P3 No LMP recorded. (Menstrual status: IUD). who presents today for IUD removal.  Patient has had the mirena in about 7 years. Has had 3 FSH all in menopausal range.  Pap is UTD.  Review of Systems  Constitutional:  Negative for activity change, appetite change, chills, diaphoresis, fatigue, fever and unexpected weight change.  HENT:  Positive for sneezing. Negative for congestion, dental problem, drooling, ear discharge, ear pain, facial swelling, hearing loss, mouth sores, nosebleeds, postnasal drip, rhinorrhea, sinus pressure, sinus pain, sore throat, tinnitus, trouble swallowing and voice change.   Eyes:  Positive for visual disturbance. Negative for photophobia, pain, discharge, redness and itching.  Respiratory:  Negative for apnea, cough, choking, chest tightness, shortness of breath, wheezing and stridor.   Cardiovascular:  Negative for chest pain, palpitations and leg swelling.  Gastrointestinal:  Positive for constipation. Negative for abdominal distention, abdominal pain, anal bleeding, blood in stool, diarrhea, nausea, rectal pain and vomiting.  Endocrine: Negative for cold intolerance, heat intolerance, polydipsia, polyphagia and polyuria.  Genitourinary:  Positive for difficulty urinating. Negative for decreased urine volume, dyspareunia, dysuria, enuresis, flank pain, frequency, genital sores, hematuria, menstrual problem, pelvic pain, urgency, vaginal bleeding, vaginal discharge and vaginal pain.  Musculoskeletal:  Positive for arthralgias. Negative for back pain, gait problem, joint swelling, myalgias, neck pain and neck stiffness.  Skin:  Negative for color change, pallor, rash and wound.  Allergic/Immunologic: Positive for environmental allergies. Negative for food allergies and immunocompromised state.  Neurological:  Negative for dizziness, tremors, seizures, syncope,  facial asymmetry, speech difficulty, weakness, light-headedness, numbness and headaches.  Hematological:  Negative for adenopathy. Does not bruise/bleed easily.  Psychiatric/Behavioral:  Positive for dysphoric mood. Negative for agitation, behavioral problems, confusion, decreased concentration, hallucinations, self-injury, sleep disturbance and suicidal ideas. The patient is nervous/anxious. The patient is not hyperactive.    BP 138/84   Ht 5\' 2"  (1.575 m)   Wt 234 lb 9.6 oz (106.4 kg)   BMI 42.91 kg/m   Procedure Note: IUD Removal  Speculum placed. IUD strings visualized. The IUD was removed intact by pulling on visible strings with ring forceps.   Impression:  Encounter for IUD removal  Plan:  IUD removed today successfully    Return if symptoms worsen or fail to improve, for annual/well woman.

## 2021-09-20 ENCOUNTER — Ambulatory Visit
Admission: RE | Admit: 2021-09-20 | Discharge: 2021-09-20 | Disposition: A | Discharge status: Home / Self Care | Payer: 59 | Source: Ambulatory Visit | Attending: Family Medicine | Admitting: Family Medicine

## 2021-09-20 DIAGNOSIS — Z1231 Encounter for screening mammogram for malignant neoplasm of breast: Secondary | ICD-10-CM | POA: Insufficient documentation

## 2021-09-26 ENCOUNTER — Other Ambulatory Visit: Payer: Self-pay | Admitting: Family Medicine

## 2021-09-26 DIAGNOSIS — M797 Fibromyalgia: Secondary | ICD-10-CM

## 2021-09-26 NOTE — Telephone Encounter (Signed)
Medication Refill - Medication: Mataxalone 800 mg  patient states her rx use to say take 1 tablet three times a day not 1 tablet daily.  She has ran out early and needs a refill  Has the patient contacted their pharmacy? Yes.  They told her to call the office (Agent: If no, request that the patient contact the pharmacy for the refill. If patient does not wish to contact the pharmacy document the reason why and proceed with request.) (Agent: If yes, when and what did the pharmacy advise?)  Preferred Pharmacy (with phone number or street name): [facility] Has the patient been seen for an appointment in the last year OR does the patient have an upcoming appointment? yes  Agent: Please be advised that RX refills may take up to 3 business days. We ask that you follow-up with your pharmacy.

## 2021-09-27 NOTE — Telephone Encounter (Signed)
Requested medication (s) are due for refill today - no  Requested medication (s) are on the active medication list -yes  Future visit scheduled -yes  Last refill: 07/04/21 #90 1RF  Notes to clinic: non delegated Rx  Requested Prescriptions  Pending Prescriptions Disp Refills   metaxalone (SKELAXIN) 800 MG tablet 90 tablet 2    Sig: Take 1 tablet (800 mg total) by mouth daily as needed for muscle spasms.     Not Delegated - Analgesics:  Muscle Relaxants Failed - 09/26/2021  2:24 PM      Failed - This refill cannot be delegated      Passed - Valid encounter within last 6 months    Recent Outpatient Visits           2 months ago Major depression in remission [facility])   [facility] Alba Cory, MD   6 months ago Depression, major, recurrent, mild [facility])   [facility] Alba Cory, MD   8 months ago Foul smelling urine   [facility] Margarita Mail, DO   8 months ago COVID   [facility] Margarita Mail, DO   9 months ago Dyslipidemia   [facility] Alba Cory, MD       Future Appointments             In 1 week Alba Cory, MD [facility]   In 1 month Alba Cory, MD [facility]               Requested Prescriptions  Pending Prescriptions Disp Refills   metaxalone (SKELAXIN) 800 MG tablet 90 tablet 2    Sig: Take 1 tablet (800 mg total) by mouth daily as needed for muscle spasms.     Not Delegated - Analgesics:  Muscle Relaxants Failed - 09/26/2021  2:24 PM      Failed - This refill cannot be delegated      Passed - Valid encounter within last 6 months    Recent Outpatient Visits           2 months ago Major depression in remission [facility])   [facility] Alba Cory, MD   6 months ago Depression, major, recurrent, mild [facility])   [facility] Alba Cory, MD   8 months ago Foul smelling urine   [facility] Margarita Mail, DO   8 months ago COVID   [facility] Margarita Mail, DO   9 months ago Dyslipidemia   [facility] Alba Cory, MD       Future Appointments             In 1 week Alba Cory, MD [facility], [facility]   In 1 month Alba Cory, MD [facility]

## 2021-09-29 ENCOUNTER — Encounter: Payer: Self-pay | Admitting: Family Medicine

## 2021-09-29 ENCOUNTER — Other Ambulatory Visit: Payer: Self-pay

## 2021-09-29 DIAGNOSIS — M797 Fibromyalgia: Secondary | ICD-10-CM

## 2021-09-29 MED ORDER — METAXALONE 800 MG PO TABS
800.0000 mg | ORAL_TABLET | Freq: Three times a day (TID) | ORAL | 0 refills | Status: DC | PRN
Start: 2021-09-29 — End: 2021-10-10

## 2021-10-07 NOTE — Progress Notes (Unsigned)
Name: Kathleen Conley   MRN: [id number]    DOB: April 12, 1970   Date:10/10/2021       Progress Note  Subjective  Chief Complaint  Follow Up  HPI  Major depression: she is currently on Wellbutrin XL 150 mg in am and Duloxetine and seems to be controlling symptoms, she has seasonal affective disorder but she wants to take combo year round, currently in remission, however she has seasonal affective changes and we will send higher dose of Wellbutrin in October to cover her symptoms through winter months .    Metabolic Syndrome and Obesity/fatty liver : we tried giving her Trulicity but needs step therapy, she did not like  Metformin - caused diarrhea . She denies polyphagia, polyuria or polydipsia. She is only on life style modification and last A1C was 5.9 %. We gave her a rx of Rybelsus on her last visit but insurance denied coverage, losing weight with life style modification    Obesity: she has a long history of obesity, started after her divorce when she was in her 86's. She tried weight watchers but was unsuccessful, we discussed intermittent fasting again, insurance did not cover weight loss medication. She has changed her diet , eating healthier breakfast, avoiding fast food, walking more at work and lost 7 lbs since last visit 3 months ago    FMS: she takes Duloxetine , could not tolerate Lyrica ( caused nightmares and vivid dreams) also stopped Gabapentin ( she was grinding her teeth), taking skelaxin three times a day, no longer taking nsaid's day. She states taking muscle relaxer three times daily improves pain   Back pain : she continues to have low back pain 3-4/10 constant , near her tail bone, it is intermittent worse at the end of her work day, better with biofreeze , she was seeing chiropractor but stopped going.  She is no longer taking nsaid's daily, taking skelaxin   OSA: she has mild symptoms, did not qualify for CPAP , avoiding sleeping on her back,  trying to sleep  on her  side. She also has shift work sleep disorder and states nuvigil helps her stay awake, she works third shift and has more energy with medication  HTN: , she is currently on Engineer, site and is doing well now, bp is at goal. She denies chest pain,  palpitation or dizziness  Leucocytosis and anemia: she has seen hematologist in the past and does not want to go back at this time. She has hidradenitis suppurative  , last WBC was 11.3 that is lower than her baseline   Dyslipidemia: last LDL was 121 , HDL 45 discussed ways to improved HDL again with patient   The 10-year ASCVD risk score (Arnett DK, et al., 2019) is: 4.7%   Values used to calculate the score:     Age: 51 years     Sex: Female     Is Non-Hispanic African American: Yes     Diabetic: No     Tobacco smoker: No     Systolic Blood Pressure: 126 mmHg     Is BP treated: Yes     HDL Cholesterol: 45 mg/dL     Total Cholesterol: 197 mg/dL   AR: she states zyrtec works if she takes it daily, takes flonase prn   Patient Active Problem List   Diagnosis Date Noted   Postmenopausal 08/02/2021   Fatty liver 07/04/2021   Dyslipidemia 07/04/2021   History of iron deficiency 07/04/2021   Shift work  sleep disorder 07/04/2021   Spasm of thoracic back muscle 12/13/2020   Morbid obesity (HCC) 04/17/2018   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   Flat foot 01/28/2016   Aortic sclerosis 10/14/2015   Midline low back pain without sciatica 09/20/2015   Leukocytosis 11/29/2014   Osteoarthritis of left knee 11/20/2014   History of ventral hernia repair 10/27/2014   Allergic rhinitis 08/17/2014   Axillary hidradenitis suppurativa 08/17/2014   Baker cyst 08/17/2014   Chronic constipation 08/17/2014   Major depression in remission (HCC) 08/17/2014   Engages in travel abroad 08/17/2014   Fibromyalgia 08/17/2014   Cardiac murmur 08/17/2014   Dysmetabolic syndrome 08/17/2014   Plantar fasciitis 08/17/2014   Beat, premature  ventricular 08/17/2014   Abnormal neurological finding suggestive of lumbar-level spinal disorder 08/17/2014   Obstructive apnea 12/02/2013   Essential (primary) hypertension 12/09/2009   Hypertrichosis 12/09/2009    Past Surgical History:  Procedure Laterality Date   ACHILLES TENDON SURGERY Left 08/15/2019   Procedure: TRIPLE ARTHRODESIS AND ACHILLES TENDON LENGTHENING LEFT FOOT;  Surgeon: [name], DPM;  Location: [facility];  Service: Podiatry;  Laterality: Left;   COLONOSCOPY WITH PROPOFOL N/A 07/23/2020   Procedure: COLONOSCOPY WITH PROPOFOL;  Surgeon: Pasty Spillers, MD;  Location: [facility];  Service: Endoscopy;  Laterality: N/A;   FOOT ARTHRODESIS Right 12/01/2016   Procedure: ARTHRODESIS FOOT; TRIPLE;  Surgeon: [name], DPM;  Location: [facility];  Service: Podiatry;  Laterality: Right;   HERNIA REPAIR  10/09/11   Ventral Incarcerated- Dr. [name]   OOPHORECTOMY Left 12/09/2009   TUBAL LIGATION  1993   VENTRAL HERNIA REPAIR  10/09/2011    Family History  Problem Relation Age of Onset   Hypertension Mother    CVA Mother    Kidney disease Mother    Congenital heart disease Mother    Heart disease Father    Hypertension Father    Diabetes Father    Breast cancer Paternal Grandmother        Bilateral   Colon cancer Maternal Grandfather    Colon cancer Maternal Uncle     Social History   Tobacco Use   Smoking status: Never   Smokeless tobacco: Never  Substance Use Topics   Alcohol use: No    Alcohol/week: 0.0 standard drinks of alcohol     Current Outpatient Medications:    acetaminophen (TYLENOL) 500 MG tablet, Take 1 tablet (500 mg total) by mouth every 6 (six) hours as needed. Max of 3 grams daily or 6 pills dialy (Patient taking differently: Take 1,000 mg by mouth every 6 (six) hours as needed for moderate pain or headache.), Disp: 30 tablet, Rfl: 0   Armodafinil (NUVIGIL) 150 MG tablet, Take 1 tablet (150 mg total) by mouth daily as needed (shift  work--sleepiness)., Disp: 30 tablet, Rfl: 2   aspirin EC 81 MG tablet, Take 1 tablet (81 mg total) by mouth daily., Disp: 30 tablet, Rfl: 0   buPROPion (WELLBUTRIN XL) 150 MG 24 hr tablet, Take 1 tablet (150 mg total) by mouth daily., Disp: 30 tablet, Rfl: 2   cetirizine (ZYRTEC) 10 MG tablet, Take 10 mg by mouth every evening., Disp: , Rfl:    Cholecalciferol (VITAMIN D) 2000 UNITS tablet, Take 2,000 Units by mouth every evening. , Disp: , Rfl:    docusate sodium (COLACE) 100 MG capsule, Take 100 mg by mouth daily as needed for moderate constipation. , Disp: , Rfl:    DULoxetine (CYMBALTA) 60 MG capsule, Take 1  capsule (60 mg total) by mouth every evening., Disp: 30 capsule, Rfl: 2   ferrous sulfate 325 (65 FE) MG tablet, Take 325 mg by mouth every evening. , Disp: , Rfl:    fluticasone (FLONASE) 50 MCG/ACT nasal spray, Place 2 sprays into both nostrils daily as needed for allergies., Disp: 16 g, Rfl: 1   levonorgestrel (MIRENA) 20 MCG/24HR IUD, 1 each by Intrauterine route once. , Disp: , Rfl:    metaxalone (SKELAXIN) 800 MG tablet, Take 1 tablet (800 mg total) by mouth 3 (three) times daily as needed for muscle spasms., Disp: 90 tablet, Rfl: 0   Multiple Vitamin (MULTIVITAMIN WITH MINERALS) TABS tablet, Take 1 tablet by mouth every evening., Disp: , Rfl:    nebivolol (BYSTOLIC) 5 MG tablet, Take 1 tablet (5 mg total) by mouth daily., Disp: 30 tablet, Rfl: 2   olmesartan (BENICAR) 40 MG tablet, Take 1 tablet (40 mg total) by mouth daily., Disp: 30 tablet, Rfl: 2   Semaglutide-Weight Management (WEGOVY) 0.25 MG/0.5ML SOAJ, Inject 0.25 mg into the skin once a week., Disp: 2 mL, Rfl: 0   TRAVATAN Z 0.004 % SOLN ophthalmic solution, Place 1 drop into both eyes at bedtime., Disp: , Rfl: 3  Allergies  Allergen Reactions   Lisinopril Cough        Norvasc [Amlodipine Besylate]    Hctz [Hydrochlorothiazide] Other (See Comments)    Burning sensation on her feet     I personally reviewed active  problem list, medication list, allergies, family history, social history, health maintenance with the patient/caregiver today.   ROS  Constitutional: Negative for fever , positive for weight change.  Respiratory: Negative for cough and shortness of breath.   Cardiovascular: Negative for chest pain or palpitations.  Gastrointestinal: Negative for abdominal pain, no bowel changes.  Musculoskeletal: Negative for gait problem or joint swelling.  Skin: Negative for rash.  Neurological: Negative for dizziness or headache.  No other specific complaints in a complete review of systems (except as listed in HPI above).   Objective  Vitals:   10/10/21 1046  BP: 126/84  Pulse: 85  Resp: 16  SpO2: 97%  Weight: 229 lb (103.9 kg)  Height: 5\' 2"  (1.575 m)    Body mass index is 41.88 kg/m.  Physical Exam  Constitutional: Patient appears well-developed and well-nourished. Obese  No distress.  HEENT: head atraumatic, normocephalic, pupils equal and reactive to light, neck supple, throat within normal limits Cardiovascular: Normal rate, regular rhythm and normal heart sounds.  No murmur heard. No BLE edema. Pulmonary/Chest: Effort normal and breath sounds normal. No respiratory distress. Abdominal: Soft.  There is no tenderness. Psychiatric: Patient has a normal mood and affect. behavior is normal. Judgment and thought content normal.   Recent Results (from the past 2160 hour(s))  Cytology - PAP     Status: None   Collection Time: 08/01/21 10:38 AM  Result Value Ref Range   High risk HPV Negative    Adequacy      Satisfactory for evaluation; transformation zone component PRESENT.   Diagnosis      - Negative for intraepithelial lesion or malignancy (NILM)   Comment Normal Reference Range HPV - Negative   Follicle stimulating hormone     Status: None   Collection Time: 08/01/21 10:55 AM  Result Value Ref Range   FSH 34.6 mIU/mL    Comment:                     Adult  Female:                        Follicular phase      3.5 -  12.5                       Ovulation phase       4.7 -  21.5                       Luteal phase          1.7 -   7.7                       Postmenopausal       25.8 - 134.8     PHQ2/9:    10/10/2021   10:46 AM 07/04/2021   10:48 AM 03/21/2021   10:46 AM 01/18/2021    9:47 AM 01/11/2021   11:58 AM  Depression screen PHQ 2/9  Decreased Interest 0 0 0 0 0  Down, Depressed, Hopeless 0 0 0 0 0  PHQ - 2 Score 0 0 0 0 0  Altered sleeping 0 0 0 0 0  Tired, decreased energy 0 0 0 0 0  Change in appetite 0 0 0 0 0  Feeling bad or failure about yourself  0 0 0 0 0  Trouble concentrating 0 0 0 0 0  Moving slowly or fidgety/restless 0 0 0 0 0  Suicidal thoughts 0 0 0 0 0  PHQ-9 Score 0 0 0 0 0  Difficult doing work/chores    Not difficult at all Not difficult at all    phq 9 is negative   Fall Risk:    10/10/2021   10:46 AM 07/04/2021   10:47 AM 03/21/2021   10:45 AM 01/18/2021    9:44 AM 01/11/2021   11:58 AM  Fall Risk   Falls in the past year? 0 0 0 0 0  Number falls in past yr: 0  0 0 0  Injury with Fall? 0  0 0 0  Risk for fall due to : No Fall Risks No Fall Risks No Fall Risks    Follow up Falls prevention discussed Falls prevention discussed Falls prevention discussed        Functional Status Survey: Is the patient deaf or have difficulty hearing?: No Does the patient have difficulty seeing, even when wearing glasses/contacts?: No Does the patient have difficulty concentrating, remembering, or making decisions?: No Does the patient have difficulty walking or climbing stairs?: Yes Does the patient have difficulty dressing or bathing?: No Does the patient have difficulty doing errands alone such as visiting a doctor's office or shopping?: No    Assessment & Plan  1. Fibromyalgia  - Armodafinil (NUVIGIL) 150 MG tablet; Take 1 tablet (150 mg total) by mouth daily as needed (shift work--sleepiness).  Dispense: 30 tablet; Refill:  5 - DULoxetine (CYMBALTA) 60 MG capsule; Take 1 capsule (60 mg total) by mouth every evening.  Dispense: 30 capsule; Refill: 5 - metaxalone (SKELAXIN) 800 MG tablet; Take 1 tablet (800 mg total) by mouth 3 (three) times daily as needed for muscle spasms.  Dispense: 90 tablet; Refill: 5  2. Obstructive apnea  - Armodafinil (NUVIGIL) 150 MG tablet; Take 1 tablet (150 mg total) by mouth daily as needed (shift work--sleepiness).  Dispense: 30 tablet; Refill: 5  3. Shift work sleep disorder  -  Armodafinil (NUVIGIL) 150 MG tablet; Take 1 tablet (150 mg total) by mouth daily as needed (shift work--sleepiness).  Dispense: 30 tablet; Refill: 5  4. Major depression in remission (HCC)  - buPROPion (WELLBUTRIN XL) 150 MG 24 hr tablet; Take 1 tablet (150 mg total) by mouth daily.  Dispense: 30 tablet; Refill: 2 - DULoxetine (CYMBALTA) 60 MG capsule; Take 1 capsule (60 mg total) by mouth every evening.  Dispense: 30 capsule; Refill: 5  5. Essential (primary) hypertension  - nebivolol (BYSTOLIC) 5 MG tablet; Take 1 tablet (5 mg total) by mouth daily.  Dispense: 30 tablet; Refill: 5 - olmesartan (BENICAR) 40 MG tablet; Take 1 tablet (40 mg total) by mouth daily.  Dispense: 30 tablet; Refill: 5    6. Morbid obesity (HCC)  Continue healthy diet and exericse   7. Seasonal allergic rhinitis, unspecified trigger  Doing well now   8. Dysmetabolic syndrome   9. Dyslipidemia  Discussed ASCVD  10. Seasonal affective disorder (HCC)   We will increase wellbutrin XL to 300 mg this fall

## 2021-10-10 ENCOUNTER — Encounter: Payer: Self-pay | Admitting: Family Medicine

## 2021-10-10 ENCOUNTER — Ambulatory Visit: Payer: 59 | Admitting: Family Medicine

## 2021-10-10 DIAGNOSIS — M797 Fibromyalgia: Secondary | ICD-10-CM | POA: Diagnosis not present

## 2021-10-10 DIAGNOSIS — F338 Other recurrent depressive disorders: Secondary | ICD-10-CM

## 2021-10-10 DIAGNOSIS — G4726 Circadian rhythm sleep disorder, shift work type: Secondary | ICD-10-CM

## 2021-10-10 DIAGNOSIS — G4733 Obstructive sleep apnea (adult) (pediatric): Secondary | ICD-10-CM

## 2021-10-10 DIAGNOSIS — F325 Major depressive disorder, single episode, in full remission: Secondary | ICD-10-CM

## 2021-10-10 DIAGNOSIS — E785 Hyperlipidemia, unspecified: Secondary | ICD-10-CM

## 2021-10-10 DIAGNOSIS — I10 Essential (primary) hypertension: Secondary | ICD-10-CM

## 2021-10-10 DIAGNOSIS — J302 Other seasonal allergic rhinitis: Secondary | ICD-10-CM

## 2021-10-10 DIAGNOSIS — E8881 Metabolic syndrome: Secondary | ICD-10-CM

## 2021-10-10 MED ORDER — ARMODAFINIL 150 MG PO TABS: 150.0000 mg | ORAL_TABLET | Freq: Every day | ORAL | 5 refills | Status: DC | PRN

## 2021-10-10 MED ORDER — NEBIVOLOL HCL 5 MG PO TABS: 5.0000 mg | ORAL_TABLET | Freq: Every day | ORAL | 5 refills | Status: DC

## 2021-10-10 MED ORDER — BUPROPION HCL ER (XL) 150 MG PO TB24: 150.0000 mg | ORAL_TABLET | Freq: Every day | ORAL | 2 refills | Status: DC

## 2021-10-10 MED ORDER — OLMESARTAN MEDOXOMIL 40 MG PO TABS: 40.0000 mg | ORAL_TABLET | Freq: Every day | ORAL | 5 refills | Status: DC

## 2021-10-10 MED ORDER — DULOXETINE HCL 60 MG PO CPEP: 60.0000 mg | ORAL_CAPSULE | Freq: Every evening | ORAL | 5 refills | Status: DC

## 2021-10-10 MED ORDER — METAXALONE 800 MG PO TABS
800.0000 mg | ORAL_TABLET | Freq: Three times a day (TID) | ORAL | 5 refills | Status: DC | PRN
Start: 2021-10-10 — End: 2022-03-13

## 2021-11-07 ENCOUNTER — Encounter: Payer: Self-pay | Admitting: Family Medicine

## 2021-11-09 ENCOUNTER — Telehealth (INDEPENDENT_AMBULATORY_CARE_PROVIDER_SITE_OTHER): Payer: 59 | Admitting: Nurse Practitioner

## 2021-11-09 ENCOUNTER — Other Ambulatory Visit: Payer: Self-pay

## 2021-11-09 ENCOUNTER — Encounter: Payer: Self-pay | Admitting: Nurse Practitioner

## 2021-11-09 DIAGNOSIS — U071 COVID-19: Secondary | ICD-10-CM

## 2021-11-09 MED ORDER — ALBUTEROL SULFATE HFA 108 (90 BASE) MCG/ACT IN AERS: 2.0000 | INHALATION_SPRAY | Freq: Four times a day (QID) | RESPIRATORY_TRACT | 0 refills | Status: DC | PRN

## 2021-11-09 MED ORDER — NIRMATRELVIR/RITONAVIR (PAXLOVID)TABLET: 3.0000 | ORAL_TABLET | Freq: Two times a day (BID) | ORAL | 0 refills | Status: AC

## 2021-11-09 MED ORDER — BENZONATATE 100 MG PO CAPS: 200.0000 mg | ORAL_CAPSULE | Freq: Three times a day (TID) | ORAL | 0 refills | Status: DC | PRN

## 2021-11-09 NOTE — Progress Notes (Signed)
Name: Kathleen Conley   MRN: [id number]    DOB: April 17, 1970   Date:11/09/2021       Progress Note  Subjective  Chief Complaint  Chief Complaint  Patient presents with   Covid Positive    Cough, congested, headace, sore throat for 3 days    I connected with  Elvera Maria  on 11/09/21 at 10:00 am by a video enabled telemedicine application and verified that I am speaking with the correct person using two identifiers.  I discussed the limitations of evaluation and management by telemedicine and the availability of in person appointments. The patient expressed understanding and agreed to proceed with a virtual visit  Staff also discussed with the patient that there may be a patient responsible charge related to this service. Patient Location: home Provider Location: [facility] Additional Individuals present: alone  HPI  Covid 19: Patient reports symptoms started about 3 days ago.  Patient complaining of cough, congestion, headache and sore throat.  Patient states she took a home COVID test which was positive.  Patient denies any fever or shortness of breath. Patient reports she is wheezing.  Patient is currently taking zyrtec, flonase and robitussin.  She also reports she is taking vitamin D, vitamin C and zinc.  Recommend she start taking Mucinex.  Discussed that she is within the window of COVID treatment with Paxlovid.  Discussed side effects and administration.  Patient would like to take the treatment.  Her last GFR was 108.  We will send in prescription for Paxlovid, Tessalon Perles and albuterol inhaler.  Patient should push fluids and get rest.  If you experience any shortness of breath or chest pain seek emergency care.  Quarantine according to [facility] guidelines.  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   Spasm of  thoracic back muscle 12/13/2020   Morbid obesity (HCC) 04/17/2018   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   Flat foot 01/28/2016   Aortic sclerosis 10/14/2015   Midline low back pain without sciatica 09/20/2015   Leukocytosis 11/29/2014   Osteoarthritis of left knee 11/20/2014   History of ventral hernia repair 10/27/2014   Allergic rhinitis 08/17/2014   Axillary hidradenitis suppurativa 08/17/2014   Baker cyst 08/17/2014   Chronic constipation 08/17/2014   Major depression in remission (HCC) 08/17/2014   Engages in travel abroad 08/17/2014   Fibromyalgia 08/17/2014   Cardiac murmur 08/17/2014   Dysmetabolic syndrome 08/17/2014   Plantar fasciitis 08/17/2014   Beat, premature ventricular 08/17/2014   Abnormal neurological finding suggestive of lumbar-level spinal disorder 08/17/2014   Obstructive apnea 12/02/2013   Essential (primary) hypertension 12/09/2009   Hypertrichosis 12/09/2009    Social History   Tobacco Use   Smoking status: Never   Smokeless tobacco: Never  Substance Use Topics   Alcohol use: No    Alcohol/week: 0.0 standard drinks of alcohol     Current Outpatient Medications:    acetaminophen (TYLENOL) 500 MG tablet, Take 1 tablet (500 mg total) by mouth every 6 (six) hours as needed. Max of 3 grams daily or 6 pills dialy (Patient taking differently: Take 1,000 mg by mouth every 6 (six) hours as needed for moderate pain or headache.), Disp: 30 tablet, Rfl: 0   Armodafinil (NUVIGIL) 150 MG tablet, Take 1 tablet (150 mg total) by mouth daily as needed (shift work--sleepiness).,  Disp: 30 tablet, Rfl: 5   aspirin EC 81 MG tablet, Take 1 tablet (81 mg total) by mouth daily., Disp: 30 tablet, Rfl: 0   buPROPion (WELLBUTRIN XL) 150 MG 24 hr tablet, Take 1 tablet (150 mg total) by mouth daily., Disp: 30 tablet, Rfl: 2   cetirizine (ZYRTEC) 10 MG tablet, Take 10 mg by mouth every evening., Disp: , Rfl:    Cholecalciferol (VITAMIN D) 2000 UNITS tablet, Take 2,000  Units by mouth every evening. , Disp: , Rfl:    docusate sodium (COLACE) 100 MG capsule, Take 100 mg by mouth daily as needed for moderate constipation. , Disp: , Rfl:    DULoxetine (CYMBALTA) 60 MG capsule, Take 1 capsule (60 mg total) by mouth every evening., Disp: 30 capsule, Rfl: 5   ferrous sulfate 325 (65 FE) MG tablet, Take 325 mg by mouth every evening. , Disp: , Rfl:    fluticasone (FLONASE) 50 MCG/ACT nasal spray, Place 2 sprays into both nostrils daily as needed for allergies., Disp: 16 g, Rfl: 1   metaxalone (SKELAXIN) 800 MG tablet, Take 1 tablet (800 mg total) by mouth 3 (three) times daily as needed for muscle spasms., Disp: 90 tablet, Rfl: 5   Multiple Vitamin (MULTIVITAMIN WITH MINERALS) TABS tablet, Take 1 tablet by mouth every evening., Disp: , Rfl:    nebivolol (BYSTOLIC) 5 MG tablet, Take 1 tablet (5 mg total) by mouth daily., Disp: 30 tablet, Rfl: 5   olmesartan (BENICAR) 40 MG tablet, Take 1 tablet (40 mg total) by mouth daily., Disp: 30 tablet, Rfl: 5   TRAVATAN Z 0.004 % SOLN ophthalmic solution, Place 1 drop into both eyes at bedtime., Disp: , Rfl: 3  Allergies  Allergen Reactions   Lisinopril Cough        Norvasc [Amlodipine Besylate]    Hctz [Hydrochlorothiazide] Other (See Comments)    Burning sensation on her feet     I personally reviewed active problem list, medication list, allergies with the patient/caregiver today.  ROS  Constitutional: Negative for fever or weight change.  HEENT: positive for nasal congestion and sore throat Respiratory: positive for cough and negative for shortness of breath.   Cardiovascular: Negative for chest pain or palpitations.  Gastrointestinal: Negative for abdominal pain, no bowel changes.  Musculoskeletal: Negative for gait problem or joint swelling.  Skin: Negative for rash.  Neurological: Negative for dizziness , positive for  headache.  No other specific complaints in a complete review of systems (except as listed in  HPI above).   Objective  Virtual encounter, vitals not obtained.  There is no height or weight on file to calculate BMI.  Nursing Note and Vital Signs reviewed.  Physical Exam  Awake, alert and oriented speaking in complete sentences  No results found for this or any previous visit (from the past 72 hour(s)).  Assessment & Plan  1. COVID-19 Continue taking vitamin C, vitamin D and zinc Continue taking Zyrtec and Flonase and Robitussin Please add on Mucinex Push fluids and get rest Quarantine according to [employer] guidelines Start taking Paxlovid, Tessalon Perles and albuterol inhaler. Seek emergency care if any shortness of breath or chest pain. - albuterol (VENTOLIN HFA) 108 (90 Base) MCG/ACT inhaler; Inhale 2 puffs into the lungs every 6 (six) hours as needed for wheezing or shortness of breath.  Dispense: 8 g; Refill: 0 - benzonatate (TESSALON) 100 MG capsule; Take 2 capsules (200 mg total) by mouth 3 (three) times daily as needed for cough.  Dispense:  20 capsule; Refill: 0 - nirmatrelvir/ritonavir EUA (PAXLOVID) 20 x 150 MG & 10 x 100MG  TABS; Take 3 tablets by mouth 2 (two) times daily for 5 days. (Take nirmatrelvir 150 mg two tablets twice daily for 5 days and ritonavir 100 mg one tablet twice daily for 5 days) Patient GFR is 108  Dispense: 30 tablet; Refill: 0   -Red flags and when to present for emergency care or RTC including fever >101.29F, chest pain, shortness of breath, new/worsening/un-resolving symptoms,  reviewed with patient at time of visit. Follow up and care instructions discussed and provided in AVS. - I discussed the assessment and treatment plan with the patient. The patient was provided an opportunity to ask questions and all were answered. The patient agreed with the plan and demonstrated an understanding of the instructions.  I provided 15 minutes of non-face-to-face time during this encounter.  , FNP

## 2021-11-18 NOTE — Progress Notes (Deleted)
Name: Kathleen Conley   MRN: [id number]    DOB: 29-Feb-1970   Date:11/18/2021       Progress Note  Subjective  Chief Complaint  Annual Exam  HPI  Patient presents for annual CPE.  Diet: *** Exercise: ***  Last Eye Exam: *** Last Dental Exam: ***  Flowsheet Row Video Visit from 11/09/2021 in [facility]  AUDIT-C Score 0      Depression: Phq 9 is  {Desc; negative/positive:13464}    11/09/2021    9:44 AM 10/10/2021   10:46 AM 07/04/2021   10:48 AM 03/21/2021   10:46 AM 01/18/2021    9:47 AM  Depression screen PHQ 2/9  Decreased Interest 0 0 0 0 0  Down, Depressed, Hopeless 0 0 0 0 0  PHQ - 2 Score 0 0 0 0 0  Altered sleeping  0 0 0 0  Tired, decreased energy  0 0 0 0  Change in appetite  0 0 0 0  Feeling bad or failure about yourself   0 0 0 0  Trouble concentrating  0 0 0 0  Moving slowly or fidgety/restless  0 0 0 0  Suicidal thoughts  0 0 0 0  PHQ-9 Score  0 0 0 0  Difficult doing work/chores     Not difficult at all   Hypertension: BP Readings from Last 3 Encounters:  10/10/21 126/84  08/15/21 138/84  08/01/21 132/86   Obesity: Wt Readings from Last 3 Encounters:  10/10/21 229 lb (103.9 kg)  08/15/21 234 lb 9.6 oz (106.4 kg)  08/01/21 236 lb (107 kg)   BMI Readings from Last 3 Encounters:  10/10/21 41.88 kg/m  08/15/21 42.91 kg/m  08/01/21 43.16 kg/m     Vaccines:   HPV: N/A Tdap: up to date Shingrix: N/A Pneumonia: N/A Flu: due COVID-19: N/A   Hep C Screening: 05/24/20 STD testing and prevention (HIV/chl/gon/syphilis): 12/01/16 Intimate partner violence: negative screen  Sexual History : Menstrual History/LMP/Abnormal Bleeding:  Discussed importance of follow up if any post-menopausal bleeding: {Response; yes/no/na:63}  Incontinence Symptoms: negative for symptoms   Breast cancer:  - Last Mammogram: 09/20/21 - BRCA gene screening: N/A  Osteoporosis Prevention : Discussed high calcium and vitamin D supplementation,  weight bearing exercises Bone density: N/A   Cervical cancer screening: 08/01/21  Skin cancer: Discussed monitoring for atypical lesions  Colorectal cancer: 07/23/20   Lung cancer:  Low Dose CT Chest recommended if Age 54-80 years, 20 pack-year currently smoking OR have quit w/in 15years. Patient does not qualify for screen   ECG: 08/08/19  Advanced Care Planning: A voluntary discussion about advance care planning including the explanation and discussion of advance directives.  Discussed health care proxy and Living will, and the patient was able to identify a health care proxy as ***.  Patient does not have a living will and power of attorney of health care   Lipids: Lab Results  Component Value Date   CHOL 197 07/04/2021   CHOL 189 05/24/2020   CHOL 203 (H) 10/18/2018   Lab Results  Component Value Date   HDL 45 (L) 07/04/2021   HDL 46 (L) 05/24/2020   HDL 45 (L) 10/18/2018   Lab Results  Component Value Date   LDLCALC 121 (H) 07/04/2021   LDLCALC 109 (H) 05/24/2020   LDLCALC 127 (H) 10/18/2018   Lab Results  Component Value Date   TRIG 193 (H) 07/04/2021   TRIG 225 (H) 05/24/2020   TRIG 185 (H) 10/18/2018  Lab Results  Component Value Date   CHOLHDL 4.4 07/04/2021   CHOLHDL 4.1 05/24/2020   CHOLHDL 4.5 10/18/2018   No results found for: "LDLDIRECT"  Glucose: Glucose  Date Value Ref Range Status  11/01/2012 92 65 - 99 mg/dL Final  10/07/2011 101 (H) 65 - 99 mg/dL Final  10/05/2011 210 (H) 65 - 99 mg/dL Final   Glucose, Bld  Date Value Ref Range Status  07/04/2021 103 (H) 65 - 99 mg/dL Final    Comment:    .            Fasting reference interval . For someone without known diabetes, a glucose value between 100 and 125 mg/dL is consistent with prediabetes and should be confirmed with a follow-up test. .   09/21/2020 108 (H) 70 - 99 mg/dL Final    Comment:    Glucose reference range applies only to samples taken after fasting for at least 8 hours.   05/24/2020 77 65 - 99 mg/dL Final    Comment:    .            Fasting reference interval .    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   Spasm of thoracic back muscle 12/13/2020   Morbid obesity ([place]) 04/17/2018   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   Flat foot 01/28/2016   Aortic sclerosis 10/14/2015   Midline low back pain without sciatica 09/20/2015   Leukocytosis 11/29/2014   Osteoarthritis of left knee 11/20/2014   History of ventral hernia repair 10/27/2014   Allergic rhinitis 08/17/2014   Axillary hidradenitis suppurativa 08/17/2014   Baker cyst 08/17/2014   Chronic constipation 08/17/2014   Major depression in remission ([place]) 08/17/2014   Engages in travel abroad 08/17/2014   Fibromyalgia 08/17/2014   Cardiac murmur 15/72/6203   Dysmetabolic syndrome 55/97/4163   Plantar fasciitis 08/17/2014   Beat, premature ventricular 08/17/2014   Abnormal neurological finding suggestive of lumbar-level spinal disorder 08/17/2014   Obstructive apnea 12/02/2013   Essential (primary) hypertension 12/09/2009   Hypertrichosis 12/09/2009    Past Surgical History:  Procedure Laterality Date   ACHILLES TENDON SURGERY Left 08/15/2019   Procedure: TRIPLE ARTHRODESIS AND ACHILLES TENDON LENGTHENING LEFT FOOT;  Surgeon: Samara Deist, DPM;  Location: [facility];  Service: Podiatry;  Laterality: Left;   COLONOSCOPY WITH PROPOFOL N/A 07/23/2020   Procedure: COLONOSCOPY WITH PROPOFOL;  Surgeon: Virgel Manifold, MD;  Location: [facility] END[facility];  Service: Endoscopy;  Laterality: N/A;   FOOT ARTHRODESIS Right 12/01/2016   Procedure: ARTHRODESIS FOOT; TRIPLE;  Surgeon: Samara Deist, DPM;  Location: [facility] OR[facility];  Service:  Podiatry;  Laterality: Right;   HERNIA REPAIR  10/09/11   Ventral Incarcerated- Dr. Pat Patrick   OOPHORECTOMY Left 12/09/2009   TUBAL LIGATION  1993   VENTRAL HERNIA REPAIR  10/09/2011    Family History  Problem Relation Age of Onset   Hypertension Mother    CVA Mother    Kidney disease Mother    Congenital heart disease Mother    Heart disease Father    Hypertension Father    Diabetes Father    Breast cancer Paternal Grandmother        Bilateral   Colon cancer Maternal  Grandfather    Colon cancer Maternal Uncle     Social History   Socioeconomic History   Marital status: Married    Spouse name: Not on file   Number of children: 3   Years of education: Associates   Highest education level: Associate degree: academic program  Occupational History   Occupation: LPN at the [employer]   Smoking status: Never   Smokeless tobacco: Never  Vaping Use   Vaping Use: Never used  Substance and Sexual Activity   Alcohol use: No    Alcohol/week: 0.0 standard drinks of alcohol   Drug use: No   Sexual activity: Yes    Partners: Male    Birth control/protection: I.U.D.  Other Topics Concern   Not on file  Social History Narrative   Married Moses in 2016 in [place] and he finally got visa to move to [place] Nov 2019   Social Determinants of Health   Financial Resource Strain: Low Risk  (10/10/2017)   Overall Financial Resource Strain (CARDIA)    Difficulty of Paying Living Expenses: Not hard at all  Food Insecurity: No Food Insecurity (10/10/2017)   Hunger Vital Sign    Worried About Running Out of Food in the Last Year: Never true    Ran Out of Food in the Last Year: Never true  Transportation Needs: No Transportation Needs (10/10/2017)   PRAPARE - Hydrologist (Medical): No    Lack of Transportation (Non-Medical): No  Physical Activity: Unknown (10/10/2017)   Exercise Vital Sign    Days of Exercise per Week: 0 days    Minutes of  Exercise per Session: Not on file  Stress: No Stress Concern Present (10/10/2017)   [place]    Feeling of Stress : Not at all  Social Connections: Somewhat Isolated (12/13/2017)   Social Connection and Isolation Panel [NHANES]    Frequency of Communication with Friends and Family: More than three times a week    Frequency of Social Gatherings with Friends and Family: Never    Attends Religious Services: More than 4 times per year    Active Member of [employer] or Organizations: No    Attends Archivist Meetings: Never    Marital Status: Never married  Intimate Partner Violence: Not At Risk (12/13/2017)   Humiliation, Afraid, Rape, and Kick questionnaire    Fear of Current or Ex-Partner: No    Emotionally Abused: No    Physically Abused: No    Sexually Abused: No     Current Outpatient Medications:    acetaminophen (TYLENOL) 500 MG tablet, Take 1 tablet (500 mg total) by mouth every 6 (six) hours as needed. Max of 3 grams daily or 6 pills dialy (Patient taking differently: Take 1,000 mg by mouth every 6 (six) hours as needed for moderate pain or headache.), Disp: 30 tablet, Rfl: 0   albuterol (VENTOLIN HFA) 108 (90 Base) MCG/ACT inhaler, Inhale 2 puffs into the lungs every 6 (six) hours as needed for wheezing or shortness of breath., Disp: 8 g, Rfl: 0   Armodafinil (NUVIGIL) 150 MG tablet, Take 1 tablet (150 mg total) by mouth daily as needed (shift work--sleepiness)., Disp: 30 tablet, Rfl: 5   aspirin EC 81 MG tablet, Take 1 tablet (81 mg total) by mouth daily., Disp: 30 tablet, Rfl: 0   benzonatate (TESSALON) 100 MG capsule, Take 2 capsules (200 mg total) by mouth 3 (  three) times daily as needed for cough., Disp: 20 capsule, Rfl: 0   buPROPion (WELLBUTRIN XL) 150 MG 24 hr tablet, Take 1 tablet (150 mg total) by mouth daily., Disp: 30 tablet, Rfl: 2   cetirizine (ZYRTEC) 10 MG tablet, Take 10 mg by mouth every  evening., Disp: , Rfl:    Cholecalciferol (VITAMIN D) 2000 UNITS tablet, Take 2,000 Units by mouth every evening. , Disp: , Rfl:    docusate sodium (COLACE) 100 MG capsule, Take 100 mg by mouth daily as needed for moderate constipation. , Disp: , Rfl:    DULoxetine (CYMBALTA) 60 MG capsule, Take 1 capsule (60 mg total) by mouth every evening., Disp: 30 capsule, Rfl: 5   ferrous sulfate 325 (65 FE) MG tablet, Take 325 mg by mouth every evening. , Disp: , Rfl:    fluticasone (FLONASE) 50 MCG/ACT nasal spray, Place 2 sprays into both nostrils daily as needed for allergies., Disp: 16 g, Rfl: 1   metaxalone (SKELAXIN) 800 MG tablet, Take 1 tablet (800 mg total) by mouth 3 (three) times daily as needed for muscle spasms., Disp: 90 tablet, Rfl: 5   Multiple Vitamin (MULTIVITAMIN WITH MINERALS) TABS tablet, Take 1 tablet by mouth every evening., Disp: , Rfl:    nebivolol (BYSTOLIC) 5 MG tablet, Take 1 tablet (5 mg total) by mouth daily., Disp: 30 tablet, Rfl: 5   olmesartan (BENICAR) 40 MG tablet, Take 1 tablet (40 mg total) by mouth daily., Disp: 30 tablet, Rfl: 5   TRAVATAN Z 0.004 % SOLN ophthalmic solution, Place 1 drop into both eyes at bedtime., Disp: , Rfl: 3  Allergies  Allergen Reactions   Lisinopril Cough        Norvasc [Amlodipine Besylate]    Hctz [Hydrochlorothiazide] Other (See Comments)    Burning sensation on her feet      ROS  ***  Objective  There were no vitals filed for this visit.  There is no height or weight on file to calculate BMI.  Physical Exam ***  No results found for this or any previous visit (from the past 2160 hour(s)).   Fall Risk:    11/09/2021    9:44 AM 10/10/2021   10:46 AM 07/04/2021   10:47 AM 03/21/2021   10:45 AM 01/18/2021    9:44 AM  Fall Risk   Falls in the past year? 0 0 0 0 0  Number falls in past yr: 0 0  0 0  Injury with Fall? 0 0  0 0  Risk for fall due to :  No Fall Risks No Fall Risks No Fall Risks   Follow up Falls  evaluation completed Falls prevention discussed Falls prevention discussed Falls prevention discussed      Functional Status Survey:     Assessment & Plan  1. Well adult exam ***   -USPSTF grade A and B recommendations reviewed with patient; age-appropriate recommendations, preventive care, screening tests, etc discussed and encouraged; healthy living encouraged; see AVS for patient education given to patient -Discussed importance of 150 minutes of physical activity weekly, eat two servings of fish weekly, eat one serving of tree nuts ( cashews, pistachios, pecans, almonds.Marland Kitchen) every other day, eat 6 servings of fruit/vegetables daily and drink plenty of water and avoid sweet beverages.   -Reviewed Health Maintenance: Yes.

## 2021-11-21 ENCOUNTER — Encounter: Payer: 59 | Admitting: Family Medicine

## 2021-11-21 DIAGNOSIS — Z0000 Encounter for general adult medical examination without abnormal findings: Secondary | ICD-10-CM

## 2022-01-03 ENCOUNTER — Encounter: Payer: Self-pay | Admitting: Internal Medicine

## 2022-01-03 ENCOUNTER — Ambulatory Visit: Payer: 59 | Admitting: Internal Medicine

## 2022-01-03 VITALS — BP 144/88 | HR 83 | Temp 98.2°F | Resp 18 | Ht 62.0 in | Wt 226.3 lb

## 2022-01-03 DIAGNOSIS — M62830 Muscle spasm of back: Secondary | ICD-10-CM

## 2022-01-03 DIAGNOSIS — Z23 Encounter for immunization: Secondary | ICD-10-CM | POA: Diagnosis not present

## 2022-01-03 MED ORDER — TIZANIDINE HCL 4 MG PO TABS: 4.0000 mg | ORAL_TABLET | Freq: Every day | ORAL | 0 refills | Status: DC | PRN

## 2022-01-03 MED ORDER — NAPROXEN 500 MG PO TABS: 500.0000 mg | ORAL_TABLET | Freq: Two times a day (BID) | ORAL | 0 refills | Status: AC

## 2022-01-03 NOTE — Patient Instructions (Signed)
It was great seeing you today!  Plan discussed at today's visit: -Use Naproxen 500 mg twice daily for 5 days - do NOT take other anti-inflammatories and take with food -Try new muscle relaxer but do not take with skelexin  -Try moist heat and stretches  Follow up in: as needed  Take care and let us know if you have any questions or concerns prior to your next visit.  Dr. Caralee Ates

## 2022-01-03 NOTE — Progress Notes (Signed)
Acute Office Visit  Subjective:     Patient ID: Kathleen Conley, female    DOB: [birth year], 51 y.o.   MRN: [id number]  Chief Complaint  Patient presents with   Back Pain    Upper under both shoulder blades onset sunday    HPI Patient is in today for back pain.  Pain is located under her bilateral shoulder blades.  She describes it as a "grabbing" type pain.  It started about 2 days ago.  She denies any trauma or injury.  It does not radiate.  It came on suddenly but is intermittent in frequency.  Walking and movement in general make the pain worse.  She has been alternating Tylenol and anti-inflammatories as well as resting and using a heating pad, which has been helping.  She denies any associated symptoms, no urinary symptoms or fevers.  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  Review of Systems  Constitutional:  Negative for chills and fever.  Respiratory:  Negative for shortness of breath.   Cardiovascular:  Negative for chest pain.  Genitourinary:  Negative for dysuria and urgency.  Musculoskeletal:  Positive for back pain.  Neurological:  Negative for tingling and weakness.        Objective:    BP (!) 144/88   Pulse 83   Temp 98.2 F (36.8 C)   Resp 18   Ht 5\' 2"  (1.575 m)   Wt 226 lb 4.8 oz (102.6 kg)   SpO2 96%   BMI 41.39 kg/m  BP Readings from Last 3 Encounters:  01/03/22 (!) 144/88  10/10/21 126/84  08/15/21 138/84   Wt Readings from Last 3 Encounters:  01/03/22 226 lb 4.8 oz (102.6 kg)  10/10/21 229 lb (103.9 kg)  08/15/21 234 lb 9.6 oz (106.4 kg)     Physical Exam Constitutional:      Appearance: Normal appearance.  HENT:     Head: Normocephalic and atraumatic.  Eyes:     Conjunctiva/sclera: Conjunctivae normal.  Cardiovascular:     Rate and Rhythm: Normal rate and regular rhythm.  Pulmonary:     Effort: Pulmonary effort is normal.     Breath sounds: Normal breath sounds.  Musculoskeletal:      Thoracic back: Spasms and tenderness present. Normal range of motion.  Skin:    General: Skin is warm and dry.  Neurological:     General: No focal deficit present.     Mental Status: She is alert. Mental status is at baseline.  Psychiatric:        Mood and Affect: Mood normal.        Behavior: Behavior normal.     No results found for any visits on 01/03/22.      Assessment & Plan:   1. Spasm of thoracic back muscle: Acute muscle spasms. Patient takes Skelaxin TID for fibromyalgia but will hold this and try Zanaflex instead. Will also treat with scheduled anti-inflammatories, Naproxen 500 mg BID x 5 days. Work note given. Discussed moist heat and gentle stretching as well.   - naproxen (NAPROSYN) 500 MG tablet; Take 1 tablet (500 mg total) by mouth 2 (two) times daily with a meal for 5 days.  Dispense: 10 tablet; Refill: 0 - tiZANidine (ZANAFLEX) 4 MG tablet; Take 1 tablet (4 mg total) by mouth daily as needed for muscle spasms.  Dispense: 20 tablet; Refill: 0  2. Need for influenza vaccination: Flu vaccine administered today.   -  Flu Vaccine QUAD 6+ mos PF IM (Fluarix Quad PF)  Return if symptoms worsen or fail to improve.  Margarita Mail, DO

## 2022-01-09 ENCOUNTER — Encounter: Payer: Self-pay | Admitting: Internal Medicine

## 2022-01-09 ENCOUNTER — Ambulatory Visit
Admission: RE | Admit: 2022-01-09 | Discharge: 2022-01-09 | Disposition: A | Discharge status: Home / Self Care | Payer: 59 | Attending: Internal Medicine | Admitting: Internal Medicine

## 2022-01-09 ENCOUNTER — Ambulatory Visit
Admission: RE | Admit: 2022-01-09 | Discharge: 2022-01-09 | Disposition: A | Discharge status: Home / Self Care | Payer: 59 | Source: Ambulatory Visit | Attending: Internal Medicine | Admitting: Internal Medicine

## 2022-01-09 ENCOUNTER — Ambulatory Visit: Payer: 59 | Admitting: Internal Medicine

## 2022-01-09 VITALS — BP 152/90 | HR 82 | Temp 98.5°F | Resp 16 | Ht 62.0 in | Wt 230.4 lb

## 2022-01-09 DIAGNOSIS — M546 Pain in thoracic spine: Secondary | ICD-10-CM

## 2022-01-09 DIAGNOSIS — R350 Frequency of micturition: Secondary | ICD-10-CM | POA: Diagnosis not present

## 2022-01-09 DIAGNOSIS — R319 Hematuria, unspecified: Secondary | ICD-10-CM

## 2022-01-09 LAB — POCT URINALYSIS DIPSTICK
Bilirubin, UA: NEGATIVE
Blood, UA: POSITIVE
Glucose, UA: NEGATIVE
Ketones, UA: NEGATIVE
Leukocytes, UA: NEGATIVE
Nitrite, UA: NEGATIVE
Odor: NORMAL
Protein, UA: NEGATIVE
Spec Grav, UA: 1.01 (ref 1.010–1.025)
Urobilinogen, UA: 0.2 E.U./dL
pH, UA: 6.5 (ref 5.0–8.0)

## 2022-01-09 MED ORDER — CLINDAMYCIN PHOSPHATE 1 % EX GEL: Freq: Two times a day (BID) | CUTANEOUS | 0 refills | Status: DC

## 2022-01-09 NOTE — Progress Notes (Signed)
Acute Office Visit  Subjective:     Patient ID: Kathleen Conley, female    DOB: 19-Oct-1970, 51 y.o.   MRN: [id number]  Chief Complaint  Patient presents with   Urinary Tract Infection    Back pain w/ urgency and frequency    HPI Patient is in today for back pain/concern for UTI.  She was treated in the office on 11/14 for acute spasms in the thoracic spine.  She was treated with naproxen and muscle relaxers as needed.  She states the naproxen did help and she had been pain-free for several days but then a few days ago she had an intense flare of grabbing like pain underneath her shoulder blades but more midline at this point.  She is also been having increased urinary urgency and frequency she is concerned she has a UTI today.  URINARY SYMPTOMS Dysuria: no Urinary frequency: yes Urgency: yes Small volume voids: yes Urinary incontinence: yes - at baseline but worse  Foul odor: no Hematuria: yes Abdominal pain: no Back pain: yes Suprapubic pain/pressure: no Flank pain: no Fever:  no  Review of Systems  Constitutional:  Negative for chills and fever.  Genitourinary:  Positive for frequency, hematuria and urgency. Negative for dysuria and flank pain.  Musculoskeletal:  Positive for back pain.       Objective:    BP (!) 152/90   Pulse 82   Temp 98.5 F (36.9 C)   Resp 16   Ht 5\' 2"  (1.575 m)   Wt 230 lb 6.4 oz (104.5 kg)   SpO2 98%   BMI 42.14 kg/m  BP Readings from Last 3 Encounters:  01/09/22 (!) 152/90  01/03/22 (!) 144/88  10/10/21 126/84   Wt Readings from Last 3 Encounters:  01/09/22 230 lb 6.4 oz (104.5 kg)  01/03/22 226 lb 4.8 oz (102.6 kg)  10/10/21 229 lb (103.9 kg)     Physical Exam Constitutional:      Appearance: Normal appearance.  HENT:     Head: Normocephalic and atraumatic.  Eyes:     Conjunctiva/sclera: Conjunctivae normal.  Cardiovascular:     Rate and Rhythm: Normal rate and regular rhythm.  Pulmonary:     Effort: Pulmonary  effort is normal.     Breath sounds: Normal breath sounds.  Abdominal:     Tenderness: There is no right CVA tenderness or left CVA tenderness.  Musculoskeletal:     Thoracic back: Spasms and tenderness present. Normal range of motion.  Skin:    General: Skin is warm and dry.  Neurological:     General: No focal deficit present.     Mental Status: She is alert. Mental status is at baseline.  Psychiatric:        Mood and Affect: Mood normal.        Behavior: Behavior normal.     No results found for any visits on 01/09/22.      Assessment & Plan:   1. Acute midline thoracic back pain: I still think the main cause of her pain is acute muscle spasms.  Continue to use anti-inflammatories and muscle relaxers as needed.  Continue moist heat and gentle stretching and massage.  She was given a work note stating she cannot lift above 15 pounds for the next week to prevent reinjury  - DG Thoracic Spine 2 View; Future  2. Hematuria, unspecified type/Urinary frequency: UA in the office negative for acute infection but she did have microscopic hematuria.  Obtain KUB to rule  out kidney stones.  Urine also send for culture.  - Urine Culture - DG Abd 1 View; Future   Return if symptoms worsen or fail to improve.  Teodora Medici, DO

## 2022-01-10 LAB — URINE CULTURE
MICRO NUMBER:: 14212984
SPECIMEN QUALITY:: ADEQUATE

## 2022-01-16 ENCOUNTER — Encounter: Payer: Self-pay | Admitting: Internal Medicine

## 2022-01-16 ENCOUNTER — Other Ambulatory Visit: Payer: Self-pay | Admitting: Internal Medicine

## 2022-01-16 DIAGNOSIS — M546 Pain in thoracic spine: Secondary | ICD-10-CM

## 2022-01-16 MED ORDER — METHYLPREDNISOLONE 4 MG PO TBPK: ORAL_TABLET | ORAL | 0 refills | Status: DC

## 2022-01-19 ENCOUNTER — Emergency Department
Admission: EM | Admit: 2022-01-19 | Discharge: 2022-01-19 | Disposition: A | Discharge status: Home / Self Care | Payer: 59 | Attending: Emergency Medicine | Admitting: Emergency Medicine

## 2022-01-19 ENCOUNTER — Emergency Department: Payer: 59

## 2022-01-19 ENCOUNTER — Other Ambulatory Visit: Payer: Self-pay

## 2022-01-19 ENCOUNTER — Ambulatory Visit: Payer: Self-pay

## 2022-01-19 DIAGNOSIS — M5414 Radiculopathy, thoracic region: Secondary | ICD-10-CM

## 2022-01-19 DIAGNOSIS — M546 Pain in thoracic spine: Secondary | ICD-10-CM

## 2022-01-19 DIAGNOSIS — R1030 Lower abdominal pain, unspecified: Secondary | ICD-10-CM | POA: Diagnosis not present

## 2022-01-19 DIAGNOSIS — R0789 Other chest pain: Secondary | ICD-10-CM | POA: Diagnosis not present

## 2022-01-19 LAB — BASIC METABOLIC PANEL
Anion gap: 8 (ref 5–15)
BUN: 14 mg/dL (ref 6–20)
CO2: 28 mmol/L (ref 22–32)
Calcium: 9.7 mg/dL (ref 8.9–10.3)
Chloride: 108 mmol/L (ref 98–111)
Creatinine, Ser: 0.57 mg/dL (ref 0.44–1.00)
GFR, Estimated: >60 mL/min (ref >60)
Glucose, Bld: 90 mg/dL (ref 70–99)
Potassium: 3.8 mmol/L (ref 3.5–5.1)
Sodium: 144 mmol/L (ref 135–145)

## 2022-01-19 LAB — CBC
HCT: 39.5 % (ref 36.0–46.0)
Hemoglobin: 12.1 g/dL (ref 12.0–15.0)
MCH: 26.9 pg (ref 26.0–34.0)
MCHC: 30.6 g/dL (ref 30.0–36.0)
MCV: 88 fL (ref 80.0–100.0)
Platelets: 394 10*3/uL (ref 150–400)
RBC: 4.49 MIL/uL (ref 3.87–5.11)
RDW: 15.2 % (ref 11.5–15.5)
WBC: 17.7 10*3/uL — ABNORMAL HIGH (ref 4.0–10.5)
nRBC: 0 % (ref 0.0–0.2)

## 2022-01-19 LAB — POC URINE PREG, ED: Preg Test, Ur: NEGATIVE

## 2022-01-19 LAB — TROPONIN I (HIGH SENSITIVITY)
Troponin I (High Sensitivity): 8 ng/L (ref <18)
Troponin I (High Sensitivity): 6 ng/L (ref <18)

## 2022-01-19 MED ORDER — KETOROLAC TROMETHAMINE 30 MG/ML IJ SOLN
15.0000 mg | Freq: Once | INTRAMUSCULAR | Status: AC
  Administered 2022-01-19: 15 mg via INTRAVENOUS
  Filled 2022-01-19: qty 1

## 2022-01-19 MED ORDER — IOHEXOL 350 MG/ML SOLN
100.0000 mL | Freq: Once | INTRAVENOUS | Status: AC | PRN
  Administered 2022-01-19: 100 mL via INTRAVENOUS

## 2022-01-19 MED ORDER — NAPROXEN 375 MG PO TABS: 375.0000 mg | ORAL_TABLET | Freq: Two times a day (BID) | ORAL | 0 refills | Status: AC

## 2022-01-19 MED ORDER — ACETAMINOPHEN 500 MG PO TABS
1000.0000 mg | ORAL_TABLET | Freq: Once | ORAL | Status: AC
  Administered 2022-01-19: 1000 mg via ORAL
  Filled 2022-01-19: qty 2

## 2022-01-19 MED ORDER — OXYCODONE-ACETAMINOPHEN 5-325 MG PO TABS: 1.0000 | ORAL_TABLET | Freq: Four times a day (QID) | ORAL | 0 refills | Status: DC | PRN

## 2022-01-19 MED ORDER — ONDANSETRON HCL 4 MG/2ML IJ SOLN
4.0000 mg | Freq: Once | INTRAMUSCULAR | Status: AC
  Administered 2022-01-19: 4 mg via INTRAVENOUS
  Filled 2022-01-19: qty 2

## 2022-01-19 MED ORDER — MORPHINE SULFATE (PF) 4 MG/ML IV SOLN
6.0000 mg | Freq: Once | INTRAVENOUS | Status: AC
  Administered 2022-01-19: 6 mg via INTRAVENOUS
  Filled 2022-01-19: qty 2

## 2022-01-19 MED ORDER — MORPHINE SULFATE (PF) 4 MG/ML IV SOLN
4.0000 mg | Freq: Once | INTRAVENOUS | Status: AC
  Administered 2022-01-19: 4 mg via INTRAVENOUS
  Filled 2022-01-19: qty 1

## 2022-01-19 MED ORDER — OMEPRAZOLE MAGNESIUM 20 MG PO TBEC: 20.0000 mg | DELAYED_RELEASE_TABLET | Freq: Every day | ORAL | 0 refills | Status: DC

## 2022-01-19 NOTE — Telephone Encounter (Signed)
Called pt and Lvm to schedule the patient an appt

## 2022-01-19 NOTE — ED Notes (Signed)
RN made initial contact with pt, pt requesting to toilet prior to getting placed back on monitor, RN assessed pain level and requested urine sample. Pt given call bell.

## 2022-01-19 NOTE — Telephone Encounter (Signed)
Chief Complaint: SOB Symptoms: upper chest and back feels like something is squeezing her Frequency: this am  Pertinent Negatives: Patient denies dizziness, runny nose, cough, fever Disposition: [x] ED /[] Urgent Care (no appt availability in office) / [] Appointment(In office/virtual)/ []  [facility]/ [] Home Care/ [] Refused Recommended Disposition /[] [place]/ []  Follow-up with PCP Additional Notes: n/a Reason for Disposition  Difficulty breathing  Answer Assessment - Initial Assessment Questions 1. RESPIRATORY STATUS: "Describe your breathing?" (e.g., wheezing, shortness of breath, unable to speak, severe coughing)      SOB 2. ONSET: "When did this breathing problem begin?"      This am  3. PATTERN "Does the difficult breathing come and go, or has it been constant since it started?"      constant 4. SEVERITY: "How bad is your breathing?" (e.g., mild, moderate, severe)    - MILD: No SOB at rest, mild SOB with walking, speaks normally in sentences, can lie down, no retractions, pulse < 100.    - MODERATE: SOB at rest, SOB with minimal exertion and prefers to sit, cannot lie down flat, speaks in phrases, mild retractions, audible wheezing, pulse 100-120.    - SEVERE: Very SOB at rest, speaks in single words, struggling to breathe, sitting hunched forward, retractions, pulse > 120      mild 5. RECURRENT SYMPTOM: "Have you had difficulty breathing before?" If Yes, ask: "When was the last time?" and "What happened that time?"      N/a 6. CARDIAC HISTORY: "Do you have any history of heart disease?" (e.g., heart attack, angina, bypass surgery, angioplasty)      N/a 7. LUNG HISTORY: "Do you have any history of lung disease?"  (e.g., pulmonary embolus, asthma, emphysema)     N/a 8. CAUSE: "What do you think is causing the breathing problem?"      Worried heart issue  9. OTHER SYMPTOMS: "Do you have any other symptoms? (e.g., dizziness, runny nose, cough, chest pain,  fever)     Grabbing or squeezing pain across back  to under both breasts to shoulder blades both sides squeezing me- "feels like a Boa constrictor is squeezing me" 10. O2 SATURATION MONITOR:  "Do you use an oxygen saturation monitor (pulse oximeter) at home?" If Yes, ask: "What is your reading (oxygen level) today?" "What is your usual oxygen saturation reading?" (e.g., 95%)       N/a 11. PREGNANCY: "Is there any chance you are pregnant?" "When was your last menstrual period?"       N/a 12. TRAVEL: "Have you traveled out of the country in the last month?" (e.g., travel history, exposures)       N/a  Answer Assessment - Initial Assessment Questions 1. LOCATION: "Where does it hurt?"       Across back around the chest on both sides 2. RADIATION: "Does the pain go anywhere else?" (e.g., into neck, jaw, arms, back)     no 3. ONSET: "When did the chest pain begin?" (Minutes, hours or days)      This am  4. PATTERN: "Does the pain come and go, or has it been constant since it started?"  "Does it get worse with exertion?"      constant 5. DURATION: "How long does it last" (e.g., seconds, minutes, hours)     N/a 6. SEVERITY: "How bad is the pain?"  (e.g., Scale 1-10; mild, moderate, or severe)    - MILD (1-3): doesn't interfere with normal activities     -  MODERATE (4-7): interferes with normal activities or awakens from sleep    - SEVERE (8-10): excruciating pain, unable to do any normal activities       mild 7. CARDIAC RISK FACTORS: "Do you have any history of heart problems or risk factors for heart disease?" (e.g., angina, prior heart attack; diabetes, high blood pressure, high cholesterol, smoker, or strong family history of heart disease)     N/a 8. PULMONARY RISK FACTORS: "Do you have any history of lung disease?"  (e.g., blood clots in lung, asthma, emphysema, birth control pills)     N/a 9. CAUSE: "What do you think is causing the chest pain?"     Heart related 10. OTHER SYMPTOMS: "Do  you have any other symptoms?" (e.g., dizziness, nausea, vomiting, sweating, fever, difficulty breathing, cough)       Feels like something is squeezing her entire upper chest and back 11. PREGNANCY: "Is there any chance you are pregnant?" "When was your last menstrual period?"       N/a  Protocols used: Breathing Difficulty-A-AH, Chest Pain-A-AH

## 2022-01-19 NOTE — ED Triage Notes (Signed)
C/O wheezing and SOB with exertion.  States symptoms started this morning.   Patient is AAOx3.  Skin warm and dry. No SOB/ DOE noted.  Patient states she is currently being treated for muscle spasms to back and feels like a ' boa constrictor is squeezing her around lower ribs.

## 2022-01-19 NOTE — ED Provider Notes (Signed)
[facility] Provider Note    Event Date/Time   First MD Initiated Contact with Patient 01/19/22 [phone]     (approximate)   History   Chest Pain   HPI  Kathleen Conley is a 51 y.o. female here with chest and back pain.  The patient states that for the last 2 and half weeks, she has had intermittent aching, throbbing, severe, thoracic back pain.  This has been somewhat positional.  She has been treated with steroids for this with some mild improvement.  Today, she also began to develop fairly cute onset, severe, bandlike pain around her lower abdomen radiating around to her chest.  Denies any fevers.  No cough.  She felt like something was squeezing her.  Denies known history of coronary disease.  This is new from her back pain.  No trauma.  No other complaints.     Physical Exam   Triage Vital Signs: ED Triage Vitals  Enc Vitals Group     BP 01/19/22 0909 (!) 209/90     Pulse Rate 01/19/22 0909 72     Resp 01/19/22 0909 18     Temp 01/19/22 0909 (!) 97.5 F (36.4 C)     Temp Source 01/19/22 0909 Oral     SpO2 01/19/22 0909 96 %     Weight 01/19/22 0900 230 lb 6.1 oz (104.5 kg)     Height 01/19/22 0900 5\' 2"  (1.575 m)     Head Circumference --      Peak Flow --      Pain Score --      Pain Loc --      Pain Edu? --      Excl. in GC? --     Most recent vital signs: Vitals:   01/19/22 1245 01/19/22 1301  BP:    Pulse:    Resp: 14   Temp:  98.4 F (36.9 C)  SpO2:       General: Awake, no distress.  CV:  Good peripheral perfusion.  Regular rate and rhythm.  Radial pulses 2+ and symmetric. Resp:  Normal effort.  Lungs clear. Abd:  No distention.  No tenderness. Other:  Trace bilateral lower extremity edema.   ED Results / Procedures / Treatments   Labs (all labs ordered are listed, but only abnormal results are displayed) Labs Reviewed  CBC - Abnormal; Notable for the following components:      Result Value   WBC 17.7 (*)    All  other components within normal limits  BASIC METABOLIC PANEL  POC URINE PREG, ED  TROPONIN I (HIGH SENSITIVITY)  TROPONIN I (HIGH SENSITIVITY)     EKG Normal sinus rhythm, trickle rate 76.  PR 164, QRS 86, QTc 445.  No acute ST elevations or depressions.  No acute events of acute ischemia or infarct.   RADIOLOGY Chest x-ray left apical density concerning for nodule CT angio abdomen/pelvis:No acute aneurysm or dissection, no PE, no acute abnormality CT T Spine:degen changes without acute issue   I also independently reviewed and agree with radiologist interpretations.   PROCEDURES:  Critical Care performed: No  .1-3 Lead EKG Interpretation  Performed by: 01/21/22, MD Authorized by: Shaune Pollack, MD     Interpretation: normal     ECG rate:  60-80   ECG rate assessment: normal     Rhythm: sinus rhythm     Ectopy: none     Conduction: normal   Comments:  Indication: chest pain     MEDICATIONS ORDERED IN ED: Medications  morphine (PF) 4 MG/ML injection 6 mg (6 mg Intravenous Given 01/19/22 1100)  ondansetron (ZOFRAN) injection 4 mg (4 mg Intravenous Given 01/19/22 1100)  acetaminophen (TYLENOL) tablet 1,000 mg (1,000 mg Oral Given 01/19/22 1100)  iohexol (OMNIPAQUE) 350 MG/ML injection 100 mL (100 mLs Intravenous Contrast Given 01/19/22 1112)  ketorolac (TORADOL) 30 MG/ML injection 15 mg (15 mg Intravenous Given 01/19/22 1256)  morphine (PF) 4 MG/ML injection 4 mg (4 mg Intravenous Given 01/19/22 1256)     IMPRESSION / MDM / ASSESSMENT AND PLAN / ED COURSE  I reviewed the triage vital signs and the nursing notes.                              Differential diagnosis includes, but is not limited to, thoracic radiculopathy, thoracic osteo/ddd, ACS, PTX, PNA, dissection, PE, GERD/gastritis.  Patient's presentation is most consistent with acute presentation with potential threat to life or bodily function.   The patient is on the cardiac monitor to  evaluate for evidence of arrhythmia and/or significant heart rate changes.  51 year old female with history of obesity, hypertension, here with thoracic back pain and chest pain.  Initial differential as above.  Suspect thoracic radiculopathy with possible degenerative disease.  CT of the T-spine does show significant degenerative disease in this area.  CT angio dissection obtained given the degree of her pain and hypertension and fortunately shows no evidence of dissection or other acute abnormality.  Her troponin is negative with normal EKG and I do not suspect ACS.  She has no evidence of PE.  Otherwise, lab work is very reassuring.  She has a nonspecific leukocytosis.  I suspect this is from her Medrol Dosepak which she just started 2 to 3 days ago.Marland Kitchen  BMP is unremarkable.  Will treat symptomatically, add NSAIDs as she has no contraindications to this, advised PPI while she is on this plus steroids, and give brief course of analgesia.  Discussed that she may ultimately need PT referral and further imaging as an outpatient.  Return precautions given.   FINAL CLINICAL IMPRESSION(S) / ED DIAGNOSES   Final diagnoses:  Thoracic radiculopathy  Acute midline thoracic back pain     Rx / DC Orders   ED Discharge Orders          Ordered    oxyCODONE-acetaminophen (PERCOCET) 5-325 MG tablet  Every 6 hours PRN        01/19/22 1255    naproxen (NAPROSYN) 375 MG tablet  2 times daily with meals        01/19/22 1255    omeprazole (PRILOSEC OTC) 20 MG tablet  Daily        01/19/22 1255             Note:  This document was prepared using Dragon voice recognition software and may include unintentional dictation errors.   Shaune Pollack, MD 01/19/22 1310

## 2022-01-19 NOTE — ED Notes (Signed)
Dr Isaacs at bedside 

## 2022-01-19 NOTE — ED Notes (Signed)
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  Pt states she is currently on a prednisone taper and muscle relaxer for back spasms. Pt denies fevers or chills. Pt in NAD, pt speaking in full sentences with no issues at this time.Pt denies any HX of underlying lung issues but does endorse HX of hypertension.   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 Pt is A&Ox4. Respirations even and unlabored at this time.   Family at bedside.

## 2022-01-19 NOTE — Discharge Instructions (Signed)
Take the Naproxen and antacid as prescribed  Continue the steroid pack  Take the OXYCODONE for severe pain  Follow-up with Dr. Carlynn Purl if symptoms do not improve. You may benefit from an MRI and/or referral to a spine specialist. The specialist on call today is provided above.

## 2022-02-05 ENCOUNTER — Other Ambulatory Visit: Payer: Self-pay | Admitting: Family Medicine

## 2022-02-05 DIAGNOSIS — F325 Major depressive disorder, single episode, in full remission: Secondary | ICD-10-CM

## 2022-02-14 ENCOUNTER — Telehealth: Payer: Self-pay | Admitting: Neurosurgery

## 2022-02-14 NOTE — Telephone Encounter (Signed)
-----   Message from Rockey Situ sent at 02/14/2022  8:15 AM EST ----- Regarding: [facility] ER f/u Contact: [phone] Patient was seen in the ER on 01/19/2022 for thoracic back pain. Per Dr.Yarbrough patient can follow-up with her PCP or one of the office Pas. The office has reached out to the patient 3 times and left 3 message. The office will wait for the patient to return the call.

## 2022-03-10 NOTE — Progress Notes (Signed)
Name: Kathleen Conley   MRN: [id number]    DOB: 04/22/1970   Date:03/13/2022       Progress Note  Subjective  Chief Complaint  Follow Up  HPI  Major depression: she is currently on Wellbutrin XL 150 mg in am and Duloxetine and usually enough during the Summer months, bu she also seasonal affective disorder and phq9 is higher today, she is wiling to go up on Wellbutrin dose from 150 to 300 mg until next visit this Spring. She denies suicidal thoughts or ideation. I also advised her to take modafinil this time of the year before work    Metabolic Syndrome and Obesity/fatty liver : we tried giving her Trulicity but needs step therapy, she did not like  Metformin - caused diarrhea . She denies polyphagia, polyuria or polydipsia. She is only on life style modification and last A1C was 5.9 %. Currently on diet only, discussed some home exercises    Obesity: she has a long history of obesity, started after her divorce when she was in her 80's. She tried weight watchers but was unsuccessful, we discussed intermittent fasting again. She has changed her diet , eating healthier breakfast, avoiding fast food, walking more at work and weight is stable. We will try sending rx for [facility] , we also discussed intermittent fasting again    FMS: she takes Duloxetine , could not tolerate Lyrica ( caused nightmares and vivid dreams) also stopped Gabapentin ( she was grinding her teeth), taking skelaxin three times a day and is stable   Back pain : usually has low back pain, last Fall had thoracic pain, she is back to baseline pain, taking tylenol prn, skelaxin, no radiculitis   OSA: she has mild symptoms, did not qualify for CPAP , avoiding sleeping on her back,  trying to sleep  on her side. She also has shift work sleep disorder and states nuvigil helps her stay awake, taking it prn only   HTN: , she is currently on Bystolic and Benicar and is doing well now, bp is at goal. She denies chest pain,  palpitation  or dizziness. Continue current regiment   Leucocytosis and anemia: she has seen hematologist in the past and does not want to go back at this time. She has hidradenitis suppurative , last WBC was very high again but she was having a flare of hydradenitis   Dyslipidemia: last LDL was 121 , HDL 45 discussed ways to improved HDL again with patient   The 10-year ASCVD risk score (Arnett DK, et al., 2019) is: 7%   Values used to calculate the score:     Age: 31 years     Sex: Female     Is Non-Hispanic African American: Yes     Diabetic: No     Tobacco smoker: No     Systolic Blood Pressure: 138 mmHg     Is BP treated: Yes     HDL Cholesterol: 45 mg/dL     Total Cholesterol: 197 mg/dL   AR: she states zyrtec works if she takes it daily, takes flonase prn  Stable   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   Flat foot 01/28/2016  Aortic sclerosis 10/14/2015   Midline low back pain without sciatica 09/20/2015   Leukocytosis 11/29/2014   Osteoarthritis of left knee 11/20/2014   History of ventral hernia repair 10/27/2014   Allergic rhinitis 08/17/2014   Axillary hidradenitis suppurativa 08/17/2014   Baker cyst 08/17/2014   Chronic constipation 08/17/2014   Major depression in remission ([place]) 08/17/2014   Engages in travel abroad 08/17/2014   Fibromyalgia 08/17/2014   Cardiac murmur 0000000   Dysmetabolic syndrome 0000000   Plantar fasciitis 08/17/2014   Beat, premature ventricular 08/17/2014   Abnormal neurological finding suggestive of lumbar-level spinal disorder 08/17/2014   Obstructive apnea 12/02/2013   Essential (primary) hypertension 12/09/2009   Hypertrichosis 12/09/2009     Past Surgical History:  Procedure Laterality Date   ACHILLES TENDON SURGERY Left 08/15/2019   Procedure: TRIPLE ARTHRODESIS AND ACHILLES TENDON LENGTHENING LEFT FOOT;  Surgeon: Samara Deist, DPM;  Location: [facility] ORS;  Service: Podiatry;  Laterality: Left;   COLONOSCOPY WITH PROPOFOL N/A 07/23/2020   Procedure: COLONOSCOPY WITH PROPOFOL;  Surgeon: Virgel Manifold, MD;  Location: [facility] ENDOSCOPY;  Service: Endoscopy;  Laterality: N/A;   FOOT ARTHRODESIS Right 12/01/2016   Procedure: ARTHRODESIS FOOT; TRIPLE;  Surgeon: Samara Deist, DPM;  Location: [facility] ORS;  Service: Podiatry;  Laterality: Right;   HERNIA REPAIR  10/09/11   [facility] Incarcerated- Dr. Pat Patrick   OOPHORECTOMY Left 12/09/2009   TUBAL LIGATION  1993   VENTRAL HERNIA REPAIR  10/09/2011    Family History  Problem Relation Age of Onset   Hypertension Mother    CVA Mother    Kidney disease Mother    Congenital heart disease Mother    Heart disease Father    Hypertension Father    Diabetes Father    Breast cancer Paternal Grandmother        Bilateral   Colon cancer Maternal Grandfather    Colon cancer Maternal Uncle     Social History   Tobacco Use   Smoking status: Never   Smokeless tobacco: Never  Substance Use Topics   Alcohol use: No    Alcohol/week: 0.0 standard drinks of alcohol     Current Outpatient Medications:    acetaminophen (TYLENOL) 500 MG tablet, Take 1 tablet (500 mg total) by mouth every 6 (six) hours as needed. Max of 3 grams daily or 6 pills dialy (Patient taking differently: Take 1,000 mg by mouth every 6 (six) hours as needed for moderate pain or headache.), Disp: 30 tablet, Rfl: 0   aspirin EC 81 MG tablet, Take 1 tablet (81 mg total) by mouth daily., Disp: 30 tablet, Rfl: 0   cetirizine (ZYRTEC) 10 MG tablet, Take 10 mg by mouth every evening., Disp: , Rfl:    Cholecalciferol (VITAMIN D) 2000 UNITS tablet, Take 2,000 Units by mouth every evening. , Disp: , Rfl:    docusate sodium (COLACE)  100 MG capsule, Take 100 mg by mouth daily as needed for moderate constipation. , Disp: , Rfl:    ferrous sulfate 325 (65 FE) MG tablet, Take 325 mg by mouth every evening. , Disp: , Rfl:    fluticasone (FLONASE) 50 MCG/ACT nasal spray, Place 2 sprays into both nostrils daily as needed for allergies., Disp: 16 g, Rfl: 1   Multiple Vitamin (MULTIVITAMIN WITH MINERALS) TABS tablet, Take 1 tablet by mouth every evening., Disp: , Rfl:    Semaglutide-Weight Management 0.25 MG/0.5ML SOAJ, Inject 0.25 mg into the skin once a week for 28 days., Disp: 2 mL, Rfl: 0   [  START ON 04/11/2022] Semaglutide-Weight Management 0.5 MG/0.5ML SOAJ, Inject 0.5 mg into the skin once a week for 28 days., Disp: 2 mL, Rfl: 0   [START ON 05/10/2022] Semaglutide-Weight Management 1 MG/0.5ML SOAJ, Inject 1 mg into the skin once a week for 28 days. After the 0.5 dose, Disp: 2 mL, Rfl: 0   Armodafinil (NUVIGIL) 150 MG tablet, Take 1 tablet (150 mg total) by mouth daily as needed (shift work--sleepiness)., Disp: 30 tablet, Rfl: 2   buPROPion (WELLBUTRIN XL) 300 MG 24 hr tablet, Take 1 tablet (300 mg total) by mouth daily., Disp: 90 tablet, Rfl: 0   DULoxetine (CYMBALTA) 60 MG capsule, Take 1 capsule (60 mg total) by mouth every evening., Disp: 90 capsule, Rfl: 0   metaxalone (SKELAXIN) 800 MG tablet, Take 1 tablet (800 mg total) by mouth 3 (three) times daily as needed for muscle spasms., Disp: 270 tablet, Rfl: 0   nebivolol (BYSTOLIC) 5 MG tablet, Take 1 tablet (5 mg total) by mouth daily., Disp: 90 tablet, Rfl: 1   olmesartan (BENICAR) 40 MG tablet, Take 1 tablet (40 mg total) by mouth daily., Disp: 90 tablet, Rfl: 1   TRAVATAN Z 0.004 % SOLN ophthalmic solution, Place 1 drop into both eyes at bedtime. (Patient not taking: Reported on 03/13/2022), Disp: , Rfl: 3  Allergies  Allergen Reactions   Lisinopril Cough        Norvasc [Amlodipine Besylate]    Hctz [Hydrochlorothiazide] Other (See Comments)    Burning sensation on her  feet     I personally reviewed active problem list, medication list, allergies, family history, social history, health maintenance with the patient/caregiver today.   ROS  Constitutional: Negative for fever or weight change.  Respiratory: Negative for cough and shortness of breath.   Cardiovascular: Negative for chest pain or palpitations.  Gastrointestinal: Negative for abdominal pain, no bowel changes.  Musculoskeletal: Negative for gait problem or joint swelling.  Skin: Negative for rash.  Neurological: Negative for dizziness or headache.  No other specific complaints in a complete review of systems (except as listed in HPI above).   Objective  Vitals:   03/13/22 1011  BP: 138/84  Pulse: 96  Resp: 18  Temp: 98.1 F (36.7 C)  TempSrc: Oral  SpO2: 100%  Weight: 228 lb 14.4 oz (103.8 kg)  Height: 5\' 2"  (1.575 m)    Body mass index is 41.87 kg/m.  Physical Exam  Constitutional: Patient appears well-developed and well-nourished. Obese  No distress.  HEENT: head atraumatic, normocephalic, pupils equal and reactive to light, neck supple Cardiovascular: Normal rate, regular rhythm and normal heart sounds.  No murmur heard. No BLE edema. Pulmonary/Chest: Effort normal and breath sounds normal. No respiratory distress. Abdominal: Soft.  There is no tenderness. Muscular skeletal: trigger point positive  Psychiatric: Patient has a normal mood and affect. behavior is normal. Judgment and thought content normal.   Recent Results (from the past 2160 hour(s))  POCT Urinalysis Dipstick     Status: None   Collection Time: 01/09/22  1:30 PM  Result Value Ref Range   Color, UA yellow    Clarity, UA clear    Glucose, UA Negative Negative   Bilirubin, UA neg    Ketones, UA neg    Spec Grav, UA 1.010 1.010 - 1.025   Blood, UA pos    pH, UA 6.5 5.0 - 8.0   Protein, UA Negative Negative   Urobilinogen, UA 0.2 0.2 or 1.0 E.U./dL   Nitrite, UA neg  Leukocytes, UA Negative  Negative   Appearance clear    Odor normal   Urine Culture     Status: None   Collection Time: 01/09/22  1:53 PM   Specimen: Urine  Result Value Ref Range   MICRO NUMBER: [id number]    SPECIMEN QUALITY: Adequate    Sample Source URINE    STATUS: FINAL    Result:      Mixed genital flora isolated. These superficial bacteria are not indicative of a urinary tract infection. No further organism identification is warranted on this specimen. If clinically indicated, recollect clean-catch, mid-stream urine and transfer  immediately to Urine Culture Transport Tube.   Basic metabolic panel     Status: None   Collection Time: 01/19/22  9:02 AM  Result Value Ref Range   Sodium 144 135 - 145 mmol/L   Potassium 3.8 3.5 - 5.1 mmol/L   Chloride 108 98 - 111 mmol/L   CO2 28 22 - 32 mmol/L   Glucose, Bld 90 70 - 99 mg/dL    Comment: Glucose reference range applies only to samples taken after fasting for at least 8 hours.   BUN 14 6 - 20 mg/dL   Creatinine, Ser 7.26 0.44 - 1.00 mg/dL   Calcium 9.7 8.9 - 20.3 mg/dL   GFR, Estimated >55 >97 mL/min    Comment: (NOTE) Calculated using the CKD-EPI Creatinine Equation (2021)    Anion gap 8 5 - 15    Comment: Performed at [facility], [address] [place], [place] [address]  CBC     Status: Abnormal   Collection Time: 01/19/22  9:02 AM  Result Value Ref Range   WBC 17.7 (H) 4.0 - 10.5 K/uL   RBC 4.49 3.87 - 5.11 MIL/uL   Hemoglobin 12.1 12.0 - 15.0 g/dL   HCT 45.3 64.6 - 80.3 %   MCV 88.0 80.0 - 100.0 fL   MCH 26.9 26.0 - 34.0 pg   MCHC 30.6 30.0 - 36.0 g/dL   RDW 21.2 24.8 - 25.0 %   Platelets 394 150 - 400 K/uL   nRBC 0.0 0.0 - 0.2 %    Comment: Performed at [facility], [address] [place], [place] [address]  Troponin I (High Sensitivity)     Status: None   Collection Time: 01/19/22  9:02 AM  Result Value Ref Range   Troponin I (High Sensitivity) 8 <18 ng/L    Comment: (NOTE) Elevated high sensitivity troponin  I (hsTnI) values and significant  changes across serial measurements may suggest ACS but many other  chronic and acute conditions are known to elevate hsTnI results.  Refer to the "Links" section for chest pain algorithms and additional  guidance. Performed at [facility], [address] [place], [place] [address]   Troponin I (High Sensitivity)     Status: None   Collection Time: 01/19/22 10:55 AM  Result Value Ref Range   Troponin I (High Sensitivity) 6 <18 ng/L    Comment: (NOTE) Elevated high sensitivity troponin I (hsTnI) values and significant  changes across serial measurements may suggest ACS but many other  chronic and acute conditions are known to elevate hsTnI results.  Refer to the "Links" section for chest pain algorithms and additional  guidance. Performed at [facility], [address] [place], [place] [address]   POC urine preg, ED     Status: None   Collection Time: 01/19/22 12:00 PM  Result Value Ref Range  Preg Test, Ur Negative Negative    PHQ2/9:    03/13/2022   10:14 AM 01/09/2022    1:20 PM 01/03/2022    1:32 PM 11/09/2021    9:44 AM 10/10/2021   10:46 AM  Depression screen PHQ 2/9  Decreased Interest 2 0 0 0 0  Down, Depressed, Hopeless 2 0 0 0 0  PHQ - 2 Score 4 0 0 0 0  Altered sleeping 2 0 0  0  Tired, decreased energy 2 0 0  0  Change in appetite 0 0 0  0  Feeling bad or failure about yourself  2 0 0  0  Trouble concentrating 2 0 0  0  Moving slowly or fidgety/restless 0 0 0  0  Suicidal thoughts 0 0 0  0  PHQ-9 Score 12 0 0  0  Difficult doing work/chores Somewhat difficult Not difficult at all Not difficult at all      phq 9 is positive   Fall Risk:    03/13/2022   10:13 AM 01/09/2022    1:20 PM 01/03/2022    1:32 PM 11/09/2021    9:44 AM 10/10/2021   10:46 AM  Fall Risk   Falls in the past year? 0 0 0 0 0  Number falls in past yr:  0 0 0 0  Injury with Fall?  0 0 0 0  Risk for fall due to : No Fall Risks     No Fall Risks  Follow up Falls evaluation completed;Education provided;Falls prevention discussed   Falls evaluation completed Falls prevention discussed      Functional Status Survey: Is the patient deaf or have difficulty hearing?: No Does the patient have difficulty seeing, even when wearing glasses/contacts?: No Does the patient have difficulty concentrating, remembering, or making decisions?: No Does the patient have difficulty walking or climbing stairs?: Yes Does the patient have difficulty dressing or bathing?: No Does the patient have difficulty doing errands alone such as visiting a doctor's office or shopping?: No    Assessment & Plan  1. Morbid obesity ([place])  Discussed physical activity also  - Semaglutide-Weight Management 0.25 MG/0.5ML SOAJ; Inject 0.25 mg into the skin once a week for 28 days.  Dispense: 2 mL; Refill: 0 - Semaglutide-Weight Management 0.5 MG/0.5ML SOAJ; Inject 0.5 mg into the skin once a week for 28 days.  Dispense: 2 mL; Refill: 0 - Semaglutide-Weight Management 1 MG/0.5ML SOAJ; Inject 1 mg into the skin once a week for 28 days. After the 0.5 dose  Dispense: 2 mL; Refill: 0  2. Seasonal affective disorder ([place])  Resume higher dose wellbutrin   3. Major depression, recurrent, chronic (HCC)  - buPROPion (WELLBUTRIN XL) 300 MG 24 hr tablet; Take 1 tablet (300 mg total) by mouth daily.  Dispense: 90 tablet; Refill: 0  4. Shift work sleep disorder  - Armodafinil (NUVIGIL) 150 MG tablet; Take 1 tablet (150 mg total) by mouth daily as needed (shift work--sleepiness).  Dispense: 30 tablet; Refill: 2  5. Obstructive apnea  - Armodafinil (NUVIGIL) 150 MG tablet; Take 1 tablet (150 mg total) by mouth daily as needed (shift work--sleepiness).  Dispense: 30 tablet; Refill: 2  6. Essential (primary) hypertension  - nebivolol (BYSTOLIC) 5 MG tablet; Take 1 tablet (5 mg total) by mouth daily.  Dispense: 90 tablet; Refill: 1 - olmesartan (BENICAR) 40 MG  tablet; Take 1 tablet (40 mg total) by mouth daily.  Dispense: 90 tablet; Refill: 1  7. Dysmetabolic syndrome  We will try Wegovy , cannot tolerate metformin   8. Dyslipidemia  The 10-year ASCVD risk score (Arnett DK, et al., 2019) is: 7%   Values used to calculate the score:     Age: 38 years     Sex: Female     Is Non-Hispanic African American: Yes     Diabetic: No     Tobacco smoker: No     Systolic Blood Pressure: 025 mmHg     Is BP treated: Yes     HDL Cholesterol: 45 mg/dL     Total Cholesterol: 197 mg/dL   9. History of iron deficiency   10. Fibromyalgia  - DULoxetine (CYMBALTA) 60 MG capsule; Take 1 capsule (60 mg total) by mouth every evening.  Dispense: 90 capsule; Refill: 0 - metaxalone (SKELAXIN) 800 MG tablet; Take 1 tablet (800 mg total) by mouth 3 (three) times daily as needed for muscle spasms.  Dispense: 270 tablet; Refill: 0 - Armodafinil (NUVIGIL) 150 MG tablet; Take 1 tablet (150 mg total) by mouth daily as needed (shift work--sleepiness).  Dispense: 30 tablet; Refill: 2

## 2022-03-13 ENCOUNTER — Ambulatory Visit: Payer: 59 | Admitting: Family Medicine

## 2022-03-13 ENCOUNTER — Encounter: Payer: Self-pay | Admitting: Family Medicine

## 2022-03-13 DIAGNOSIS — Z8639 Personal history of other endocrine, nutritional and metabolic disease: Secondary | ICD-10-CM

## 2022-03-13 DIAGNOSIS — G4726 Circadian rhythm sleep disorder, shift work type: Secondary | ICD-10-CM

## 2022-03-13 DIAGNOSIS — M797 Fibromyalgia: Secondary | ICD-10-CM

## 2022-03-13 DIAGNOSIS — F339 Major depressive disorder, recurrent, unspecified: Secondary | ICD-10-CM | POA: Diagnosis not present

## 2022-03-13 DIAGNOSIS — I10 Essential (primary) hypertension: Secondary | ICD-10-CM

## 2022-03-13 DIAGNOSIS — E785 Hyperlipidemia, unspecified: Secondary | ICD-10-CM

## 2022-03-13 DIAGNOSIS — F338 Other recurrent depressive disorders: Secondary | ICD-10-CM | POA: Diagnosis not present

## 2022-03-13 DIAGNOSIS — E8881 Metabolic syndrome: Secondary | ICD-10-CM

## 2022-03-13 DIAGNOSIS — G4733 Obstructive sleep apnea (adult) (pediatric): Secondary | ICD-10-CM

## 2022-03-13 MED ORDER — NEBIVOLOL HCL 5 MG PO TABS: 5.0000 mg | ORAL_TABLET | Freq: Every day | ORAL | 1 refills | Status: DC

## 2022-03-13 MED ORDER — SEMAGLUTIDE-WEIGHT MANAGEMENT 0.25 MG/0.5ML ~~LOC~~ SOAJ: 0.2500 mg | SUBCUTANEOUS | 0 refills | Status: AC

## 2022-03-13 MED ORDER — OLMESARTAN MEDOXOMIL 40 MG PO TABS: 40.0000 mg | ORAL_TABLET | Freq: Every day | ORAL | 1 refills | Status: DC

## 2022-03-13 MED ORDER — SEMAGLUTIDE-WEIGHT MANAGEMENT 1 MG/0.5ML ~~LOC~~ SOAJ: 1.0000 mg | SUBCUTANEOUS | 0 refills | Status: AC

## 2022-03-13 MED ORDER — SEMAGLUTIDE-WEIGHT MANAGEMENT 0.5 MG/0.5ML ~~LOC~~ SOAJ: 0.5000 mg | SUBCUTANEOUS | 0 refills | Status: AC

## 2022-03-13 MED ORDER — BUPROPION HCL ER (XL) 300 MG PO TB24: 300.0000 mg | ORAL_TABLET | Freq: Every day | ORAL | 0 refills | Status: DC

## 2022-03-13 MED ORDER — METAXALONE 800 MG PO TABS: 800.0000 mg | ORAL_TABLET | Freq: Three times a day (TID) | ORAL | 0 refills | Status: DC | PRN

## 2022-03-13 MED ORDER — DULOXETINE HCL 60 MG PO CPEP: 60.0000 mg | ORAL_CAPSULE | Freq: Every evening | ORAL | 0 refills | Status: DC

## 2022-03-13 MED ORDER — ARMODAFINIL 150 MG PO TABS: 150.0000 mg | ORAL_TABLET | Freq: Every day | ORAL | 2 refills | Status: DC | PRN

## 2022-03-13 NOTE — Patient Instructions (Signed)
Weight loss medications ( wegovy or Zepbound) , ask insurance if they cover it

## 2022-03-21 ENCOUNTER — Other Ambulatory Visit: Payer: Self-pay | Admitting: Family Medicine

## 2022-03-21 DIAGNOSIS — F325 Major depressive disorder, single episode, in full remission: Secondary | ICD-10-CM

## 2022-03-31 ENCOUNTER — Encounter: Payer: Self-pay | Admitting: Family Medicine

## 2022-03-31 ENCOUNTER — Telehealth: Payer: Self-pay | Admitting: Family Medicine

## 2022-03-31 NOTE — Telephone Encounter (Signed)
Patient called in stating they took an at home COVID test that was positive. Patient wants to know if she can be prescribed the Anti-viral medication. Please follow up with patient [phone].

## 2022-04-03 NOTE — Telephone Encounter (Signed)
Lvm for pt to call and schedule an appt  

## 2022-06-09 NOTE — Progress Notes (Unsigned)
Name: Kathleen Conley   MRN: [id number]    DOB: Jul 31, 1970   Date:06/12/2022       Progress Note  Subjective  Chief Complaint  Follow Up  HPI  Major depression: when she came in last visit in January phq 9 was high again, we kept her dose of duloxetine at 60 mg and adjusted dose of wellbutrin to 300 mg daily, she states she also changed jobs. 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 Phq 9 today is zero. She is in remission now   Metabolic Syndrome and Obesity/fatty liver : we tried giving her Trulicity but needs step therapy, she did not like  Metformin - caused diarrhea . She denies polyphagia, polyuria or polydipsia. last A1C was 6 % and we will recheck next visit    Obesity: she has a long history of obesity, started after her divorce when she was in her 30's. She tried weight watchers but was unsuccessful, weight is stable.    FMS: she takes Duloxetine , could not tolerate Lyrica ( caused nightmares and vivid dreams) also stopped Gabapentin ( she was grinding her teeth), taking skelaxin three times a day and symptoms are stable  Back pain : usually has low back pain, last Fall had thoracic pain, she is back to baseline pain, taking tylenol prn, skelaxin, no radiculitis lately   OSA: she has mild symptoms, did not qualify for CPAP , avoiding sleeping on her back,  trying to sleep  on her side. She also has shift work sleep disorder and states nuvigil helps her stay awake, she needs a refill today   HTN: , she is currently on Bystolic and Benicar and is doing well now, bp is at goal. She denies chest pain,  palpitation or dizziness. BP is at goal  Leucocytosis and anemia: she has seen hematologist in the past and does not want to go back at this time. She has hidradenitis suppurative , last WBC was very high again but she was having a flare of hydradenitis , we will recheck labs during her  CPE this Summer   Dyslipidemia: last LDL was 121 , HDL 45   The 10-year ASCVD risk score (Arnett DK, et al., 2019) is: 5.3%   Values used to calculate the score:     Age: 52 years     Sex: Female     Is Non-Hispanic African American: Yes     Diabetic: No     Tobacco smoker: No     Systolic Blood Pressure: 128 mmHg     Is BP treated: Yes     HDL Cholesterol: 45 mg/dL     Total Cholesterol: 197 mg/dL   AR: she states zyrtec works if she takes it daily, takes flonase prn    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   Spasm of thoracic back muscle 12/13/2020   Morbid obesity 04/17/2018   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   Flat foot 01/28/2016   Aortic sclerosis 10/14/2015   Midline low back pain without sciatica 09/20/2015   Leukocytosis 11/29/2014   Osteoarthritis of left knee 11/20/2014   History of ventral hernia repair 10/27/2014   Allergic rhinitis 08/17/2014  Axillary hidradenitis suppurativa 08/17/2014   Baker cyst 08/17/2014   Chronic constipation 08/17/2014   Major depression in remission 08/17/2014   Engages in travel abroad 08/17/2014   Fibromyalgia 08/17/2014   Cardiac murmur 08/17/2014   Dysmetabolic syndrome 08/17/2014   Plantar fasciitis 08/17/2014   Beat, premature ventricular 08/17/2014   Abnormal neurological finding suggestive of lumbar-level spinal disorder 08/17/2014   Obstructive apnea 12/02/2013   Essential (primary) hypertension 12/09/2009   Hypertrichosis 12/09/2009    Past Surgical History:  Procedure Laterality Date   ACHILLES TENDON SURGERY Left 08/15/2019   Procedure: TRIPLE ARTHRODESIS AND ACHILLES TENDON LENGTHENING LEFT FOOT;  Surgeon: [name], DPM;  Location: [facility];  Service: Podiatry;  Laterality: Left;   COLONOSCOPY WITH PROPOFOL N/A 07/23/2020    Procedure: COLONOSCOPY WITH PROPOFOL;  Surgeon: Pasty Spillers, MD;  Location: [facility];  Service: Endoscopy;  Laterality: N/A;   FOOT ARTHRODESIS Right 12/01/2016   Procedure: ARTHRODESIS FOOT; TRIPLE;  Surgeon: [name], DPM;  Location: [facility];  Service: Podiatry;  Laterality: Right;   HERNIA REPAIR  10/09/11   Ventral Incarcerated- Dr. [name]   OOPHORECTOMY Left 12/09/2009   TUBAL LIGATION  1993   VENTRAL HERNIA REPAIR  10/09/2011    Family History  Problem Relation Age of Onset   Hypertension Mother    CVA Mother    Kidney disease Mother    Congenital heart disease Mother    Heart disease Father    Hypertension Father    Diabetes Father    Breast cancer Paternal Grandmother        Bilateral   Colon cancer Maternal Grandfather    Colon cancer Maternal Uncle     Social History   Tobacco Use   Smoking status: Never   Smokeless tobacco: Never  Substance Use Topics   Alcohol use: No    Alcohol/week: 0.0 standard drinks of alcohol     Current Outpatient Medications:    acetaminophen (TYLENOL) 500 MG tablet, Take 1 tablet (500 mg total) by mouth every 6 (six) hours as needed. Max of 3 grams daily or 6 pills dialy (Patient taking differently: Take 1,000 mg by mouth every 6 (six) hours as needed for moderate pain or headache.), Disp: 30 tablet, Rfl: 0   Armodafinil (NUVIGIL) 150 MG tablet, Take 1 tablet (150 mg total) by mouth daily as needed (shift work--sleepiness)., Disp: 30 tablet, Rfl: 2   aspirin EC 81 MG tablet, Take 1 tablet (81 mg total) by mouth daily., Disp: 30 tablet, Rfl: 0   buPROPion (WELLBUTRIN XL) 300 MG 24 hr tablet, Take 1 tablet (300 mg total) by mouth daily., Disp: 90 tablet, Rfl: 0   cetirizine (ZYRTEC) 10 MG tablet, Take 10 mg by mouth every evening., Disp: , Rfl:    Cholecalciferol (VITAMIN D) 2000 UNITS tablet, Take 2,000 Units by mouth every evening. , Disp: , Rfl:    docusate sodium (COLACE) 100 MG capsule, Take 100 mg by mouth daily as  needed for moderate constipation. , Disp: , Rfl:    DULoxetine (CYMBALTA) 60 MG capsule, Take 1 capsule (60 mg total) by mouth every evening., Disp: 90 capsule, Rfl: 0   ferrous sulfate 325 (65 FE) MG tablet, Take 325 mg by mouth every evening. , Disp: , Rfl:    fluticasone (FLONASE) 50 MCG/ACT nasal spray, Place 2 sprays into both nostrils daily as needed for allergies., Disp: 16 g, Rfl: 1   metaxalone (SKELAXIN) 800 MG tablet, Take 1 tablet (800 mg total) by  mouth 3 (three) times daily as needed for muscle spasms., Disp: 270 tablet, Rfl: 0   Multiple Vitamin (MULTIVITAMIN WITH MINERALS) TABS tablet, Take 1 tablet by mouth every evening., Disp: , Rfl:    nebivolol (BYSTOLIC) 5 MG tablet, Take 1 tablet (5 mg total) by mouth daily., Disp: 90 tablet, Rfl: 1   olmesartan (BENICAR) 40 MG tablet, Take 1 tablet (40 mg total) by mouth daily., Disp: 90 tablet, Rfl: 1   TRAVATAN Z 0.004 % SOLN ophthalmic solution, Place 1 drop into both eyes at bedtime., Disp: , Rfl: 3  Allergies  Allergen Reactions   Lisinopril Cough        Norvasc [Amlodipine Besylate]    Hctz [Hydrochlorothiazide] Other (See Comments)    Burning sensation on her feet     I personally reviewed active problem list, medication list, allergies, family history, social history, health maintenance with the patient/caregiver today.   ROS  Constitutional: Negative for fever or weight change.  Respiratory: Negative for cough and shortness of breath.   Cardiovascular: Negative for chest pain or palpitations.  Gastrointestinal: Negative for abdominal pain, no bowel changes.  Musculoskeletal: Negative for gait problem or joint swelling.  Skin: Negative for rash.  Neurological: Negative for dizziness or headache.  No other specific complaints in a complete review of systems (except as listed in HPI above).   Objective  Vitals:   06/12/22 1157  BP: 128/82  Pulse: 92  Resp: 18  Temp: 98.2 F (36.8 C)  TempSrc: Oral  SpO2: 97%   Weight: 230 lb 8 oz (104.6 kg)  Height:  (1.575 m)    Body mass index is 42.16 kg/m.  Physical Exam  Constitutional: Patient appears well-developed and well-nourished. Obese  No distress.  HEENT: head atraumatic, normocephalic, pupils equal and reactive to light, neck supple Cardiovascular: Normal rate, regular rhythm and normal heart sounds.  No murmur heard. No BLE edema. Pulmonary/Chest: Effort normal and breath sounds normal. No respiratory distress. Abdominal: Soft.  There is no tenderness. Psychiatric: Patient has a normal mood and affect. behavior is normal. Judgment and thought content normal.   PHQ2/9:    06/12/2022   11:58 AM 03/13/2022   10:14 AM 01/09/2022    1:20 PM 01/03/2022    1:32 PM 11/09/2021    9:44 AM  Depression screen PHQ 2/9  Decreased Interest 0 2 0 0 0  Down, Depressed, Hopeless 0 2 0 0 0  PHQ - 2 Score 0 4 0 0 0  Altered sleeping 0 2 0 0   Tired, decreased energy 0 2 0 0   Change in appetite 0 0 0 0   Feeling bad or failure about yourself  0 2 0 0   Trouble concentrating 0 2 0 0   Moving slowly or fidgety/restless 0 0 0 0   Suicidal thoughts 0 0 0 0   PHQ-9 Score 0 12 0 0   Difficult doing work/chores  Somewhat difficult Not difficult at all Not difficult at all     phq 9 is negative   Fall Risk:    06/12/2022   11:58 AM 03/13/2022   10:13 AM 01/09/2022    1:20 PM 01/03/2022    1:32 PM 11/09/2021    9:44 AM  Fall Risk   Falls in the past year? 1 0 0 0 0  Number falls in past yr: 0  0 0 0  Injury with Fall? 0  0 0 0  Risk for fall due to :  History of fall(s) No Fall Risks     Follow up Falls prevention discussed;Education provided;Falls evaluation completed Falls evaluation completed;Education provided;Falls prevention discussed   Falls evaluation completed      Functional Status Survey: Is the patient deaf or have difficulty hearing?: No Does the patient have difficulty seeing, even when wearing glasses/contacts?: Yes Does the  patient have difficulty concentrating, remembering, or making decisions?: No Does the patient have difficulty walking or climbing stairs?: Yes Does the patient have difficulty dressing or bathing?: No Does the patient have difficulty doing errands alone such as visiting a doctor's office or shopping?: No    Assessment & Plan   1. Fibromyalgia  - Armodafinil (NUVIGIL) 150 MG tablet; Take 1 tablet (150 mg total) by mouth daily as needed (shift work--sleepiness).  Dispense: 90 tablet; Refill: 0 - DULoxetine (CYMBALTA) 60 MG capsule; Take 1 capsule (60 mg total) by mouth every evening.  Dispense: 90 capsule; Refill: 0 - metaxalone (SKELAXIN) 800 MG tablet; Take 1 tablet (800 mg total) by mouth 3 (three) times daily as needed for muscle spasms.  Dispense: 270 tablet; Refill: 0  2. Shift work sleep disorder  - Armodafinil (NUVIGIL) 150 MG tablet; Take 1 tablet (150 mg total) by mouth daily as needed (shift work--sleepiness).  Dispense: 90 tablet; Refill: 0  3. Obstructive apnea  - Armodafinil (NUVIGIL) 150 MG tablet; Take 1 tablet (150 mg total) by mouth daily as needed (shift work--sleepiness).  Dispense: 90 tablet; Refill: 0  4. Seasonal allergic rhinitis, unspecified trigger  - fluticasone (FLONASE) 50 MCG/ACT nasal spray; Place 2 sprays into both nostrils daily as needed for allergies.  Dispense: 48 g; Refill: 0  5. Dysmetabolic syndrome  Recheck next visit   6. Dyslipidemia  Recheck next visit   7. Essential (primary) hypertension  Bp is at goal   8. Major depression in remission  - buPROPion (WELLBUTRIN XL) 300 MG 24 hr tablet; Take 1 tablet (300 mg total) by mouth daily.  Dispense: 90 tablet; Refill: 0 - DULoxetine (CYMBALTA) 60 MG capsule; Take 1 capsule (60 mg total) by mouth every evening.  Dispense: 90 capsule; Refill: 0

## 2022-06-10 ENCOUNTER — Other Ambulatory Visit: Payer: Self-pay | Admitting: Family Medicine

## 2022-06-10 DIAGNOSIS — F339 Major depressive disorder, recurrent, unspecified: Secondary | ICD-10-CM

## 2022-06-12 ENCOUNTER — Ambulatory Visit: Payer: 59 | Admitting: Family Medicine

## 2022-06-12 ENCOUNTER — Encounter: Payer: Self-pay | Admitting: Family Medicine

## 2022-06-12 VITALS — BP 128/82 | HR 92 | Temp 98.2°F | Resp 18 | Ht 62.0 in | Wt 230.5 lb

## 2022-06-12 DIAGNOSIS — G4733 Obstructive sleep apnea (adult) (pediatric): Secondary | ICD-10-CM

## 2022-06-12 DIAGNOSIS — J302 Other seasonal allergic rhinitis: Secondary | ICD-10-CM | POA: Diagnosis not present

## 2022-06-12 DIAGNOSIS — E8881 Metabolic syndrome: Secondary | ICD-10-CM

## 2022-06-12 DIAGNOSIS — M797 Fibromyalgia: Secondary | ICD-10-CM

## 2022-06-12 DIAGNOSIS — G4726 Circadian rhythm sleep disorder, shift work type: Secondary | ICD-10-CM

## 2022-06-12 DIAGNOSIS — I10 Essential (primary) hypertension: Secondary | ICD-10-CM

## 2022-06-12 DIAGNOSIS — F325 Major depressive disorder, single episode, in full remission: Secondary | ICD-10-CM

## 2022-06-12 DIAGNOSIS — E785 Hyperlipidemia, unspecified: Secondary | ICD-10-CM

## 2022-06-12 MED ORDER — BUPROPION HCL ER (XL) 300 MG PO TB24: 300.0000 mg | ORAL_TABLET | Freq: Every day | ORAL | 0 refills | Status: DC

## 2022-06-12 MED ORDER — FLUTICASONE PROPIONATE 50 MCG/ACT NA SUSP: 2.0000 | Freq: Every day | NASAL | 0 refills | Status: DC | PRN

## 2022-06-12 MED ORDER — DULOXETINE HCL 60 MG PO CPEP: 60.0000 mg | ORAL_CAPSULE | Freq: Every evening | ORAL | 0 refills | Status: DC

## 2022-06-12 MED ORDER — ARMODAFINIL 150 MG PO TABS: 150.0000 mg | ORAL_TABLET | Freq: Every day | ORAL | 0 refills | Status: DC | PRN

## 2022-06-12 MED ORDER — METAXALONE 800 MG PO TABS: 800.0000 mg | ORAL_TABLET | Freq: Three times a day (TID) | ORAL | 0 refills | Status: DC | PRN

## 2022-06-21 ENCOUNTER — Ambulatory Visit: Payer: Self-pay

## 2022-06-21 NOTE — Telephone Encounter (Signed)
  Chief Complaint: LUQ pain Symptoms: mild distension and tender to touch, constant pain Frequency: Tuesday am  Pertinent Negatives: Patient denies vomiting, diarrhea Disposition: [x] ED /[] Urgent Care (no appt availability in office) / [] Appointment(In office/virtual)/ []  [facility]/ [] Home Care/ [] Refused Recommended Disposition /[] [facility]/ []  Follow-up with PCP Additional Notes: pt going to [facility]. Routing note to PCP.   Reason for Disposition  [1] SEVERE pain (e.g., excruciating) AND [2] present > 1 hour  Answer Assessment - Initial Assessment Questions 1. LOCATION: "Where does it hurt?"      Left upper quadrant  to left reast 2. RADIATION: "Does the pain shoot anywhere else?" (e.g., chest, back)     Below abd  3. ONSET: "When did the pain begin?" (e.g., minutes, hours or days ago)      1 day  4. SUDDEN: "Gradual or sudden onset?"     suddenly 5. PATTERN "Does the pain come and go, or is it constant?"    - If it comes and goes: "How long does it last?" "Do you have pain now?"     (Note: Comes and goes means the pain is intermittent. It goes away completely between bouts.)    - If constant: "Is it getting better, staying the same, or getting worse?"      (Note: Constant means the pain never goes away completely; most serious pain is constant and gets worse.)      constant 6. SEVERITY: "How bad is the pain?"  (e.g., Scale 1-10; mild, moderate, or severe)    - MILD (1-3): Doesn't interfere with normal activities, abdomen soft and not tender to touch.     - MODERATE (4-7): Interferes with normal activities or awakens from sleep, abdomen tender to touch.     - SEVERE (8-10): Excruciating pain, doubled over, unable to do any normal activities.       4 still severe with movement 7. RECURRENT SYMPTOM: "Have you ever had this type of stomach pain before?" If Yes, ask: "When was the last time?" and "What happened that time?"      Yes years ago hernia repair 2013-  h/o small bowel  8. CAUSE: "What do you think is causing the stomach pain?"     unsure 9. RELIEVING/AGGRAVATING FACTORS: "What makes it better or worse?" (e.g., antacids, bending or twisting motion, bowel movement)     movement 10. OTHER SYMPTOMS: "Do you have any other symptoms?" (e.g., back pain, diarrhea, fever, urination pain, vomiting)       LUQ distended 11. PREGNANCY: "Is there any chance you are pregnant?" "When was your last menstrual period?"       N/a  Protocols used: Abdominal Pain - [facility]

## 2022-07-11 ENCOUNTER — Encounter: Payer: Self-pay | Admitting: Family Medicine

## 2022-07-28 ENCOUNTER — Telehealth: Payer: Self-pay | Admitting: Family Medicine

## 2022-07-28 NOTE — Telephone Encounter (Signed)
Called and clarified on when Trelegy was prescribed and for what use.

## 2022-07-28 NOTE — Telephone Encounter (Signed)
Morrie Sheldon with [employer] called saying they got a letter regarding the patients Trelegy Elipta and they need clarification.  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

## 2022-09-06 ENCOUNTER — Other Ambulatory Visit: Payer: Self-pay | Admitting: Family Medicine

## 2022-09-06 DIAGNOSIS — F325 Major depressive disorder, single episode, in full remission: Secondary | ICD-10-CM

## 2022-09-19 NOTE — Progress Notes (Unsigned)
Name: Kathleen Conley   MRN: [id number]    DOB: 03/14/1970   Date:09/20/2022       Progress Note  Subjective  Chief Complaint  Weight Loss  HPI  Major depression: when she came in last visit in January phq 9 was high again, we kept her dose of duloxetine at 60 mg and adjusted dose of wellbutrin to 300 mg daily, she is doing better now that is Summer months.  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 She has seasonal affective disorder but prefers not going down on dose of Wellbutrin at this time - I am feeling good   Metabolic Syndrome and Obesity/fatty liver : we tried giving her Trulicity but needs step therapy, she did not like  Metformin - caused diarrhea . She denies polyphagia, polyuria or polydipsia. last A1C was 6 %, we will recheck it today    Obesity: she has a long history of obesity, started after her divorce when she was in her 27's. She tried Weight Watchers but was unsuccessful, also tried low carbohydrate diet but unable to be consistent,  weight is stable but she wants help losing weight. BMI is above 40    FMS: she takes Duloxetine , could not tolerate Lyrica ( caused nightmares and vivid dreams) also stopped Gabapentin ( she was grinding her teeth), taking skelaxin three times a day and symptoms are stable, she needs refills again    Back pain : usually has low back pain, last Fall had thoracic pain, she is back to baseline pain, taking tylenol prn, skelaxin, no radiculitis lately . She states pain is both areas   OSA: she has mild symptoms, did not qualify for CPAP , avoiding sleeping on her back,  trying to sleep  on her side. She also has shift work sleep disorder - goes at 11 pm and leaves at 7 am and needs to take  nuvigil helps her stay awake   HTN: she is currently on Bystolic and Benicar and is doing well now, bp is at goal. She denies chest pain,  palpitation or  dizziness. Compliant    Leucocytosis and anemia: she has seen hematologist in the past and does not want to go back at this time. She has hidradenitis suppurative , last WBC was very high again but she was having a flare of hydradenitis , we will recheck labs during her CPE this Summer    Dyslipidemia: last LDL was 121 , HDL 45 , we will recheck labs today    The 10-year ASCVD risk score (Arnett DK, et al., 2019) is: 5.3%   Values used to calculate the score:     Age: 25 years     Sex: Female     Is Non-Hispanic African American: Yes     Diabetic: No     Tobacco smoker: No     Systolic Blood Pressure: 128 mmHg     Is BP treated: Yes     HDL Cholesterol: 45 mg/dL     Total Cholesterol: 197 mg/dL    AR: she states zyrtec works if she takes it daily, takes flonase prn, she needs a refill today    Patient Active Problem List   Diagnosis Date Noted   Postmenopausal 08/02/2021   Fatty liver 07/04/2021   Dyslipidemia 07/04/2021   History of iron deficiency 07/04/2021   Shift work sleep  disorder 07/04/2021   Spasm of thoracic back muscle 12/13/2020   Morbid obesity (HCC) 04/17/2018   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   Flat foot 01/28/2016   Aortic sclerosis 10/14/2015   Midline low back pain without sciatica 09/20/2015   Leukocytosis 11/29/2014   Osteoarthritis of left knee 11/20/2014   History of ventral hernia repair 10/27/2014   Allergic rhinitis 08/17/2014   Axillary hidradenitis suppurativa 08/17/2014   Baker cyst 08/17/2014   Chronic constipation 08/17/2014   Major depression in remission (HCC) 08/17/2014   Engages in travel abroad 08/17/2014   Fibromyalgia 08/17/2014   Cardiac murmur 08/17/2014   Dysmetabolic syndrome 08/17/2014   Plantar fasciitis 08/17/2014   Beat, premature ventricular 08/17/2014   Abnormal neurological finding suggestive of lumbar-level spinal disorder 08/17/2014   Obstructive apnea 12/02/2013   Essential (primary) hypertension  12/09/2009   Hypertrichosis 12/09/2009    Past Surgical History:  Procedure Laterality Date   ACHILLES TENDON SURGERY Left 08/15/2019   Procedure: TRIPLE ARTHRODESIS AND ACHILLES TENDON LENGTHENING LEFT FOOT;  Surgeon: [name], DPM;  Location: [facility];  Service: Podiatry;  Laterality: Left;   COLONOSCOPY WITH PROPOFOL N/A 07/23/2020   Procedure: COLONOSCOPY WITH PROPOFOL;  Surgeon: Pasty Spillers, MD;  Location: [facility];  Service: Endoscopy;  Laterality: N/A;   FOOT ARTHRODESIS Right 12/01/2016   Procedure: ARTHRODESIS FOOT; TRIPLE;  Surgeon: [name], DPM;  Location: [facility];  Service: Podiatry;  Laterality: Right;   HERNIA REPAIR  10/09/11   Ventral Incarcerated- Dr. [name]   OOPHORECTOMY Left 12/09/2009   TUBAL LIGATION  1993   VENTRAL HERNIA REPAIR  10/09/2011    Family History  Problem Relation Age of Onset   Hypertension Mother    CVA Mother    Kidney disease Mother    Congenital heart disease Mother    Heart disease Father    Hypertension Father    Diabetes Father    Breast cancer Paternal Grandmother        Bilateral   Colon cancer Maternal Grandfather    Colon cancer Maternal Uncle     Social History   Tobacco Use   Smoking status: Never   Smokeless tobacco: Never  Substance Use Topics   Alcohol use: No    Alcohol/week: 0.0 standard drinks of alcohol     Current Outpatient Medications:    acetaminophen (TYLENOL) 500 MG tablet, Take 1 tablet (500 mg total) by mouth every 6 (six) hours as needed. Max of 3 grams daily or 6 pills dialy (Patient taking differently: Take 1,000 mg by mouth every 6 (six) hours as needed for moderate pain or headache.), Disp: 30 tablet, Rfl: 0   aspirin EC 81 MG tablet, Take 1 tablet (81 mg total) by mouth daily., Disp: 30 tablet, Rfl: 0   cetirizine (ZYRTEC) 10 MG tablet, Take 10 mg by mouth every evening., Disp: , Rfl:    Cholecalciferol (VITAMIN D) 2000 UNITS tablet, Take 2,000 Units by mouth every evening. ,  Disp: , Rfl:    docusate sodium (COLACE) 100 MG capsule, Take 100 mg by mouth daily as needed for moderate constipation. , Disp: , Rfl:    ferrous sulfate 325 (65 FE) MG tablet, Take 325 mg by mouth every evening. , Disp: , Rfl:    Multiple Vitamin (MULTIVITAMIN WITH MINERALS) TABS tablet, Take 1 tablet by mouth every evening., Disp: , Rfl:    TRAVATAN Z 0.004 % SOLN ophthalmic solution, Place 1 drop into both eyes at bedtime., Disp: ,  Rfl: 3   Armodafinil (NUVIGIL) 150 MG tablet, Take 1 tablet (150 mg total) by mouth daily as needed (shift work--sleepiness)., Disp: 90 tablet, Rfl: 0   buPROPion (WELLBUTRIN XL) 300 MG 24 hr tablet, Take 1 tablet (300 mg total) by mouth daily., Disp: 90 tablet, Rfl: 1   DULoxetine (CYMBALTA) 60 MG capsule, Take 1 capsule (60 mg total) by mouth every evening., Disp: 90 capsule, Rfl: 1   fluticasone (FLONASE) 50 MCG/ACT nasal spray, Place 2 sprays into both nostrils daily as needed for allergies., Disp: 48 g, Rfl: 0   metaxalone (SKELAXIN) 800 MG tablet, Take 1 tablet (800 mg total) by mouth 3 (three) times daily as needed for muscle spasms., Disp: 270 tablet, Rfl: 0   nebivolol (BYSTOLIC) 5 MG tablet, Take 1 tablet (5 mg total) by mouth daily., Disp: 90 tablet, Rfl: 1   olmesartan (BENICAR) 40 MG tablet, Take 1 tablet (40 mg total) by mouth daily., Disp: 90 tablet, Rfl: 1  Allergies  Allergen Reactions   Lisinopril Cough        Norvasc [Amlodipine Besylate]    Hctz [Hydrochlorothiazide] Other (See Comments)    Burning sensation on her feet     I personally reviewed active problem list, medication list, allergies, family history, social history, health maintenance with the patient/caregiver today.   ROS  Constitutional: Negative for fever or weight change.  Respiratory: Negative for cough and shortness of breath.   Cardiovascular: Negative for chest pain or palpitations.  Gastrointestinal: Negative for abdominal pain, no bowel changes.  Musculoskeletal:  Negative for gait problem or joint swelling.  Skin: Negative for rash.  Neurological: Negative for dizziness or headache.  No other specific complaints in a complete review of systems (except as listed in HPI above).   Objective  Vitals:   09/20/22 0808  BP: 128/80  Pulse: 83  Resp: 16  Temp: 97.7 F (36.5 C)  TempSrc: Oral  SpO2: 96%  Weight: 230 lb 12.8 oz (104.7 kg)  Height: 5\' 2"  (1.575 m)    Body mass index is 42.21 kg/m.  Physical Exam  Constitutional: Patient appears well-developed and well-nourished. Obese  No distress.  HEENT: head atraumatic, normocephalic, pupils equal and reactive to light, neck supple, throat within normal limits Cardiovascular: Normal rate, regular rhythm and normal heart sounds.  No murmur heard. No BLE edema. Pulmonary/Chest: Effort normal and breath sounds normal. No respiratory distress. Abdominal: Soft.  There is no tenderness. Psychiatric: Patient has a normal mood and affect. behavior is normal. Judgment and thought content normal.    PHQ2/9:    06/12/2022   11:58 AM 03/13/2022   10:14 AM 01/09/2022    1:20 PM 01/03/2022    1:32 PM 11/09/2021    9:44 AM  Depression screen PHQ 2/9  Decreased Interest 0 2 0 0 0  Down, Depressed, Hopeless 0 2 0 0 0  PHQ - 2 Score 0 4 0 0 0  Altered sleeping 0 2 0 0   Tired, decreased energy 0 2 0 0   Change in appetite 0 0 0 0   Feeling bad or failure about yourself  0 2 0 0   Trouble concentrating 0 2 0 0   Moving slowly or fidgety/restless 0 0 0 0   Suicidal thoughts 0 0 0 0   PHQ-9 Score 0 12 0 0   Difficult doing work/chores  Somewhat difficult Not difficult at all Not difficult at all     phq 9 is negative  Fall Risk:    09/20/2022    8:09 AM 06/12/2022   11:58 AM 03/13/2022   10:13 AM 01/09/2022    1:20 PM 01/03/2022    1:32 PM  Fall Risk   Falls in the past year? 0 1 0 0 0  Number falls in past yr:  0  0 0  Injury with Fall?  0  0 0  Risk for fall due to : No Fall Risks History  of fall(s) No Fall Risks    Follow up Falls prevention discussed Falls prevention discussed;Education provided;Falls evaluation completed Falls evaluation completed;Education provided;Falls prevention discussed        Functional Status Survey: Is the patient deaf or have difficulty hearing?: No Does the patient have difficulty seeing, even when wearing glasses/contacts?: Yes Does the patient have difficulty concentrating, remembering, or making decisions?: No Does the patient have difficulty walking or climbing stairs?: Yes Does the patient have difficulty dressing or bathing?: No Does the patient have difficulty doing errands alone such as visiting a doctor's office or shopping?: No    Assessment & Plan  1. Fibromyalgia  - Armodafinil (NUVIGIL) 150 MG tablet; Take 1 tablet (150 mg total) by mouth daily as needed (shift work--sleepiness).  Dispense: 90 tablet; Refill: 0 - DULoxetine (CYMBALTA) 60 MG capsule; Take 1 capsule (60 mg total) by mouth every evening.  Dispense: 90 capsule; Refill: 1 - metaxalone (SKELAXIN) 800 MG tablet; Take 1 tablet (800 mg total) by mouth 3 (three) times daily as needed for muscle spasms.  Dispense: 270 tablet; Refill: 0  2. Shift work sleep disorder  - Armodafinil (NUVIGIL) 150 MG tablet; Take 1 tablet (150 mg total) by mouth daily as needed (shift work--sleepiness).  Dispense: 90 tablet; Refill: 0  3. Obstructive apnea  - Armodafinil (NUVIGIL) 150 MG tablet; Take 1 tablet (150 mg total) by mouth daily as needed (shift work--sleepiness).  Dispense: 90 tablet; Refill: 0  4. Major depression in remission (HCC)  - buPROPion (WELLBUTRIN XL) 300 MG 24 hr tablet; Take 1 tablet (300 mg total) by mouth daily.  Dispense: 90 tablet; Refill: 1 - DULoxetine (CYMBALTA) 60 MG capsule; Take 1 capsule (60 mg total) by mouth every evening.  Dispense: 90 capsule; Refill: 1  5. Seasonal allergic rhinitis, unspecified trigger  - fluticasone (FLONASE) 50 MCG/ACT nasal  spray; Place 2 sprays into both nostrils daily as needed for allergies.  Dispense: 48 g; Refill: 0  6. Essential (primary) hypertension  - nebivolol (BYSTOLIC) 5 MG tablet; Take 1 tablet (5 mg total) by mouth daily.  Dispense: 90 tablet; Refill: 1 - olmesartan (BENICAR) 40 MG tablet; Take 1 tablet (40 mg total) by mouth daily.  Dispense: 90 tablet; Refill: 1 - COMPLETE METABOLIC PANEL WITH GFR - CBC with Differential/Platelet  7. Morbid obesity (HCC)  - tirzepatide (ZEPBOUND) 2.5 MG/0.5ML Pen; Inject 2.5 mg into the skin once a week.  Dispense: 2 mL; Refill: 0  8. Dysmetabolic syndrome  - Hemoglobin A1c  9. History of iron deficiency  - CBC with Differential/Platelet - Iron, TIBC and Ferritin Panel  10. Fatty liver  Recheck labs, discussed healthier diet   11. Dyslipidemia  - Lipid panel

## 2022-09-20 ENCOUNTER — Encounter: Payer: Self-pay | Admitting: Family Medicine

## 2022-09-20 ENCOUNTER — Ambulatory Visit (INDEPENDENT_AMBULATORY_CARE_PROVIDER_SITE_OTHER): Payer: PRIVATE HEALTH INSURANCE | Admitting: Family Medicine

## 2022-09-20 DIAGNOSIS — F325 Major depressive disorder, single episode, in full remission: Secondary | ICD-10-CM

## 2022-09-20 DIAGNOSIS — G4733 Obstructive sleep apnea (adult) (pediatric): Secondary | ICD-10-CM | POA: Diagnosis not present

## 2022-09-20 DIAGNOSIS — G4726 Circadian rhythm sleep disorder, shift work type: Secondary | ICD-10-CM | POA: Diagnosis not present

## 2022-09-20 DIAGNOSIS — E8881 Metabolic syndrome: Secondary | ICD-10-CM

## 2022-09-20 DIAGNOSIS — M797 Fibromyalgia: Secondary | ICD-10-CM | POA: Diagnosis not present

## 2022-09-20 DIAGNOSIS — Z8639 Personal history of other endocrine, nutritional and metabolic disease: Secondary | ICD-10-CM

## 2022-09-20 DIAGNOSIS — I10 Essential (primary) hypertension: Secondary | ICD-10-CM

## 2022-09-20 DIAGNOSIS — E785 Hyperlipidemia, unspecified: Secondary | ICD-10-CM

## 2022-09-20 DIAGNOSIS — K760 Fatty (change of) liver, not elsewhere classified: Secondary | ICD-10-CM

## 2022-09-20 DIAGNOSIS — J302 Other seasonal allergic rhinitis: Secondary | ICD-10-CM

## 2022-09-20 MED ORDER — OLMESARTAN MEDOXOMIL 40 MG PO TABS: 40.0000 mg | ORAL_TABLET | Freq: Every day | ORAL | 1 refills | Status: DC

## 2022-09-20 MED ORDER — BUPROPION HCL ER (XL) 300 MG PO TB24: 300.0000 mg | ORAL_TABLET | Freq: Every day | ORAL | 1 refills | Status: DC

## 2022-09-20 MED ORDER — METAXALONE 800 MG PO TABS: 800.0000 mg | ORAL_TABLET | Freq: Three times a day (TID) | ORAL | 0 refills | Status: DC | PRN

## 2022-09-20 MED ORDER — DULOXETINE HCL 60 MG PO CPEP: 60.0000 mg | ORAL_CAPSULE | Freq: Every evening | ORAL | 1 refills | Status: DC

## 2022-09-20 MED ORDER — ZEPBOUND 2.5 MG/0.5ML ~~LOC~~ SOAJ: 2.5000 mg | SUBCUTANEOUS | 0 refills | Status: DC

## 2022-09-20 MED ORDER — ARMODAFINIL 150 MG PO TABS: 150.0000 mg | ORAL_TABLET | Freq: Every day | ORAL | 0 refills | Status: DC | PRN

## 2022-09-20 MED ORDER — FLUTICASONE PROPIONATE 50 MCG/ACT NA SUSP: 2.0000 | Freq: Every day | NASAL | 0 refills | Status: DC | PRN

## 2022-09-20 MED ORDER — NEBIVOLOL HCL 5 MG PO TABS: 5.0000 mg | ORAL_TABLET | Freq: Every day | ORAL | 1 refills | Status: DC

## 2022-09-21 LAB — CBC WITH DIFFERENTIAL/PLATELET
Absolute Monocytes: 845 cells/uL (ref 200–950)
Basophils Absolute: 41 cells/uL (ref 0–200)
Basophils Relative: 0.4 %
Eosinophils Absolute: 278 cells/uL (ref 15–500)
Eosinophils Relative: 2.7 %
HCT: 37.8 % (ref 35.0–45.0)
Hemoglobin: 12.2 g/dL (ref 11.7–15.5)
Lymphs Abs: 3193 cells/uL (ref 850–3900)
MCH: 27.4 pg (ref 27.0–33.0)
MCHC: 32.3 g/dL (ref 32.0–36.0)
MCV: 84.8 fL (ref 80.0–100.0)
MPV: 9.2 fL (ref 7.5–12.5)
Monocytes Relative: 8.2 %
Neutro Abs: 5943 cells/uL (ref 1500–7800)
Neutrophils Relative %: 57.7 %
Platelets: 358 10*3/uL (ref 140–400)
RBC: 4.46 10*6/uL (ref 3.80–5.10)
RDW: 14 % (ref 11.0–15.0)
Total Lymphocyte: 31 %
WBC: 10.3 10*3/uL (ref 3.8–10.8)

## 2022-09-21 LAB — COMPLETE METABOLIC PANEL WITH GFR
AG Ratio: 1.5 (calc) (ref 1.0–2.5)
ALT: 13 U/L (ref 6–29)
AST: 14 U/L (ref 10–35)
Albumin: 4.2 g/dL (ref 3.6–5.1)
Alkaline phosphatase (APISO): 67 U/L (ref 37–153)
BUN: 12 mg/dL (ref 7–25)
CO2: 30 mmol/L (ref 20–32)
Calcium: 9.3 mg/dL (ref 8.6–10.4)
Chloride: 105 mmol/L (ref 98–110)
Creat: 0.64 mg/dL (ref 0.50–1.03)
Globulin: 2.8 g/dL (calc) (ref 1.9–3.7)
Glucose, Bld: 87 mg/dL (ref 65–99)
Potassium: 4.3 mmol/L (ref 3.5–5.3)
Sodium: 142 mmol/L (ref 135–146)
Total Bilirubin: 0.2 mg/dL (ref 0.2–1.2)
Total Protein: 7 g/dL (ref 6.1–8.1)
eGFR: 106 mL/min/{1.73_m2} (ref >=60)

## 2022-09-21 LAB — LIPID PANEL
Cholesterol: 186 mg/dL (ref <200)
HDL: 50 mg/dL (ref >=50)
LDL Cholesterol (Calc): 113 mg/dL (calc) — ABNORMAL HIGH
Non-HDL Cholesterol (Calc): 136 mg/dL (calc) — ABNORMAL HIGH (ref <130)
Total CHOL/HDL Ratio: 3.7 (calc) (ref <5.0)
Triglycerides: 124 mg/dL (ref <150)

## 2022-09-21 LAB — IRON,TIBC AND FERRITIN PANEL
%SAT: 13 % (calc) — ABNORMAL LOW (ref 16–45)
Iron: 45 ug/dL (ref 45–160)
TIBC: 358 mcg/dL (calc) (ref 250–450)

## 2022-11-01 ENCOUNTER — Encounter: Payer: Self-pay | Admitting: Family Medicine

## 2022-12-20 NOTE — Progress Notes (Unsigned)
Name: Kathleen Conley   MRN: [id number]    DOB: [birth year]   Date:12/21/2022       Progress Note  Subjective  Chief Complaint  Follow Up  HPI  Major depression: when she came in last visit in January phq 9 was high again, we kept her dose of duloxetine at 60 mg and adjusted dose of wellbutrin to 300 mg daily, she is doing better now that is Summer months.  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 She has seasonal affective disorder and we used to adjust dose of Wellbutrin but this year she chose to stay on higher dose , we will monitor    Metabolic Syndrome and Obesity/fatty liver : we tried giving her Trulicity but needs step therapy, she did not like  Metformin - caused diarrhea . She denies polyphagia, polyuria or polydipsia. last A1C was 6 %, we will recheck it today    Obesity: she has a long history of obesity, started after her divorce when she was in her 66's. She tried Weight Watchers but was unsuccessful, also tried low carbohydrate diet but unable to be consistent, insurance is not paying for medications. Weight is one pound lower today    FMS: she takes Duloxetine , could not tolerate Lyrica ( caused nightmares and vivid dreams) also stopped Gabapentin ( she was grinding her teeth), taking skelaxin three times a day and symptoms are stable at this time   Back pain : chronic and stable, controlled  taking tylenol prn, and skelaxin, no radiculitis lately .    OSA: she has mild symptoms, did not qualify for CPAP , avoiding sleeping on her back,  trying to sleep  on her side. She also has shift work sleep disorder - goes at 11 pm and leaves at 7 am and needs to take  nuvigil helps her stay awake while at work    HTN: she is currently on [employer], site and is doing well now, She denies chest pain,  palpitation or dizziness. BP is at goal    Leucocytosis and anemia: she has seen  hematologist in the past and does not want to go back at this time. She has hidradenitis suppurative , last WBC had improved again    Dyslipidemia: last LDL was 121 , HDL 45 , we will recheck labs today    The 10-year ASCVD risk score (Arnett DK, et al., 2019) is: 4.2%   Values used to calculate the score:     Age: 52 years     Sex: Female     Is Non-Hispanic African American: Yes     Diabetic: No     Tobacco smoker: No     Systolic Blood Pressure: 126 mmHg     Is BP treated: Yes     HDL Cholesterol: 50 mg/dL     Total Cholesterol: 186 mg/dL    AR: she states zyrtec works if she takes it daily, takes flonase prn. Stable  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   Flat foot 01/28/2016   Aortic sclerosis 10/14/2015  Midline low back pain without sciatica 09/20/2015   Leukocytosis 11/29/2014   Osteoarthritis of left knee 11/20/2014   History of ventral hernia repair 10/27/2014   Allergic rhinitis 08/17/2014   Axillary hidradenitis suppurativa 08/17/2014   Baker cyst 08/17/2014   Chronic constipation 08/17/2014   Major depression in remission (HCC) 08/17/2014   Engages in travel abroad 08/17/2014   Fibromyalgia 08/17/2014   Cardiac murmur 08/17/2014   Dysmetabolic syndrome 08/17/2014   Plantar fasciitis 08/17/2014   Beat, premature ventricular 08/17/2014   Abnormal neurological finding suggestive of lumbar-level spinal disorder 08/17/2014   Obstructive apnea 12/02/2013   Essential (primary) hypertension 12/09/2009   Hypertrichosis 12/09/2009    Past Surgical History:  Procedure Laterality Date   ACHILLES TENDON SURGERY Left 08/15/2019   Procedure: TRIPLE ARTHRODESIS AND ACHILLES  TENDON LENGTHENING LEFT FOOT;  Surgeon: [name], DPM;  Location: [facility];  Service: Podiatry;  Laterality: Left;   COLONOSCOPY WITH PROPOFOL N/A 07/23/2020   Procedure: COLONOSCOPY WITH PROPOFOL;  Surgeon: Pasty Spillers, MD;  Location: [facility];  Service: Endoscopy;  Laterality: N/A;   FOOT ARTHRODESIS Right 12/01/2016   Procedure: ARTHRODESIS FOOT; TRIPLE;  Surgeon: [name], DPM;  Location: [facility];  Service: Podiatry;  Laterality: Right;   HERNIA REPAIR  10/09/11   Ventral Incarcerated- Dr. [name]   OOPHORECTOMY Left 12/09/2009   TUBAL LIGATION  1993   VENTRAL HERNIA REPAIR  10/09/2011    Family History  Problem Relation Age of Onset   Hypertension Mother    CVA Mother    Kidney disease Mother    Congenital heart disease Mother    Heart disease Father    Hypertension Father    Diabetes Father    Breast cancer Paternal Grandmother        Bilateral   Colon cancer Maternal Grandfather    Colon cancer Maternal Uncle     Social History   Tobacco Use   Smoking status: Never   Smokeless tobacco: Never  Substance Use Topics   Alcohol use: No    Alcohol/week: 0.0 standard drinks of alcohol     Current Outpatient Medications:    acetaminophen (TYLENOL) 500 MG tablet, Take 1 tablet (500 mg total) by mouth every 6 (six) hours as needed. Max of 3 grams daily or 6 pills dialy (Patient taking differently: Take 1,000 mg by mouth every 6 (six) hours as needed for moderate pain (pain score 4-6) or headache.), Disp: 30 tablet, Rfl: 0   aspirin EC 81 MG tablet, Take 1 tablet (81 mg total) by mouth daily., Disp: 30 tablet, Rfl: 0   buPROPion (WELLBUTRIN XL) 300 MG 24 hr tablet, Take 1 tablet (300 mg total) by mouth daily., Disp: 90 tablet, Rfl: 1   cetirizine (ZYRTEC) 10 MG tablet, Take 10 mg by mouth every evening., Disp: , Rfl:    Cholecalciferol (VITAMIN D) 2000 UNITS tablet, Take 2,000 Units by mouth every evening. , Disp: , Rfl:    docusate sodium (COLACE) 100 MG  capsule, Take 100 mg by mouth daily as needed for moderate constipation. , Disp: , Rfl:    DULoxetine (CYMBALTA) 60 MG capsule, Take 1 capsule (60 mg total) by mouth every evening., Disp: 90 capsule, Rfl: 1   ferrous sulfate 325 (65 FE) MG tablet, Take 325 mg by mouth every evening. , Disp: , Rfl:    fluticasone (FLONASE) 50 MCG/ACT nasal spray, Place 2 sprays into both nostrils daily as needed for allergies., Disp: 48 g, Rfl: 0  Multiple Vitamin (MULTIVITAMIN WITH MINERALS) TABS tablet, Take 1 tablet by mouth every evening., Disp: , Rfl:    nebivolol (BYSTOLIC) 5 MG tablet, Take 1 tablet (5 mg total) by mouth daily., Disp: 90 tablet, Rfl: 1   olmesartan (BENICAR) 40 MG tablet, Take 1 tablet (40 mg total) by mouth daily., Disp: 90 tablet, Rfl: 1   TRAVATAN Z 0.004 % SOLN ophthalmic solution, Place 1 drop into both eyes at bedtime., Disp: , Rfl: 3   Armodafinil (NUVIGIL) 150 MG tablet, Take 1 tablet (150 mg total) by mouth daily as needed (shift work--sleepiness)., Disp: 90 tablet, Rfl: 0   metaxalone (SKELAXIN) 800 MG tablet, Take 1 tablet (800 mg total) by mouth 3 (three) times daily as needed for muscle spasms., Disp: 270 tablet, Rfl: 0  Allergies  Allergen Reactions   Lisinopril Cough        Norvasc [Amlodipine Besylate]    Hctz [Hydrochlorothiazide] Other (See Comments)    Burning sensation on her feet     I personally reviewed active problem list, medication list, allergies, family history, social history, health maintenance with the patient/caregiver today.   ROS  Ten systems reviewed and is negative except as mentioned in HPI    Objective  Vitals:   12/21/22 0916  BP: 126/80  Pulse: 88  Resp: 18  Temp: 98.3 F (36.8 C)  TempSrc: Oral  SpO2: 95%  Weight: 229 lb 12.8 oz (104.2 kg)  Height: 5\' 2"  (1.575 m)    Body mass index is 42.03 kg/m.  Physical Exam  Constitutional: Patient appears well-developed and well-nourished. Obese  No distress.  HEENT: head  atraumatic, normocephalic, pupils equal and reactive to light, neck supple Cardiovascular: Normal rate, regular rhythm and normal heart sounds.  No murmur heard. No BLE edema. Pulmonary/Chest: Effort normal and breath sounds normal. No respiratory distress. Abdominal: Soft.  There is no tenderness. Psychiatric: Patient has a normal mood and affect. behavior is normal. Judgment and thought content normal.    PHQ2/9:    12/21/2022    9:17 AM 09/20/2022    8:24 AM 06/12/2022   11:58 AM 03/13/2022   10:14 AM 01/09/2022    1:20 PM  Depression screen PHQ 2/9  Decreased Interest 0 0 0 2 0  Down, Depressed, Hopeless 0 0 0 2 0  PHQ - 2 Score 0 0 0 4 0  Altered sleeping 0 0 0 2 0  Tired, decreased energy 0 0 0 2 0  Change in appetite 0 1 0 0 0  Feeling bad or failure about yourself  0 0 0 2 0  Trouble concentrating 0 0 0 2 0  Moving slowly or fidgety/restless 0 0 0 0 0  Suicidal thoughts 0 0 0 0 0  PHQ-9 Score 0 1 0 12 0  Difficult doing work/chores  Not difficult at all  Somewhat difficult Not difficult at all    phq 9 is negative   Fall Risk:    12/21/2022    9:17 AM 09/20/2022    8:09 AM 06/12/2022   11:58 AM 03/13/2022   10:13 AM 01/09/2022    1:20 PM  Fall Risk   Falls in the past year? 1 0 1 0 0  Number falls in past yr: 0  0  0  Injury with Fall? 0  0  0  Risk for fall due to : History of fall(s) No Fall Risks History of fall(s) No Fall Risks   Follow up Falls prevention discussed;Education provided;Falls evaluation  completed Falls prevention discussed Falls prevention discussed;Education provided;Falls evaluation completed Falls evaluation completed;Education provided;Falls prevention discussed       Functional Status Survey: Is the patient deaf or have difficulty hearing?: No Does the patient have difficulty seeing, even when wearing glasses/contacts?: Yes Does the patient have difficulty concentrating, remembering, or making decisions?: No Does the patient have  difficulty walking or climbing stairs?: Yes Does the patient have difficulty dressing or bathing?: No Does the patient have difficulty doing errands alone such as visiting a doctor's office or shopping?: No    Assessment & Plan  1. Major depression in remission (HCC)  Continue medication   2. Morbid obesity (HCC)  Discussed with the patient the risk posed by an increased BMI. Discussed importance of portion control, calorie counting and at least 150 minutes of physical activity weekly. Avoid sweet beverages and drink more water. Eat at least 6 servings of fruit and vegetables daily    3. Fibromyalgia  - metaxalone (SKELAXIN) 800 MG tablet; Take 1 tablet (800 mg total) by mouth 3 (three) times daily as needed for muscle spasms.  Dispense: 270 tablet; Refill: 0 - Armodafinil (NUVIGIL) 150 MG tablet; Take 1 tablet (150 mg total) by mouth daily as needed (shift work--sleepiness).  Dispense: 90 tablet; Refill: 0  4. Shift work sleep disorder  - Armodafinil (NUVIGIL) 150 MG tablet; Take 1 tablet (150 mg total) by mouth daily as needed (shift work--sleepiness).  Dispense: 90 tablet; Refill: 0  5. Obstructive apnea  - Armodafinil (NUVIGIL) 150 MG tablet; Take 1 tablet (150 mg total) by mouth daily as needed (shift work--sleepiness).  Dispense: 90 tablet; Refill: 0   6. Need for immunization against influenza  - Flu vaccine trivalent PF, 6mos and older(Flulaval,Afluria,Fluarix,Fluzone)  7. Essential (primary) hypertension  BP is controlled   8. Dysmetabolic syndrome   Continue life style modification

## 2022-12-21 ENCOUNTER — Encounter: Payer: Self-pay | Admitting: Family Medicine

## 2022-12-21 ENCOUNTER — Ambulatory Visit (INDEPENDENT_AMBULATORY_CARE_PROVIDER_SITE_OTHER): Payer: PRIVATE HEALTH INSURANCE | Admitting: Family Medicine

## 2022-12-21 VITALS — BP 126/80 | HR 88 | Temp 98.3°F | Resp 18 | Ht 62.0 in | Wt 229.8 lb

## 2022-12-21 DIAGNOSIS — G4733 Obstructive sleep apnea (adult) (pediatric): Secondary | ICD-10-CM

## 2022-12-21 DIAGNOSIS — M797 Fibromyalgia: Secondary | ICD-10-CM | POA: Diagnosis not present

## 2022-12-21 DIAGNOSIS — Z23 Encounter for immunization: Secondary | ICD-10-CM | POA: Diagnosis not present

## 2022-12-21 DIAGNOSIS — F325 Major depressive disorder, single episode, in full remission: Secondary | ICD-10-CM | POA: Diagnosis not present

## 2022-12-21 DIAGNOSIS — G4726 Circadian rhythm sleep disorder, shift work type: Secondary | ICD-10-CM | POA: Diagnosis not present

## 2022-12-21 DIAGNOSIS — I10 Essential (primary) hypertension: Secondary | ICD-10-CM

## 2022-12-21 DIAGNOSIS — E8881 Metabolic syndrome: Secondary | ICD-10-CM

## 2022-12-21 MED ORDER — METAXALONE 800 MG PO TABS: 800.0000 mg | ORAL_TABLET | Freq: Three times a day (TID) | ORAL | 0 refills | Status: DC | PRN

## 2022-12-21 MED ORDER — ARMODAFINIL 150 MG PO TABS: 150.0000 mg | ORAL_TABLET | Freq: Every day | ORAL | 0 refills | Status: DC | PRN

## 2023-02-26 ENCOUNTER — Encounter: Payer: Self-pay | Admitting: Family Medicine

## 2023-02-28 ENCOUNTER — Telehealth: Payer: PRIVATE HEALTH INSURANCE | Admitting: Physician Assistant

## 2023-02-28 DIAGNOSIS — U071 COVID-19: Secondary | ICD-10-CM

## 2023-02-28 MED ORDER — ALBUTEROL SULFATE HFA 108 (90 BASE) MCG/ACT IN AERS: 2.0000 | INHALATION_SPRAY | Freq: Four times a day (QID) | RESPIRATORY_TRACT | 0 refills | Status: DC | PRN

## 2023-02-28 MED ORDER — NIRMATRELVIR/RITONAVIR (PAXLOVID)TABLET: 3.0000 | ORAL_TABLET | Freq: Two times a day (BID) | ORAL | 0 refills | Status: AC

## 2023-02-28 NOTE — Patient Instructions (Signed)

## 2023-02-28 NOTE — Progress Notes (Signed)
 Virtual Visit via Video Note  I connected with Kathleen Conley on 02/28/23 at  9:00 AM EST by a video enabled telemedicine application and verified that I am speaking with the correct person using two identifiers.  Today's Provider: [place], MHS, PA-C Introduced myself to the patient as a PA-C and provided education on APPs in clinical practice.   Location: Patient: at home  Provider: [facility], [place], [place]    I discussed the limitations of evaluation and management by telemedicine and the availability of in person appointments. The patient expressed understanding and agreed to proceed.  Chief Complaint  Patient presents with   Cough    w/wheezing   Sore Throat    x2 days   Covid Positive    Home test positive 02/26/23     History of Present Illness:   She reports her symptoms started on Monday 02/26/23 and she has since tested positive for COVID both at home and at work   She is interested in paxlovid  to help with her symptoms  She has been taking Tylenol  so far but has plans to get Coricidin delivered   Review of Systems  Constitutional:  Positive for chills and malaise/fatigue.  HENT:  Positive for congestion and sore throat.   Respiratory:  Positive for cough, sputum production, shortness of breath and wheezing.   Musculoskeletal:  Positive for myalgias.    Observations/Objective:  Due to the nature of the virtual visit, physical exam and observations are limited. Able to obtain the following observations:   Alert, oriented, x3 Appears comfortable, in no acute distress.  No scleral injection, no appreciated hoarseness, tachypnea, wheeze or strider. Able to maintain conversation without visible strain.   cough appreciated during visit. Patient sounds congested    Assessment and Plan:  Problem List Items Addressed This Visit   None Visit Diagnoses       COVID-19    -  Primary   Relevant Medications   albuterol  (VENTOLIN  HFA) 108 (90  Base) MCG/ACT inhaler   nirmatrelvir /ritonavir  (PAXLOVID ) 20 x 150 MG & 10 x 100MG  TABS      Acute, new concern Patient had upper respiratory symptoms starting on 02/26/23 and then tested positive for COVID-19 both at home and at work She is amenable to starting Paxlovid  and would like an inhaler to assist with wheezing - reviewed current medication list with UTD drug interaction tool - reviewed that Paxlovid  may decrease serum concentration of Bupropion  which may cause some mood symptoms but she should not have to make adjustments to chronic regimen Reviewed that she can use Mucinex  and Robitussin (regular formulations) and tylenol  to assist with symptom management  Reviewed ED and return precautions Follow up as needed for persistent or progressing symptoms     Follow Up Instructions:    I discussed the assessment and treatment plan with the patient. The patient was provided an opportunity to ask questions and all were answered. The patient agreed with the plan and demonstrated an understanding of the instructions.   The patient was advised to call back or seek an in-person evaluation if the symptoms worsen or if the condition fails to improve as anticipated.  I provided 8 minutes of non-face-to-face time during this encounter.   No follow-ups on file.   I, Evangelia Whitaker E Hyde Sires, PA-C, have reviewed all documentation for this visit. The documentation on 02/28/23 for the exam, diagnosis, procedures, and orders are all accurate and complete.   [place], MHS, PA-C [facility]

## 2023-03-22 ENCOUNTER — Ambulatory Visit (INDEPENDENT_AMBULATORY_CARE_PROVIDER_SITE_OTHER): Payer: PRIVATE HEALTH INSURANCE | Admitting: Family Medicine

## 2023-03-22 ENCOUNTER — Encounter: Payer: Self-pay | Admitting: Family Medicine

## 2023-03-22 VITALS — BP 150/84 | HR 84 | Resp 16 | Ht 62.0 in | Wt 231.1 lb

## 2023-03-22 DIAGNOSIS — M797 Fibromyalgia: Secondary | ICD-10-CM

## 2023-03-22 DIAGNOSIS — F339 Major depressive disorder, recurrent, unspecified: Secondary | ICD-10-CM

## 2023-03-22 DIAGNOSIS — I10 Essential (primary) hypertension: Secondary | ICD-10-CM

## 2023-03-22 DIAGNOSIS — G4733 Obstructive sleep apnea (adult) (pediatric): Secondary | ICD-10-CM

## 2023-03-22 DIAGNOSIS — F338 Other recurrent depressive disorders: Secondary | ICD-10-CM

## 2023-03-22 DIAGNOSIS — N39490 Overflow incontinence: Secondary | ICD-10-CM

## 2023-03-22 DIAGNOSIS — G4726 Circadian rhythm sleep disorder, shift work type: Secondary | ICD-10-CM | POA: Diagnosis not present

## 2023-03-22 DIAGNOSIS — E8881 Metabolic syndrome: Secondary | ICD-10-CM

## 2023-03-22 MED ORDER — BUPROPION HCL ER (XL) 300 MG PO TB24
300.0000 mg | ORAL_TABLET | Freq: Every day | ORAL | 1 refills | Status: DC
  Filled 2023-07-12: qty 90, 90d supply, fill #0

## 2023-03-22 MED ORDER — ESCITALOPRAM OXALATE 10 MG PO TABS: 10.0000 mg | ORAL_TABLET | Freq: Every day | ORAL | 0 refills | Status: DC

## 2023-03-22 MED ORDER — OLMESARTAN MEDOXOMIL 40 MG PO TABS: 40.0000 mg | ORAL_TABLET | Freq: Every day | ORAL | 1 refills | Status: DC

## 2023-03-22 MED ORDER — METAXALONE 800 MG PO TABS: 800.0000 mg | ORAL_TABLET | Freq: Three times a day (TID) | ORAL | 0 refills | Status: DC | PRN

## 2023-03-22 MED ORDER — ARMODAFINIL 150 MG PO TABS: 150.0000 mg | ORAL_TABLET | Freq: Every day | ORAL | 0 refills | Status: DC | PRN

## 2023-03-22 MED ORDER — NEBIVOLOL HCL 10 MG PO TABS
10.0000 mg | ORAL_TABLET | Freq: Every day | ORAL | 1 refills | Status: DC
  Filled 2023-07-06: qty 90, 90d supply, fill #0

## 2023-03-22 NOTE — Progress Notes (Signed)
Name: Kathleen Conley   MRN: [id number]    DOB: 04-Aug-1970   Date:03/22/2023       Progress Note  Subjective  Chief Complaint  Chief Complaint  Patient presents with   Medical Management of Chronic Issues    Has not taken BP pill this am   HPI   Major depression Recurrent and Seasonal affective disorder: she is off Duloxetine, stopped on her own - felt like it started to cause somnolence even when she took it at night, stopped taking about one month ago, taking wellbutrin in am, feeling more anxious, wants to find another job , feels overwhelmed. She also has seasonal affective disorder and wondering if that was the cause of somnolence. We will try lexapro instead of duloxetine, continue wellbutrin.   Overflow incontinence: going on for years but getting worse, happens when going from sitting to standing but lately also when walking. No dysuria or hematuria.    Metabolic Syndrome and Obesity/fatty liver : we tried giving her Trulicity but needs step therapy, she did not like  Metformin - caused diarrhea . She denies polyphagia, polyuria or polydipsia. Reminded her of low carbohydrate it  Obesity: she has a long history of obesity, started after her divorce when she was in her 65's. She tried Weight Watchers but was unsuccessful, also tried low carbohydrate diet but unable to be consistent, insurance is not paying for medications. Weight is trending up    WUJ:WJXBJ not tolerate Lyrica ( caused nightmares and vivid dreams) also stopped Gabapentin ( she was grinding her teeth), taking skelaxin three times a day and symptoms are stable at this time. She stopped taking Duloxetine about one month ago because she started to feel sleepy on medication, even though she has been taking it for years   Back pain : chronic controlled  taking tylenol prn, and skelaxin, no radiculitis lately . She states seems to be getting worse, cannot walk for exercise due to increase in pain with activity, discussed  other forms to strengthen her back like elliptical    OSA: she has mild symptoms, did not qualify for CPAP , avoiding sleeping on her back,  trying to sleep  on her side. She also has shift work sleep disorder - goes at 11 pm and leaves at 7 am and needs to take  nuvigil helps her stay awake while at work, she [employer] is requiring a PA and we will send another rx to try to get her eligible to take medications   HTN: she is currently on Bystolic 5 mg and Benicar  40 mg BP today is elevated. She denies chest pain,  palpitation or dizziness. We will adjust bystolic to 10 mg today, return in 2 weeks for bp check    Leucocytosis and anemia: she has seen hematologist in the past and does not want to go back at this time. She has hidradenitis suppurative. Last WBC was normal    Dyslipidemia:    The 10-year ASCVD risk score (Arnett DK, et al., 2019) is: 8.3%   Values used to calculate the score:     Age: 53 years     Sex: Female     Is Non-Hispanic African American: Yes     Diabetic: No     Tobacco smoker: No     Systolic Blood Pressure: 150 mmHg     Is BP treated: Yes     HDL Cholesterol: 50 mg/dL     Total Cholesterol: 186 mg/dL  AR: she states zyrtec works if she takes it daily, takes flonase prn.Unchanged   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   Flat foot 01/28/2016   Aortic sclerosis 10/14/2015   Midline low back pain without sciatica 09/20/2015   Leukocytosis 11/29/2014   Osteoarthritis of left knee 11/20/2014   History of ventral hernia repair 10/27/2014   Allergic rhinitis 08/17/2014   Axillary hidradenitis suppurativa 08/17/2014   Baker cyst 08/17/2014   Chronic  constipation 08/17/2014   Major depression in remission (HCC) 08/17/2014   Engages in travel abroad 08/17/2014   Fibromyalgia 08/17/2014   Cardiac murmur 08/17/2014   Dysmetabolic syndrome 08/17/2014   Plantar fasciitis 08/17/2014   Beat, premature ventricular 08/17/2014   Abnormal neurological finding suggestive of lumbar-level spinal disorder 08/17/2014   Obstructive apnea 12/02/2013   Essential (primary) hypertension 12/09/2009   Hypertrichosis 12/09/2009    Past Surgical History:  Procedure Laterality Date   ACHILLES TENDON SURGERY Left 08/15/2019   Procedure: TRIPLE ARTHRODESIS AND ACHILLES TENDON LENGTHENING LEFT FOOT;  Surgeon: [name], DPM;  Location: [facility];  Service: Podiatry;  Laterality: Left;   COLONOSCOPY WITH PROPOFOL N/A 07/23/2020   Procedure: COLONOSCOPY WITH PROPOFOL;  Surgeon: Pasty Spillers, MD;  Location: [facility];  Service: Endoscopy;  Laterality: N/A;   FOOT ARTHRODESIS Right 12/01/2016   Procedure: ARTHRODESIS FOOT; TRIPLE;  Surgeon: [name], DPM;  Location: [facility];  Service: Podiatry;  Laterality: Right;   HERNIA REPAIR  10/09/11   Ventral Incarcerated- Dr. [name]   OOPHORECTOMY Left 12/09/2009   TUBAL LIGATION  1993   VENTRAL HERNIA REPAIR  10/09/2011    Family History  Problem Relation Age of Onset   Hypertension Mother    CVA Mother    Kidney disease Mother    Congenital heart disease Mother    Heart disease Father    Hypertension Father    Diabetes Father    Breast cancer Paternal Grandmother        Bilateral   Colon cancer Maternal Grandfather    Colon cancer Maternal Uncle     Social History   Tobacco Use   Smoking status: Never   Smokeless tobacco: Never  Substance Use Topics   Alcohol use: No    Alcohol/week: 0.0 standard drinks of alcohol     Current Outpatient Medications:    acetaminophen (TYLENOL) 500 MG tablet, Take 1 tablet (500 mg total) by mouth every 6 (six) hours as needed. Max of 3 grams daily  or 6 pills dialy (Patient taking differently: Take 1,000 mg by mouth every 6 (six) hours as needed for moderate pain (pain score 4-6) or headache.), Disp: 30 tablet, Rfl: 0   albuterol (VENTOLIN HFA) 108 (90 Base) MCG/ACT inhaler, Inhale 2 puffs into the lungs every 6 (six) hours as needed for wheezing or shortness of breath., Disp: 8 g, Rfl: 0   aspirin EC 81 MG tablet, Take 1 tablet (81 mg total) by mouth daily., Disp: 30 tablet, Rfl: 0   cetirizine (ZYRTEC) 10 MG tablet, Take 10 mg by mouth every evening., Disp: , Rfl:    Cholecalciferol (VITAMIN D) 2000 UNITS tablet, Take 2,000 Units by mouth every evening. , Disp: ,  Rfl:    docusate sodium (COLACE) 100 MG capsule, Take 100 mg by mouth daily as needed for moderate constipation. , Disp: , Rfl:    escitalopram (LEXAPRO) 10 MG tablet, Take 1 tablet (10 mg total) by mouth daily., Disp: 90 tablet, Rfl: 0   ferrous sulfate 325 (65 FE) MG tablet, Take 325 mg by mouth every evening. , Disp: , Rfl:    fluticasone (FLONASE) 50 MCG/ACT nasal spray, Place 2 sprays into both nostrils daily as needed for allergies., Disp: 48 g, Rfl: 0   Multiple Vitamin (MULTIVITAMIN WITH MINERALS) TABS tablet, Take 1 tablet by mouth every evening., Disp: , Rfl:    TRAVATAN Z 0.004 % SOLN ophthalmic solution, Place 1 drop into both eyes at bedtime., Disp: , Rfl: 3   Armodafinil (NUVIGIL) 150 MG tablet, Take 1 tablet (150 mg total) by mouth daily as needed (shift work--sleepiness)., Disp: 90 tablet, Rfl: 0   buPROPion (WELLBUTRIN XL) 300 MG 24 hr tablet, Take 1 tablet (300 mg total) by mouth daily., Disp: 90 tablet, Rfl: 1   metaxalone (SKELAXIN) 800 MG tablet, Take 1 tablet (800 mg total) by mouth 3 (three) times daily as needed for muscle spasms., Disp: 270 tablet, Rfl: 0   nebivolol (BYSTOLIC) 10 MG tablet, Take 1 tablet (10 mg total) by mouth daily., Disp: 90 tablet, Rfl: 1   olmesartan (BENICAR) 40 MG tablet, Take 1 tablet (40 mg total) by mouth daily., Disp: 90 tablet,  Rfl: 1  Allergies  Allergen Reactions   Lisinopril Cough        Norvasc [Amlodipine Besylate]    Hctz [Hydrochlorothiazide] Other (See Comments)    Burning sensation on her feet     I personally reviewed active problem list, medication list, allergies, family history with the patient/caregiver today.   ROS  Ten systems reviewed and is negative except as mentioned in HPI    Objective  Vitals:   03/22/23 0927 03/22/23 0958  BP: (!) 150/84 (!) 150/84  Pulse: 84   Resp: 16   SpO2: 98%   Weight: 231 lb 1.6 oz (104.8 kg)   Height: 5\' 2"  (1.575 m)     Body mass index is 42.27 kg/m.  Physical Exam  Constitutional: Patient appears well-developed and well-nourished. Obese  No distress.  HEENT: head atraumatic, normocephalic, pupils equal and reactive to light, strabismus neck supple Cardiovascular: Normal rate, regular rhythm 2/6  SEM heart sounds.  No murmur heard. No BLE edema. Pulmonary/Chest: Effort normal and breath sounds normal. No respiratory distress. Abdominal: Soft.  There is no tenderness. Psychiatric: Patient has a normal mood and affect. behavior is normal. Judgment and thought content normal.   Diabetic Foot Exam:     PHQ2/9:    03/22/2023    9:21 AM 12/21/2022    9:17 AM 09/20/2022    8:24 AM 06/12/2022   11:58 AM 03/13/2022   10:14 AM  Depression screen PHQ 2/9  Decreased Interest 3 0 0 0 2  Down, Depressed, Hopeless 3 0 0 0 2  PHQ - 2 Score 6 0 0 0 4  Altered sleeping 3 0 0 0 2  Tired, decreased energy 3 0 0 0 2  Change in appetite 3 0 1 0 0  Feeling bad or failure about yourself  0 0 0 0 2  Trouble concentrating 3 0 0 0 2  Moving slowly or fidgety/restless 0 0 0 0 0  Suicidal thoughts 0 0 0 0 0  PHQ-9 Score 18 0 1  0 12  Difficult doing work/chores Very difficult  Not difficult at all  Somewhat difficult    phq 9 is positive  Fall Risk:    03/22/2023    9:21 AM 12/21/2022    9:17 AM 09/20/2022    8:09 AM 06/12/2022   11:58 AM 03/13/2022    10:13 AM  Fall Risk   Falls in the past year? 0 1 0 1 0  Number falls in past yr: 0 0  0   Injury with Fall? 0 0  0   Risk for fall due to : No Fall Risks History of fall(s) No Fall Risks History of fall(s) No Fall Risks  Follow up Falls prevention discussed;Education provided;Falls evaluation completed Falls prevention discussed;Education provided;Falls evaluation completed Falls prevention discussed Falls prevention discussed;Education provided;Falls evaluation completed Falls evaluation completed;Education provided;Falls prevention discussed     Assessment & Plan  1. Major depression, recurrent, chronic (HCC) (Primary)  - buPROPion (WELLBUTRIN XL) 300 MG 24 hr tablet; Take 1 tablet (300 mg total) by mouth daily.  Dispense: 90 tablet; Refill: 1  2. Seasonal affective disorder (HCC)  Start lexapro   3. Morbid obesity (HCC)  Discussed with the patient the risk posed by an increased BMI. Discussed importance of portion control, calorie counting and at least 150 minutes of physical activity weekly. Avoid sweet beverages and drink more water. Eat at least 6 servings of fruit and vegetables daily    4. Shift work sleep disorder  - Armodafinil (NUVIGIL) 150 MG tablet; Take 1 tablet (150 mg total) by mouth daily as needed (shift work--sleepiness).  Dispense: 90 tablet; Refill: 0  5. Obstructive apnea  - Armodafinil (NUVIGIL) 150 MG tablet; Take 1 tablet (150 mg total) by mouth daily as needed (shift work--sleepiness).  Dispense: 90 tablet; Refill: 0  6. Fibromyalgia  - metaxalone (SKELAXIN) 800 MG tablet; Take 1 tablet (800 mg total) by mouth 3 (three) times daily as needed for muscle spasms.  Dispense: 270 tablet; Refill: 0  7. Essential (primary) hypertension  - nebivolol (BYSTOLIC) 10 MG tablet; Take 1 tablet (10 mg total) by mouth daily.  Dispense: 90 tablet; Refill: 1 - olmesartan (BENICAR) 40 MG tablet; Take 1 tablet (40 mg total) by mouth daily.  Dispense: 90 tablet; Refill:  1  8. Dysmetabolic syndrome   9. Overflow incontinence  - Ambulatory referral to Urology

## 2023-03-26 ENCOUNTER — Ambulatory Visit: Payer: 59 | Admitting: Family Medicine

## 2023-03-30 ENCOUNTER — Ambulatory Visit: Payer: 59 | Admitting: Family Medicine

## 2023-04-04 ENCOUNTER — Encounter: Payer: Self-pay | Admitting: Family Medicine

## 2023-04-05 ENCOUNTER — Ambulatory Visit: Payer: 59

## 2023-05-21 ENCOUNTER — Ambulatory Visit: Payer: 59 | Admitting: Urology

## 2023-05-21 ENCOUNTER — Encounter: Payer: Self-pay | Admitting: Urology

## 2023-05-29 ENCOUNTER — Encounter: Payer: Self-pay | Admitting: Family Medicine

## 2023-05-29 ENCOUNTER — Other Ambulatory Visit: Payer: Self-pay | Admitting: Family Medicine

## 2023-05-29 MED ORDER — DULOXETINE HCL 60 MG PO CPEP: 60.0000 mg | ORAL_CAPSULE | Freq: Every day | ORAL | 0 refills | Status: DC

## 2023-06-14 ENCOUNTER — Encounter: Payer: Self-pay | Admitting: Family Medicine

## 2023-06-15 ENCOUNTER — Other Ambulatory Visit: Payer: Self-pay | Admitting: Family Medicine

## 2023-06-15 MED ORDER — BACLOFEN 10 MG PO TABS: 10.0000 mg | ORAL_TABLET | Freq: Three times a day (TID) | ORAL | 0 refills | Status: DC

## 2023-06-21 ENCOUNTER — Ambulatory Visit: Payer: Self-pay | Admitting: Family Medicine

## 2023-07-06 ENCOUNTER — Other Ambulatory Visit: Payer: Self-pay

## 2023-07-06 ENCOUNTER — Other Ambulatory Visit (HOSPITAL_COMMUNITY): Payer: Self-pay

## 2023-07-06 MED ORDER — METAXALONE 800 MG PO TABS: 800.0000 mg | ORAL_TABLET | Freq: Three times a day (TID) | ORAL | 0 refills | Status: DC | PRN

## 2023-07-06 MED ORDER — TRAVOPROST (BAK FREE) 0.004 % OP SOLN: 1.0000 [drp] | Freq: Every evening | OPHTHALMIC | 6 refills | Status: DC

## 2023-07-06 MED ORDER — DULOXETINE HCL 60 MG PO CPEP: 60.0000 mg | ORAL_CAPSULE | Freq: Every evening | ORAL | 1 refills | Status: DC

## 2023-07-06 MED ORDER — ESCITALOPRAM OXALATE 10 MG PO TABS: 10.0000 mg | ORAL_TABLET | Freq: Every day | ORAL | 0 refills | Status: DC

## 2023-07-06 MED ORDER — BUPROPION HCL ER (XL) 300 MG PO TB24: 300.0000 mg | ORAL_TABLET | Freq: Every day | ORAL | 0 refills | Status: DC

## 2023-07-06 MED ORDER — ZEPBOUND 2.5 MG/0.5ML ~~LOC~~ SOAJ: 2.5000 mg | SUBCUTANEOUS | 0 refills | Status: DC

## 2023-07-06 MED ORDER — TRAVOPROST (BAK FREE) 0.004 % OP SOLN
1.0000 [drp] | Freq: Every day | OPHTHALMIC | 2 refills | Status: AC
  Filled 2023-07-23: qty 2.5, 50d supply, fill #0

## 2023-07-06 MED ORDER — OLMESARTAN MEDOXOMIL 40 MG PO TABS
40.0000 mg | ORAL_TABLET | Freq: Every day | ORAL | 1 refills | Status: DC
  Filled 2023-07-06: qty 90, 90d supply, fill #0

## 2023-07-07 ENCOUNTER — Other Ambulatory Visit: Payer: Self-pay | Admitting: Family Medicine

## 2023-07-07 DIAGNOSIS — I10 Essential (primary) hypertension: Secondary | ICD-10-CM

## 2023-07-12 ENCOUNTER — Other Ambulatory Visit: Payer: Self-pay

## 2023-07-12 ENCOUNTER — Other Ambulatory Visit (HOSPITAL_BASED_OUTPATIENT_CLINIC_OR_DEPARTMENT_OTHER): Payer: Self-pay

## 2023-07-13 ENCOUNTER — Encounter: Payer: Self-pay | Admitting: Family Medicine

## 2023-07-13 ENCOUNTER — Other Ambulatory Visit: Payer: Self-pay

## 2023-07-13 ENCOUNTER — Ambulatory Visit: Payer: PRIVATE HEALTH INSURANCE | Admitting: Family Medicine

## 2023-07-13 DIAGNOSIS — M797 Fibromyalgia: Secondary | ICD-10-CM

## 2023-07-13 DIAGNOSIS — F325 Major depressive disorder, single episode, in full remission: Secondary | ICD-10-CM

## 2023-07-13 DIAGNOSIS — K760 Fatty (change of) liver, not elsewhere classified: Secondary | ICD-10-CM

## 2023-07-13 DIAGNOSIS — E8881 Metabolic syndrome: Secondary | ICD-10-CM

## 2023-07-13 DIAGNOSIS — I10 Essential (primary) hypertension: Secondary | ICD-10-CM

## 2023-07-13 DIAGNOSIS — G4733 Obstructive sleep apnea (adult) (pediatric): Secondary | ICD-10-CM

## 2023-07-13 DIAGNOSIS — F338 Other recurrent depressive disorders: Secondary | ICD-10-CM

## 2023-07-13 DIAGNOSIS — G4726 Circadian rhythm sleep disorder, shift work type: Secondary | ICD-10-CM | POA: Diagnosis not present

## 2023-07-13 DIAGNOSIS — R7303 Prediabetes: Secondary | ICD-10-CM

## 2023-07-13 DIAGNOSIS — M62830 Muscle spasm of back: Secondary | ICD-10-CM

## 2023-07-13 MED ORDER — OLMESARTAN MEDOXOMIL 40 MG PO TABS
40.0000 mg | ORAL_TABLET | Freq: Every day | ORAL | 1 refills | Status: DC
  Filled 2023-07-13: qty 90, 90d supply, fill #0
  Filled 2023-12-20: qty 90, 90d supply, fill #1

## 2023-07-13 MED ORDER — BACLOFEN 10 MG PO TABS
10.0000 mg | ORAL_TABLET | Freq: Three times a day (TID) | ORAL | 0 refills | Status: DC
  Filled 2023-07-13: qty 90, 30d supply, fill #0

## 2023-07-13 MED ORDER — ARMODAFINIL 150 MG PO TABS
150.0000 mg | ORAL_TABLET | Freq: Every day | ORAL | 0 refills | Status: DC | PRN
Start: 2023-07-13 — End: 2023-10-19
  Filled 2023-07-13: qty 90, 90d supply, fill #0

## 2023-07-13 MED ORDER — BUPROPION HCL ER (XL) 300 MG PO TB24
300.0000 mg | ORAL_TABLET | Freq: Every day | ORAL | 1 refills | Status: DC
  Filled 2023-07-13: qty 90, 90d supply, fill #0
  Filled 2023-11-22: qty 90, 90d supply, fill #1

## 2023-07-13 MED ORDER — DULOXETINE HCL 60 MG PO CPEP
60.0000 mg | ORAL_CAPSULE | Freq: Every day | ORAL | 1 refills | Status: AC
  Filled 2023-07-13: qty 90, 90d supply, fill #0
  Filled 2023-12-20: qty 90, 90d supply, fill #1

## 2023-07-13 MED ORDER — NEBIVOLOL HCL 20 MG PO TABS
20.0000 mg | ORAL_TABLET | Freq: Every day | ORAL | 1 refills | Status: DC
  Filled 2023-07-13: qty 90, 90d supply, fill #0
  Filled 2023-12-20: qty 90, 90d supply, fill #1

## 2023-07-13 MED ORDER — METFORMIN HCL ER 500 MG PO TB24
500.0000 mg | ORAL_TABLET | Freq: Every day | ORAL | 0 refills | Status: DC
  Filled 2023-07-13: qty 90, 90d supply, fill #0

## 2023-07-13 NOTE — Progress Notes (Addendum)
 Name: Kathleen Conley   MRN: [id number]    DOB: 05/27/1970   Date:07/13/2023       Progress Note  Subjective  Chief Complaint  Chief Complaint  Patient presents with   Medical Management of Chronic Issues   HPI   Major depression Recurrent and Seasonal affective disorder:back on Duloxetine  since January also taking Wellbutrin  and doing well, likes new job at [employer].    Overflow incontinence: going on for years but getting worse, happens when going from sitting to standing but lately also when walking. No dysuria or hematuria.    Metabolic Syndrome and Morbid Obesity/fatty liver : we tried giving her Trulicity  but needs step therapy, she did not like  Metformin  - caused diarrhea but she would like to try it again . She denies polyphagia, polyuria or polydipsia. She needs to eat smaller portions and engage in physical activity    Morbid Obesity: she has a long history of obesity, started after her divorce when she was in her 35's. She tried Weight Watchers in the past , discussed resuming it again    FMS: could not tolerate Lyrica  ( caused nightmares and vivid dreams) also stopped Gabapentin  ( she was grinding her teeth), she is taking baclofen  prn . She is doing better since she resumed Duloxetine     Back pain : chronic controlled  taking tylenol  prn, and baclofen  , no radiculitis lately .stable    OSA: she has mild symptoms, did not qualify for CPAP , avoiding sleeping on her back,  trying to sleep  on her side. 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    HTN: she is currently on Bystolic  10  mg and Benicar   40 mg BP today is elevated. She denies chest pain,  palpitation or dizziness. We will adjust bystolic  to 20 mg today    Dyslipidemia:    The 10-year ASCVD risk score (Arnett DK, et al., 2019) is: 5.6%   Values used to calculate the score:     Age: 53 years     Sex: Female     Is Non-Hispanic African American: Yes     Diabetic: No     Tobacco smoker: No      Systolic Blood Pressure: 134 mmHg     Is BP treated: Yes     HDL Cholesterol: 50 mg/dL     Total Cholesterol: 186 mg/dL       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   Flat foot 01/28/2016   Aortic sclerosis 10/14/2015   Midline low back pain without sciatica 09/20/2015   Leukocytosis 11/29/2014   Osteoarthritis of left knee 11/20/2014   History of ventral hernia repair 10/27/2014   Allergic rhinitis 08/17/2014   Axillary hidradenitis suppurativa 08/17/2014   Baker cyst 08/17/2014   Chronic constipation 08/17/2014   Major depression in remission (HCC) 08/17/2014   Engages in travel abroad 08/17/2014   Fibromyalgia 08/17/2014   Cardiac murmur 08/17/2014   Dysmetabolic syndrome 08/17/2014   Plantar fasciitis 08/17/2014   Beat, premature ventricular 08/17/2014   Abnormal neurological finding suggestive of lumbar-level spinal disorder 08/17/2014   Obstructive apnea 12/02/2013   Essential (primary) hypertension 12/09/2009  Hypertrichosis 12/09/2009    Past Surgical History:  Procedure Laterality Date   ACHILLES TENDON SURGERY Left 08/15/2019   Procedure: TRIPLE ARTHRODESIS AND ACHILLES TENDON LENGTHENING LEFT FOOT;  Surgeon: Anell Baptist, DPM;  Location: [facility];  Service: Podiatry;  Laterality: Left;   COLONOSCOPY WITH PROPOFOL  N/A 07/23/2020   Procedure: COLONOSCOPY WITH PROPOFOL ;  Surgeon: Irby Mannan, MD;  Location: [facility];  Service: Endoscopy;  Laterality: N/A;   FOOT ARTHRODESIS Right 12/01/2016   Procedure: ARTHRODESIS FOOT; TRIPLE;  Surgeon: Anell Baptist, DPM;  Location: [facility];  Service: Podiatry;  Laterality: Right;   HERNIA REPAIR  10/09/11   Ventral  Incarcerated- Dr. Amalia Badder   OOPHORECTOMY Left 12/09/2009   TUBAL LIGATION  1993   VENTRAL HERNIA REPAIR  10/09/2011    Family History  Problem Relation Age of Onset   Hypertension Mother    CVA Mother    Kidney disease Mother    Congenital heart disease Mother    Heart disease Father    Hypertension Father    Diabetes Father    Breast cancer Paternal Grandmother        Bilateral   Colon cancer Maternal Grandfather    Colon cancer Maternal Uncle     Social History   Tobacco Use   Smoking status: Never   Smokeless tobacco: Never  Substance Use Topics   Alcohol use: No    Alcohol/week: 0.0 standard drinks of alcohol     Current Outpatient Medications:    acetaminophen  (TYLENOL ) 500 MG tablet, Take 1 tablet (500 mg total) by mouth every 6 (six) hours as needed. Max of 3 grams daily or 6 pills dialy (Patient taking differently: Take 1,000 mg by mouth every 6 (six) hours as needed for moderate pain (pain score 4-6) or headache.), Disp: 30 tablet, Rfl: 0   albuterol  (VENTOLIN  HFA) 108 (90 Base) MCG/ACT inhaler, Inhale 2 puffs into the lungs every 6 (six) hours as needed for wheezing or shortness of breath., Disp: 8 g, Rfl: 0   cetirizine (ZYRTEC) 10 MG tablet, Take 10 mg by mouth every evening., Disp: , Rfl:    Cholecalciferol  (VITAMIN D ) 2000 UNITS tablet, Take 2,000 Units by mouth every evening. , Disp: , Rfl:    docusate sodium  (COLACE) 100 MG capsule, Take 100 mg by mouth daily as needed for moderate constipation. , Disp: , Rfl:    ferrous sulfate  325 (65 FE) MG tablet, Take 325 mg by mouth every evening. , Disp: , Rfl:    fluticasone  (FLONASE ) 50 MCG/ACT nasal spray, Place 2 sprays into both nostrils daily as needed for allergies., Disp: 48 g, Rfl: 0   metFORMIN  (GLUCOPHAGE -XR) 500 MG 24 hr tablet, Take 1 tablet (500 mg total) by mouth daily with breakfast., Disp: 90 tablet, Rfl: 0   Multiple Vitamin (MULTIVITAMIN WITH MINERALS) TABS tablet, Take 1 tablet by mouth every evening.,  Disp: , Rfl:    Travoprost , BAK Free, (TRAVATAN ) 0.004 % SOLN ophthalmic solution, Place 1 drop into both eyes at bedtime., Disp: 7.5 mL, Rfl: 2   Armodafinil  (NUVIGIL ) 150 MG tablet, Take 1 tablet (150 mg total) by mouth daily as needed (shift work--sleepiness)., Disp: 90 tablet, Rfl: 0   baclofen  (LIORESAL ) 10 MG tablet, Take 1 tablet (10 mg total) by mouth 3 (three) times daily., Disp: 90 each, Rfl: 0   buPROPion  (WELLBUTRIN  XL) 300 MG 24 hr tablet, Take 1 tablet (300 mg total) by mouth daily., Disp: 90 tablet, Rfl: 1   DULoxetine  (CYMBALTA ) 60  MG capsule, Take 1 capsule (60 mg total) by mouth daily., Disp: 90 capsule, Rfl: 1   nebivolol  20 MG TABS, Take 1 tablet (20 mg total) by mouth daily., Disp: 90 tablet, Rfl: 1   olmesartan  (BENICAR ) 40 MG tablet, Take 1 tablet (40 mg total) by mouth daily., Disp: 90 tablet, Rfl: 1  Allergies  Allergen Reactions   Lisinopril Cough        Norvasc  [Amlodipine  Besylate]    Hctz [Hydrochlorothiazide] Other (See Comments)    Burning sensation on her feet     I personally reviewed active problem list, medication list, allergies, family history with the patient/caregiver today.   ROS  Ten systems reviewed and is negative except as mentioned in HPI    Objective Physical Exam  Constitutional: Patient appears well-developed and well-nourished. Obese  No distress.  HEENT: head atraumatic, normocephalic, pupils equal and reactive to light, neck supple Cardiovascular: Normal rate, regular rhythm and normal heart sounds.  No murmur heard. No BLE edema. Pulmonary/Chest: Effort normal and breath sounds normal. No respiratory distress. Abdominal: Soft.  There is no tenderness. Psychiatric: Patient has a normal mood and affect. behavior is normal. Judgment and thought content normal.   Vitals:   07/13/23 1057  BP: 134/82  Pulse: 70  Resp: 16  SpO2: 97%  Weight: 233 lb 9.6 oz (106 kg)  Height: 5\' 2"  (1.575 m)    Body mass index is 42.73  kg/m.   PHQ2/9:    07/13/2023   10:56 AM 03/22/2023    9:21 AM 12/21/2022    9:17 AM 09/20/2022    8:24 AM 06/12/2022   11:58 AM  Depression screen PHQ 2/9  Decreased Interest 0 3 0 0 0  Down, Depressed, Hopeless 0 3 0 0 0  PHQ - 2 Score 0 6 0 0 0  Altered sleeping 0 3 0 0 0  Tired, decreased energy 0 3 0 0 0  Change in appetite 0 3 0 1 0  Feeling bad or failure about yourself  0 0 0 0 0  Trouble concentrating 0 3 0 0 0  Moving slowly or fidgety/restless 0 0 0 0 0  Suicidal thoughts 0 0 0 0 0  PHQ-9 Score 0 18 0 1 0  Difficult doing work/chores Not difficult at all Very difficult  Not difficult at all     phq 9 is negative  Fall Risk:    07/13/2023   10:46 AM 03/22/2023    9:21 AM 12/21/2022    9:17 AM 09/20/2022    8:09 AM 06/12/2022   11:58 AM  Fall Risk   Falls in the past year? 1 0 1 0 1  Number falls in past yr: 0 0 0  0  Injury with Fall? 0 0 0  0  Risk for fall due to : Impaired balance/gait No Fall Risks History of fall(s) No Fall Risks History of fall(s)  Follow up Falls prevention discussed;Education provided;Falls evaluation completed Falls prevention discussed;Education provided;Falls evaluation completed Falls prevention discussed;Education provided;Falls evaluation completed Falls prevention discussed Falls prevention discussed;Education provided;Falls evaluation completed      Assessment & Plan Shift work sleep disorder Managed with armodafinil  for alertness during night shifts. - Prescribe armodafinil  to be taken on workdays.  Obstructive sleep apnea Remains mild, does not qualify for CPAP therapy, contributes to fatigue.  Hypertension Not optimally controlled. Home readings elevated at 144/84 mmHg. No side effects from Bystolic . Increasing dosage to improve control. - Increase Bystolic  to 20 mg  daily. - Continue olmesartan  40 mg daily. - Schedule follow-up in 3 months to monitor blood pressure.  Morbid obesity BMI 42.73. Discussed weight loss  strategies including lifestyle changes, Weight Watchers, and potential medications. Emphasized lifestyle changes before considering surgery. - Encourage participation in [employer]. - Consider weight loss medications like Contrave or Qsymia if blood pressure is well-controlled. - Discuss weight loss surgery options and importance of lifestyle changes prior to surgery.  Prediabetes Managed with lifestyle modifications and consideration of metformin . Previous gastrointestinal side effects, willing to retry for potential benefits in weight management and fatty liver improvement. - Prescribe metformin . - Discuss potential gastrointestinal side effects, including diarrhea.  Fatty liver May benefit from metformin  use, aiding in weight management.  Fibromyalgia Symptoms managed with duloxetine , preferred over Lexapro . Baclofen  used for muscle spasms on workdays. - Continue duloxetine . - Prescribe baclofen  for muscle spasms as needed.  Chronic back pain Managed with topical treatments like Biofreeze and muscle relaxants. Baclofen  used for muscle spasms on workdays. - Continue use of Biofreeze. - Prescribe baclofen  for muscle spasms as needed.  Depression in remission Currently in remission with Wellbutrin  and duloxetine . Lexapro  discontinued due to adverse effects. - Continue Wellbutrin  300 mg daily. - Continue duloxetine .  Seasonal affective disorder Symptoms well-controlled with current regimen. - Continue current medication regimen.

## 2023-07-17 ENCOUNTER — Other Ambulatory Visit: Payer: Self-pay

## 2023-07-23 ENCOUNTER — Other Ambulatory Visit: Payer: Self-pay

## 2023-09-05 ENCOUNTER — Other Ambulatory Visit: Payer: Self-pay

## 2023-09-05 MED ORDER — TRAVOPROST (BAK FREE) 0.004 % OP SOLN
1.0000 [drp] | Freq: Every evening | OPHTHALMIC | 0 refills | Status: AC
  Filled 2023-09-05: qty 7.5, 90d supply, fill #0

## 2023-09-06 ENCOUNTER — Other Ambulatory Visit: Payer: Self-pay

## 2023-10-19 ENCOUNTER — Other Ambulatory Visit: Payer: Self-pay

## 2023-10-19 ENCOUNTER — Ambulatory Visit: Admitting: Family Medicine

## 2023-10-19 ENCOUNTER — Encounter: Payer: Self-pay | Admitting: Family Medicine

## 2023-10-19 VITALS — BP 132/74 | HR 85 | Resp 18 | Ht 62.0 in | Wt 228.3 lb

## 2023-10-19 DIAGNOSIS — Z1159 Encounter for screening for other viral diseases: Secondary | ICD-10-CM

## 2023-10-19 DIAGNOSIS — H401132 Primary open-angle glaucoma, bilateral, moderate stage: Secondary | ICD-10-CM | POA: Diagnosis not present

## 2023-10-19 DIAGNOSIS — G4726 Circadian rhythm sleep disorder, shift work type: Secondary | ICD-10-CM | POA: Diagnosis not present

## 2023-10-19 DIAGNOSIS — M797 Fibromyalgia: Secondary | ICD-10-CM | POA: Diagnosis not present

## 2023-10-19 DIAGNOSIS — E785 Hyperlipidemia, unspecified: Secondary | ICD-10-CM

## 2023-10-19 DIAGNOSIS — H35033 Hypertensive retinopathy, bilateral: Secondary | ICD-10-CM | POA: Diagnosis not present

## 2023-10-19 DIAGNOSIS — G4733 Obstructive sleep apnea (adult) (pediatric): Secondary | ICD-10-CM | POA: Diagnosis not present

## 2023-10-19 DIAGNOSIS — R7303 Prediabetes: Secondary | ICD-10-CM

## 2023-10-19 DIAGNOSIS — F338 Other recurrent depressive disorders: Secondary | ICD-10-CM

## 2023-10-19 DIAGNOSIS — Z8639 Personal history of other endocrine, nutritional and metabolic disease: Secondary | ICD-10-CM | POA: Diagnosis not present

## 2023-10-19 DIAGNOSIS — I10 Essential (primary) hypertension: Secondary | ICD-10-CM | POA: Diagnosis not present

## 2023-10-19 DIAGNOSIS — Z23 Encounter for immunization: Secondary | ICD-10-CM

## 2023-10-19 DIAGNOSIS — Z1231 Encounter for screening mammogram for malignant neoplasm of breast: Secondary | ICD-10-CM

## 2023-10-19 DIAGNOSIS — L0291 Cutaneous abscess, unspecified: Secondary | ICD-10-CM

## 2023-10-19 DIAGNOSIS — H43812 Vitreous degeneration, left eye: Secondary | ICD-10-CM | POA: Diagnosis not present

## 2023-10-19 MED ORDER — DULOXETINE HCL 30 MG PO CPEP
30.0000 mg | ORAL_CAPSULE | Freq: Every day | ORAL | 0 refills | Status: AC
  Filled 2023-10-19 – 2023-12-20 (×2): qty 90, 90d supply, fill #0

## 2023-10-19 MED ORDER — ARMODAFINIL 150 MG PO TABS
150.0000 mg | ORAL_TABLET | Freq: Every day | ORAL | 0 refills | Status: DC | PRN
  Filled 2023-10-19: qty 90, 90d supply, fill #0

## 2023-10-19 MED ORDER — BACLOFEN 10 MG PO TABS
10.0000 mg | ORAL_TABLET | Freq: Three times a day (TID) | ORAL | 0 refills | Status: DC
  Filled 2023-10-19 – 2023-12-20 (×2): qty 90, 30d supply, fill #0

## 2023-10-19 MED ORDER — AMOXICILLIN-POT CLAVULANATE 875-125 MG PO TABS
1.0000 | ORAL_TABLET | Freq: Two times a day (BID) | ORAL | 0 refills | Status: DC
  Filled 2023-10-19: qty 14, 7d supply, fill #0

## 2023-10-19 MED ORDER — METFORMIN HCL ER 500 MG PO TB24
500.0000 mg | ORAL_TABLET | Freq: Every day | ORAL | 0 refills | Status: DC
  Filled 2023-10-19 – 2023-12-20 (×2): qty 90, 90d supply, fill #0

## 2023-10-19 NOTE — Progress Notes (Signed)
 Name: Kathleen Conley   MRN: [id number]    DOB: [birth year]   Date:10/19/2023       Progress Note  Subjective  Chief Complaint  Chief Complaint  Patient presents with   Medical Management of Chronic Issues   Mass    Groin area on left side for about a week, tender   Discussed the use of AI scribe software for clinical note transcription with the patient, who gave verbal consent to proceed.  History of Present Illness Kathleen Conley is a 53 year old female with recurrent major depression and seasonal affective disorder who presents for a regular three-month follow-up.  She experiences recurrent major depression and seasonal affective disorder, particularly during the winter months. She takes duloxetine  60 mg daily, primarily for fibromyalgia, which also helps with her depression. Additionally, she takes 300 mg of Wellbutrin  daily, which she finds beneficial for her energy levels. A previous attempt to discontinue duloxetine  resulted in worsening depression, necessitating resumption of the medication.  She manages obstructive sleep apnea by sleeping on her side, as she does not use a CPAP machine. She works night shifts from 7 PM to 7 AM and uses armodafinil  to maintain alertness during her shifts.  She has a history of prediabetes, with her last A1c recorded at 6.1. She is on metformin , which she tolerates well without side effects. Since starting metformin , her weight has decreased from 233 lbs to 228 lbs. 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 She occasionally experiences increased appetite but has not reported any other symptoms of diabetes.  She has a history of iron deficiency anemia but denies any pica or unusual cravings. Her colonoscopy is up to date.  For dyslipidemia, she manages her condition through diet alone. She is on two medications for hypertension: comastatin 40 mg and Bivolol 20 mg. She reports that  her blood pressure readings at home are usually around 130-something. No chest pain or palpitations.  She takes baclofen  for muscle spasms associated with fibromyalgia and will need a refill soon.  She reports a tender, swollen area in her groin, similar to hidradenitis lesions she experiences under her arms. She describes it as feeling like it might 'pop anytime' and notes it is getting bigger.    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   Flat foot 01/28/2016   Aortic sclerosis 10/14/2015   Midline low back pain without sciatica 09/20/2015   Leukocytosis 11/29/2014   Osteoarthritis of left knee 11/20/2014   History of ventral hernia repair 10/27/2014   Allergic rhinitis 08/17/2014   Axillary hidradenitis suppurativa 08/17/2014   Baker cyst 08/17/2014   Chronic constipation 08/17/2014   Major depression in remission (HCC) 08/17/2014   Engages in travel abroad 08/17/2014   Fibromyalgia 08/17/2014   Cardiac murmur 08/17/2014   Dysmetabolic syndrome 08/17/2014   Plantar fasciitis 08/17/2014   Beat, premature ventricular 08/17/2014   Abnormal neurological finding suggestive of lumbar-level spinal disorder 08/17/2014   Obstructive apnea 12/02/2013   Essential (primary) hypertension 12/09/2009   Hypertrichosis 12/09/2009    Past Surgical History:  Procedure Laterality Date   ACHILLES TENDON SURGERY Left 08/15/2019  Procedure: TRIPLE ARTHRODESIS AND ACHILLES TENDON LENGTHENING LEFT FOOT;  Surgeon: Ashley Soulier, DPM;  Location: [facility];  Service: Podiatry;  Laterality: Left;   COLONOSCOPY WITH PROPOFOL  N/A 07/23/2020   Procedure: COLONOSCOPY WITH PROPOFOL ;  Surgeon: Janalyn Keene NOVAK,  MD;  Location: [facility];  Service: Endoscopy;  Laterality: N/A;   FOOT ARTHRODESIS Right 12/01/2016   Procedure: ARTHRODESIS FOOT; TRIPLE;  Surgeon: Ashley Soulier, DPM;  Location: [facility];  Service: Podiatry;  Laterality: Right;   HERNIA REPAIR  10/09/11   Ventral Incarcerated- Dr. Lorrene   OOPHORECTOMY Left 12/09/2009   TUBAL LIGATION  1993   VENTRAL HERNIA REPAIR  10/09/2011    Family History  Problem Relation Age of Onset   Hypertension Mother    CVA Mother    Kidney disease Mother    Congenital heart disease Mother    Heart disease Father    Hypertension Father    Diabetes Father    Breast cancer Paternal Grandmother        Bilateral   Colon cancer Maternal Grandfather    Colon cancer Maternal Uncle     Social History   Tobacco Use   Smoking status: Never   Smokeless tobacco: Never  Substance Use Topics   Alcohol use: No    Alcohol/week: 0.0 standard drinks of alcohol     Current Outpatient Medications:    acetaminophen  (TYLENOL ) 500 MG tablet, Take 1 tablet (500 mg total) by mouth every 6 (six) hours as needed. Max of 3 grams daily or 6 pills dialy (Patient taking differently: Take 1,000 mg by mouth every 6 (six) hours as needed for moderate pain (pain score 4-6) or headache.), Disp: 30 tablet, Rfl: 0   albuterol  (VENTOLIN  HFA) 108 (90 Base) MCG/ACT inhaler, Inhale 2 puffs into the lungs every 6 (six) hours as needed for wheezing or shortness of breath., Disp: 8 g, Rfl: 0   Armodafinil  (NUVIGIL ) 150 MG tablet, Take 1 tablet (150 mg total) by mouth daily as needed (shift work--sleepiness)., Disp: 90 tablet, Rfl: 0   baclofen  (LIORESAL ) 10 MG tablet, Take 1 tablet (10 mg total) by mouth 3 (three) times daily., Disp: 90 each, Rfl: 0   buPROPion  (WELLBUTRIN  XL) 300 MG 24 hr tablet, Take 1 tablet (300 mg total) by mouth daily., Disp: 90 tablet, Rfl: 1   cetirizine (ZYRTEC) 10 MG tablet, Take 10 mg by mouth every evening., Disp: , Rfl:    Cholecalciferol  (VITAMIN D ) 2000  UNITS tablet, Take 2,000 Units by mouth every evening. , Disp: , Rfl:    docusate sodium  (COLACE) 100 MG capsule, Take 100 mg by mouth daily as needed for moderate constipation. , Disp: , Rfl:    DULoxetine  (CYMBALTA ) 60 MG capsule, Take 1 capsule (60 mg total) by mouth daily., Disp: 90 capsule, Rfl: 1   ferrous sulfate  325 (65 FE) MG tablet, Take 325 mg by mouth every evening. , Disp: , Rfl:    fluticasone  (FLONASE ) 50 MCG/ACT nasal spray, Place 2 sprays into both nostrils daily as needed for allergies., Disp: 48 g, Rfl: 0   metFORMIN  (GLUCOPHAGE -XR) 500 MG 24 hr tablet, Take 1 tablet (500 mg total) by mouth daily with breakfast., Disp: 90 tablet, Rfl: 0   Multiple Vitamin (MULTIVITAMIN WITH MINERALS) TABS tablet, Take 1 tablet by mouth every evening., Disp: , Rfl:    Nebivolol  HCl 20 MG TABS, Take 1 tablet (20 mg total) by mouth daily., Disp: 90 tablet, Rfl: 1   olmesartan  (BENICAR ) 40 MG tablet, Take  1 tablet (40 mg total) by mouth daily., Disp: 90 tablet, Rfl: 1   Travoprost , BAK Free, (TRAVATAN ) 0.004 % SOLN ophthalmic solution, Place 1 drop into both eyes at bedtime., Disp: 7.5 mL, Rfl: 2   Travoprost , BAK Free, (TRAVATAN ) 0.004 % SOLN ophthalmic solution, Place 1 drop into both eyes at bedtime., Disp: 7.5 mL, Rfl: 0  Allergies  Allergen Reactions   Lisinopril Cough        Norvasc  [Amlodipine  Besylate]    Hctz [Hydrochlorothiazide] Other (See Comments)    Burning sensation on her feet     I personally reviewed active problem list, medication list, allergies, family history with the patient/caregiver today.   ROS  Ten systems reviewed and is negative except as mentioned in HPI    Objective Physical Exam  CONSTITUTIONAL: Patient appears well-developed and well-nourished. No distress. HEENT: Head atraumatic, normocephalic, neck supple. CARDIOVASCULAR: Systolic murmur 2/6. Normal rate, regular rhythm and normal heart sounds. No BLE edema. PULMONARY: Effort normal and breath  sounds normal. No respiratory distress. MUSCULOSKELETAL: Normal gait. Without gross motor or sensory deficit. PSYCHIATRIC: Patient has a normal mood and affect. Behavior is normal. Judgment and thought content normal. GENITOURINARY: Tender, swollen area in left groin, subcutaneous, skin intact. No redness or increase in warmth  Vitals:   10/19/23 1354  BP: 132/74  Pulse: 85  Resp: 18  SpO2: 97%  Weight: 228 lb 4.8 oz (103.6 kg)  Height: 5' 2 (1.575 m)    Body mass index is 41.76 kg/m.    PHQ2/9:    10/19/2023    1:49 PM 07/13/2023   10:56 AM 03/22/2023    9:21 AM 12/21/2022    9:17 AM 09/20/2022    8:24 AM  Depression screen PHQ 2/9  Decreased Interest 0 0 3 0 0  Down, Depressed, Hopeless 0 0 3 0 0  PHQ - 2 Score 0 0 6 0 0  Altered sleeping 0 0 3 0 0  Tired, decreased energy 0 0 3 0 0  Change in appetite 0 0 3 0 1  Feeling bad or failure about yourself  0 0 0 0 0  Trouble concentrating 0 0 3 0 0  Moving slowly or fidgety/restless 0 0 0 0 0  Suicidal thoughts 0 0 0 0 0  PHQ-9 Score 0 0 18 0 1  Difficult doing work/chores Not difficult at all Not difficult at all Very difficult  Not difficult at all    phq 9 is negative  Fall Risk:    10/19/2023    1:49 PM 07/13/2023   10:46 AM 03/22/2023    9:21 AM 12/21/2022    9:17 AM 09/20/2022    8:09 AM  Fall Risk   Falls in the past year? 0 1 0 1 0  Number falls in past yr: 0 0 0 0   Injury with Fall? 0 0 0 0   Risk for fall due to : No Fall Risks Impaired balance/gait No Fall Risks History of fall(s) No Fall Risks  Follow up Falls evaluation completed Falls prevention discussed;Education provided;Falls evaluation completed Falls prevention discussed;Education provided;Falls evaluation completed Falls prevention discussed;Education provided;Falls evaluation completed Falls prevention discussed      Assessment & Plan Cutaneous abscess of groin Tender, swollen groin area likely an abscess. Differential includes hidradenitis  suppurativa, but first occurrence in groin. Subcutaneous abscess may rupture soon. - Prescribe Augmentin  for 7 days. - Advise warm compresses to the area. - Instruct to contact if abscess does not improve  or worsens for potential surgical intervention.  Recurrent depression with seasonal affective features Recurrent depression with seasonal affective disorder managed with duloxetine  and Wellbutrin . Duloxetine  aids in depression and fibromyalgia. Wellbutrin  provides energy. Discussed seasonal increase of duloxetine  starting November 1st, tapering in spring. She consented to this plan. - Prescribe duloxetine  30 mg for additional dose during winter months. - Continue Wellbutrin  300 mg. - Continue duloxetine  60 mg.  Fibromyalgia Fibromyalgia managed with duloxetine , also aids in depression. Baclofen  used for muscle spasms. - Continue duloxetine  60 mg. - Prescribe baclofen  for muscle spasms.  Obstructive sleep apnea and circadian rhythm sleep disorder, shift work type Mild obstructive sleep apnea managed without CPAP. Armodafinil  used for alertness during night shifts. - Prescribe armodafinil . - Advise to continue positional therapy by sleeping on her side.  Prediabetes Prediabetes with last A1c at 6.1. Managed with metformin , contributing to weight loss. No significant diabetes symptoms reported. - Prescribe metformin  500 mg XR. - Order A1c test.  Morbid obesity due to excess calories Morbid obesity with BMI 42.73, weight reduced from 233 lbs to 228 lbs. Increased activity due to work. Advised dietary modifications to avoid vending machine snacks. - Continue metformin  500 mg XR. - Advise dietary modifications, including avoiding vending machine snacks and opting for healthier options like carrots, celery, and nuts.  Essential hypertension Hypertension managed with comastatin 40 mg and Bivolol 20 mg. Current BP 132/74, higher than desired. The patient prefers not to increase medication  dosage. - Instruct to maintain a blood pressure log with at least 10 readings by next visit. - Advise to reduce caffeine and salt intake. - Encourage physical activity.  Hyperlipidemia Hyperlipidemia managed with dietary modifications. - Continue dietary modifications.  History of iron deficiency anemia Iron deficiency anemia with no current symptoms of pica or unusual cravings. Colonoscopy up to date. - Order CBC and iron studies.  Systolic heart murmur Systolic heart murmur, 2/6 intensity, previously evaluated with echocardiogram.  General Health Maintenance Due for mammogram. Pneumonia and shingles vaccines discussed. She opted to receive both vaccines today, considering her work schedule allows for recovery time. - Administer pneumonia and shingles vaccines. - Schedule mammogram.

## 2023-10-20 ENCOUNTER — Ambulatory Visit: Payer: Self-pay | Admitting: Family Medicine

## 2023-10-20 LAB — CBC WITH DIFFERENTIAL/PLATELET
Absolute Lymphocytes: 2935 {cells}/uL (ref 850–3900)
Absolute Monocytes: 1009 {cells}/uL — ABNORMAL HIGH (ref 200–950)
Basophils Absolute: 46 {cells}/uL (ref 0–200)
Basophils Relative: 0.4 %
Eosinophils Absolute: 267 {cells}/uL (ref 15–500)
Eosinophils Relative: 2.3 %
HCT: 37.6 % (ref 35.0–45.0)
Hemoglobin: 12.3 g/dL (ref 11.7–15.5)
MCH: 28.3 pg (ref 27.0–33.0)
MCHC: 32.7 g/dL (ref 32.0–36.0)
MCV: 86.4 fL (ref 80.0–100.0)
MPV: 9.5 fL (ref 7.5–12.5)
Monocytes Relative: 8.7 %
Neutro Abs: 7343 {cells}/uL (ref 1500–7800)
Neutrophils Relative %: 63.3 %
Platelets: 298 Thousand/uL (ref 140–400)
RBC: 4.35 Million/uL (ref 3.80–5.10)
RDW: 14.4 % (ref 11.0–15.0)
Total Lymphocyte: 25.3 %
WBC: 11.6 Thousand/uL — ABNORMAL HIGH (ref 3.8–10.8)

## 2023-10-20 LAB — COMPREHENSIVE METABOLIC PANEL WITH GFR
AG Ratio: 1.7 (calc) (ref 1.0–2.5)
ALT: 15 U/L (ref 6–29)
AST: 15 U/L (ref 10–35)
Albumin: 4.1 g/dL (ref 3.6–5.1)
Alkaline phosphatase (APISO): 69 U/L (ref 37–153)
BUN: 12 mg/dL (ref 7–25)
CO2: 30 mmol/L (ref 20–32)
Calcium: 9.7 mg/dL (ref 8.6–10.4)
Chloride: 105 mmol/L (ref 98–110)
Creat: 0.66 mg/dL (ref 0.50–1.03)
Globulin: 2.4 g/dL (ref 1.9–3.7)
Glucose, Bld: 67 mg/dL (ref 65–99)
Potassium: 4 mmol/L (ref 3.5–5.3)
Sodium: 144 mmol/L (ref 135–146)
Total Bilirubin: 0.3 mg/dL (ref 0.2–1.2)
Total Protein: 6.5 g/dL (ref 6.1–8.1)
eGFR: 104 mL/min/1.73m2 (ref >=60)

## 2023-10-20 LAB — HEMOGLOBIN A1C
Hgb A1c MFr Bld: 6 % — ABNORMAL HIGH (ref <5.7)
Mean Plasma Glucose: 126 mg/dL
eAG (mmol/L): 7 mmol/L

## 2023-10-20 LAB — HEPATITIS B SURFACE ANTIBODY,QUALITATIVE: Hep B S Ab: REACTIVE — AB

## 2023-10-20 LAB — LIPID PANEL
Cholesterol: 197 mg/dL (ref <200)
HDL: 42 mg/dL — ABNORMAL LOW (ref >=50)
LDL Cholesterol (Calc): 123 mg/dL — ABNORMAL HIGH
Non-HDL Cholesterol (Calc): 155 mg/dL — ABNORMAL HIGH (ref <130)
Total CHOL/HDL Ratio: 4.7 (calc) (ref <5.0)
Triglycerides: 196 mg/dL — ABNORMAL HIGH (ref <150)

## 2023-10-20 LAB — IRON,TIBC AND FERRITIN PANEL
%SAT: 12 % — ABNORMAL LOW (ref 16–45)
Ferritin: 220 ng/mL (ref 16–232)
Iron: 40 ug/dL — ABNORMAL LOW (ref 45–160)
TIBC: 327 ug/dL (ref 250–450)

## 2023-10-29 ENCOUNTER — Other Ambulatory Visit: Payer: Self-pay

## 2023-11-03 ENCOUNTER — Ambulatory Visit: Payer: PRIVATE HEALTH INSURANCE

## 2023-11-03 ENCOUNTER — Telehealth: Admitting: Family Medicine

## 2023-11-03 DIAGNOSIS — J019 Acute sinusitis, unspecified: Secondary | ICD-10-CM | POA: Diagnosis not present

## 2023-11-03 MED ORDER — PREDNISONE 10 MG (21) PO TBPK: ORAL_TABLET | ORAL | 0 refills | Status: DC

## 2023-11-03 MED ORDER — FLUTICASONE PROPIONATE 50 MCG/ACT NA SUSP: 2.0000 | Freq: Every day | NASAL | 0 refills | Status: AC | PRN

## 2023-11-03 NOTE — Progress Notes (Signed)
 Virtual Visit Consent   Kathleen Conley, you are scheduled for a virtual visit with a [facility] provider today. Just as with appointments in the office, your consent must be obtained to participate. Your consent will be active for this visit and any virtual visit you may have with one of our providers in the next 365 days. If you have a MyChart account, a copy of this consent can be sent to you electronically.  As this is a virtual visit, video technology does not allow for your provider to perform a traditional examination. This may limit your provider's ability to fully assess your condition. If your provider identifies any concerns that need to be evaluated in person or the need to arrange testing (such as labs, EKG, etc.), we will make arrangements to do so. Although advances in technology are sophisticated, we cannot ensure that it will always work on either your end or our end. If the connection with a video visit is poor, the visit may have to be switched to a telephone visit. With either a video or telephone visit, we are not always able to ensure that we have a secure connection.  By engaging in this virtual visit, you consent to the provision of healthcare and authorize for your insurance to be billed (if applicable) for the services provided during this visit. Depending on your insurance coverage, you may receive a charge related to this service.  I need to obtain your verbal consent now. Are you willing to proceed with your visit today? Nona Gracey has provided verbal consent on 11/03/2023 for a virtual visit (video or telephone). Kathleen Conley, [place]  Date: 11/03/2023 10:34 AM   Virtual Visit via Video Note   IRoosvelt Conley, connected with  Kailey Esquilin  ([id number], 07-27-1960) on 11/03/23 at 10:30 AM EDT by a video-enabled telemedicine application and verified that I am speaking with the correct person using two identifiers.  Location: Patient: Virtual Visit Location  Patient: Home Provider: Virtual Visit Location Provider: Home Office   I discussed the limitations of evaluation and management by telemedicine and the availability of in person appointments. The patient expressed understanding and agreed to proceed.    History of Present Illness: Kathleen Conley is a 53 y.o. who identifies as a female who was assigned female at birth, and is being seen today for c/o starting feeling really bad on Thursday. Pt c/o headache, sore throat and feeling really bad.  Pt denies sick contacts.  Pt states a small cough, no fever.  Pt denies N/V.  Pt states has sinus pressure and really bad head pain and very painful when swallowing. Pt states taking Tylenol  and Ibuprofen. Pt denies spots in back of throat.  Pt states she has some clear mucus. Pt states she takes Tylenol  500mg  two tabs but not as helpful.   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   Flat foot 01/28/2016   Aortic sclerosis 10/14/2015   Midline low back pain without sciatica 09/20/2015   Leukocytosis 11/29/2014   Osteoarthritis of left knee 11/20/2014   History of ventral hernia repair 10/27/2014   Allergic rhinitis 08/17/2014   Axillary hidradenitis suppurativa 08/17/2014  Baker cyst 08/17/2014   Chronic constipation 08/17/2014   Major depression in remission (HCC) 08/17/2014   Engages in travel abroad 08/17/2014   Fibromyalgia 08/17/2014   Cardiac murmur 08/17/2014   Dysmetabolic syndrome 08/17/2014   Plantar fasciitis 08/17/2014   Beat, premature ventricular 08/17/2014   Abnormal neurological finding suggestive of lumbar-level spinal disorder 08/17/2014   Obstructive apnea  12/02/2013   Essential (primary) hypertension 12/09/2009   Hypertrichosis 12/09/2009    Allergies:  Allergies  Allergen Reactions   Lisinopril Cough        Norvasc  [Amlodipine  Besylate]    Hctz [Hydrochlorothiazide] Other (See Comments)    Burning sensation on her feet    Medications:  Current Outpatient Medications:    predniSONE  (STERAPRED UNI-PAK 21 TAB) 10 MG (21) TBPK tablet, Take following package directions., Disp: 21 tablet, Rfl: 0   acetaminophen  (TYLENOL ) 500 MG tablet, Take 1 tablet (500 mg total) by mouth every 6 (six) hours as needed. Max of 3 grams daily or 6 pills dialy (Patient taking differently: Take 1,000 mg by mouth every 6 (six) hours as needed for moderate pain (pain score 4-6) or headache.), Disp: 30 tablet, Rfl: 0   albuterol  (VENTOLIN  HFA) 108 (90 Base) MCG/ACT inhaler, Inhale 2 puffs into the lungs every 6 (six) hours as needed for wheezing or shortness of breath., Disp: 8 g, Rfl: 0   amoxicillin -clavulanate (AUGMENTIN ) 875-125 MG tablet, Take 1 tablet by mouth 2 (two) times daily., Disp: 14 tablet, Rfl: 0   Armodafinil  (NUVIGIL ) 150 MG tablet, Take 1 tablet (150 mg total) by mouth daily as needed (shift work--sleepiness)., Disp: 90 tablet, Rfl: 0   baclofen  (LIORESAL ) 10 MG tablet, Take 1 tablet (10 mg total) by mouth 3 (three) times daily., Disp: 90 each, Rfl: 0   buPROPion  (WELLBUTRIN  XL) 300 MG 24 hr tablet, Take 1 tablet (300 mg total) by mouth daily., Disp: 90 tablet, Rfl: 1   cetirizine (ZYRTEC) 10 MG tablet, Take 10 mg by mouth every evening., Disp: , Rfl:    Cholecalciferol  (VITAMIN D ) 2000 UNITS tablet, Take 2,000 Units by mouth every evening. , Disp: , Rfl:    docusate sodium  (COLACE) 100 MG capsule, Take 100 mg by mouth daily as needed for moderate constipation. , Disp: , Rfl:    DULoxetine  (CYMBALTA ) 30 MG capsule, Take 1 capsule (30 mg total) by mouth daily., Disp: 90 capsule, Rfl: 0   DULoxetine  (CYMBALTA ) 60 MG capsule, Take 1 capsule (60 mg total)  by mouth daily., Disp: 90 capsule, Rfl: 1   ferrous sulfate  325 (65 FE) MG tablet, Take 325 mg by mouth every evening. , Disp: , Rfl:    fluticasone  (FLONASE ) 50 MCG/ACT nasal spray, Place 2 sprays into both nostrils daily as needed for allergies., Disp: 48 g, Rfl: 0   metFORMIN  (GLUCOPHAGE -XR) 500 MG 24 hr tablet, Take 1 tablet (500 mg total) by mouth daily with breakfast., Disp: 90 tablet, Rfl: 0   Multiple Vitamin (MULTIVITAMIN WITH MINERALS) TABS tablet, Take 1 tablet by mouth every evening., Disp: , Rfl:    Nebivolol  HCl 20 MG TABS, Take 1 tablet (20 mg total) by mouth daily., Disp: 90 tablet, Rfl: 1   olmesartan  (BENICAR ) 40 MG tablet, Take 1 tablet (40 mg total) by mouth daily., Disp: 90 tablet, Rfl: 1   Travoprost , BAK Free, (TRAVATAN ) 0.004 % SOLN ophthalmic solution, Place 1 drop into both eyes at bedtime., Disp: 7.5 mL, Rfl: 2   Travoprost , BAK Free, (TRAVATAN ) 0.004 % SOLN ophthalmic solution,  Place 1 drop into both eyes at bedtime., Disp: 7.5 mL, Rfl: 0  Observations/Objective: Patient is well-developed, well-nourished in no acute distress.  Resting comfortably at home.  Head is normocephalic, atraumatic.  No labored breathing.  Speech is clear and coherent with logical content.  Patient is alert and oriented at baseline.    Assessment and Plan: 1. Acute non-recurrent sinusitis, unspecified location (Primary) - predniSONE  (STERAPRED UNI-PAK 21 TAB) 10 MG (21) TBPK tablet; Take following package directions.  Dispense: 21 tablet; Refill: 0 - fluticasone  (FLONASE ) 50 MCG/ACT nasal spray; Place 2 sprays into both nostrils daily as needed for allergies.  Dispense: 48 g; Refill: 0  -Differential diagnosis included viral sinus infection, URI, Flu or COVID  -Start Prednisone  and Flonase  to help with sinus pressure and headaches -Pt advised to F/U with PCP for persistent or worsening symptoms  Follow Up Instructions: I discussed the assessment and treatment plan with the patient. The  patient was provided an opportunity to ask questions and all were answered. The patient agreed with the plan and demonstrated an understanding of the instructions.  A copy of instructions were sent to the patient via MyChart unless otherwise noted below.    The patient was advised to call back or seek an in-person evaluation if the symptoms worsen or if the condition fails to improve as anticipated.    Kathleen Mater, PA-C

## 2023-11-03 NOTE — Patient Instructions (Signed)
 Kathleen Conley, thank you for joining [name], PA-C for today's virtual visit.  While this provider is not your primary care provider (PCP), if your PCP is located in our provider database this encounter information will be shared with them immediately following your visit.   A [facility] MyChart account gives you access to today's visit and all your visits, tests, and labs performed at [facility]  click here if you don't have a [facility] MyChart account or go to [email or web address]  Consent: (Patient) Kathleen Conley provided verbal consent for this virtual visit at the beginning of the encounter.  Current Medications:  Current Outpatient Medications:    predniSONE  (STERAPRED UNI-PAK 21 TAB) 10 MG (21) TBPK tablet, Take following package directions., Disp: 21 tablet, Rfl: 0   acetaminophen  (TYLENOL ) 500 MG tablet, Take 1 tablet (500 mg total) by mouth every 6 (six) hours as needed. Max of 3 grams daily or 6 pills dialy (Patient taking differently: Take 1,000 mg by mouth every 6 (six) hours as needed for moderate pain (pain score 4-6) or headache.), Disp: 30 tablet, Rfl: 0   albuterol  (VENTOLIN  HFA) 108 (90 Base) MCG/ACT inhaler, Inhale 2 puffs into the lungs every 6 (six) hours as needed for wheezing or shortness of breath., Disp: 8 g, Rfl: 0   amoxicillin -clavulanate (AUGMENTIN ) 875-125 MG tablet, Take 1 tablet by mouth 2 (two) times daily., Disp: 14 tablet, Rfl: 0   Armodafinil  (NUVIGIL ) 150 MG tablet, Take 1 tablet (150 mg total) by mouth daily as needed (shift work--sleepiness)., Disp: 90 tablet, Rfl: 0   baclofen  (LIORESAL ) 10 MG tablet, Take 1 tablet (10 mg total) by mouth 3 (three) times daily., Disp: 90 each, Rfl: 0   buPROPion  (WELLBUTRIN  XL) 300 MG 24 hr tablet, Take 1 tablet (300 mg total) by mouth daily., Disp: 90 tablet, Rfl: 1   cetirizine (ZYRTEC) 10 MG tablet, Take 10 mg by mouth every evening., Disp: , Rfl:    Cholecalciferol  (VITAMIN D ) 2000  UNITS tablet, Take 2,000 Units by mouth every evening. , Disp: , Rfl:    docusate sodium  (COLACE) 100 MG capsule, Take 100 mg by mouth daily as needed for moderate constipation. , Disp: , Rfl:    DULoxetine  (CYMBALTA ) 30 MG capsule, Take 1 capsule (30 mg total) by mouth daily., Disp: 90 capsule, Rfl: 0   DULoxetine  (CYMBALTA ) 60 MG capsule, Take 1 capsule (60 mg total) by mouth daily., Disp: 90 capsule, Rfl: 1   ferrous sulfate  325 (65 FE) MG tablet, Take 325 mg by mouth every evening. , Disp: , Rfl:    fluticasone  (FLONASE ) 50 MCG/ACT nasal spray, Place 2 sprays into both nostrils daily as needed for allergies., Disp: 48 g, Rfl: 0   metFORMIN  (GLUCOPHAGE -XR) 500 MG 24 hr tablet, Take 1 tablet (500 mg total) by mouth daily with breakfast., Disp: 90 tablet, Rfl: 0   Multiple Vitamin (MULTIVITAMIN WITH MINERALS) TABS tablet, Take 1 tablet by mouth every evening., Disp: , Rfl:    Nebivolol  HCl 20 MG TABS, Take 1 tablet (20 mg total) by mouth daily., Disp: 90 tablet, Rfl: 1   olmesartan  (BENICAR ) 40 MG tablet, Take 1 tablet (40 mg total) by mouth daily., Disp: 90 tablet, Rfl: 1   Travoprost , BAK Free, (TRAVATAN ) 0.004 % SOLN ophthalmic solution, Place 1 drop into both eyes at bedtime., Disp: 7.5 mL, Rfl: 2   Travoprost , BAK Free, (TRAVATAN ) 0.004 % SOLN ophthalmic solution, Place 1 drop into both eyes at bedtime., Disp: 7.5 mL, Rfl:  0   Medications ordered in this encounter:  Meds ordered this encounter  Medications   predniSONE  (STERAPRED UNI-PAK 21 TAB) 10 MG (21) TBPK tablet    Sig: Take following package directions.    Dispense:  21 tablet    Refill:  0    Please dispense one standard blister pack taper.   fluticasone  (FLONASE ) 50 MCG/ACT nasal spray    Sig: Place 2 sprays into both nostrils daily as needed for allergies.    Dispense:  48 g    Refill:  0     *If you need refills on other medications prior to your next appointment, please contact your pharmacy*  Follow-Up: Call back or  seek an in-person evaluation if the symptoms worsen or if the condition fails to improve as anticipated.  [facility] [phone]  Other Instructions Sinus Infection, Adult A sinus infection, also called sinusitis, is inflammation of your sinuses. Sinuses are hollow spaces in the bones around your face. Your sinuses are located: Around your eyes. In the middle of your forehead. Behind your nose. In your cheekbones. Mucus normally drains out of your sinuses. When your nasal tissues become inflamed or swollen, mucus can become trapped or blocked. This allows bacteria, viruses, and fungi to grow, which leads to infection. Most infections of the sinuses are caused by a virus. A sinus infection can develop quickly. It can last for up to 4 weeks (acute) or for more than 12 weeks (chronic). A sinus infection often develops after a cold. What are the causes? This condition is caused by anything that creates swelling in the sinuses or stops mucus from draining. This includes: Allergies. Asthma. Infection from bacteria or viruses. Deformities or blockages in your nose or sinuses. Abnormal growths in the nose (nasal polyps). Pollutants, such as chemicals or irritants in the air. Infection from fungi. This is rare. What increases the risk? You are more likely to develop this condition if you: Have a weak body defense system (immune system). Do a lot of swimming or diving. Overuse nasal sprays. Smoke. What are the signs or symptoms? The main symptoms of this condition are pain and a feeling of pressure around the affected sinuses. Other symptoms include: Stuffy nose or congestion that makes it difficult to breathe through your nose. Thick yellow or greenish drainage from your nose. Tenderness, swelling, and warmth over the affected sinuses. A cough that may get worse at night. Decreased sense of smell and taste. Extra mucus that collects in the throat or the back of the nose  (postnasal drip) causing a sore throat or bad breath. Tiredness (fatigue). Fever. How is this diagnosed? This condition is diagnosed based on: Your symptoms. Your medical history. A physical exam. Tests to find out if your condition is acute or chronic. This may include: Checking your nose for nasal polyps. Viewing your sinuses using a device that has a light (endoscope). Testing for allergies or bacteria. Imaging tests, such as an MRI or CT scan. In rare cases, a bone biopsy may be done to rule out more serious types of fungal sinus disease. How is this treated? Treatment for a sinus infection depends on the cause and whether your condition is chronic or acute. If caused by a virus, your symptoms should go away on their own within 10 days. You may be given medicines to relieve symptoms. They include: Medicines that shrink swollen nasal passages (decongestants). A spray that eases inflammation of the nostrils (topical intranasal corticosteroids). Rinses that  help get rid of thick mucus in your nose (nasal saline washes). Medicines that treat allergies (antihistamines). Over-the-counter pain relievers. If caused by bacteria, your health care provider may recommend waiting to see if your symptoms improve. Most bacterial infections will get better without antibiotic medicine. You may be given antibiotics if you have: A severe infection. A weak immune system. If caused by narrow nasal passages or nasal polyps, surgery may be needed. Follow these instructions at home: Medicines Take, use, or apply over-the-counter and prescription medicines only as told by your health care provider. These may include nasal sprays. If you were prescribed an antibiotic medicine, take it as told by your health care provider. Do not stop taking the antibiotic even if you start to feel better. Hydrate and humidify  Drink enough fluid to keep your urine pale yellow. Staying hydrated will help to thin your  mucus. Use a cool mist humidifier to keep the humidity level in your home above 50%. Inhale steam for 10-15 minutes, 3-4 times a day, or as told by your health care provider. You can do this in the bathroom while a hot shower is running. Limit your exposure to cool or dry air. Rest Rest as much as possible. Sleep with your head raised (elevated). Make sure you get enough sleep each night. General instructions  Apply a warm, moist washcloth to your face 3-4 times a day or as told by your health care provider. This will help with discomfort. Use nasal saline washes as often as told by your health care provider. Wash your hands often with soap and water to reduce your exposure to germs. If soap and water are not available, use hand sanitizer. Do not smoke. Avoid being around people who are smoking (secondhand smoke). Keep all follow-up visits. This is important. Contact a health care provider if: You have a fever. Your symptoms get worse. Your symptoms do not improve within 10 days. Get help right away if: You have a severe headache. You have persistent vomiting. You have severe pain or swelling around your face or eyes. You have vision problems. You develop confusion. Your neck is stiff. You have trouble breathing. These symptoms may be an emergency. Get help right away. Call 911. Do not wait to see if the symptoms will go away. Do not drive yourself to the hospital. Summary A sinus infection is soreness and inflammation of your sinuses. Sinuses are hollow spaces in the bones around your face. This condition is caused by nasal tissues that become inflamed or swollen. The swelling traps or blocks the flow of mucus. This allows bacteria, viruses, and fungi to grow, which leads to infection. If you were prescribed an antibiotic medicine, take it as told by your health care provider. Do not stop taking the antibiotic even if you start to feel better. Keep all follow-up visits. This is  important. This information is not intended to replace advice given to you by your health care provider. Make sure you discuss any questions you have with your health care provider. Document Revised: 01/11/2021 Document Reviewed: 01/11/2021 Elsevier Patient Education  2024 [employer]   If you have been instructed to have an in-person evaluation today at a local Urgent Care facility, please use the link below. It will take you to a list of all of our available  Urgent Cares, including address, phone number and hours of operation. Please do not delay care.   Urgent Cares  If you or a family member do  not have a primary care provider, use the link below to schedule a visit and establish care. When you choose a [facility] primary care physician or advanced practice provider, you gain a long-term partner in health. Find a Primary Care Provider  Learn more about [facility] in-office and virtual care options: [facility] Get Care Now

## 2023-11-05 ENCOUNTER — Encounter: Payer: Self-pay | Admitting: Family Medicine

## 2023-11-07 ENCOUNTER — Other Ambulatory Visit: Payer: Self-pay

## 2023-11-07 ENCOUNTER — Ambulatory Visit (INDEPENDENT_AMBULATORY_CARE_PROVIDER_SITE_OTHER): Admitting: Family Medicine

## 2023-11-07 ENCOUNTER — Encounter: Payer: Self-pay | Admitting: Family Medicine

## 2023-11-07 VITALS — BP 132/80 | HR 75 | Resp 16 | Ht 62.0 in | Wt 221.6 lb

## 2023-11-07 DIAGNOSIS — H6692 Otitis media, unspecified, left ear: Secondary | ICD-10-CM

## 2023-11-07 DIAGNOSIS — H938X2 Other specified disorders of left ear: Secondary | ICD-10-CM

## 2023-11-07 DIAGNOSIS — R0982 Postnasal drip: Secondary | ICD-10-CM | POA: Diagnosis not present

## 2023-11-07 DIAGNOSIS — J029 Acute pharyngitis, unspecified: Secondary | ICD-10-CM | POA: Diagnosis not present

## 2023-11-07 LAB — POCT RAPID STREP A (OFFICE): Rapid Strep A Screen: NEGATIVE

## 2023-11-07 MED ORDER — DOXYCYCLINE HYCLATE 100 MG PO TABS
100.0000 mg | ORAL_TABLET | Freq: Two times a day (BID) | ORAL | 0 refills | Status: DC
  Filled 2023-11-07: qty 14, 7d supply, fill #0

## 2023-11-07 NOTE — Progress Notes (Signed)
 Name: Kathleen Conley   MRN: [id number]    DOB: June 04, 1970   Date:11/07/2023       Progress Note  Subjective  Chief Complaint  Chief Complaint  Patient presents with   Sore Throat    Sx since past Thursday, has a video visit on 11/03/23, currently taking prednisone     Discussed the use of AI scribe software for clinical note transcription with the patient, who gave verbal consent to proceed.  History of Present Illness Jeimy Bickert is a 53 year old female who presents with a sore throat and ear fullness.  She developed a sore throat approximately five days ago, accompanied by a severe headache. She initially sought a virtual consultation and was advised it might be a sinus infection. She was prescribed Flonase  nasal spray and a prednisone  taper starting at 60 mg down to 10 mg.  She completed a course of Augmentin  for an abscess on right groin prior to the onset of her current symptoms. She has not taken a COVID test at home. She experiences post-nasal drip and drainage, which she attributes to her cough. No phlegm is produced with her cough, noting that she is just clearing her throat.  She describes a sensation of fullness in her left ear, stating 'it's clogged' and affecting her hearing. No significant changes in appetite, though she notes it is 'not perfect'. She also mentions feeling fatigued and having issues with her feet, though these are not elaborated upon in detail.    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   Flat foot 01/28/2016   Aortic sclerosis 10/14/2015   Midline low back pain without sciatica 09/20/2015   Leukocytosis 11/29/2014   Osteoarthritis of left knee  11/20/2014   History of ventral hernia repair 10/27/2014   Allergic rhinitis 08/17/2014   Axillary hidradenitis suppurativa 08/17/2014   Baker cyst 08/17/2014   Chronic constipation 08/17/2014   Major depression in remission (HCC) 08/17/2014   Engages in travel abroad 08/17/2014   Fibromyalgia 08/17/2014   Cardiac murmur 08/17/2014   Dysmetabolic syndrome 08/17/2014   Plantar fasciitis 08/17/2014   Beat, premature ventricular 08/17/2014   Abnormal neurological finding suggestive of lumbar-level spinal disorder 08/17/2014   Obstructive apnea 12/02/2013   Essential (primary) hypertension 12/09/2009   Hypertrichosis 12/09/2009    Social History   Tobacco Use   Smoking status: Never   Smokeless tobacco: Never  Substance Use Topics   Alcohol use: No    Alcohol/week: 0.0 standard drinks of alcohol     Current Outpatient Medications:    acetaminophen  (TYLENOL ) 500 MG tablet, Take 1 tablet (500 mg total) by mouth every 6 (six) hours as needed. Max of 3 grams daily or 6 pills dialy (Patient taking differently: Take 1,000 mg by mouth every 6 (six) hours as needed for moderate pain (pain score 4-6) or headache.), Disp: 30 tablet, Rfl: 0   albuterol  (VENTOLIN  HFA) 108 (90 Base) MCG/ACT inhaler, Inhale 2 puffs into the lungs every 6 (six) hours as needed for wheezing or shortness of breath., Disp: 8 g, Rfl: 0   Armodafinil  (NUVIGIL ) 150 MG tablet, Take 1 tablet (150 mg total) by mouth daily as needed (shift work--sleepiness)., Disp: 90 tablet,  Rfl: 0   baclofen  (LIORESAL ) 10 MG tablet, Take 1 tablet (10 mg total) by mouth 3 (three) times daily., Disp: 90 each, Rfl: 0   buPROPion  (WELLBUTRIN  XL) 300 MG 24 hr tablet, Take 1 tablet (300 mg total) by mouth daily., Disp: 90 tablet, Rfl: 1   cetirizine (ZYRTEC) 10 MG tablet, Take 10 mg by mouth every evening., Disp: , Rfl:    Cholecalciferol  (VITAMIN D ) 2000 UNITS tablet, Take 2,000 Units by mouth every evening. , Disp: , Rfl:    docusate sodium   (COLACE) 100 MG capsule, Take 100 mg by mouth daily as needed for moderate constipation. , Disp: , Rfl:    DULoxetine  (CYMBALTA ) 30 MG capsule, Take 1 capsule (30 mg total) by mouth daily., Disp: 90 capsule, Rfl: 0   DULoxetine  (CYMBALTA ) 60 MG capsule, Take 1 capsule (60 mg total) by mouth daily., Disp: 90 capsule, Rfl: 1   ferrous sulfate  325 (65 FE) MG tablet, Take 325 mg by mouth every evening. , Disp: , Rfl:    fluticasone  (FLONASE ) 50 MCG/ACT nasal spray, Place 2 sprays into both nostrils daily as needed for allergies., Disp: 48 g, Rfl: 0   metFORMIN  (GLUCOPHAGE -XR) 500 MG 24 hr tablet, Take 1 tablet (500 mg total) by mouth daily with breakfast., Disp: 90 tablet, Rfl: 0   Multiple Vitamin (MULTIVITAMIN WITH MINERALS) TABS tablet, Take 1 tablet by mouth every evening., Disp: , Rfl:    Nebivolol  HCl 20 MG TABS, Take 1 tablet (20 mg total) by mouth daily., Disp: 90 tablet, Rfl: 1   olmesartan  (BENICAR ) 40 MG tablet, Take 1 tablet (40 mg total) by mouth daily., Disp: 90 tablet, Rfl: 1   predniSONE  (STERAPRED UNI-PAK 21 TAB) 10 MG (21) TBPK tablet, Take following package directions., Disp: 21 tablet, Rfl: 0   Travoprost , BAK Free, (TRAVATAN ) 0.004 % SOLN ophthalmic solution, Place 1 drop into both eyes at bedtime., Disp: 7.5 mL, Rfl: 2   Travoprost , BAK Free, (TRAVATAN ) 0.004 % SOLN ophthalmic solution, Place 1 drop into both eyes at bedtime., Disp: 7.5 mL, Rfl: 0  Allergies  Allergen Reactions   Lisinopril Cough        Norvasc  [Amlodipine  Besylate]    Hctz [Hydrochlorothiazide] Other (See Comments)    Burning sensation on her feet     ROS  Ten systems reviewed and is negative except as mentioned in HPI    Objective  Vitals:   11/07/23 1305  BP: 132/80  Pulse: 75  Resp: 16  SpO2: 92%  Weight: 221 lb 9.6 oz (100.5 kg)  Height: 5' 2 (1.575 m)    Body mass index is 40.53 kg/m.  Physical Exam CONSTITUTIONAL: Patient appears well-developed and well-nourished. No  distress. HEENT: Head atraumatic, normocephalic, neck supple. Throat slightly irritated and red. Left ear showed erythematous TM, no effusion  CARDIOVASCULAR: Normal rate, regular rhythm and normal heart sounds. No murmur heard. No BLE edema. PULMONARY: Effort normal and breath sounds normal. Lungs clear to auscultation. No respiratory distress. PSYCHIATRIC: Patient has a normal mood and affect. Behavior is normal. Judgment and thought content normal.  Recent Results (from the past 2160 hours)  CBC with Differential/Platelet     Status: Abnormal   Collection Time: 10/19/23  2:41 PM  Result Value Ref Range   WBC 11.6 (H) 3.8 - 10.8 Thousand/uL   RBC 4.35 3.80 - 5.10 Million/uL   Hemoglobin 12.3 11.7 - 15.5 g/dL   HCT 62.3 64.9 - 54.9 %   MCV 86.4 80.0 - 100.0  fL   MCH 28.3 27.0 - 33.0 pg   MCHC 32.7 32.0 - 36.0 g/dL    Comment: For adults, a slight decrease in the calculated MCHC value (in the range of 30 to 32 g/dL) is most likely not clinically significant; however, it should be interpreted with caution in correlation with other red cell parameters and the patient's clinical condition.    RDW 14.4 11.0 - 15.0 %   Platelets 298 140 - 400 Thousand/uL   MPV 9.5 7.5 - 12.5 fL   Neutro Abs 7,343 1,500 - 7,800 cells/uL   Absolute Lymphocytes 2,935 850 - 3,900 cells/uL   Absolute Monocytes 1,009 (H) 200 - 950 cells/uL   Eosinophils Absolute 267 15 - 500 cells/uL   Basophils Absolute 46 0 - 200 cells/uL   Neutrophils Relative % 63.3 %   Total Lymphocyte 25.3 %   Monocytes Relative 8.7 %   Eosinophils Relative 2.3 %   Basophils Relative 0.4 %  Lipid panel     Status: Abnormal   Collection Time: 10/19/23  2:41 PM  Result Value Ref Range   Cholesterol 197 <200 mg/dL   HDL 42 (L) > OR = 50 mg/dL   Triglycerides 803 (H) <150 mg/dL   LDL Cholesterol (Calc) 123 (H) mg/dL (calc)    Comment: Reference range: <100 . Desirable range <100 mg/dL for primary prevention;   <70 mg/dL for  patients with CHD or diabetic patients  with > or = 2 CHD risk factors. SABRA LDL-C is now calculated using the Martin-Hopkins  calculation, which is a validated novel method providing  better accuracy than the Friedewald equation in the  estimation of LDL-C.  Gladis APPLETHWAITE et al. SANDREA. 7986;689(80): 2061-2068  (http://education.[facility].com/faq/FAQ164)    Total CHOL/HDL Ratio 4.7 <5.0 (calc)   Non-HDL Cholesterol (Calc) 155 (H) <130 mg/dL (calc)    Comment: For patients with diabetes plus 1 major ASCVD risk  factor, treating to a non-HDL-C goal of <100 mg/dL  (LDL-C of <29 mg/dL) is considered a therapeutic  option.   Hemoglobin A1c     Status: Abnormal   Collection Time: 10/19/23  2:41 PM  Result Value Ref Range   Hgb A1c MFr Bld 6.0 (H) <5.7 %    Comment: For someone without known diabetes, a hemoglobin  A1c value between 5.7% and 6.4% is consistent with prediabetes and should be confirmed with a  follow-up test. . For someone with known diabetes, a value <7% indicates that their diabetes is well controlled. A1c targets should be individualized based on duration of diabetes, age, comorbid conditions, and other considerations. . This assay result is consistent with an increased risk of diabetes. . Currently, no consensus exists regarding use of hemoglobin A1c for diagnosis of diabetes for children. .    Mean Plasma Glucose 126 mg/dL   eAG (mmol/L) 7.0 mmol/L  Iron, TIBC and Ferritin Panel     Status: Abnormal   Collection Time: 10/19/23  2:41 PM  Result Value Ref Range   Iron 40 (L) 45 - 160 mcg/dL   TIBC 672 749 - 549 mcg/dL (calc)   %SAT 12 (L) 16 - 45 % (calc)   Ferritin 220 16 - 232 ng/mL  Comprehensive metabolic panel with GFR     Status: None   Collection Time: 10/19/23  2:41 PM  Result Value Ref Range   Glucose, Bld 67 65 - 99 mg/dL    Comment: .            Fasting  reference interval .    BUN 12 7 - 25 mg/dL   Creat 9.33 9.49 - 8.96 mg/dL   eGFR 895  > OR = 60 fO/fpw/8.26f7   BUN/Creatinine Ratio SEE NOTE: 6 - 22 (calc)    Comment:    Not Reported: BUN and Creatinine are within    reference range. .    Sodium 144 135 - 146 mmol/L   Potassium 4.0 3.5 - 5.3 mmol/L   Chloride 105 98 - 110 mmol/L   CO2 30 20 - 32 mmol/L   Calcium 9.7 8.6 - 10.4 mg/dL   Total Protein 6.5 6.1 - 8.1 g/dL   Albumin 4.1 3.6 - 5.1 g/dL   Globulin 2.4 1.9 - 3.7 g/dL (calc)   AG Ratio 1.7 1.0 - 2.5 (calc)   Total Bilirubin 0.3 0.2 - 1.2 mg/dL   Alkaline phosphatase (APISO) 69 37 - 153 U/L   AST 15 10 - 35 U/L   ALT 15 6 - 29 U/L  Hepatitis B Surface AntiBODY     Status: Abnormal   Collection Time: 10/19/23  2:41 PM  Result Value Ref Range   Hep B S Ab REACTIVE (A) NON-REACTIVE  POCT rapid strep A     Status: None   Collection Time: 11/07/23  1:19 PM  Result Value Ref Range   Rapid Strep A Screen Negative Negative      Assessment & Plan Acute pharyngitis with suspected acute sinusitis and left eustachian tube dysfunction Negative rapid strep test. Symptoms suggest viral etiology but possible bacterial sinusitis due to persistent symptoms and left ear fullness. Post-nasal drip likely contributing to throat irritation.  - Prescribed doxycycline  for 7 days for otitis medial left . - Provided work excuse for Monday.

## 2023-11-22 ENCOUNTER — Other Ambulatory Visit: Payer: Self-pay

## 2023-11-22 ENCOUNTER — Other Ambulatory Visit (HOSPITAL_BASED_OUTPATIENT_CLINIC_OR_DEPARTMENT_OTHER): Payer: Self-pay

## 2023-11-23 ENCOUNTER — Other Ambulatory Visit: Payer: Self-pay

## 2023-11-23 MED ORDER — TRAVOPROST (BAK FREE) 0.004 % OP SOLN
1.0000 [drp] | Freq: Every day | OPHTHALMIC | 3 refills | Status: AC
  Filled 2023-11-23: qty 2.5, 25d supply, fill #0
  Filled 2023-12-20: qty 2.5, 25d supply, fill #1
  Filled 2024-01-24: qty 2.5, 25d supply, fill #2
  Filled 2024-02-25: qty 2.5, 25d supply, fill #3
  Filled 2024-03-26: qty 5, 90d supply, fill #4
  Filled 2024-03-27: qty 7.5, 84d supply, fill #0

## 2023-11-26 ENCOUNTER — Other Ambulatory Visit: Payer: Self-pay

## 2023-11-27 ENCOUNTER — Other Ambulatory Visit: Payer: Self-pay

## 2023-11-27 ENCOUNTER — Ambulatory Visit
Admission: RE | Admit: 2023-11-27 | Discharge: 2023-11-27 | Disposition: A | Discharge status: Home / Self Care | Source: Ambulatory Visit | Attending: Emergency Medicine | Admitting: Emergency Medicine

## 2023-11-27 VITALS — BP 135/76 | HR 75 | Temp 101.4°F | Resp 20

## 2023-11-27 DIAGNOSIS — B349 Viral infection, unspecified: Secondary | ICD-10-CM

## 2023-11-27 DIAGNOSIS — U071 COVID-19: Secondary | ICD-10-CM

## 2023-11-27 LAB — POC SOFIA SARS ANTIGEN FIA: SARS Coronavirus 2 Ag: NEGATIVE

## 2023-11-27 MED ORDER — GUAIFENESIN-CODEINE 100-10 MG/5ML PO SOLN
5.0000 mL | Freq: Four times a day (QID) | ORAL | 0 refills | Status: DC | PRN
  Filled 2023-11-27: qty 120, 6d supply, fill #0

## 2023-11-27 MED ORDER — ACETAMINOPHEN 325 MG PO TABS
650.0000 mg | ORAL_TABLET | Freq: Once | ORAL | Status: AC
  Administered 2023-11-27: 650 mg via ORAL

## 2023-11-27 MED ORDER — ALBUTEROL SULFATE HFA 108 (90 BASE) MCG/ACT IN AERS
2.0000 | INHALATION_SPRAY | Freq: Four times a day (QID) | RESPIRATORY_TRACT | 0 refills | Status: AC | PRN
  Filled 2023-11-27: qty 18, 25d supply, fill #0

## 2023-11-27 NOTE — ED Triage Notes (Signed)
 Patient reports cough with yellowish -greenish mucus, headache and sore throat that started yesterday. Patient reports taking Tylenol  and Robitussin  with no relief. Rates headache 6/10 and sore throat 5/10.

## 2023-11-27 NOTE — Discharge Instructions (Signed)
 Your symptoms today are most likely being caused by a virus and should steadily improve in time it can take up to 7 to 10 days before you truly start to see a turnaround however things will get better  May use inhaler 6 hours as needed for wheezing or shortness of breath, take dose before bed if wheezing is primarily at nighttime  May use cough syrup every 6 hours as needed for comfort but please be mindful this can make dry    You can take Tylenol  and/or Ibuprofen as needed for fever reduction and pain relief.   For cough: honey 1/2 to 1 teaspoon (you can dilute the honey in water or another fluid).  You can also use guaifenesin and dextromethorphan for cough. You can use a humidifier for chest congestion and cough.  If you don't have a humidifier, you can sit in the bathroom with the hot shower running.      For sore throat: try warm salt water gargles, cepacol lozenges, throat spray, warm tea or water with lemon/honey, popsicles or ice, or OTC cold relief medicine for throat discomfort.   For congestion: take a daily anti-histamine like Zyrtec, Claritin , and a oral decongestant, such as pseudoephedrine.  You can also use Flonase  1-2 sprays in each nostril daily.   It is important to stay hydrated: drink plenty of fluids (water, gatorade/powerade/pedialyte, juices, or teas) to keep your throat moisturized and help further relieve irritation/discomfort.

## 2023-11-27 NOTE — ED Provider Notes (Signed)
 Kathleen Conley    CSN: [id number] Arrival date & time: 11/27/23  1635      History   Chief Complaint Chief Complaint  Patient presents with   Cough    My head is hurting and I have wheezing. - Entered by patient   Sore Throat   Headache    HPI Kathleen Conley is a 53 y.o. female.   Patient presents for evaluation of a subjective fever, nasal congestion, , sore throat, headache and wheezing with lying down beginning 1 day ago.  Decreased foods.  Has attempted use of Robitussin and Tylenol .  No known sick contacts prior.  Denies shortness of breath.   Past Medical History:  Diagnosis Date   Abnormal neurological finding suggestive of lumbar-level spinal disorder    Allergy    Anemia    Iron Deficiency   Arthritis    Astigmatism 08/21/2017   Dr. 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 Rocky Leonce   Myopia 08/21/2017   Dr. Rocky Leonce - [facility]   Obesity    Obstructive sleep apnea    no CPAP   Open-angle glaucoma of both eyes, mild stage 08/21/2017   Dr. Rocky Leonce - [facility]   Plantar fasciitis    Pre-diabetes    Presbyopia 07/02/219   Dr. 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   Flat foot 01/28/2016   Aortic sclerosis 10/14/2015   Midline low back pain without sciatica 09/20/2015   Leukocytosis 11/29/2014    Osteoarthritis of left knee 11/20/2014   History of ventral hernia repair 10/27/2014   Allergic rhinitis 08/17/2014   Axillary hidradenitis suppurativa 08/17/2014   Baker cyst 08/17/2014   Chronic constipation 08/17/2014   Major depression in remission 08/17/2014   Engages in travel abroad 08/17/2014   Fibromyalgia 08/17/2014   Cardiac murmur 08/17/2014   Dysmetabolic syndrome 08/17/2014   Plantar fasciitis 08/17/2014   Beat, premature ventricular 08/17/2014   Abnormal neurological finding suggestive of lumbar-level spinal disorder 08/17/2014   Obstructive apnea 12/02/2013   Essential (primary) hypertension 12/09/2009   Hypertrichosis 12/09/2009    Past Surgical History:  Procedure Laterality Date   ACHILLES TENDON SURGERY Left 08/15/2019   Procedure: TRIPLE ARTHRODESIS AND ACHILLES TENDON LENGTHENING LEFT FOOT;  Surgeon: Ashley Soulier, DPM;  Location: [facility];  Service: Podiatry;  Laterality: Left;   COLONOSCOPY WITH PROPOFOL  N/A 07/23/2020   Procedure: COLONOSCOPY WITH PROPOFOL ;  Surgeon: Janalyn Keene NOVAK, MD;  Location: [facility];  Service: Endoscopy;  Laterality: N/A;   FOOT ARTHRODESIS Right 12/01/2016   Procedure: ARTHRODESIS FOOT; TRIPLE;  Surgeon: Ashley Soulier, DPM;  Location: [facility];  Service: Podiatry;  Laterality: Right;   HERNIA REPAIR  10/09/11   Ventral Incarcerated- Dr. Lorrene   OOPHORECTOMY Left 12/09/2009   TUBAL  LIGATION  1993   VENTRAL HERNIA REPAIR  10/09/2011    OB History     Gravida  3   Para  3   Term      Preterm      AB      Living         SAB      IAB      Ectopic      Multiple      Live Births               Home Medications    Prior to Admission medications   Medication Sig Start Date End Date Taking? Authorizing Provider  guaiFENesin-codeine 100-10 MG/5ML syrup Take 5 mLs by mouth every 6 (six) hours as needed for cough. 11/27/23  Yes Arrin Pintor R, NP  acetaminophen  (TYLENOL ) 500 MG tablet Take 1 tablet  (500 mg total) by mouth every 6 (six) hours as needed. Max of 3 grams daily or 6 pills dialy Patient taking differently: Take 1,000 mg by mouth every 6 (six) hours as needed for moderate pain (pain score 4-6) or headache. 06/18/15   Sowles, Krichna, MD  albuterol  (VENTOLIN  HFA) 108 (90 Base) MCG/ACT inhaler Inhale 2 puffs into the lungs every 6 (six) hours as needed for wheezing or shortness of breath. 11/27/23   Clanton Emanuelson, Shelba SAUNDERS, NP  Armodafinil  (NUVIGIL ) 150 MG tablet Take 1 tablet (150 mg total) by mouth daily as needed (shift work--sleepiness). 10/19/23   Sowles, Krichna, MD  baclofen  (LIORESAL ) 10 MG tablet Take 1 tablet (10 mg total) by mouth 3 (three) times daily. 10/19/23   Sowles, Krichna, MD  buPROPion  (WELLBUTRIN  XL) 300 MG 24 hr tablet Take 1 tablet (300 mg total) by mouth daily. 07/13/23   Sowles, Krichna, MD  cetirizine (ZYRTEC) 10 MG tablet Take 10 mg by mouth every evening.    [provider]  Cholecalciferol  (VITAMIN D ) 2000 UNITS tablet Take 2,000 Units by mouth every evening.     [provider]  docusate sodium  (COLACE) 100 MG capsule Take 100 mg by mouth daily as needed for moderate constipation.     [provider]  doxycycline  (VIBRA -TABS) 100 MG tablet Take 1 tablet (100 mg total) by mouth 2 (two) times daily. 11/07/23   Sowles, Krichna, MD  DULoxetine  (CYMBALTA ) 30 MG capsule Take 1 capsule (30 mg total) by mouth daily. 10/19/23   Sowles, Krichna, MD  DULoxetine  (CYMBALTA ) 60 MG capsule Take 1 capsule (60 mg total) by mouth daily. 07/13/23   Sowles, Krichna, MD  ferrous sulfate  325 (65 FE) MG tablet Take 325 mg by mouth every evening.     [provider]  fluticasone  (FLONASE ) 50 MCG/ACT nasal spray Place 2 sprays into both nostrils daily as needed for allergies. 11/03/23   Reyes, Shaylo, PA-C  metFORMIN  (GLUCOPHAGE -XR) 500 MG 24 hr tablet Take 1 tablet (500 mg total) by mouth daily with breakfast. 10/19/23   Sowles, Krichna, MD  Multiple Vitamin  (MULTIVITAMIN WITH MINERALS) TABS tablet Take 1 tablet by mouth every evening.    [provider]  Nebivolol  HCl 20 MG TABS Take 1 tablet (20 mg total) by mouth daily. 07/13/23   Sowles, Krichna, MD  olmesartan  (BENICAR ) 40 MG tablet Take 1 tablet (40 mg total) by mouth daily. 07/13/23   Sowles, Krichna, MD  predniSONE  (STERAPRED UNI-PAK 21 TAB) 10 MG (21) TBPK tablet Take following package directions. 11/03/23   Kingston Robes, PA-C  Travoprost , BAK Free, (TRAVATAN ) 0.004 %  SOLN ophthalmic solution Place 1 drop into both eyes at bedtime. 12/12/22     Travoprost , BAK Free, (TRAVATAN ) 0.004 % SOLN ophthalmic solution Place 1 drop into both eyes at bedtime. 12/13/22     Travoprost , BAK Free, (TRAVATAN ) 0.004 % SOLN ophthalmic solution Place 1 drop into both eyes at bedtime. 10/19/23       Family History Family History  Problem Relation Age of Onset   Hypertension Mother    CVA Mother    Kidney disease Mother    Congenital heart disease Mother    Heart disease Father    Hypertension Father    Diabetes Father    Breast cancer Paternal Grandmother        Bilateral   Colon cancer Maternal Grandfather    Colon cancer Maternal Uncle     Social History Social History   Tobacco Use   Smoking status: Never   Smokeless tobacco: Never  Vaping Use   Vaping status: Never Used  Substance Use Topics   Alcohol use: No    Alcohol/week: 0.0 standard drinks of alcohol   Drug use: No     Allergies   Lisinopril, Norvasc  [amlodipine  besylate], and Hctz [hydrochlorothiazide]   Review of Systems Review of Systems  Respiratory:  Positive for cough.   Neurological:  Positive for headaches.     Physical Exam Triage Vital Signs ED Triage Vitals  Encounter Vitals Group     BP 11/27/23 1708 135/76     Girls Systolic BP Percentile --      Girls Diastolic BP Percentile --      Boys Systolic BP Percentile --      Boys Diastolic BP Percentile --      Pulse Rate 11/27/23 1708 75     Resp  11/27/23 1708 20     Temp 11/27/23 1708 (!) 101.4 F (38.6 C)     Temp Source 11/27/23 1708 Oral     SpO2 11/27/23 1708 94 %     Weight --      Height --      Head Circumference --      Peak Flow --      Pain Score 11/27/23 1702 6     Pain Loc --      Pain Education --      Exclude from Growth Chart --    No data found.  Updated Vital Signs BP 135/76 (BP Location: Right Arm)   Pulse 75   Temp (!) 101.4 F (38.6 C) (Oral)   Resp 20   SpO2 94%   Visual Acuity Right Eye Distance:   Left Eye Distance:   Bilateral Distance:    Right Eye Near:   Left Eye Near:    Bilateral Near:     Physical Exam Constitutional:      Appearance: Normal appearance.  HENT:     Head: Normocephalic.     Right Ear: Tympanic membrane, ear canal and external ear normal.     Left Ear: Tympanic membrane, ear canal and external ear normal.     Nose: Congestion present.     Mouth/Throat:     Mouth: Mucous membranes are moist.     Pharynx: Oropharynx is clear.  Eyes:     Extraocular Movements: Extraocular movements intact.  Cardiovascular:     Rate and Rhythm: Normal rate and regular rhythm.     Pulses: Normal pulses.     Heart sounds: Normal heart sounds.  Pulmonary:     Effort:  Pulmonary effort is normal.     Breath sounds: Normal breath sounds.  Musculoskeletal:     Cervical back: Normal range of motion and neck supple.  Neurological:     Mental Status: She is alert and oriented to person, place, and time. Mental status is at baseline.      UC Treatments / Results  Labs (all labs ordered are listed, but only abnormal results are displayed) Labs Reviewed  POC SOFIA SARS ANTIGEN FIA    EKG   Radiology No results found.  Procedures Procedures (including critical care time)  Medications Ordered in UC Medications  acetaminophen  (TYLENOL ) tablet 650 mg (650 mg Oral Given 11/27/23 1718)    Initial Impression / Assessment and Plan / UC Course  I have reviewed the triage  vital signs and the nursing notes.  Pertinent labs & imaging results that were available during my care of the patient were reviewed by me and considered in my medical decision making (see chart for details).  Viral illness   Fever of 101.4 noted in triage, Tylenol  given.  No further signs of sepsis.  Stable.  Remaining vital signs are within normal range low suspicion for pneumonia, pneumothorax or bronchitis and therefore will defer imaging.  COVID testing negative.  Prescribed albuterol  and guaifenesin codeine, PDMP reviewed, low risk.  Declined prednisone  course.May use additional over-the-counter medications as needed for supportive care.  May follow-up with urgent care as needed if symptoms persist or worsen.  Note given.     Final Clinical Impressions(s) / UC Diagnoses   Final diagnoses:  Viral illness     Discharge Instructions      Your symptoms today are most likely being caused by a virus and should steadily improve in time it can take up to 7 to 10 days before you truly start to see a turnaround however things will get better  May use inhaler 6 hours as needed for wheezing or shortness of breath, take dose before bed if wheezing is primarily at nighttime  May use cough syrup every 6 hours as needed for comfort but please be mindful this can make dry    You can take Tylenol  and/or Ibuprofen as needed for fever reduction and pain relief.   For cough: honey 1/2 to 1 teaspoon (you can dilute the honey in water or another fluid).  You can also use guaifenesin and dextromethorphan for cough. You can use a humidifier for chest congestion and cough.  If you don't have a humidifier, you can sit in the bathroom with the hot shower running.      For sore throat: try warm salt water gargles, cepacol lozenges, throat spray, warm tea or water with lemon/honey, popsicles or ice, or OTC cold relief medicine for throat discomfort.   For congestion: take a daily anti-histamine like  Zyrtec, Claritin , and a oral decongestant, such as pseudoephedrine.  You can also use Flonase  1-2 sprays in each nostril daily.   It is important to stay hydrated: drink plenty of fluids (water, gatorade/powerade/pedialyte, juices, or teas) to keep your throat moisturized and help further relieve irritation/discomfort.    ED Prescriptions     Medication Sig Dispense Auth. Provider   albuterol  (VENTOLIN  HFA) 108 (90 Base) MCG/ACT inhaler Inhale 2 puffs into the lungs every 6 (six) hours as needed for wheezing or shortness of breath. 18 g Jakhia Buxton R, NP   guaiFENesin-codeine 100-10 MG/5ML syrup Take 5 mLs by mouth every 6 (six) hours as needed for cough. 120  mL Teresa Shelba SAUNDERS, NP      I have reviewed the PDMP during this encounter.   Teresa Shelba SAUNDERS, NP 11/27/23 1944

## 2023-11-28 ENCOUNTER — Encounter: Payer: Self-pay | Admitting: Family Medicine

## 2023-11-28 ENCOUNTER — Ambulatory Visit: Admitting: Family Medicine

## 2023-11-28 VITALS — BP 138/84 | HR 87 | Temp 98.7°F | Resp 18 | Ht 62.0 in | Wt 218.3 lb

## 2023-11-28 DIAGNOSIS — J208 Acute bronchitis due to other specified organisms: Secondary | ICD-10-CM | POA: Diagnosis not present

## 2023-11-28 DIAGNOSIS — R509 Fever, unspecified: Secondary | ICD-10-CM | POA: Diagnosis not present

## 2023-11-28 LAB — POC COVID19/FLU A&B COMBO
Covid Antigen, POC: NEGATIVE
Influenza A Antigen, POC: NEGATIVE
Influenza B Antigen, POC: NEGATIVE

## 2023-11-28 MED ORDER — TRELEGY ELLIPTA 200-62.5-25 MCG/ACT IN AEPB: 1.0000 | INHALATION_SPRAY | Freq: Every day | RESPIRATORY_TRACT | Status: AC

## 2023-11-28 NOTE — Progress Notes (Signed)
 Name: Kathleen Conley   MRN: [id number]    DOB: 03-31-1970   Date:11/28/2023       Progress Note  Subjective  Chief Complaint  Chief Complaint  Patient presents with   URI    Fever, cough, congestion and headache.  Onset 3 days went urgent care yesterday tested neg for covid given cough syrup and albuterol .  Pt thinks has sinus infection   Discussed the use of AI scribe software for clinical note transcription with the patient, who gave verbal consent to proceed.  History of Present Illness Kathleen Conley is a 53 year old female who presents with symptoms of a viral upper respiratory infection.  Symptoms began two days ago with a severe headache, cough, and sore throat. She was unaware of having a fever until visiting urgent care, where her temperature was recorded at 101.30F. Tylenol  was administered, and she took it again this morning at 9 AM.  A COVID-19 test conducted at urgent care returned negative. She was prescribed guaifenesin with codeine and an albuterol  inhaler, which she has been using every four hours as needed. She describes coughing up yellow mucus and experiencing sinus pressure and pain.  Her oxygen saturation was noted to be 93%, lower than her usual 97-98%. She experiences shortness of breath with exertion but has no history of asthma.  She has been off work since Monday due to her symptoms and fever, planning to return on Friday if her condition improves. No fever today after taking Tylenol . Shortness of breath only with exertion.    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   Flat foot 01/28/2016   Aortic sclerosis 10/14/2015   Midline low back pain without sciatica  09/20/2015   Leukocytosis 11/29/2014   Osteoarthritis of left knee 11/20/2014   History of ventral hernia repair 10/27/2014   Allergic rhinitis 08/17/2014   Axillary hidradenitis suppurativa 08/17/2014   Baker cyst 08/17/2014   Chronic constipation 08/17/2014   Major depression in remission 08/17/2014   Engages in travel abroad 08/17/2014   Fibromyalgia 08/17/2014   Cardiac murmur 08/17/2014   Dysmetabolic syndrome 08/17/2014   Plantar fasciitis 08/17/2014   Beat, premature ventricular 08/17/2014   Abnormal neurological finding suggestive of lumbar-level spinal disorder 08/17/2014   Obstructive apnea 12/02/2013   Essential (primary) hypertension 12/09/2009   Hypertrichosis 12/09/2009    Past Surgical History:  Procedure Laterality Date   ACHILLES TENDON SURGERY Left 08/15/2019   Procedure: TRIPLE ARTHRODESIS AND ACHILLES TENDON LENGTHENING LEFT FOOT;  Surgeon: Ashley Soulier, DPM;  Location: [facility];  Service: Podiatry;  Laterality: Left;   COLONOSCOPY WITH PROPOFOL  N/A 07/23/2020   Procedure: COLONOSCOPY WITH PROPOFOL ;  Surgeon: Janalyn Keene NOVAK, MD;  Location: [facility];  Service: Endoscopy;  Laterality: N/A;   FOOT ARTHRODESIS Right 12/01/2016   Procedure: ARTHRODESIS FOOT; TRIPLE;  Surgeon: Ashley Soulier, DPM;  Location: [facility];  Service: Podiatry;  Laterality: Right;   HERNIA REPAIR  10/09/11   Ventral Incarcerated- Dr. Lorrene   OOPHORECTOMY Left 12/09/2009   TUBAL LIGATION  1993   VENTRAL HERNIA REPAIR  10/09/2011    Family History  Problem Relation Age of Onset   Hypertension Mother    CVA Mother  Kidney disease Mother    Congenital heart disease Mother    Heart disease Father    Hypertension Father    Diabetes Father    Breast cancer Paternal Grandmother        Bilateral   Colon cancer Maternal Grandfather    Colon cancer Maternal Uncle     Social History   Tobacco Use   Smoking status: Never   Smokeless tobacco: Never  Substance Use Topics    Alcohol use: No    Alcohol/week: 0.0 standard drinks of alcohol     Current Outpatient Medications:    acetaminophen  (TYLENOL ) 500 MG tablet, Take 1 tablet (500 mg total) by mouth every 6 (six) hours as needed. Max of 3 grams daily or 6 pills dialy (Patient taking differently: Take 1,000 mg by mouth every 6 (six) hours as needed for moderate pain (pain score 4-6) or headache.), Disp: 30 tablet, Rfl: 0   albuterol  (VENTOLIN  HFA) 108 (90 Base) MCG/ACT inhaler, Inhale 2 puffs into the lungs every 6 (six) hours as needed for wheezing or shortness of breath., Disp: 18 g, Rfl: 0   Armodafinil  (NUVIGIL ) 150 MG tablet, Take 1 tablet (150 mg total) by mouth daily as needed (shift work--sleepiness)., Disp: 90 tablet, Rfl: 0   baclofen  (LIORESAL ) 10 MG tablet, Take 1 tablet (10 mg total) by mouth 3 (three) times daily., Disp: 90 each, Rfl: 0   buPROPion  (WELLBUTRIN  XL) 300 MG 24 hr tablet, Take 1 tablet (300 mg total) by mouth daily., Disp: 90 tablet, Rfl: 1   cetirizine (ZYRTEC) 10 MG tablet, Take 10 mg by mouth every evening., Disp: , Rfl:    Cholecalciferol  (VITAMIN D ) 2000 UNITS tablet, Take 2,000 Units by mouth every evening. , Disp: , Rfl:    docusate sodium  (COLACE) 100 MG capsule, Take 100 mg by mouth daily as needed for moderate constipation. , Disp: , Rfl:    DULoxetine  (CYMBALTA ) 30 MG capsule, Take 1 capsule (30 mg total) by mouth daily., Disp: 90 capsule, Rfl: 0   DULoxetine  (CYMBALTA ) 60 MG capsule, Take 1 capsule (60 mg total) by mouth daily., Disp: 90 capsule, Rfl: 1   ferrous sulfate  325 (65 FE) MG tablet, Take 325 mg by mouth every evening. , Disp: , Rfl:    fluticasone  (FLONASE ) 50 MCG/ACT nasal spray, Place 2 sprays into both nostrils daily as needed for allergies., Disp: 48 g, Rfl: 0   guaiFENesin-codeine 100-10 MG/5ML syrup, Take 5 mLs by mouth every 6 (six) hours as needed for cough., Disp: 120 mL, Rfl: 0   metFORMIN  (GLUCOPHAGE -XR) 500 MG 24 hr tablet, Take 1 tablet (500 mg total) by  mouth daily with breakfast., Disp: 90 tablet, Rfl: 0   Multiple Vitamin (MULTIVITAMIN WITH MINERALS) TABS tablet, Take 1 tablet by mouth every evening., Disp: , Rfl:    Nebivolol  HCl 20 MG TABS, Take 1 tablet (20 mg total) by mouth daily., Disp: 90 tablet, Rfl: 1   olmesartan  (BENICAR ) 40 MG tablet, Take 1 tablet (40 mg total) by mouth daily., Disp: 90 tablet, Rfl: 1   Travoprost , BAK Free, (TRAVATAN ) 0.004 % SOLN ophthalmic solution, Place 1 drop into both eyes at bedtime., Disp: 7.5 mL, Rfl: 2   Travoprost , BAK Free, (TRAVATAN ) 0.004 % SOLN ophthalmic solution, Place 1 drop into both eyes at bedtime., Disp: 7.5 mL, Rfl: 0   Travoprost , BAK Free, (TRAVATAN ) 0.004 % SOLN ophthalmic solution, Place 1 drop into both eyes at bedtime., Disp: 7.5 mL, Rfl: 3  Allergies  Allergen Reactions   Lisinopril Cough        Norvasc  [Amlodipine  Besylate]    Hctz [Hydrochlorothiazide] Other (See Comments)    Burning sensation on her feet     I personally reviewed active problem list, medication list, allergies with the patient/caregiver today.   ROS  Ten systems reviewed and is negative except as mentioned in HPI    Objective Physical Exam VITALS: SaO2- 93% CONSTITUTIONAL: Patient appears well-developed and well-nourished. No distress. HEENT: Head atraumatic, normocephalic, neck supple. Throat normal. Sinuses tender. Ears normal. CARDIOVASCULAR: Normal rate, regular rhythm and normal heart sounds. No murmur heard. No BLE edema. PULMONARY: Effort normal. Lungs wheezing anteriorly. No respiratory distress. PSYCHIATRIC: Patient has a normal mood and affect. Behavior is normal. Judgment and thought content normal.  Vitals:   11/28/23 1444  BP: (!) 162/96  Pulse: 87  Resp: 18  Temp: 98.7 F (37.1 C)  SpO2: 93%  Weight: 218 lb 4.8 oz (99 kg)  Height: 5' 2 (1.575 m)    Body mass index is 39.93 kg/m.  Recent Results (from the past 2160 hours)  CBC with Differential/Platelet     Status:  Abnormal   Collection Time: 10/19/23  2:41 PM  Result Value Ref Range   WBC 11.6 (H) 3.8 - 10.8 Thousand/uL   RBC 4.35 3.80 - 5.10 Million/uL   Hemoglobin 12.3 11.7 - 15.5 g/dL   HCT 62.3 64.9 - 54.9 %   MCV 86.4 80.0 - 100.0 fL   MCH 28.3 27.0 - 33.0 pg   MCHC 32.7 32.0 - 36.0 g/dL    Comment: For adults, a slight decrease in the calculated MCHC value (in the range of 30 to 32 g/dL) is most likely not clinically significant; however, it should be interpreted with caution in correlation with other red cell parameters and the patient's clinical condition.    RDW 14.4 11.0 - 15.0 %   Platelets 298 140 - 400 Thousand/uL   MPV 9.5 7.5 - 12.5 fL   Neutro Abs 7,343 1,500 - 7,800 cells/uL   Absolute Lymphocytes 2,935 850 - 3,900 cells/uL   Absolute Monocytes 1,009 (H) 200 - 950 cells/uL   Eosinophils Absolute 267 15 - 500 cells/uL   Basophils Absolute 46 0 - 200 cells/uL   Neutrophils Relative % 63.3 %   Total Lymphocyte 25.3 %   Monocytes Relative 8.7 %   Eosinophils Relative 2.3 %   Basophils Relative 0.4 %  Lipid panel     Status: Abnormal   Collection Time: 10/19/23  2:41 PM  Result Value Ref Range   Cholesterol 197 <200 mg/dL   HDL 42 (L) > OR = 50 mg/dL   Triglycerides 803 (H) <150 mg/dL   LDL Cholesterol (Calc) 123 (H) mg/dL (calc)    Comment: Reference range: <100 . Desirable range <100 mg/dL for primary prevention;   <70 mg/dL for patients with CHD or diabetic patients  with > or = 2 CHD risk factors. SABRA LDL-C is now calculated using the Martin-Hopkins  calculation, which is a validated novel method providing  better accuracy than the Friedewald equation in the  estimation of LDL-C.  Gladis APPLETHWAITE et al. SANDREA. 7986;689(80): 2061-2068  (http://education.[facility].com/faq/FAQ164)    Total CHOL/HDL Ratio 4.7 <5.0 (calc)   Non-HDL Cholesterol (Calc) 155 (H) <130 mg/dL (calc)    Comment: For patients with diabetes plus 1 major ASCVD risk  factor, treating to a  non-HDL-C goal of <100 mg/dL  (LDL-C of <29 mg/dL) is considered a therapeutic  option.   Hemoglobin A1c     Status: Abnormal   Collection Time: 10/19/23  2:41 PM  Result Value Ref Range   Hgb A1c MFr Bld 6.0 (H) <5.7 %    Comment: For someone without known diabetes, a hemoglobin  A1c value between 5.7% and 6.4% is consistent with prediabetes and should be confirmed with a  follow-up test. . For someone with known diabetes, a value <7% indicates that their diabetes is well controlled. A1c targets should be individualized based on duration of diabetes, age, comorbid conditions, and other considerations. . This assay result is consistent with an increased risk of diabetes. . Currently, no consensus exists regarding use of hemoglobin A1c for diagnosis of diabetes for children. .    Mean Plasma Glucose 126 mg/dL   eAG (mmol/L) 7.0 mmol/L  Iron, TIBC and Ferritin Panel     Status: Abnormal   Collection Time: 10/19/23  2:41 PM  Result Value Ref Range   Iron 40 (L) 45 - 160 mcg/dL   TIBC 672 749 - 549 mcg/dL (calc)   %SAT 12 (L) 16 - 45 % (calc)   Ferritin 220 16 - 232 ng/mL  Comprehensive metabolic panel with GFR     Status: None   Collection Time: 10/19/23  2:41 PM  Result Value Ref Range   Glucose, Bld 67 65 - 99 mg/dL    Comment: .            Fasting reference interval .    BUN 12 7 - 25 mg/dL   Creat 9.33 9.49 - 8.96 mg/dL   eGFR 895 > OR = 60 fO/fpw/8.26f7   BUN/Creatinine Ratio SEE NOTE: 6 - 22 (calc)    Comment:    Not Reported: BUN and Creatinine are within    reference range. .    Sodium 144 135 - 146 mmol/L   Potassium 4.0 3.5 - 5.3 mmol/L   Chloride 105 98 - 110 mmol/L   CO2 30 20 - 32 mmol/L   Calcium 9.7 8.6 - 10.4 mg/dL   Total Protein 6.5 6.1 - 8.1 g/dL   Albumin 4.1 3.6 - 5.1 g/dL   Globulin 2.4 1.9 - 3.7 g/dL (calc)   AG Ratio 1.7 1.0 - 2.5 (calc)   Total Bilirubin 0.3 0.2 - 1.2 mg/dL   Alkaline phosphatase (APISO) 69 37 - 153 U/L   AST 15 10 -  35 U/L   ALT 15 6 - 29 U/L  Hepatitis B Surface AntiBODY     Status: Abnormal   Collection Time: 10/19/23  2:41 PM  Result Value Ref Range   Hep B S Ab REACTIVE (A) NON-REACTIVE  POCT rapid strep A     Status: None   Collection Time: 11/07/23  1:19 PM  Result Value Ref Range   Rapid Strep A Screen Negative Negative  POC SARS Coronavirus 2 Ag     Status: None   Collection Time: 11/27/23  5:32 PM  Result Value Ref Range   SARS Coronavirus 2 Ag Negative Negative  POC Covid19/Flu A&B Antigen     Status: None   Collection Time: 11/28/23  2:59 PM  Result Value Ref Range   Influenza A Antigen, POC Negative Negative   Influenza B Antigen, POC Negative Negative   Covid Antigen, POC Negative Negative    Diabetic Foot Exam:     PHQ2/9:    11/07/2023    1:00 PM 10/19/2023    1:49 PM 07/13/2023   10:56 AM 03/22/2023  9:21 AM 12/21/2022    9:17 AM  Depression screen PHQ 2/9  Decreased Interest 0 0 0 3 0  Down, Depressed, Hopeless 0 0 0 3 0  PHQ - 2 Score 0 0 0 6 0  Altered sleeping 0 0 0 3 0  Tired, decreased energy 0 0 0 3 0  Change in appetite 0 0 0 3 0  Feeling bad or failure about yourself  0 0 0 0 0  Trouble concentrating 0 0 0 3 0  Moving slowly or fidgety/restless 0 0 0 0 0  Suicidal thoughts 0 0 0 0 0  PHQ-9 Score 0 0 0 18 0  Difficult doing work/chores Not difficult at all Not difficult at all Not difficult at all Very difficult     phq 9 is negative  Fall Risk:    11/07/2023    1:00 PM 10/19/2023    1:49 PM 07/13/2023   10:46 AM 03/22/2023    9:21 AM 12/21/2022    9:17 AM  Fall Risk   Falls in the past year? 0 0 1 0 1  Number falls in past yr: 0 0 0 0 0  Injury with Fall? 0 0 0 0 0  Risk for fall due to : No Fall Risks No Fall Risks Impaired balance/gait No Fall Risks History of fall(s)  Follow up Falls evaluation completed Falls evaluation completed Falls prevention discussed;Education provided;Falls evaluation completed Falls prevention discussed;Education  provided;Falls evaluation completed Falls prevention discussed;Education provided;Falls evaluation completed     Assessment & Plan Acute viral bronchitis with cough and wheezing Acute viral bronchitis with cough, wheezing, and decreased oxygen saturation to 93% on exertion. Viral etiology suggested by high fever and negative COVID test. - Continue albuterol  inhaler. - Provide Trelegy sample for daily use.200 mg dose - Instruct to use saline spray for nasal congestion. - Advise to report if symptoms worsen.

## 2023-12-03 ENCOUNTER — Encounter: Payer: Self-pay | Admitting: Urology

## 2023-12-03 ENCOUNTER — Ambulatory Visit (INDEPENDENT_AMBULATORY_CARE_PROVIDER_SITE_OTHER): Admitting: Urology

## 2023-12-03 VITALS — BP 174/92 | HR 71 | Ht 62.0 in | Wt 221.6 lb

## 2023-12-03 DIAGNOSIS — N39490 Overflow incontinence: Secondary | ICD-10-CM | POA: Diagnosis not present

## 2023-12-03 DIAGNOSIS — N3946 Mixed incontinence: Secondary | ICD-10-CM | POA: Diagnosis not present

## 2023-12-03 NOTE — Progress Notes (Signed)
 12/03/2023 8:47 AM   Kathleen Conley 1970/11/28 [id number]  Referring provider: Sowles, Krichna, MD [address] [place],  [place] [address]  No chief complaint on file.   HPI: I was consulted to assess the patient's urinary incontinence.  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  She leaks with coughing sneezing bending lifting.  She has urge incontinence.  Both are significant.  She has mild bedwetting.  She voids every 2 or 3 hours and gets up twice at night.  Her flow was good but sometimes she sits longer and double voids a larger amount.  She has not had a hysterectomy  No history of kidney stones bladder surgery or bladder infections.  No neurologic issues.  Bowel function normal   PMH: Past Medical History:  Diagnosis Date   Abnormal neurological finding suggestive of lumbar-level spinal disorder    Allergy    Anemia    Iron Deficiency   Arthritis    Astigmatism 08/21/2017   Dr. 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 Rocky Leonce   Myopia 08/21/2017   Dr. Rocky Leonce - [facility]   Obesity    Obstructive sleep apnea    no CPAP   Open-angle glaucoma of both eyes, mild stage 08/21/2017   Dr. Rocky Leonce - [facility]   Plantar fasciitis    Pre-diabetes    Presbyopia 07/02/219   Dr. Rocky Leonce   PVC (premature ventricular contraction)     Surgical History: Past Surgical History:  Procedure Laterality Date   ACHILLES TENDON SURGERY Left 08/15/2019   Procedure: TRIPLE ARTHRODESIS AND ACHILLES TENDON LENGTHENING LEFT FOOT;  Surgeon: Ashley Soulier, DPM;  Location: [facility];  Service: Podiatry;  Laterality: Left;   COLONOSCOPY WITH PROPOFOL  N/A 07/23/2020   Procedure:  COLONOSCOPY WITH PROPOFOL ;  Surgeon: Janalyn Keene NOVAK, MD;  Location: [facility];  Service: Endoscopy;  Laterality: N/A;   FOOT ARTHRODESIS Right 12/01/2016   Procedure: ARTHRODESIS FOOT; TRIPLE;  Surgeon: Ashley Soulier, DPM;  Location: [facility];  Service: Podiatry;  Laterality: Right;   HERNIA REPAIR  10/09/11   Ventral Incarcerated- Dr. Lorrene   OOPHORECTOMY Left 12/09/2009   TUBAL LIGATION  1993   VENTRAL HERNIA REPAIR  10/09/2011    Home Medications:  Allergies as of 12/03/2023       Reactions   Lisinopril Cough      Norvasc  [amlodipine  Besylate]    Hctz [hydrochlorothiazide] Other (See Comments)   Burning sensation on her feet         Medication List        Accurate as of December 03, 2023  8:47 AM. If you have any questions, ask your nurse or doctor.          acetaminophen  500 MG tablet Commonly known as: TYLENOL  Take 1 tablet (500 mg total) by mouth every 6 (six) hours as needed. Max of 3 grams daily or 6 pills dialy What changed:  how much to take reasons to take this additional instructions   albuterol  108 (90 Base) MCG/ACT inhaler Commonly known as: VENTOLIN  HFA Inhale 2 puffs into the lungs every 6 (six) hours as needed  for wheezing or shortness of breath.   Armodafinil  150 MG tablet Commonly known as: Nuvigil  Take 1 tablet (150 mg total) by mouth daily as needed (shift work--sleepiness).   baclofen  10 MG tablet Commonly known as: LIORESAL  Take 1 tablet (10 mg total) by mouth 3 (three) times daily.   buPROPion  300 MG 24 hr tablet Commonly known as: WELLBUTRIN  XL Take 1 tablet (300 mg total) by mouth daily.   cetirizine 10 MG tablet Commonly known as: ZYRTEC Take 10 mg by mouth every evening.   docusate sodium  100 MG capsule Commonly known as: COLACE Take 100 mg by mouth daily as needed for moderate constipation.   DULoxetine  60 MG capsule Commonly known as: Cymbalta  Take 1 capsule (60 mg total) by mouth daily.   DULoxetine  30 MG  capsule Commonly known as: CYMBALTA  Take 1 capsule (30 mg total) by mouth daily.   ferrous sulfate  325 (65 FE) MG tablet Take 325 mg by mouth every evening.   fluticasone  50 MCG/ACT nasal spray Commonly known as: FLONASE  Place 2 sprays into both nostrils daily as needed for allergies.   guaiFENesin-codeine 100-10 MG/5ML syrup Take 5 mLs by mouth every 6 (six) hours as needed for cough.   metFORMIN  500 MG 24 hr tablet Commonly known as: GLUCOPHAGE -XR Take 1 tablet (500 mg total) by mouth daily with breakfast.   multivitamin with minerals Tabs tablet Take 1 tablet by mouth every evening.   Nebivolol  HCl 20 MG Tabs Take 1 tablet (20 mg total) by mouth daily.   olmesartan  40 MG tablet Commonly known as: Benicar  Take 1 tablet (40 mg total) by mouth daily.   Travoprost  (BAK Free) 0.004 % Soln ophthalmic solution Commonly known as: TRAVATAN  Place 1 drop into both eyes at bedtime.   Travoprost  (BAK Free) 0.004 % Soln ophthalmic solution Commonly known as: TRAVATAN  Place 1 drop into both eyes at bedtime.   Travoprost  (BAK Free) 0.004 % Soln ophthalmic solution Commonly known as: TRAVATAN  Place 1 drop into both eyes at bedtime.   Trelegy Ellipta  200-62.5-25 MCG/ACT Aepb Generic drug: Fluticasone -Umeclidin-Vilant Inhale 1 puff into the lungs daily.   Vitamin D  50 MCG (2000 UT) tablet Take 2,000 Units by mouth every evening.        Allergies:  Allergies  Allergen Reactions   Lisinopril Cough        Norvasc  [Amlodipine  Besylate]    Hctz [Hydrochlorothiazide] Other (See Comments)    Burning sensation on her feet     Family History: Family History  Problem Relation Age of Onset   Hypertension Mother    CVA Mother    Kidney disease Mother    Congenital heart disease Mother    Heart disease Father    Hypertension Father    Diabetes Father    Breast cancer Paternal Grandmother        Bilateral   Colon cancer Maternal Grandfather    Colon cancer Maternal Uncle      Social History:  reports that she has never smoked. She has never used smokeless tobacco. She reports that she does not drink alcohol and does not use drugs.  ROS:                                        Physical Exam: There were no vitals taken for this visit.  Constitutional:  Alert and oriented, No acute distress. HEENT: [place] AT, moist mucus  membranes.  Trachea midline, no masses. Cardiovascular: No clubbing, cyanosis, or edema. Respiratory: Normal respiratory effort, no increased work of breathing. GI: Abdomen is soft, nontender, nondistended, no abdominal masses GU: On pelvic examination patient had mild hypermobility of the bladder neck and a mild positive cough test.  Exam was a little bit limited and she appeared to have a small rectocele and there may have been stool in the rectum.  No significant central defect.  Good vaginal length Skin: No rashes, bruises or suspicious lesions. Lymph: No cervical or inguinal adenopathy. Neurologic: Grossly intact, no focal deficits, moving all 4 extremities. Psychiatric: Normal mood and affect.  Laboratory Data: Lab Results  Component Value Date   WBC 11.6 (H) 10/19/2023   HGB 12.3 10/19/2023   HCT 37.6 10/19/2023   MCV 86.4 10/19/2023   PLT 298 10/19/2023    Lab Results  Component Value Date   CREATININE 0.66 10/19/2023    No results found for: PSA  No results found for: TESTOSTERONE  Lab Results  Component Value Date   HGBA1C 6.0 (H) 10/19/2023    Urinalysis    Component Value Date/Time   COLORURINE YELLOW (A) 09/21/2020 2125   APPEARANCEUR CLOUDY (A) 09/21/2020 2125   APPEARANCEUR Hazy 11/01/2012 1833   LABSPEC 1.019 09/21/2020 2125   LABSPEC 1.016 11/01/2012 1833   PHURINE 6.0 09/21/2020 2125   GLUCOSEU NEGATIVE 09/21/2020 2125   GLUCOSEU Negative 11/01/2012 1833   HGBUR MODERATE (A) 09/21/2020 2125   BILIRUBINUR neg 01/09/2022 1330   BILIRUBINUR Negative 11/01/2012 1833    KETONESUR NEGATIVE 09/21/2020 2125   PROTEINUR Negative 01/09/2022 1330   PROTEINUR 100 (A) 09/21/2020 2125   UROBILINOGEN 0.2 01/09/2022 1330   NITRITE neg 01/09/2022 1330   NITRITE NEGATIVE 09/21/2020 2125   LEUKOCYTESUR Negative 01/09/2022 1330   LEUKOCYTESUR MODERATE (A) 09/21/2020 2125   LEUKOCYTESUR Trace 11/01/2012 1833    Pertinent Imaging: Urine reviewed and sent for culture.  Chart reviewed  Assessment & Plan: Patient has mixed incontinence and mild bedwetting.  She has mild frequency and nocturia.  She will return with urodynamics and cystoscopy and proceed accordingly.  I will reexamine her for prolapse next visit during cystoscopy  1. Overflow incontinence (Primary)  - Urinalysis, Complete   No follow-ups on file.  Glendia DELENA Bianney, MD  [facility] [address] [place], [place] [address] [phone]

## 2023-12-03 NOTE — Patient Instructions (Signed)

## 2023-12-18 ENCOUNTER — Ambulatory Visit: Admitting: Family Medicine

## 2023-12-18 ENCOUNTER — Other Ambulatory Visit: Payer: Self-pay

## 2023-12-18 ENCOUNTER — Encounter: Payer: Self-pay | Admitting: Family Medicine

## 2023-12-18 VITALS — BP 134/84 | HR 66 | Resp 16 | Ht 62.0 in | Wt 221.1 lb

## 2023-12-18 DIAGNOSIS — Z23 Encounter for immunization: Secondary | ICD-10-CM | POA: Diagnosis not present

## 2023-12-18 DIAGNOSIS — R300 Dysuria: Secondary | ICD-10-CM

## 2023-12-18 LAB — POCT URINALYSIS DIPSTICK
Bilirubin, UA: NEGATIVE
Glucose, UA: NEGATIVE
Ketones, UA: NEGATIVE
Nitrite, UA: NEGATIVE
Protein, UA: POSITIVE — AB
Spec Grav, UA: >=1.03 — AB (ref 1.010–1.025)
Urobilinogen, UA: 0.2 U/dL
pH, UA: 6 (ref 5.0–8.0)

## 2023-12-18 MED ORDER — CIPROFLOXACIN HCL 250 MG PO TABS
250.0000 mg | ORAL_TABLET | Freq: Two times a day (BID) | ORAL | 0 refills | Status: AC
  Filled 2023-12-18: qty 6, 3d supply, fill #0

## 2023-12-18 MED ORDER — PHENAZOPYRIDINE HCL 200 MG PO TABS
200.0000 mg | ORAL_TABLET | Freq: Three times a day (TID) | ORAL | 0 refills | Status: DC | PRN
  Filled 2023-12-18: qty 10, 4d supply, fill #0

## 2023-12-18 NOTE — Progress Notes (Signed)
 Name: Kathleen Conley   MRN: [id number]    DOB: 1970-05-01   Date:12/18/2023       Progress Note  Subjective  Chief Complaint  Chief Complaint  Patient presents with   Dysuria    Sx started yesterday    Discussed the use of AI scribe software for clinical note transcription with the patient, who gave verbal consent to proceed.  History of Present Illness Kathleen Conley is a 53 year old female who presents with symptoms of a urinary tract infection.  She has been experiencing dysuria with significant pain during urination, particularly with the last few drops, and a foul odor in her urine. These symptoms began yesterday and worsened overnight.  She has a history of urinary tract infections, although the last antibiotic she took was doxycycline  for a groin abscess in September. She has not taken ciprofloxacin recently.  No visible hematuria, although urinalysis indicated the presence of blood. No back pain, upper back pain, nausea, or vomiting. She takes her blood pressure medication daily at night due to her work schedule. She did not sleep well due to discomfort from her symptoms.    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   Flat foot 01/28/2016   Aortic sclerosis 10/14/2015   Midline low back pain without sciatica 09/20/2015   Leukocytosis 11/29/2014   Osteoarthritis of left knee 11/20/2014   History of ventral hernia repair 10/27/2014   Allergic rhinitis 08/17/2014   Axillary hidradenitis suppurativa 08/17/2014   Baker cyst 08/17/2014   Chronic constipation 08/17/2014   Major depression in remission 08/17/2014   Engages in travel abroad 08/17/2014   Fibromyalgia 08/17/2014    Cardiac murmur 08/17/2014   Dysmetabolic syndrome 08/17/2014   Plantar fasciitis 08/17/2014   Beat, premature ventricular 08/17/2014   Abnormal neurological finding suggestive of lumbar-level spinal disorder 08/17/2014   Obstructive apnea 12/02/2013   Essential (primary) hypertension 12/09/2009   Hypertrichosis 12/09/2009    Social History   Tobacco Use   Smoking status: Never   Smokeless tobacco: Never  Substance Use Topics   Alcohol use: No    Alcohol/week: 0.0 standard drinks of alcohol     Current Outpatient Medications:    acetaminophen  (TYLENOL ) 500 MG tablet, Take 1 tablet (500 mg total) by mouth every 6 (six) hours as needed. Max of 3 grams daily or 6 pills dialy (Patient taking differently: Take 1,000 mg by mouth every 6 (six) hours as needed for moderate pain (pain score 4-6) or headache.), Disp: 30 tablet, Rfl: 0   albuterol  (VENTOLIN  HFA) 108 (90 Base) MCG/ACT inhaler, Inhale 2 puffs into the lungs every 6 (six) hours as needed for wheezing or shortness of breath., Disp: 18 g, Rfl: 0   Armodafinil  (NUVIGIL ) 150 MG tablet, Take 1 tablet (150 mg total) by mouth daily as needed (shift work--sleepiness)., Disp: 90 tablet, Rfl: 0   baclofen  (LIORESAL ) 10 MG tablet, Take 1 tablet (10 mg total) by mouth 3 (three) times daily., Disp: 90 each, Rfl: 0   buPROPion  (WELLBUTRIN  XL) 300 MG 24 hr tablet, Take 1 tablet (300 mg total) by mouth daily., Disp: 90 tablet, Rfl: 1   cetirizine (ZYRTEC) 10 MG tablet, Take 10 mg by  mouth every evening., Disp: , Rfl:    Cholecalciferol  (VITAMIN D ) 2000 UNITS tablet, Take 2,000 Units by mouth every evening. , Disp: , Rfl:    docusate sodium  (COLACE) 100 MG capsule, Take 100 mg by mouth daily as needed for moderate constipation. , Disp: , Rfl:    DULoxetine  (CYMBALTA ) 30 MG capsule, Take 1 capsule (30 mg total) by mouth daily., Disp: 90 capsule, Rfl: 0   DULoxetine  (CYMBALTA ) 60 MG capsule, Take 1 capsule (60 mg total) by mouth daily., Disp: 90  capsule, Rfl: 1   ferrous sulfate  325 (65 FE) MG tablet, Take 325 mg by mouth every evening. , Disp: , Rfl:    fluticasone  (FLONASE ) 50 MCG/ACT nasal spray, Place 2 sprays into both nostrils daily as needed for allergies., Disp: 48 g, Rfl: 0   Fluticasone -Umeclidin-Vilant (TRELEGY ELLIPTA ) 200-62.5-25 MCG/ACT AEPB, Inhale 1 puff into the lungs daily., Disp: , Rfl:    guaiFENesin-codeine 100-10 MG/5ML syrup, Take 5 mLs by mouth every 6 (six) hours as needed for cough., Disp: 120 mL, Rfl: 0   metFORMIN  (GLUCOPHAGE -XR) 500 MG 24 hr tablet, Take 1 tablet (500 mg total) by mouth daily with breakfast., Disp: 90 tablet, Rfl: 0   Multiple Vitamin (MULTIVITAMIN WITH MINERALS) TABS tablet, Take 1 tablet by mouth every evening., Disp: , Rfl:    Nebivolol  HCl 20 MG TABS, Take 1 tablet (20 mg total) by mouth daily., Disp: 90 tablet, Rfl: 1   olmesartan  (BENICAR ) 40 MG tablet, Take 1 tablet (40 mg total) by mouth daily., Disp: 90 tablet, Rfl: 1   Travoprost , BAK Free, (TRAVATAN ) 0.004 % SOLN ophthalmic solution, Place 1 drop into both eyes at bedtime., Disp: 7.5 mL, Rfl: 2   Travoprost , BAK Free, (TRAVATAN ) 0.004 % SOLN ophthalmic solution, Place 1 drop into both eyes at bedtime., Disp: 7.5 mL, Rfl: 0   Travoprost , BAK Free, (TRAVATAN ) 0.004 % SOLN ophthalmic solution, Place 1 drop into both eyes at bedtime., Disp: 7.5 mL, Rfl: 3  Allergies  Allergen Reactions   Lisinopril Cough        Norvasc  [Amlodipine  Besylate]    Hctz [Hydrochlorothiazide] Other (See Comments)    Burning sensation on her feet     ROS  Ten systems reviewed and is negative except as mentioned in HPI    Objective  Vitals:   12/18/23 0801  BP: 136/86  Pulse: 66  Resp: 16  SpO2: 98%  Weight: 221 lb 1.6 oz (100.3 kg)  Height: 5' 2 (1.575 m)    Body mass index is 40.44 kg/m.   Physical Exam VITALS: BP- 136/ CONSTITUTIONAL: Patient appears well-developed and well-nourished. No distress. HEENT: Head atraumatic,  normocephalic, neck supple. CARDIOVASCULAR: Normal rate, regular rhythm and normal heart sounds. Faint murmur at the second intercostal space on the left side. No BLE edema. PULMONARY: Effort normal and breath sounds normal. No respiratory distress. ABDOMINAL: Abdomen non-tender. No distention. No costovertebral angle tenderness. PSYCHIATRIC: Patient has a normal mood and affect. Behavior is normal. Judgment and thought content normal.   Recent Results (from the past 2160 hours)  CBC with Differential/Platelet     Status: Abnormal   Collection Time: 10/19/23  2:41 PM  Result Value Ref Range   WBC 11.6 (H) 3.8 - 10.8 Thousand/uL   RBC 4.35 3.80 - 5.10 Million/uL   Hemoglobin 12.3 11.7 - 15.5 g/dL   HCT 62.3 64.9 - 54.9 %   MCV 86.4 80.0 - 100.0 fL   MCH 28.3 27.0 - 33.0 pg  MCHC 32.7 32.0 - 36.0 g/dL    Comment: For adults, a slight decrease in the calculated MCHC value (in the range of 30 to 32 g/dL) is most likely not clinically significant; however, it should be interpreted with caution in correlation with other red cell parameters and the patient's clinical condition.    RDW 14.4 11.0 - 15.0 %   Platelets 298 140 - 400 Thousand/uL   MPV 9.5 7.5 - 12.5 fL   Neutro Abs 7,343 1,500 - 7,800 cells/uL   Absolute Lymphocytes 2,935 850 - 3,900 cells/uL   Absolute Monocytes 1,009 (H) 200 - 950 cells/uL   Eosinophils Absolute 267 15 - 500 cells/uL   Basophils Absolute 46 0 - 200 cells/uL   Neutrophils Relative % 63.3 %   Total Lymphocyte 25.3 %   Monocytes Relative 8.7 %   Eosinophils Relative 2.3 %   Basophils Relative 0.4 %  Lipid panel     Status: Abnormal   Collection Time: 10/19/23  2:41 PM  Result Value Ref Range   Cholesterol 197 <200 mg/dL   HDL 42 (L) > OR = 50 mg/dL   Triglycerides 803 (H) <150 mg/dL   LDL Cholesterol (Calc) 123 (H) mg/dL (calc)    Comment: Reference range: <100 . Desirable range <100 mg/dL for primary prevention;   <70 mg/dL for patients with CHD  or diabetic patients  with > or = 2 CHD risk factors. SABRA LDL-C is now calculated using the Martin-Hopkins  calculation, which is a validated novel method providing  better accuracy than the Friedewald equation in the  estimation of LDL-C.  Gladis APPLETHWAITE et al. SANDREA. 7986;689(80): 2061-2068  (http://education.[facility].com/faq/FAQ164)    Total CHOL/HDL Ratio 4.7 <5.0 (calc)   Non-HDL Cholesterol (Calc) 155 (H) <130 mg/dL (calc)    Comment: For patients with diabetes plus 1 major ASCVD risk  factor, treating to a non-HDL-C goal of <100 mg/dL  (LDL-C of <29 mg/dL) is considered a therapeutic  option.   Hemoglobin A1c     Status: Abnormal   Collection Time: 10/19/23  2:41 PM  Result Value Ref Range   Hgb A1c MFr Bld 6.0 (H) <5.7 %    Comment: For someone without known diabetes, a hemoglobin  A1c value between 5.7% and 6.4% is consistent with prediabetes and should be confirmed with a  follow-up test. . For someone with known diabetes, a value <7% indicates that their diabetes is well controlled. A1c targets should be individualized based on duration of diabetes, age, comorbid conditions, and other considerations. . This assay result is consistent with an increased risk of diabetes. . Currently, no consensus exists regarding use of hemoglobin A1c for diagnosis of diabetes for children. .    Mean Plasma Glucose 126 mg/dL   eAG (mmol/L) 7.0 mmol/L  Iron, TIBC and Ferritin Panel     Status: Abnormal   Collection Time: 10/19/23  2:41 PM  Result Value Ref Range   Iron 40 (L) 45 - 160 mcg/dL   TIBC 672 749 - 549 mcg/dL (calc)   %SAT 12 (L) 16 - 45 % (calc)   Ferritin 220 16 - 232 ng/mL  Comprehensive metabolic panel with GFR     Status: None   Collection Time: 10/19/23  2:41 PM  Result Value Ref Range   Glucose, Bld 67 65 - 99 mg/dL    Comment: .            Fasting reference interval .    BUN 12 7 - 25  mg/dL   Creat 9.33 9.49 - 8.96 mg/dL   eGFR 895 > OR = 60  fO/fpw/8.26f7   BUN/Creatinine Ratio SEE NOTE: 6 - 22 (calc)    Comment:    Not Reported: BUN and Creatinine are within    reference range. .    Sodium 144 135 - 146 mmol/L   Potassium 4.0 3.5 - 5.3 mmol/L   Chloride 105 98 - 110 mmol/L   CO2 30 20 - 32 mmol/L   Calcium 9.7 8.6 - 10.4 mg/dL   Total Protein 6.5 6.1 - 8.1 g/dL   Albumin 4.1 3.6 - 5.1 g/dL   Globulin 2.4 1.9 - 3.7 g/dL (calc)   AG Ratio 1.7 1.0 - 2.5 (calc)   Total Bilirubin 0.3 0.2 - 1.2 mg/dL   Alkaline phosphatase (APISO) 69 37 - 153 U/L   AST 15 10 - 35 U/L   ALT 15 6 - 29 U/L  Hepatitis B Surface AntiBODY     Status: Abnormal   Collection Time: 10/19/23  2:41 PM  Result Value Ref Range   Hep B S Ab REACTIVE (A) NON-REACTIVE  POCT rapid strep A     Status: None   Collection Time: 11/07/23  1:19 PM  Result Value Ref Range   Rapid Strep A Screen Negative Negative  POC SARS Coronavirus 2 Ag     Status: None   Collection Time: 11/27/23  5:32 PM  Result Value Ref Range   SARS Coronavirus 2 Ag Negative Negative  POC Covid19/Flu A&B Antigen     Status: None   Collection Time: 11/28/23  2:59 PM  Result Value Ref Range   Influenza A Antigen, POC Negative Negative   Influenza B Antigen, POC Negative Negative   Covid Antigen, POC Negative Negative  POCT urinalysis dipstick     Status: Abnormal   Collection Time: 12/18/23  8:10 AM  Result Value Ref Range   Color, UA Yellow    Clarity, UA Hazy    Glucose, UA Negative Negative   Bilirubin, UA Negative    Ketones, UA Negative    Spec Grav, UA >=1.030 (A) 1.010 - 1.025   Blood, UA LArge    pH, UA 6.0 5.0 - 8.0   Protein, UA Positive (A) Negative   Urobilinogen, UA 0.2 0.2 or 1.0 E.U./dL   Nitrite, UA Negative    Leukocytes, UA Large (3+) (A) Negative   Appearance hazy    Odor foul       Assessment & Plan Urinary tract infection Acute UTI with dysuria and foul-smelling urine. Urinalysis shows hazy urine, high specific gravity, proteinuria, leukocytes.  Previous doxycycline  use was for a groin abscess. - Prescribed Ciprofloxacin 250 mg twice daily for 3 days. - Prescribed Pyridium  200 mg PRN for 24-48 hours for pain. - Sent urine for culture to confirm diagnosis and antibiotic sensitivity. - Advised to seek medical attention if symptoms worsen or if significant back pain or vomiting occurs.  Hypertension Blood pressure elevated at 136 mmHg, possibly due to UTI-related discomfort and lack of sleep. Medication taken nightly. - Recheck blood pressure before leaving the clinic. - Advised to maintain blood pressure around 130 mmHg.  General Health Maintenance Due for mammogram and flu vaccination. Administered flu shot and provided documentation for work. - Provided mammogram referral and advised to schedule it today.

## 2023-12-19 ENCOUNTER — Encounter: Payer: Self-pay | Admitting: Family Medicine

## 2023-12-20 ENCOUNTER — Other Ambulatory Visit: Payer: Self-pay

## 2023-12-20 ENCOUNTER — Ambulatory Visit: Payer: Self-pay | Admitting: Family Medicine

## 2023-12-20 LAB — URINE CULTURE
MICRO NUMBER:: 17158429
SPECIMEN QUALITY:: ADEQUATE

## 2023-12-28 ENCOUNTER — Other Ambulatory Visit: Payer: Self-pay

## 2023-12-28 DIAGNOSIS — H5213 Myopia, bilateral: Secondary | ICD-10-CM | POA: Diagnosis not present

## 2024-01-22 ENCOUNTER — Other Ambulatory Visit: Payer: Self-pay

## 2024-01-22 ENCOUNTER — Ambulatory Visit: Payer: PRIVATE HEALTH INSURANCE | Admitting: Family Medicine

## 2024-01-22 DIAGNOSIS — I10 Essential (primary) hypertension: Secondary | ICD-10-CM

## 2024-01-22 DIAGNOSIS — F325 Major depressive disorder, single episode, in full remission: Secondary | ICD-10-CM | POA: Diagnosis not present

## 2024-01-22 DIAGNOSIS — G4726 Circadian rhythm sleep disorder, shift work type: Secondary | ICD-10-CM | POA: Diagnosis not present

## 2024-01-22 DIAGNOSIS — M797 Fibromyalgia: Secondary | ICD-10-CM

## 2024-01-22 DIAGNOSIS — R7303 Prediabetes: Secondary | ICD-10-CM | POA: Diagnosis not present

## 2024-01-22 DIAGNOSIS — F338 Other recurrent depressive disorders: Secondary | ICD-10-CM

## 2024-01-22 DIAGNOSIS — G4733 Obstructive sleep apnea (adult) (pediatric): Secondary | ICD-10-CM | POA: Diagnosis not present

## 2024-01-22 DIAGNOSIS — E785 Hyperlipidemia, unspecified: Secondary | ICD-10-CM | POA: Diagnosis not present

## 2024-01-22 MED ORDER — NEBIVOLOL HCL 20 MG PO TABS
30.0000 mg | ORAL_TABLET | Freq: Every day | ORAL | 1 refills | Status: AC
  Filled 2024-01-22 – 2024-03-27 (×2): qty 135, 90d supply, fill #0

## 2024-01-22 MED ORDER — METFORMIN HCL ER 500 MG PO TB24
500.0000 mg | ORAL_TABLET | Freq: Every day | ORAL | 1 refills | Status: AC
  Filled 2024-01-22: qty 90, 90d supply, fill #0

## 2024-01-22 MED ORDER — ARMODAFINIL 150 MG PO TABS
150.0000 mg | ORAL_TABLET | Freq: Every day | ORAL | 0 refills | Status: AC | PRN
  Filled 2024-01-22: qty 90, 90d supply, fill #0

## 2024-01-22 MED ORDER — BUPROPION HCL ER (XL) 300 MG PO TB24
300.0000 mg | ORAL_TABLET | Freq: Every day | ORAL | 1 refills | Status: AC
  Filled 2024-01-22: qty 90, 90d supply, fill #0

## 2024-01-22 MED ORDER — OLMESARTAN MEDOXOMIL 40 MG PO TABS
40.0000 mg | ORAL_TABLET | Freq: Every day | ORAL | 1 refills | Status: AC
  Filled 2024-01-22: qty 90, 90d supply, fill #0

## 2024-01-22 MED ORDER — BACLOFEN 10 MG PO TABS
10.0000 mg | ORAL_TABLET | Freq: Three times a day (TID) | ORAL | 0 refills | Status: AC
  Filled 2024-01-22: qty 90, 30d supply, fill #0

## 2024-01-22 NOTE — Progress Notes (Signed)
 Name: Kathleen Conley   MRN: [id number]    DOB: 01-04-1971   Date:01/22/2024       Progress Note  Subjective  Chief Complaint  Chief Complaint  Patient presents with   Medical Management of Chronic Issues   Discussed the use of AI scribe software for clinical note transcription with the patient, who gave verbal consent to proceed.  History of Present Illness Kathleen Conley is a 53 year old female with hypertension and prediabetes who presents for a regular follow-up visit.  Her blood pressure has been consistently in the 130s/80s range at home. She is currently taking olmesartan  at the highest dose and nebivolol  20 mg daily. She cannot take diuretics due to frequent urination. She recalls a similar blood pressure reading of 136/86 mmHg in October at an urgent care visit.  She has a history of prediabetes and is taking metformin . Her last A1c was 6%, indicating prediabetes. She continues to work night shifts and reports no issues with sleep, stating she can adjust her sleep schedule easily between workdays and days off.  She has a long history of depression and seasonal affective disorder, for which she is currently taking 90 mg of an antidepressant, increased from 60 mg for the winter months. She also takes Wellbutrin  300 mg year-round. Her PHQ-9 score was negative.  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 No issues with sleep on her days off.  She has a history of fibromyalgia and finds relief with medication, which she does not take daily. She also has a history of hidradenitis suppurativa, which is currently stable.  Her BMI is over 35, and she is attempting to lose weight, noting a recent decrease from 221 to 218 pounds. She acknowledges the difficulty of weight management during the holiday season.  She has a history of dyslipidemia, managed with diet alone, as she prefers not to take cholesterol-lowering  medications. Her triglycerides are elevated, and her HDL is low, but she has no history of cardiovascular events.    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   Flat foot 01/28/2016   Aortic sclerosis 10/14/2015   Midline low back pain without sciatica 09/20/2015   Leukocytosis 11/29/2014   Osteoarthritis of left knee 11/20/2014   History of ventral hernia repair 10/27/2014   Allergic rhinitis 08/17/2014   Axillary hidradenitis suppurativa 08/17/2014   Baker cyst 08/17/2014   Chronic constipation 08/17/2014   Major depression in remission 08/17/2014   Engages in travel abroad 08/17/2014   Fibromyalgia 08/17/2014   Cardiac murmur 08/17/2014   Dysmetabolic syndrome 08/17/2014   Plantar fasciitis 08/17/2014   Beat, premature ventricular 08/17/2014   Abnormal neurological finding suggestive of lumbar-level spinal disorder 08/17/2014   Obstructive apnea 12/02/2013   Essential (primary) hypertension 12/09/2009   Hypertrichosis 12/09/2009    Past Surgical History:  Procedure Laterality Date   ACHILLES TENDON SURGERY Left 08/15/2019   Procedure: TRIPLE ARTHRODESIS AND ACHILLES TENDON LENGTHENING LEFT FOOT;  Surgeon: Ashley Soulier, DPM;  Location: [facility];  Service: Podiatry;  Laterality: Left;   COLONOSCOPY WITH PROPOFOL  N/A 07/23/2020   Procedure: COLONOSCOPY WITH PROPOFOL ;  Surgeon: Janalyn Keene NOVAK,  MD;  Location: [facility];  Service: Endoscopy;  Laterality: N/A;   FOOT ARTHRODESIS Right 12/01/2016   Procedure: ARTHRODESIS FOOT; TRIPLE;  Surgeon: Ashley Soulier, DPM;  Location: [facility];  Service: Podiatry;  Laterality: Right;   HERNIA REPAIR  10/09/11   Ventral Incarcerated- Dr. Lorrene    OOPHORECTOMY Left 12/09/2009   TUBAL LIGATION  1993   VENTRAL HERNIA REPAIR  10/09/2011    Family History  Problem Relation Age of Onset   Hypertension Mother    CVA Mother    Kidney disease Mother    Congenital heart disease Mother    Heart disease Father    Hypertension Father    Diabetes Father    Breast cancer Paternal Grandmother        Bilateral   Colon cancer Maternal Grandfather    Colon cancer Maternal Uncle     Social History   Tobacco Use   Smoking status: Never   Smokeless tobacco: Never  Substance Use Topics   Alcohol use: No    Alcohol/week: 0.0 standard drinks of alcohol     Current Outpatient Medications:    acetaminophen  (TYLENOL ) 500 MG tablet, Take 1 tablet (500 mg total) by mouth every 6 (six) hours as needed. Max of 3 grams daily or 6 pills dialy (Patient taking differently: Take 1,000 mg by mouth every 6 (six) hours as needed for moderate pain (pain score 4-6) or headache.), Disp: 30 tablet, Rfl: 0   albuterol  (VENTOLIN  HFA) 108 (90 Base) MCG/ACT inhaler, Inhale 2 puffs into the lungs every 6 (six) hours as needed for wheezing or shortness of breath., Disp: 18 g, Rfl: 0   Armodafinil  (NUVIGIL ) 150 MG tablet, Take 1 tablet (150 mg total) by mouth daily as needed (shift work--sleepiness)., Disp: 90 tablet, Rfl: 0   baclofen  (LIORESAL ) 10 MG tablet, Take 1 tablet (10 mg total) by mouth 3 (three) times daily., Disp: 90 each, Rfl: 0   buPROPion  (WELLBUTRIN  XL) 300 MG 24 hr tablet, Take 1 tablet (300 mg total) by mouth daily., Disp: 90 tablet, Rfl: 1   cetirizine (ZYRTEC) 10 MG tablet, Take 10 mg by mouth every evening., Disp: , Rfl:    Cholecalciferol  (VITAMIN D ) 2000 UNITS tablet, Take 2,000 Units by mouth every evening. , Disp: , Rfl:    docusate sodium  (COLACE) 100 MG capsule, Take 100 mg by mouth daily as needed for moderate constipation. , Disp: , Rfl:    DULoxetine  (CYMBALTA ) 30 MG capsule, Take 1 capsule (30 mg total) by mouth daily., Disp: 90  capsule, Rfl: 0   DULoxetine  (CYMBALTA ) 60 MG capsule, Take 1 capsule (60 mg total) by mouth daily., Disp: 90 capsule, Rfl: 1   ferrous sulfate  325 (65 FE) MG tablet, Take 325 mg by mouth every evening. , Disp: , Rfl:    fluticasone  (FLONASE ) 50 MCG/ACT nasal spray, Place 2 sprays into both nostrils daily as needed for allergies., Disp: 48 g, Rfl: 0   Fluticasone -Umeclidin-Vilant (TRELEGY ELLIPTA ) 200-62.5-25 MCG/ACT AEPB, Inhale 1 puff into the lungs daily., Disp: , Rfl:    metFORMIN  (GLUCOPHAGE -XR) 500 MG 24 hr tablet, Take 1 tablet (500 mg total) by mouth daily with breakfast., Disp: 90 tablet, Rfl: 0   Multiple Vitamin (MULTIVITAMIN WITH MINERALS) TABS tablet, Take 1 tablet by mouth every evening., Disp: , Rfl:    Nebivolol  HCl 20 MG TABS, Take 1 tablet (20 mg total) by mouth daily., Disp: 90 tablet, Rfl: 1   olmesartan  (BENICAR ) 40 MG tablet, Take 1 tablet (  40 mg total) by mouth daily., Disp: 90 tablet, Rfl: 1   phenazopyridine  (PYRIDIUM ) 200 MG tablet, Take 1 tablet (200 mg total) by mouth 3 (three) times daily as needed for pain., Disp: 10 tablet, Rfl: 0   Travoprost , BAK Free, (TRAVATAN ) 0.004 % SOLN ophthalmic solution, Place 1 drop into both eyes at bedtime., Disp: 7.5 mL, Rfl: 2   Travoprost , BAK Free, (TRAVATAN ) 0.004 % SOLN ophthalmic solution, Place 1 drop into both eyes at bedtime., Disp: 7.5 mL, Rfl: 0   Travoprost , BAK Free, (TRAVATAN ) 0.004 % SOLN ophthalmic solution, Place 1 drop into both eyes at bedtime., Disp: 7.5 mL, Rfl: 3  Allergies  Allergen Reactions   Lisinopril Cough        Norvasc  [Amlodipine  Besylate]    Hctz [Hydrochlorothiazide] Other (See Comments)    Burning sensation on her feet     I personally reviewed active problem list, medication list, allergies, family history with the patient/caregiver today.   ROS  Ten systems reviewed and is negative except as mentioned in HPI    Objective Physical Exam  CONSTITUTIONAL: Patient appears well-developed and  well-nourished.  No distress. HEENT: Head atraumatic, normocephalic, neck supple. CARDIOVASCULAR: Normal rate, regular rhythm and normal heart sounds.  No murmur heard. No BLE edema. PULMONARY: Effort normal and breath sounds normal. No respiratory distress. ABDOMINAL: There is no tenderness or distention. MUSCULOSKELETAL: slow gait Without gross motor or sensory deficit. PSYCHIATRIC: Patient has a normal mood and affect. behavior is normal. Judgment and thought content normal.  Vitals:   01/22/24 1501  BP: 134/84  Pulse: 83  Resp: 16  SpO2: 98%  Weight: 218 lb 9.6 oz (99.2 kg)  Height: 5' 2 (1.575 m)    Body mass index is 39.98 kg/m.  Recent Results (from the past 2160 hours)  POCT rapid strep A     Status: None   Collection Time: 11/07/23  1:19 PM  Result Value Ref Range   Rapid Strep A Screen Negative Negative  POC SARS Coronavirus 2 Ag     Status: None   Collection Time: 11/27/23  5:32 PM  Result Value Ref Range   SARS Coronavirus 2 Ag Negative Negative  POC Covid19/Flu A&B Antigen     Status: None   Collection Time: 11/28/23  2:59 PM  Result Value Ref Range   Influenza A Antigen, POC Negative Negative   Influenza B Antigen, POC Negative Negative   Covid Antigen, POC Negative Negative  POCT urinalysis dipstick     Status: Abnormal   Collection Time: 12/18/23  8:10 AM  Result Value Ref Range   Color, UA Yellow    Clarity, UA Hazy    Glucose, UA Negative Negative   Bilirubin, UA Negative    Ketones, UA Negative    Spec Grav, UA >=1.030 (A) 1.010 - 1.025   Blood, UA LArge    pH, UA 6.0 5.0 - 8.0   Protein, UA Positive (A) Negative   Urobilinogen, UA 0.2 0.2 or 1.0 E.U./dL   Nitrite, UA Negative    Leukocytes, UA Large (3+) (A) Negative   Appearance hazy    Odor foul   Urine Culture     Status: Abnormal   Collection Time: 12/18/23  8:47 AM   Specimen: Urine  Result Value Ref Range   MICRO NUMBER: [id number]    SPECIMEN QUALITY: Adequate    Sample Source URINE     STATUS: FINAL    ISOLATE 1: Escherichia coli (A)  Comment: 50,000-99,000 CFU/mL of Escherichia coli      Susceptibility   Escherichia coli - URINE CULTURE, REFLEX    AMOX/CLAVULANIC 8 Sensitive     AMPICILLIN/SULBACTAM 16 Intermediate     CEFAZOLIN * 2 Not Reportable      * For infections other than uncomplicated UTI caused by E. coli, K. pneumoniae or P. mirabilis: Cefazolin  is resistant if MIC > or = 8 mcg/mL. (Distinguishing susceptible versus intermediate for isolates with MIC < or = 4 mcg/mL requires additional testing.) For uncomplicated UTI caused by E. coli, K. pneumoniae or P. mirabilis: Cefazolin  is susceptible if MIC <32 mcg/mL and predicts susceptible to the oral agents cefaclor, cefdinir, cefpodoxime, cefprozil, cefuroxime, cephalexin  and loracarbef.     CEFTAZIDIME <=0.5 Sensitive     CEFEPIME <=0.12 Sensitive     CEFTRIAXONE <=0.25 Sensitive     CIPROFLOXACIN  <=0.06 Sensitive     LEVOFLOXACIN <=0.12 Sensitive     GENTAMICIN <=1 Sensitive     IMIPENEM <=0.25 Sensitive     MEROPENEM <=0.25 Sensitive     NITROFURANTOIN <=16 Sensitive     PIP/TAZO <=4 Sensitive     TRIMETH /SULFA * <=20 Sensitive      * For infections other than uncomplicated UTI caused by E. coli, K. pneumoniae or P. mirabilis: Cefazolin  is resistant if MIC > or = 8 mcg/mL. (Distinguishing susceptible versus intermediate for isolates with MIC < or = 4 mcg/mL requires additional testing.) For uncomplicated UTI caused by E. coli, K. pneumoniae or P. mirabilis: Cefazolin  is susceptible if MIC <32 mcg/mL and predicts susceptible to the oral agents cefaclor, cefdinir, cefpodoxime, cefprozil, cefuroxime, cephalexin  and loracarbef. Legend: S = Susceptible  I = Intermediate R = Resistant  NS = Not susceptible SDD = Susceptible Dose Dependent * = Not Tested  NR = Not Reported **NN = See Therapy Comments     Diabetic Foot Exam:     PHQ2/9:    01/22/2024    3:00 PM 12/18/2023    8:00  AM 11/07/2023    1:00 PM 10/19/2023    1:49 PM 07/13/2023   10:56 AM  Depression screen PHQ 2/9  Decreased Interest 0 0 0 0 0  Down, Depressed, Hopeless 0 0 0 0 0  PHQ - 2 Score 0 0 0 0 0  Altered sleeping 0 0 0 0 0  Tired, decreased energy 0 0 0 0 0  Change in appetite 0 0 0 0 0  Feeling bad or failure about yourself  0 0 0 0 0  Trouble concentrating 0 0 0 0 0  Moving slowly or fidgety/restless 0 0 0 0 0  Suicidal thoughts 0 0 0 0 0  PHQ-9 Score 0 0  0  0  0   Difficult doing work/chores Not difficult at all Not difficult at all Not difficult at all Not difficult at all Not difficult at all     Data saved with a previous flowsheet row definition    phq 9 is negative  Fall Risk:    01/22/2024    3:00 PM 12/18/2023    8:00 AM 11/07/2023    1:00 PM 10/19/2023    1:49 PM 07/13/2023   10:46 AM  Fall Risk   Falls in the past year? 0 0 0 0 1  Number falls in past yr: 0 0 0 0 0  Injury with Fall? 0 0  0  0  0   Risk for fall due to : No Fall Risks No Fall Risks No  Fall Risks No Fall Risks Impaired balance/gait  Follow up Falls evaluation completed Falls evaluation completed Falls evaluation completed Falls evaluation completed Falls prevention discussed;Education provided;Falls evaluation completed     Data saved with a previous flowsheet row definition      Assessment & Plan Essential hypertension Blood pressure elevated at 134/84 mmHg. Home readings in the 130s. Current regimen includes maximum dose olmesartan  and bisoprolol 20 mg. Diuretics not preferred due to frequent urination. - Prescribed bisoprolol 30 mg daily for 90 days. - Instructed to monitor heart rate and blood pressure at home. - Advised to report heart rate below 60 bpm or low blood pressure. - Scheduled follow-up in 3 months.  Morbid obesity due to excess calories BMI over 35 with recent weight decrease from 221 to 218 lbs. Challenges with portion control and holiday eating habits noted. - Encouraged continued  weight loss through portion control and mindful eating.  Prediabetes A1c is 6%, indicating prediabetes. Managed with metformin . - Continue metformin  as prescribed.  Hyperlipidemia Dyslipidemia with elevated triglycerides, low HDL, and slightly elevated LDL. No history of cardiovascular events. Prefers not to take cholesterol medication. - Continue dietary management.  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 No CPAP use due to mild severity. Sleeps well on non-working days. - Continue armodafinil  as needed for shift work. - Provided 90-day supply of armodafinil .  Depression with seasonal affective features Long-standing depression with seasonal affective disorder. Currently on 90 mg of medication during winter months, increased from 60 mg. PHQ-9 is negative, indicating good control. - Continue current medication regimen for seasonal affective disorder. - Continue Wellbutrin  300 mg year-round.  Fibromyalgia Muscle spasms, particularly in the chest. - Prescribed additional medication as needed.  Hidradenitis suppurativa Previous elevated white blood cell count likely due to inflammatory process. - Continue annual monitoring of white blood cell count.

## 2024-01-23 ENCOUNTER — Other Ambulatory Visit: Payer: Self-pay

## 2024-01-28 ENCOUNTER — Other Ambulatory Visit: Payer: Self-pay

## 2024-02-11 ENCOUNTER — Encounter: Payer: Self-pay | Admitting: Urology

## 2024-02-11 ENCOUNTER — Other Ambulatory Visit: Admitting: Urology

## 2024-02-19 ENCOUNTER — Telehealth: Payer: Self-pay

## 2024-02-19 NOTE — Telephone Encounter (Signed)
 Patient states she hasn't had a period in 5 years. She reports having spotting of dark red blood since Saturday with light cramping.

## 2024-02-27 ENCOUNTER — Encounter

## 2024-03-14 ENCOUNTER — Encounter: Payer: Self-pay | Admitting: Urology

## 2024-03-17 ENCOUNTER — Ambulatory Visit: Admitting: Obstetrics & Gynecology

## 2024-03-17 ENCOUNTER — Other Ambulatory Visit: Admitting: Urology

## 2024-03-27 ENCOUNTER — Other Ambulatory Visit: Payer: Self-pay

## 2024-03-27 ENCOUNTER — Other Ambulatory Visit (HOSPITAL_COMMUNITY): Payer: Self-pay

## 2024-03-28 ENCOUNTER — Other Ambulatory Visit: Payer: Self-pay

## 2024-04-16 ENCOUNTER — Encounter

## 2024-04-22 ENCOUNTER — Ambulatory Visit: Admitting: Family Medicine

## 2024-06-02 ENCOUNTER — Other Ambulatory Visit: Admitting: Urology
